# Patient Record
Sex: Male | Born: 1945 | Race: White | Hispanic: No | Marital: Married | Smoking: Former smoker
Health system: Southern US, Community
[De-identification: ages and names within clinical notes are randomized; demographics above are authoritative.]

## PROBLEM LIST (undated history)

## (undated) DIAGNOSIS — E119 Type 2 diabetes mellitus without complications: Secondary | ICD-10-CM

## (undated) DIAGNOSIS — G629 Polyneuropathy, unspecified: Secondary | ICD-10-CM

## (undated) DIAGNOSIS — T753XXA Motion sickness, initial encounter: Secondary | ICD-10-CM

## (undated) DIAGNOSIS — R339 Retention of urine, unspecified: Secondary | ICD-10-CM

## (undated) DIAGNOSIS — N529 Male erectile dysfunction, unspecified: Secondary | ICD-10-CM

## (undated) DIAGNOSIS — N400 Enlarged prostate without lower urinary tract symptoms: Secondary | ICD-10-CM

## (undated) DIAGNOSIS — I10 Essential (primary) hypertension: Secondary | ICD-10-CM

## (undated) HISTORY — DX: Male erectile dysfunction, unspecified: N52.9

## (undated) HISTORY — PX: CATARACT EXTRACTION: SUR2

## (undated) HISTORY — DX: Essential (primary) hypertension: I10

## (undated) HISTORY — PX: FRACTURE SURGERY: SHX138

## (undated) HISTORY — PX: BASAL CELL CARCINOMA EXCISION: SHX1214

## (undated) HISTORY — DX: Type 2 diabetes mellitus without complications: E11.9

## (undated) HISTORY — PX: EYE SURGERY: SHX253

---
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
## 2011-03-14 LAB — HM COLONOSCOPY

## 2011-03-16 ENCOUNTER — Ambulatory Visit: Payer: Self-pay | Admitting: Family Medicine

## 2011-09-27 ENCOUNTER — Ambulatory Visit: Payer: Self-pay | Admitting: Ophthalmology

## 2011-10-02 ENCOUNTER — Ambulatory Visit: Payer: Self-pay | Admitting: Ophthalmology

## 2013-02-20 HISTORY — PX: RETINAL DETACHMENT SURGERY: SHX105

## 2013-03-10 ENCOUNTER — Ambulatory Visit: Payer: Self-pay | Admitting: Ophthalmology

## 2013-03-11 ENCOUNTER — Ambulatory Visit: Payer: Self-pay | Admitting: Ophthalmology

## 2013-12-24 ENCOUNTER — Encounter: Payer: Self-pay | Admitting: General Surgery

## 2013-12-26 ENCOUNTER — Encounter: Payer: Self-pay | Admitting: *Deleted

## 2013-12-31 ENCOUNTER — Ambulatory Visit (INDEPENDENT_AMBULATORY_CARE_PROVIDER_SITE_OTHER): Payer: Medicare Other | Admitting: General Surgery

## 2013-12-31 ENCOUNTER — Encounter: Payer: Self-pay | Admitting: General Surgery

## 2013-12-31 VITALS — BP 134/70 | Ht 73.0 in | Wt 211.0 lb

## 2013-12-31 DIAGNOSIS — K4090 Unilateral inguinal hernia, without obstruction or gangrene, not specified as recurrent: Secondary | ICD-10-CM

## 2013-12-31 NOTE — Patient Instructions (Addendum)

## 2013-12-31 NOTE — Progress Notes (Signed)
Patient ID: Tyler Morgan, male   DOB: Mar 02, 1945, 68 y.o.   MRN: [id number]  Chief Complaint  Patient presents with  . Hernia    right inguinal    HPI Tyler Morgan is a 68 y.o. male.  Here for evaluation of right inguinal hernia. States he fell between floor joist about 3-4 months ago. He states that is when he noticed the bulge in the groin area. He also has some swelling in his right leg.  HPI  History reviewed. No pertinent past medical history.  Past Surgical History  Procedure Laterality Date  . Eye surgery Bilateral [id number]  . Tonsillectomy  1995  . Cataract extraction Bilateral 1986, 2014  . Retinal detachment surgery  Jan 2015  . Fracture surgery Right     as a child/ right arm    Family History  Problem Relation Age of Onset  . Cancer Father     colon    Social History History  Substance Use Topics  . Smoking status: Former Smoker -- 1.00 packs/day for 13 years    Types: Cigarettes    Quit date: 02/21/1984  . Smokeless tobacco: Never Used  . Alcohol Use: 0.0 oz/week    0 Not specified per week    Allergies  Allergen Reactions  . Acetaminophen Swelling    Swelling of liver  swelling     Current Outpatient Prescriptions  Medication Sig Dispense Refill  . amLODipine (NORVASC) 5 MG tablet Take 5 mg by mouth daily.     Marland Kitchen atenolol (TENORMIN) 100 MG tablet Take 100 mg by mouth daily.     . benazepril (LOTENSIN) 40 MG tablet Take 40 mg by mouth daily.     . calcium carbonate (OS-CAL) 600 MG TABS tablet Take 600 mg by mouth 2 (two) times daily with a meal.    . Cholecalciferol (VITAMIN D-3) 5000 UNITS TABS Take by mouth daily.     Marland Kitchen ibuprofen (ADVIL,MOTRIN) 100 MG tablet Take 100 mg by mouth every 6 (six) hours as needed for fever.    . Multiple Vitamins-Minerals (MULTIVITAMIN WITH MINERALS) tablet Take 1 tablet by mouth daily.     No current facility-administered medications for this visit.    Review of Systems Review of Systems   Constitutional: Negative.   Respiratory: Negative.   Cardiovascular: Negative.     Blood pressure 134/70, height 6\' 1"  (1.854 m), weight 211 lb (95.709 kg).  Physical Exam Physical Exam  Constitutional: He is oriented to person, place, and time. He appears well-developed and well-nourished.  Eyes: Conjunctivae are normal. No scleral icterus.  Neck: Neck supple.  Cardiovascular: Normal rate, regular rhythm and normal heart sounds.   Pulmonary/Chest: Effort normal and breath sounds normal.  Abdominal: Soft. Normal appearance and bowel sounds are normal. There is no hepatomegaly. There is no tenderness. A hernia (small to medium sized, reducible right inguinal hernia) is present. Hernia confirmed positive in the right inguinal area.  Neurological: He is alert and oriented to person, place, and time.  Skin: Skin is warm and dry.    Data Reviewed Notes reviewed  Assessment    Right inguinal hernia      Plan    Discussed right inguinal hernia repair. Procedure, open vs laparoscopy approaches, risks and benefits explained. Patient agrees. He would prefer open repair with MAC  Patient's surgery has been scheduled for 01-13-14 at [facility].  PCP:  Lattie Haw 12/31/2013, 12:57 PM

## 2014-01-05 ENCOUNTER — Ambulatory Visit: Payer: Self-pay | Admitting: General Surgery

## 2014-01-05 ENCOUNTER — Encounter: Payer: Self-pay | Admitting: General Surgery

## 2014-01-05 LAB — BASIC METABOLIC PANEL
Anion Gap: 5 — ABNORMAL LOW (ref 7–16)
BUN: 18 mg/dL (ref 7–18)
CALCIUM: 8.1 mg/dL — AB (ref 8.5–10.1)
CREATININE: 0.75 mg/dL (ref 0.60–1.30)
Chloride: 107 mmol/L (ref 98–107)
Co2: 28 mmol/L (ref 21–32)
EGFR (African American): >60
EGFR (Non-African Amer.): >60
Glucose: 173 mg/dL — ABNORMAL HIGH (ref 65–99)
Osmolality: 285 (ref 275–301)
Potassium: 4 mmol/L (ref 3.5–5.1)
Sodium: 140 mmol/L (ref 136–145)

## 2014-01-05 LAB — CBC WITH DIFFERENTIAL/PLATELET
Basophil #: 0 10*3/uL (ref 0.0–0.1)
Basophil %: 0.7 %
EOS ABS: 0.5 10*3/uL (ref 0.0–0.7)
EOS PCT: 8.8 %
HCT: 43.6 % (ref 40.0–52.0)
HGB: 14.8 g/dL (ref 13.0–18.0)
LYMPHS ABS: 1.4 10*3/uL (ref 1.0–3.6)
Lymphocyte %: 25.3 %
MCH: 29.1 pg (ref 26.0–34.0)
MCHC: 33.8 g/dL (ref 32.0–36.0)
MCV: 86 fL (ref 80–100)
Monocyte #: 0.4 x10 3/mm (ref 0.2–1.0)
Monocyte %: 7.6 %
NEUTROS ABS: 3.3 10*3/uL (ref 1.4–6.5)
Neutrophil %: 57.6 %
PLATELETS: 207 10*3/uL (ref 150–440)
RBC: 5.07 10*6/uL (ref 4.40–5.90)
RDW: 13.4 % (ref 11.5–14.5)
WBC: 5.7 10*3/uL (ref 3.8–10.6)

## 2014-01-13 ENCOUNTER — Ambulatory Visit: Payer: Self-pay | Admitting: General Surgery

## 2014-01-13 DIAGNOSIS — K4090 Unilateral inguinal hernia, without obstruction or gangrene, not specified as recurrent: Secondary | ICD-10-CM

## 2014-01-13 HISTORY — PX: HERNIA REPAIR: SHX51

## 2014-01-14 ENCOUNTER — Encounter: Payer: Self-pay | Admitting: General Surgery

## 2014-01-27 ENCOUNTER — Ambulatory Visit: Payer: Medicare Other | Admitting: General Surgery

## 2014-01-27 ENCOUNTER — Ambulatory Visit (INDEPENDENT_AMBULATORY_CARE_PROVIDER_SITE_OTHER): Payer: Self-pay | Admitting: General Surgery

## 2014-01-27 ENCOUNTER — Encounter: Payer: Self-pay | Admitting: General Surgery

## 2014-01-27 VITALS — BP 132/82 | HR 70 | Resp 16 | Ht 73.0 in | Wt 211.0 lb

## 2014-01-27 DIAGNOSIS — K4090 Unilateral inguinal hernia, without obstruction or gangrene, not specified as recurrent: Secondary | ICD-10-CM

## 2014-01-27 NOTE — Patient Instructions (Addendum)
The patient is aware to call back for any questions or concerns. Gradually increase activity. Follow up in one month.

## 2014-01-27 NOTE — Progress Notes (Signed)
Here today for postoperative visit, right inguinal hernia 01-13-14. States he is doing well. Bowels area regular. Using ibuprofen for pain.  Incision clean and intact. The repair is intact, abdomen is soft.   Gradually increase activity. Follow up in one month.

## 2014-01-29 ENCOUNTER — Encounter: Payer: Self-pay | Admitting: General Surgery

## 2014-03-02 ENCOUNTER — Ambulatory Visit (INDEPENDENT_AMBULATORY_CARE_PROVIDER_SITE_OTHER): Payer: Self-pay | Admitting: General Surgery

## 2014-03-02 ENCOUNTER — Encounter: Payer: Self-pay | Admitting: General Surgery

## 2014-03-02 VITALS — BP 126/74 | HR 74 | Resp 12 | Ht 74.0 in | Wt 207.0 lb

## 2014-03-02 DIAGNOSIS — K4090 Unilateral inguinal hernia, without obstruction or gangrene, not specified as recurrent: Secondary | ICD-10-CM

## 2014-03-02 NOTE — Progress Notes (Signed)
Here today for postoperative visit, right inguinal hernia 01-13-14. States he is doing well. Bowels area regular.  Incision clean and intact. The repair is intact, abdomen is soft.  Patient to return as needed. No restrictions.

## 2014-03-02 NOTE — Patient Instructions (Signed)
Patient to return as needed. Proper lifting techniques reviewed. 

## 2014-05-13 DIAGNOSIS — I10 Essential (primary) hypertension: Secondary | ICD-10-CM | POA: Diagnosis not present

## 2014-05-13 DIAGNOSIS — Z0000 Encounter for general adult medical examination without abnormal findings: Secondary | ICD-10-CM | POA: Diagnosis not present

## 2014-05-13 DIAGNOSIS — Z23 Encounter for immunization: Secondary | ICD-10-CM | POA: Diagnosis not present

## 2014-05-13 DIAGNOSIS — G64 Other disorders of peripheral nervous system: Secondary | ICD-10-CM | POA: Diagnosis not present

## 2014-05-13 DIAGNOSIS — Z125 Encounter for screening for malignant neoplasm of prostate: Secondary | ICD-10-CM | POA: Diagnosis not present

## 2014-05-13 DIAGNOSIS — E119 Type 2 diabetes mellitus without complications: Secondary | ICD-10-CM | POA: Diagnosis not present

## 2014-06-09 NOTE — Op Note (Signed)
PATIENT NAME:  Tyler Morgan, Tyler Morgan MR#:  [id number] DATE OF BIRTH:  02-Sep-1945  DATE OF PROCEDURE:  10/02/2011  PREOPERATIVE DIAGNOSIS:  Cataract, right eye.   POSTOPERATIVE DIAGNOSIS:  Cataract, right eye.  PROCEDURE PERFORMED:  Extracapsular cataract extraction using phacoemulsification with placement of an Alcon SN6CWS, 10.5-diopter posterior chamber lens, serial # [id number].  SURGEON:  Loura Back. Demauri Advincula, MD  ASSISTANT:  None.  ANESTHESIA:  4% lidocaine and 0.75% Marcaine in a 50/50 mixture with 10 units/mL of Hylenex added, given as a peribulbar.  ANESTHESIOLOGIST:  Dr. Kayleen Memos.   COMPLICATIONS:  None.  ESTIMATED BLOOD LOSS:  Less than 1 mL.  DESCRIPTION OF PROCEDURE:  The patient was brought to the operating room and given a peribulbar block.  The patient was then prepped and draped in the usual fashion.  The vertical rectus muscles were imbricated using 5-0 silk sutures.  These sutures were then clamped to the sterile drapes as bridle sutures.  A limbal peritomy was performed extending two clock hours and hemostasis was obtained with cautery.  A partial thickness scleral groove was made at the surgical limbus and dissected anteriorly in a lamellar dissection using an Alcon crescent knife.  The anterior chamber was entered superonasally with a Superblade and through the lamellar dissection with a 2.6 mm keratome.  DisCoVisc was used to replace the aqueous and a continuous tear capsulorrhexis was carried out.  Hydrodissection and hydrodelineation were carried out with balanced salt and a 27 gauge canula.  The nucleus was rotated to confirm the effectiveness of the hydrodissection.  Phacoemulsification was carried out using a divide-and-conquer technique.  Total ultrasound time was 4 minutes with an average power of 26.4 percent. CDE 95.33.  Irrigation/aspiration was used to remove the residual cortex.  DisCoVisc was used to inflate the capsule and the internal incision was enlarged  to 3 mm with the crescent knife.  The intraocular lens was folded and inserted into the capsular bag using the AcrySert delivery system.  Irrigation/aspiration was used to remove the residual DisCoVisc.  Miostat was injected into the anterior chamber through the paracentesis track to inflate the anterior chamber and induce miosis.  The wound was checked for leaks and none were found. The conjunctiva was closed with cautery and the bridle sutures were removed.  Two drops of 0.3% Vigamox were placed on the eye.   An eye shield was placed on the eye.  The patient was discharged to the recovery room in good condition.  ____________________________ Loura Back Ishitha Roper, MD sad:cms D: 10/02/2011 12:33:46 ET T: 10/02/2011 12:52:17 ET JOB#: [id number]  cc: Remo Lipps A. Emalina Dubreuil, MD, <Dictator> Martie Lee MD ELECTRONICALLY SIGNED 10/09/2011 13:51

## 2014-06-13 NOTE — Op Note (Signed)
PATIENT NAME:  Tyler Morgan, Tyler Morgan MR#:  [id number] DATE OF BIRTH:  [birth year]  DATE OF PROCEDURE:  01/13/2014  PREOPERATIVE DIAGNOSIS: Right inguinal hernia.   POSTOPERATIVE DIAGNOSIS: Right inguinal hernia, indirect with lipoma of cord.   OPERATION: Repair right inguinal hernia with mesh.   ANESTHESIA: Local with sedation 30 mL of 0.5% Marcaine mixed with 1% Xylocaine used.   COMPLICATIONS: None.   ESTIMATED BLOOD LOSS: Less than 20 mL.   DRAINS: None.   DESCRIPTION OF PROCEDURE: The patient was placed in the supine position on the operating table. With adequate sedation and monitoring, the right inguinal area was prepped and draped out as a sterile field. Timeout was performed. A local anesthetic was instilled along the inguinal canal, and incision made along the medial two-thirds. This was then dissected down to the external oblique and bleeding controlled with cautery and ligatures of 3-0 Vicryl. The inguinal canal was instilled local anesthetic and allowed to spread evenly. The external oblique was opened along the line of its fibers through the external ring area. The ilioinguinal nerve was identified, freed, and preserved. A thick cord was identified, which was then dissected free from the posterior wall and lifted out and controlled with a Penrose drain. The fascia covering the cord was then opened longitudinally and there were 2 findings, one was an indirect sac and the other lipomatous mass. The lipoma was freed all the way down to the internal ring area where it was clamped, amputated, and ligated with 2-0 Vicryl. The sac was dissected free all the way down to the internal ring and beyond to allow for high ligation. It was opened, no contents were noted. Under direct vision, a suture ligation at a high level of the sac was performed and the sac amputated. The ligating suture was used to transfix this to the undersurface of the arching fibers of the internal oblique. Following this, the  fascia covering the cord structures was reapproximated with interrupted 2-0 Vicryl stitches. The posterior wall was satisfactorily delineated. The inguinal nerve was brought up along with the cord and a Parietex ProGrip mesh was then placed around the cord and laid down to cover the posterior wall and to reinforce the repair. The medial end was tacked to the pubic tubercle with a 2-0 PDS stitch. The area was irrigated and the lateral end of the mesh was tucked under the external oblique. The external oblique was closed with running 2-0 PDS. The subcutaneous tissue was closed with 3-0 Vicryl, and the skin closed with subcuticular 4-0 Vicryl covered with LiquiBand. Patient subsequently was returned to the recovery room in stable condition    ____________________________ S.Robinette Haines, MD sgs:bm D: 01/13/2014 17:52:52 ET T: 01/13/2014 23:26:05 ET JOB#: [id number]  cc: S.G. Jamal Collin, MD, <Dictator> [facility] Robinette Haines MD ELECTRONICALLY SIGNED 01/14/2014 19:28

## 2014-06-13 NOTE — Op Note (Signed)
PATIENT NAME:  Tyler Morgan, Tyler Morgan MR#:  [id number] DATE OF BIRTH:  1946-01-16  DATE OF PROCEDURE:  03/11/2013  PROCEDURE PERFORMED: 1.  Pars plana vitrectomy of the right eye.  2.  Gas exchange of the right eye.  3.  Endolaser of the right eye.   PREOPERATIVE DIAGNOSES: Rhegmatogenous retinal detachment of the right eye.   POSTOPERATIVE DIAGNOSIS: Rhegmatogenous retinal detachment of the right eye.  ESTIMATED BLOOD LOSS: Less than 1 mL.  PRIMARY SURGEON: Garlan Fair, M.D.   ANESTHESIA: Retrobulbar block of the right eye with monitored anesthesia care.   COMPLICATIONS: None.   INDICATIONS FOR PROCEDURE: The patient was in my office with a curtain in his right eye. Examination revealed a rhegmatogenous retinal detachment of the right eye. The risks, benefits, and alternatives of the above procedure were discussed and the patient wished to proceed.   DETAILS OF PROCEDURE: After informed consent was obtained, the patient was brought to the operative suite at [facility]. The patient was placed in supine position, was given a small dose of Alfenta and a retrobulbar block was performed on the right eye by the primary surgeon without any complications. The right eye was prepped and draped in sterile manner. After lid speculum was inserted, a 25-gauge trocar was placed inferotemporally through displaced conjunctiva in an oblique fashion 3 mm beyond the limbus. The infusion cannula was turned on and inserted through the trocar and secured in position with Steri-Strips. Two more trocars were placed in a similar fashion superotemporally and superonasally. The vitreous cutter and light pipe were introduced into the eye and a core vitrectomy was performed. Peripheral vitreous was trimmed for 360 degrees with care taken over the area of retinal detachment. A single retinal tear associated with a retinal detachment was identified at approximately 1:00. The flap of this tear was  amputated and peripheral vitreous was trimmed. Another tear was identified without subretinal fluid at approximately 4:30. This was marked using Endo cautery. The original retinal tear was marked using Endo cautery. A posterior draining retinotomy was created at approximately 1:30. The endolaser was introduced and a ring of 3 rows of the laser was placed just posterior to the ora serrata for the area of A patch retina. An air-fluid exchange was performed through the draining retinotomy and the retina completely flattened. Remnant fluid was removed from the posterior. Four rows of laser were placed around the original retinal tear and laser was carried for 3 rows posterior to the ora serrata for the rest of the area of retinal detachment. Any remnant collected fluid was removed from the posterior draining retinotomy, and 3 to 4 rows of laser was placed around that. 24% SF6 was used as an air gas exchange and the trocars were removed. The wounds were noted to be airtight. Pressure in the eye was confirmed to be approximately 15 mmHg. Dexamethasone 5 mg was given into the inferior fornix. The lid speculum was removed and the eye was cleaned. TobraDex was placed in the eye and a patch and shield was placed over the eye. The patient was taken to postanesthesia care with instructions to remain right side down.    ____________________________ Teresa Pelton. Starling Manns, MD mfa:aw D: 03/11/2013 08:03:02 ET T: 03/11/2013 11:14:23 ET JOB#: [id number]  cc: Teresa Pelton. Starling Manns, MD, <Dictator> Coralee Rud MD ELECTRONICALLY SIGNED 03/12/2013 7:11

## 2014-10-01 DIAGNOSIS — L821 Other seborrheic keratosis: Secondary | ICD-10-CM | POA: Diagnosis not present

## 2014-10-01 DIAGNOSIS — D485 Neoplasm of uncertain behavior of skin: Secondary | ICD-10-CM | POA: Diagnosis not present

## 2014-10-01 DIAGNOSIS — L918 Other hypertrophic disorders of the skin: Secondary | ICD-10-CM | POA: Diagnosis not present

## 2014-10-01 DIAGNOSIS — B078 Other viral warts: Secondary | ICD-10-CM | POA: Diagnosis not present

## 2014-10-01 DIAGNOSIS — D2372 Other benign neoplasm of skin of left lower limb, including hip: Secondary | ICD-10-CM | POA: Diagnosis not present

## 2014-11-03 ENCOUNTER — Other Ambulatory Visit: Payer: Self-pay | Admitting: Family Medicine

## 2014-11-03 NOTE — Telephone Encounter (Signed)
Needs an appointment. Will get him enough medicine to make it to appointment when it's booked.   

## 2014-11-12 DIAGNOSIS — B078 Other viral warts: Secondary | ICD-10-CM | POA: Diagnosis not present

## 2014-11-12 DIAGNOSIS — Z789 Other specified health status: Secondary | ICD-10-CM | POA: Diagnosis not present

## 2014-11-12 DIAGNOSIS — D485 Neoplasm of uncertain behavior of skin: Secondary | ICD-10-CM | POA: Diagnosis not present

## 2014-12-11 DIAGNOSIS — I152 Hypertension secondary to endocrine disorders: Secondary | ICD-10-CM | POA: Insufficient documentation

## 2014-12-11 DIAGNOSIS — I10 Essential (primary) hypertension: Secondary | ICD-10-CM | POA: Insufficient documentation

## 2014-12-15 ENCOUNTER — Ambulatory Visit (INDEPENDENT_AMBULATORY_CARE_PROVIDER_SITE_OTHER): Payer: Medicare Other | Admitting: Family Medicine

## 2014-12-15 ENCOUNTER — Encounter: Payer: Self-pay | Admitting: Family Medicine

## 2014-12-15 ENCOUNTER — Telehealth: Payer: Self-pay | Admitting: Family Medicine

## 2014-12-15 VITALS — BP 114/74 | HR 64 | Temp 97.7°F | Ht 71.6 in | Wt 198.0 lb

## 2014-12-15 DIAGNOSIS — I10 Essential (primary) hypertension: Secondary | ICD-10-CM | POA: Diagnosis not present

## 2014-12-15 DIAGNOSIS — E119 Type 2 diabetes mellitus without complications: Secondary | ICD-10-CM

## 2014-12-15 DIAGNOSIS — E1140 Type 2 diabetes mellitus with diabetic neuropathy, unspecified: Secondary | ICD-10-CM | POA: Insufficient documentation

## 2014-12-15 DIAGNOSIS — Z23 Encounter for immunization: Secondary | ICD-10-CM | POA: Diagnosis not present

## 2014-12-15 DIAGNOSIS — M6281 Muscle weakness (generalized): Secondary | ICD-10-CM

## 2014-12-15 DIAGNOSIS — E1349 Other specified diabetes mellitus with other diabetic neurological complication: Secondary | ICD-10-CM | POA: Insufficient documentation

## 2014-12-15 DIAGNOSIS — E1365 Other specified diabetes mellitus with hyperglycemia: Secondary | ICD-10-CM

## 2014-12-15 LAB — BAYER DCA HB A1C WAIVED: HB A1C: 6.4 % (ref <7.0)

## 2014-12-15 MED ORDER — BENAZEPRIL HCL 40 MG PO TABS: 40.0000 mg | ORAL_TABLET | Freq: Every day | ORAL | Status: DC

## 2014-12-15 MED ORDER — ATENOLOL 100 MG PO TABS: 100.0000 mg | ORAL_TABLET | Freq: Every day | ORAL | Status: DC

## 2014-12-15 MED ORDER — HYDROCHLOROTHIAZIDE 25 MG PO TABS: 25.0000 mg | ORAL_TABLET | Freq: Every day | ORAL | Status: DC

## 2014-12-15 MED ORDER — METFORMIN HCL 500 MG PO TABS: 500.0000 mg | ORAL_TABLET | Freq: Every day | ORAL | Status: DC

## 2014-12-15 NOTE — Assessment & Plan Note (Addendum)
Discuss with chart review vitamin levels from this spring and blood work was negative including STS Will get lumbar x-ray if normal or if indicated MRI of lumbar spine If no evidence of spinal stenosis will refer to neurology, or neurosurgery if spinal stenosis Concerned about lumbar fracture with history of fall. Check BMP Cautioned patient about falling and coordination

## 2014-12-15 NOTE — Assessment & Plan Note (Signed)
The current medical regimen is effective;  continue present plan and medications.  

## 2014-12-15 NOTE — Progress Notes (Signed)
BP 114/74 mmHg  Pulse 64  Temp(Src) 97.7 F (36.5 C)  Ht 5' 11.6" (1.819 m)  Wt 198 lb (89.812 kg)  BMI 27.14 kg/m2  SpO2 97%   Subjective:    Patient ID: Tyler Morgan, male    DOB: 06-Sep-1945, 69 y.o.   MRN: [id number]  HPI: DENO SIDA is a 69 y.o. male  Chief Complaint  Patient presents with  . Diabetes   Patient doing fine from diabetes point of view with no complaints noted low blood sugar spells and doing well. Blood pressure no complaints doing well.  On chart review and further discussion with patient bilateral calf weakness which was brought up that last visit has not changed. Patient still with bilateral calf weakness came raise up on his toes. Is recommended the patient did not go to physical therapy there's been no improvement or worsening symptoms. There is no numbness or swelling complaints. There is no complaints of back pain, or radicular pain. Of significance on review of our chart RPR, X32, folic acid and other labs were negative for causes of neuropathy. Patient relates symptoms may have started around the time of this hernia surgery. Reviewed surgical notes from November 2015 with standard inguinal hernia repairsignificant associated. A significant note though is 3 months prior to surgery patient fell through the rafters which subsequent caused his hernia. There is no noted complaints of back pain.  Relevant past medical, surgical, family and social history reviewed and updated as indicated. Interim medical history since our last visit reviewed. Allergies and medications reviewed and updated.  Review of Systems  Constitutional: Negative.   HENT: Negative.   Eyes: Negative.   Respiratory: Negative.   Cardiovascular: Negative.   Gastrointestinal: Negative.   Endocrine: Negative.   Genitourinary: Negative for dysuria, frequency, hematuria, flank pain, scrotal swelling, enuresis, difficulty urinating and testicular pain.  Musculoskeletal:  Positive for gait problem. Negative for myalgias, back pain, joint swelling and arthralgias.  Skin: Negative.   Neurological: Negative for dizziness, tremors, seizures, syncope, facial asymmetry, light-headedness, numbness and headaches.  Psychiatric/Behavioral: Negative.     Per HPI unless specifically indicated above     Objective:    BP 114/74 mmHg  Pulse 64  Temp(Src) 97.7 F (36.5 C)  Ht 5' 11.6" (1.819 m)  Wt 198 lb (89.812 kg)  BMI 27.14 kg/m2  SpO2 97%  Wt Readings from Last 3 Encounters:  12/15/14 198 lb (89.812 kg)  05/13/14 202 lb (91.627 kg)  03/02/14 207 lb (93.895 kg)    Physical Exam  Constitutional: He is oriented to person, place, and time. He appears well-developed and well-nourished. No distress.  HENT:  Head: Normocephalic and atraumatic.  Right Ear: Hearing normal.  Left Ear: Hearing normal.  Nose: Nose normal.  Mouth/Throat: Oropharynx is clear and moist.  Eyes: Conjunctivae and lids are normal. Right eye exhibits no discharge. Left eye exhibits no discharge. No scleral icterus.  Neck: Normal range of motion. Neck supple. No thyromegaly present.  Cardiovascular: Normal rate, regular rhythm and normal heart sounds.   Pulmonary/Chest: Effort normal. No respiratory distress. He has no rales.  Abdominal: Soft. Bowel sounds are normal. He exhibits no distension. There is no tenderness.  Musculoskeletal: Normal range of motion.  Straight leg raising normal with no symptoms Normal quadriceps strength Markedly deficient calf muscle unable to stand on tiptoes either with both feet are 1 foot With Babinski patient fall slightly to the right  Lymphadenopathy:    He has no cervical adenopathy.  Neurological: He is alert and oriented to person, place, and time. No cranial nerve deficit.  Cranial nerves intact Patellar reflexes normal   Skin: Skin is intact. No rash noted.  Psychiatric: He has a normal mood and affect. His speech is normal and behavior is  normal. Judgment and thought content normal. Cognition and memory are normal.        Assessment & Plan:   Problem List Items Addressed This Visit      Cardiovascular and Mediastinum   Hypertension    The current medical regimen is effective;  continue present plan and medications.       Relevant Medications   atenolol (TENORMIN) 100 MG tablet   benazepril (LOTENSIN) 40 MG tablet   hydrochlorothiazide (HYDRODIURIL) 25 MG tablet   Other Relevant Orders   Basic metabolic panel     Endocrine   DM type 2 (diabetes mellitus, type 2) ([place])    The current medical regimen is effective;  continue present plan and medications.       Relevant Medications   benazepril (LOTENSIN) 40 MG tablet   metFORMIN (GLUCOPHAGE) 500 MG tablet   Other Relevant Orders   Bayer DCA Hb A1c Waived     Other   Calf muscle weakness    Discuss with chart review vitamin levels from this spring and blood work was negative including STS Will get lumbar x-ray if normal or if indicated MRI of lumbar spine If no evidence of spinal stenosis will refer to neurology, or neurosurgery if spinal stenosis Concerned about lumbar fracture with history of fall. Check BMP Cautioned patient about falling and coordination      Relevant Orders   DG Lumbar Spine Complete    Other Visit Diagnoses    Immunization due    -  Primary    Relevant Orders    Flu Vaccine QUAD 36+ mos PF IM (Fluarix & Fluzone Quad PF) (Completed)        Follow up plan: Return in about 6 months (around 06/15/2015) for Physical Exam and a1c.

## 2014-12-16 ENCOUNTER — Encounter: Payer: Self-pay | Admitting: Family Medicine

## 2014-12-16 LAB — BASIC METABOLIC PANEL
BUN / CREAT RATIO: 17 (ref 10–22)
BUN: 16 mg/dL (ref 8–27)
CHLORIDE: 100 mmol/L (ref 97–106)
CO2: 27 mmol/L (ref 18–29)
CREATININE: 0.92 mg/dL (ref 0.76–1.27)
Calcium: 9.2 mg/dL (ref 8.6–10.2)
GFR calc Af Amer: 98 mL/min/{1.73_m2} (ref >59)
GFR calc non Af Amer: 85 mL/min/{1.73_m2} (ref >59)
GLUCOSE: 138 mg/dL — AB (ref 65–99)
POTASSIUM: 4 mmol/L (ref 3.5–5.2)
SODIUM: 141 mmol/L (ref 136–144)

## 2015-04-22 ENCOUNTER — Telehealth: Payer: Self-pay

## 2015-04-22 DIAGNOSIS — E119 Type 2 diabetes mellitus without complications: Secondary | ICD-10-CM

## 2015-04-22 MED ORDER — METFORMIN HCL 500 MG PO TABS: 500.0000 mg | ORAL_TABLET | Freq: Two times a day (BID) | ORAL | Status: DC

## 2015-04-22 NOTE — Telephone Encounter (Signed)
[facility] requesting 90 day RX for  Metformin HCL 500MG  TAb BID  To help with patient compliance with meds

## 2015-05-14 ENCOUNTER — Telehealth: Payer: Self-pay

## 2015-05-14 DIAGNOSIS — I10 Essential (primary) hypertension: Secondary | ICD-10-CM

## 2015-05-14 NOTE — Telephone Encounter (Signed)
Pharmacy sent a fax asking if they could get a 90 day prescription for patient's benazepril 40 mg tablet to help with compliance. Pharmacy is [facility].

## 2015-05-17 MED ORDER — BENAZEPRIL HCL 40 MG PO TABS: 40.0000 mg | ORAL_TABLET | Freq: Every day | ORAL | Status: DC

## 2015-05-19 ENCOUNTER — Encounter: Payer: Medicare Other | Admitting: Family Medicine

## 2015-07-21 ENCOUNTER — Ambulatory Visit (INDEPENDENT_AMBULATORY_CARE_PROVIDER_SITE_OTHER): Payer: Medicare Other | Admitting: Family Medicine

## 2015-07-21 ENCOUNTER — Encounter: Payer: Self-pay | Admitting: Family Medicine

## 2015-07-21 VITALS — BP 133/83 | HR 64 | Temp 98.0°F | Ht 72.0 in | Wt 203.0 lb

## 2015-07-21 DIAGNOSIS — M6281 Muscle weakness (generalized): Secondary | ICD-10-CM

## 2015-07-21 DIAGNOSIS — Z0000 Encounter for general adult medical examination without abnormal findings: Secondary | ICD-10-CM

## 2015-07-21 DIAGNOSIS — E119 Type 2 diabetes mellitus without complications: Secondary | ICD-10-CM | POA: Diagnosis not present

## 2015-07-21 DIAGNOSIS — Z23 Encounter for immunization: Secondary | ICD-10-CM | POA: Diagnosis not present

## 2015-07-21 DIAGNOSIS — I10 Essential (primary) hypertension: Secondary | ICD-10-CM | POA: Diagnosis not present

## 2015-07-21 DIAGNOSIS — Z13818 Encounter for screening for other digestive system disorders: Secondary | ICD-10-CM | POA: Diagnosis not present

## 2015-07-21 LAB — URINALYSIS, ROUTINE W REFLEX MICROSCOPIC
BILIRUBIN UA: NEGATIVE
KETONES UA: NEGATIVE
Leukocytes, UA: NEGATIVE
Nitrite, UA: NEGATIVE
Protein, UA: NEGATIVE
RBC UA: NEGATIVE
SPEC GRAV UA: 1.02 (ref 1.005–1.030)
UUROB: 0.2 mg/dL (ref 0.2–1.0)
pH, UA: 5.5 (ref 5.0–7.5)

## 2015-07-21 LAB — HEMOGLOBIN A1C: HEMOGLOBIN A1C: 8.2

## 2015-07-21 LAB — BAYER DCA HB A1C WAIVED: HB A1C: 8.2 % — AB (ref <7.0)

## 2015-07-21 MED ORDER — ATENOLOL 100 MG PO TABS
100.0000 mg | ORAL_TABLET | Freq: Every day | ORAL | Status: DC
Start: 2015-07-21 — End: 2016-07-18

## 2015-07-21 MED ORDER — HYDROCHLOROTHIAZIDE 25 MG PO TABS: 25.0000 mg | ORAL_TABLET | Freq: Every day | ORAL | Status: DC

## 2015-07-21 MED ORDER — METFORMIN HCL 500 MG PO TABS: 1000.0000 mg | ORAL_TABLET | Freq: Two times a day (BID) | ORAL | Status: DC

## 2015-07-21 MED ORDER — BENAZEPRIL HCL 40 MG PO TABS: 40.0000 mg | ORAL_TABLET | Freq: Every day | ORAL | Status: DC

## 2015-07-21 NOTE — Patient Instructions (Signed)
Pneumococcal Polysaccharide Vaccine: What You Need to Know  1. Why get vaccinated?  Vaccination can protect older adults (and some children and younger adults) from pneumococcal disease.  Pneumococcal disease is caused by bacteria that can spread from person to person through close contact. It can cause ear infections, and it can also lead to more serious infections of the:   · Lungs (pneumonia),  · Blood (bacteremia), and  · Covering of the brain and spinal cord (meningitis). Meningitis can cause deafness and brain damage, and it can be fatal.  Anyone can get pneumococcal disease, but children under 2 years of age, people with certain medical conditions, adults over 65 years of age, and cigarette smokers are at the highest risk.  About 18,000 older adults die each year from pneumococcal disease in the [place].  Treatment of pneumococcal infections with penicillin and other drugs used to be more effective. But some strains of the disease have become resistant to these drugs. This makes prevention of the disease, through vaccination, even more important.  2. Pneumococcal polysaccharide vaccine (PPSV23)  Pneumococcal polysaccharide vaccine (PPSV23) protects against 23 types of pneumococcal bacteria. It will not prevent all pneumococcal disease.  PPSV23 is recommended for:  · All adults 65 years of age and older,  · Anyone 2 through 70 years of age with certain long-term health problems,  · Anyone 2 through 70 years of age with a weakened immune system,  · Adults 19 through 70 years of age who smoke cigarettes or have asthma.  Most people need only one dose of PPSV. A second dose is recommended for certain high-risk groups. People 65 and older should get a dose even if they have gotten one or more doses of the vaccine before they turned 65.  Your healthcare provider can give you more information about these recommendations.  Most healthy adults develop protection within 2 to 3 weeks of getting the shot.  3. Some  people should not get this vaccine  · Anyone who has had a life-threatening allergic reaction to PPSV should not get another dose.  · Anyone who has a severe allergy to any component of PPSV should not receive it. Tell your provider if you have any severe allergies.  · Anyone who is moderately or severely ill when the shot is scheduled may be asked to wait until they recover before getting the vaccine. Someone with a mild illness can usually be vaccinated.  · Children less than 2 years of age should not receive this vaccine.  · There is no evidence that PPSV is harmful to either a pregnant woman or to her fetus. However, as a precaution, women who need the vaccine should be vaccinated before becoming pregnant, if possible.  4. Risks of a vaccine reaction  With any medicine, including vaccines, there is a chance of side effects. These are usually mild and go away on their own, but serious reactions are also possible.  About half of people who get PPSV have mild side effects, such as redness or pain where the shot is given, which go away within about two days.  Less than 1 out of 100 people develop a fever, muscle aches, or more severe local reactions.  Problems that could happen after any vaccine:  · People sometimes faint after a medical procedure, including vaccination. Sitting or lying down for about 15 minutes can help prevent fainting, and injuries caused by a fall. Tell your doctor if you feel dizzy, or have vision changes or   ringing in the ears.  · Some people get severe pain in the shoulder and have difficulty moving the arm where a shot was given. This happens very rarely.  · Any medication can cause a severe allergic reaction. Such reactions from a vaccine are very rare, estimated at about 1 in a million doses, and would happen within a few minutes to a few hours after the vaccination.  As with any medicine, there is a very remote chance of a vaccine causing a serious injury or death.  The safety of  vaccines is always being monitored. For more information, visit: www.cdc.gov/vaccinesafety/  5. What if there is a serious reaction?  What should I look for?  Look for anything that concerns you, such as signs of a severe allergic reaction, very high fever, or unusual behavior.   Signs of a severe allergic reaction can include hives, swelling of the face and throat, difficulty breathing, a fast heartbeat, dizziness, and weakness. These would usually start a few minutes to a few hours after the vaccination.  What should I do?  If you think it is a severe allergic reaction or other emergency that can't wait, call 9-1-1 or get to the nearest hospital. Otherwise, call your doctor.  Afterward, the reaction should be reported to the Vaccine Adverse Event Reporting System (VAERS). Your doctor might file this report, or you can do it yourself through the VAERS web site at www.vaers.hhs.gov, or by calling 1-800-822-7967.   VAERS does not give medical advice.  6. How can I learn more?  · Ask your doctor. He or she can give you the vaccine package insert or suggest other sources of information.  · Call your local or state health department.  · Contact the Centers for Disease Control and Prevention (CDC):    Call 1-800-232-4636 (1-800-CDC-INFO) or    Visit CDC's website at www.cdc.gov/vaccines  CDC Pneumococcal Polysaccharide Vaccine VIS (06/13/13)     This information is not intended to replace advice given to you by your health care provider. Make sure you discuss any questions you have with your health care provider.     Document Released: 12/04/2005 Document Revised: 02/27/2014 Document Reviewed: 06/16/2013  Elsevier Interactive Patient Education ©2016 [employer]

## 2015-07-21 NOTE — Assessment & Plan Note (Signed)
Poor control of diabetes discussed diet exercise nutrition Will increase metformin to up to a thousand milligrams twice a day

## 2015-07-21 NOTE — Assessment & Plan Note (Signed)
The current medical regimen is effective;  continue present plan and medications.  

## 2015-07-21 NOTE — Assessment & Plan Note (Signed)
No further weakness or progression

## 2015-07-21 NOTE — Progress Notes (Signed)
BP 133/83 mmHg  Pulse 64  Temp(Src) 98 F (36.7 C)  Ht 6' (1.829 m)  Wt 203 lb (92.08 kg)  BMI 27.53 kg/m2  SpO2 97%   Subjective:    Patient ID: Tyler Morgan, male    DOB: 03/18/1945, 70 y.o.   MRN: [id number]  HPI: FREDERIK HARTSOCK is a 70 y.o. male  Chief Complaint  Patient presents with  . Annual Exam   Calf muscle weakness is stable may be better not because calf muscles are gotten stronger but other accommodation patient's balance issues are better. Diabetes no changes no issues no concerns noted low blood sugar spells no problems with medications. Blood pressure doing well no complaints from medication takes faithfully  Relevant past medical, surgical, family and social history reviewed and updated as indicated. Interim medical history since our last visit reviewed. Allergies and medications reviewed and updated.  Review of Systems  Constitutional: Negative.   HENT: Negative.   Eyes: Negative.   Respiratory: Negative.   Cardiovascular: Negative.   Gastrointestinal: Negative.   Endocrine: Negative.   Genitourinary: Negative.   Musculoskeletal: Negative.   Skin: Negative.   Allergic/Immunologic: Negative.   Neurological: Negative.   Hematological: Negative.   Psychiatric/Behavioral: Negative.     Per HPI unless specifically indicated above     Objective:    BP 133/83 mmHg  Pulse 64  Temp(Src) 98 F (36.7 C)  Ht 6' (1.829 m)  Wt 203 lb (92.08 kg)  BMI 27.53 kg/m2  SpO2 97%  Wt Readings from Last 3 Encounters:  07/21/15 203 lb (92.08 kg)  12/15/14 198 lb (89.812 kg)  05/13/14 202 lb (91.627 kg)    Physical Exam  Constitutional: He is oriented to person, place, and time. He appears well-developed and well-nourished.  HENT:  Head: Normocephalic and atraumatic.  Right Ear: External ear normal.  Left Ear: External ear normal.  Eyes: Conjunctivae and EOM are normal. Pupils are equal, round, and reactive to light.  Neck: Normal range of  motion. Neck supple.  Cardiovascular: Normal rate, regular rhythm, normal heart sounds and intact distal pulses.   Pulmonary/Chest: Effort normal and breath sounds normal.  Abdominal: Soft. Bowel sounds are normal. There is no splenomegaly or hepatomegaly.  Genitourinary: Rectum normal, prostate normal and penis normal.  Musculoskeletal: Normal range of motion.  Neurological: He is alert and oriented to person, place, and time. He has normal reflexes.  Skin: No rash noted. No erythema.  Psychiatric: He has a normal mood and affect. His behavior is normal. Judgment and thought content normal.    Results for orders placed or performed in visit on 12/15/14  Bayer DCA Hb A1c Waived  Result Value Ref Range   Bayer DCA Hb A1c Waived 6.4 Q000111Q %  Basic metabolic panel  Result Value Ref Range   Glucose 138 (H) 65 - 99 mg/dL   BUN 16 8 - 27 mg/dL   Creatinine, Ser 0.92 0.76 - 1.27 mg/dL   GFR calc non Af Amer 85 >59 mL/min/1.73   GFR calc Af Amer 98 >59 mL/min/1.73   BUN/Creatinine Ratio 17 10 - 22   Sodium 141 136 - 144 mmol/L   Potassium 4.0 3.5 - 5.2 mmol/L   Chloride 100 97 - 106 mmol/L   CO2 27 18 - 29 mmol/L   Calcium 9.2 8.6 - 10.2 mg/dL      Assessment & Plan:   Problem List Items Addressed This Visit      Cardiovascular and Mediastinum  Hypertension    The current medical regimen is effective;  continue present plan and medications.       Relevant Medications   atenolol (TENORMIN) 100 MG tablet   benazepril (LOTENSIN) 40 MG tablet   hydrochlorothiazide (HYDRODIURIL) 25 MG tablet     Endocrine   DM type 2 (diabetes mellitus, type 2) (H[facility])    Poor control of diabetes discussed diet exercise nutrition Will increase metformin to up to a thousand milligrams twice a day      Relevant Medications   benazepril (LOTENSIN) 40 MG tablet   metFORMIN (GLUCOPHAGE) 500 MG tablet     Other   Calf muscle weakness    No further weakness or progression       Other Visit  Diagnoses    Routine general medical examination at a health care facility    -  Primary    Relevant Orders    CBC with Differential/Platelet    Comprehensive metabolic panel    Lipid Panel w/o Chol/HDL Ratio    PSA    TSH    Urinalysis, Routine w reflex microscopic (not at [facility])    Diabetes mellitus without complication ([place])        Relevant Medications    benazepril (LOTENSIN) 40 MG tablet    metFORMIN (GLUCOPHAGE) 500 MG tablet    Other Relevant Orders    Bayer DCA Hb A1c Waived    Healthcare maintenance        Relevant Orders    Hepatitis C Antibody    Immunization due        Relevant Orders    Pneumococcal polysaccharide vaccine 23-valent greater than or equal to 2yo subcutaneous/IM (Completed)        Follow up plan: Return in about 3 months (around 10/21/2015) for a1c.

## 2015-07-22 ENCOUNTER — Telehealth: Payer: Self-pay | Admitting: Family Medicine

## 2015-07-22 DIAGNOSIS — R972 Elevated prostate specific antigen [PSA]: Secondary | ICD-10-CM

## 2015-07-22 LAB — CBC WITH DIFFERENTIAL/PLATELET
Basophils Absolute: 0 10*3/uL (ref 0.0–0.2)
Basos: 0 %
EOS (ABSOLUTE): 0.4 10*3/uL (ref 0.0–0.4)
EOS: 7 %
HEMATOCRIT: 44.4 % (ref 37.5–51.0)
HEMOGLOBIN: 14.6 g/dL (ref 12.6–17.7)
Immature Grans (Abs): 0 10*3/uL (ref 0.0–0.1)
Immature Granulocytes: 0 %
LYMPHS ABS: 1.3 10*3/uL (ref 0.7–3.1)
Lymphs: 24 %
MCH: 28.7 pg (ref 26.6–33.0)
MCHC: 32.9 g/dL (ref 31.5–35.7)
MCV: 87 fL (ref 79–97)
MONOCYTES: 8 %
Monocytes Absolute: 0.4 10*3/uL (ref 0.1–0.9)
NEUTROS ABS: 3.2 10*3/uL (ref 1.4–7.0)
Neutrophils: 61 %
Platelets: 188 10*3/uL (ref 150–379)
RBC: 5.08 x10E6/uL (ref 4.14–5.80)
RDW: 14.8 % (ref 12.3–15.4)
WBC: 5.3 10*3/uL (ref 3.4–10.8)

## 2015-07-22 LAB — COMPREHENSIVE METABOLIC PANEL
ALBUMIN: 4.4 g/dL (ref 3.6–4.8)
ALK PHOS: 51 IU/L (ref 39–117)
ALT: 24 IU/L (ref 0–44)
AST: 22 IU/L (ref 0–40)
Albumin/Globulin Ratio: 1.9 (ref 1.2–2.2)
BUN / CREAT RATIO: 18 (ref 10–24)
BUN: 17 mg/dL (ref 8–27)
Bilirubin Total: 0.5 mg/dL (ref 0.0–1.2)
CO2: 24 mmol/L (ref 18–29)
CREATININE: 0.94 mg/dL (ref 0.76–1.27)
Calcium: 9 mg/dL (ref 8.6–10.2)
Chloride: 99 mmol/L (ref 96–106)
GFR, EST AFRICAN AMERICAN: 95 mL/min/{1.73_m2} (ref >59)
GFR, EST NON AFRICAN AMERICAN: 82 mL/min/{1.73_m2} (ref >59)
GLOBULIN, TOTAL: 2.3 g/dL (ref 1.5–4.5)
Glucose: 180 mg/dL — ABNORMAL HIGH (ref 65–99)
Potassium: 4 mmol/L (ref 3.5–5.2)
SODIUM: 139 mmol/L (ref 134–144)
TOTAL PROTEIN: 6.7 g/dL (ref 6.0–8.5)

## 2015-07-22 LAB — LIPID PANEL W/O CHOL/HDL RATIO
CHOLESTEROL TOTAL: 186 mg/dL (ref 100–199)
HDL: 35 mg/dL — ABNORMAL LOW (ref >39)
LDL CALC: 113 mg/dL — AB (ref 0–99)
Triglycerides: 190 mg/dL — ABNORMAL HIGH (ref 0–149)
VLDL Cholesterol Cal: 38 mg/dL (ref 5–40)

## 2015-07-22 LAB — TSH: TSH: 1.5 u[IU]/mL (ref 0.450–4.500)

## 2015-07-22 LAB — HEPATITIS C ANTIBODY

## 2015-07-22 LAB — PSA: PROSTATE SPECIFIC AG, SERUM: 12.3 ng/mL — AB (ref 0.0–4.0)

## 2015-07-22 NOTE — Telephone Encounter (Signed)
Phone call Discussed with patient elevated PSA reviewed urology notes from Dr. Bernardo Heater 2014 recommending follow-up for PSA elevations and consider biopsy again

## 2015-08-27 DIAGNOSIS — E119 Type 2 diabetes mellitus without complications: Secondary | ICD-10-CM | POA: Diagnosis not present

## 2015-08-27 LAB — HM DIABETES EYE EXAM

## 2015-10-28 ENCOUNTER — Ambulatory Visit: Payer: Medicare Other | Admitting: Family Medicine

## 2015-11-09 ENCOUNTER — Ambulatory Visit (INDEPENDENT_AMBULATORY_CARE_PROVIDER_SITE_OTHER): Payer: Medicare Other | Admitting: Family Medicine

## 2015-11-09 ENCOUNTER — Encounter: Payer: Self-pay | Admitting: Family Medicine

## 2015-11-09 VITALS — BP 127/79 | HR 68 | Temp 97.5°F | Ht 73.0 in | Wt 202.7 lb

## 2015-11-09 DIAGNOSIS — Z23 Encounter for immunization: Secondary | ICD-10-CM

## 2015-11-09 DIAGNOSIS — E119 Type 2 diabetes mellitus without complications: Secondary | ICD-10-CM

## 2015-11-09 DIAGNOSIS — I10 Essential (primary) hypertension: Secondary | ICD-10-CM | POA: Diagnosis not present

## 2015-11-09 LAB — MICROALBUMIN, URINE WAIVED
Creatinine, Urine Waived: 200 mg/dL (ref 10–300)
Microalb, Ur Waived: 30 mg/L — ABNORMAL HIGH (ref 0–19)

## 2015-11-09 LAB — BAYER DCA HB A1C WAIVED: HB A1C: 7.4 % — AB (ref <7.0)

## 2015-11-09 MED ORDER — VARDENAFIL HCL 20 MG PO TABS: 20.0000 mg | ORAL_TABLET | Freq: Every day | ORAL | 12 refills | Status: DC | PRN

## 2015-11-09 NOTE — Assessment & Plan Note (Signed)
The current medical regimen is effective;  continue present plan and medications.  

## 2015-11-09 NOTE — Progress Notes (Signed)
BP 127/79 (BP Location: Left Arm, Patient Position: Sitting, Cuff Size: Normal)   Pulse 68   Temp 97.5 F (36.4 C)   Ht 6\' 1"  (1.854 m)   Wt 202 lb 11.2 oz (91.9 kg)   SpO2 95%   BMI 26.74 kg/m    Subjective:    Patient ID: Tyler Morgan, male    DOB: Jun 20, 1945, 70 y.o.   MRN: [id number]  HPI: Tyler Morgan is a 70 y.o. male  Chief Complaint  Patient presents with  . Follow-up    Relevant past medical, surgical, family and social history reviewed and updated as indicated. Interim medical history since our last visit reviewed. Allergies and medications reviewed and updated.  Review of Systems  Constitutional: Negative.   Respiratory: Negative.   Cardiovascular: Negative.     Per HPI unless specifically indicated above     Objective:    BP 127/79 (BP Location: Left Arm, Patient Position: Sitting, Cuff Size: Normal)   Pulse 68   Temp 97.5 F (36.4 C)   Ht 6\' 1"  (1.854 m)   Wt 202 lb 11.2 oz (91.9 kg)   SpO2 95%   BMI 26.74 kg/m   Wt Readings from Last 3 Encounters:  11/09/15 202 lb 11.2 oz (91.9 kg)  07/21/15 203 lb (92.1 kg)  12/15/14 198 lb (89.8 kg)    Physical Exam  Constitutional: He is oriented to person, place, and time. He appears well-developed and well-nourished. No distress.  HENT:  Head: Normocephalic and atraumatic.  Right Ear: Hearing normal.  Left Ear: Hearing normal.  Nose: Nose normal.  Eyes: Conjunctivae and lids are normal. Right eye exhibits no discharge. Left eye exhibits no discharge. No scleral icterus.  Cardiovascular: Normal rate, regular rhythm and normal heart sounds.   Pulmonary/Chest: Effort normal and breath sounds normal. No respiratory distress.  Musculoskeletal: Normal range of motion.  Neurological: He is alert and oriented to person, place, and time.  Skin: Skin is intact. No rash noted.  Psychiatric: He has a normal mood and affect. His speech is normal and behavior is normal. Judgment and thought content  normal. Cognition and memory are normal.    Results for orders placed or performed in visit on 11/09/15  Bayer DCA Hb A1c Waived  Result Value Ref Range   Bayer DCA Hb A1c Waived 7.4 (H) <7.0 %  Microalbumin, Urine Waived  Result Value Ref Range   Microalb, Ur Waived 30 (H) 0 - 19 mg/L   Creatinine, Urine Waived 200 10 - 300 mg/dL   Microalb/Creat Ratio <30 <30 mg/g  HM COLONOSCOPY  Result Value Ref Range   HM Colonoscopy Patient Reported See Report (in chart), Patient Reported      Assessment & Plan:   Problem List Items Addressed This Visit      Cardiovascular and Mediastinum   Hypertension    The current medical regimen is effective;  continue present plan and medications.       Relevant Medications   vardenafil (LEVITRA) 20 MG tablet     Endocrine   DM type 2 (diabetes mellitus, type 2) ([place])    Discussed working on diet exercise nutrition Will not increase medications at this point patient's had dramatic improvement in blood sugar will continue good work and at weight loss.        Other Visit Diagnoses    Diabetes mellitus without complication ([place])    -  Primary   Relevant Orders   Bayer DCA Hb A1c  Waived (Completed)   Microalbumin, Urine Waived       Follow up plan: Return in about 3 months (around 02/08/2016) for Hemoglobin A1c, BMP.

## 2015-11-09 NOTE — Addendum Note (Signed)
Addended by: Wynn Maudlin on: 11/09/2015 04:51 PM   Modules accepted: Orders

## 2015-11-09 NOTE — Assessment & Plan Note (Signed)
Discussed working on diet exercise nutrition Will not increase medications at this point patient's had dramatic improvement in blood sugar will continue good work and at weight loss.

## 2015-11-17 ENCOUNTER — Other Ambulatory Visit: Payer: Self-pay | Admitting: Family Medicine

## 2015-11-17 DIAGNOSIS — I10 Essential (primary) hypertension: Secondary | ICD-10-CM

## 2015-12-18 ENCOUNTER — Other Ambulatory Visit: Payer: Self-pay | Admitting: Family Medicine

## 2015-12-18 DIAGNOSIS — I10 Essential (primary) hypertension: Secondary | ICD-10-CM

## 2016-01-19 ENCOUNTER — Other Ambulatory Visit: Payer: Self-pay | Admitting: Family Medicine

## 2016-01-19 DIAGNOSIS — I10 Essential (primary) hypertension: Secondary | ICD-10-CM

## 2016-01-19 NOTE — Telephone Encounter (Signed)
Routing to provider  

## 2016-02-07 ENCOUNTER — Telehealth: Payer: Self-pay | Admitting: Family Medicine

## 2016-02-07 DIAGNOSIS — I10 Essential (primary) hypertension: Secondary | ICD-10-CM

## 2016-02-07 MED ORDER — BENAZEPRIL HCL 40 MG PO TABS: 40.0000 mg | ORAL_TABLET | Freq: Every day | ORAL | 1 refills | Status: DC

## 2016-02-07 NOTE — Telephone Encounter (Signed)
refill 

## 2016-02-08 ENCOUNTER — Ambulatory Visit: Payer: Medicare Other | Admitting: Family Medicine

## 2016-03-01 ENCOUNTER — Ambulatory Visit: Payer: Medicare Other | Admitting: Family Medicine

## 2016-03-20 ENCOUNTER — Other Ambulatory Visit: Payer: Self-pay | Admitting: Family Medicine

## 2016-03-20 DIAGNOSIS — I10 Essential (primary) hypertension: Secondary | ICD-10-CM

## 2016-03-20 NOTE — Telephone Encounter (Signed)
Routing to provider. Appt 04/12/16.

## 2016-04-12 ENCOUNTER — Ambulatory Visit (INDEPENDENT_AMBULATORY_CARE_PROVIDER_SITE_OTHER): Payer: Medicare Other | Admitting: Family Medicine

## 2016-04-12 ENCOUNTER — Encounter: Payer: Self-pay | Admitting: Family Medicine

## 2016-04-12 VITALS — BP 137/84 | HR 68 | Ht 73.0 in | Wt 203.6 lb

## 2016-04-12 DIAGNOSIS — E119 Type 2 diabetes mellitus without complications: Secondary | ICD-10-CM | POA: Diagnosis not present

## 2016-04-12 DIAGNOSIS — I10 Essential (primary) hypertension: Secondary | ICD-10-CM | POA: Diagnosis not present

## 2016-04-12 LAB — BAYER DCA HB A1C WAIVED: HB A1C: 9 % — AB (ref <7.0)

## 2016-04-12 MED ORDER — EMPAGLIFLOZIN 25 MG PO TABS: 25.0000 mg | ORAL_TABLET | Freq: Every day | ORAL | 4 refills | Status: DC

## 2016-04-12 NOTE — Assessment & Plan Note (Signed)
The current medical regimen is effective;  continue present plan and medications.  

## 2016-04-12 NOTE — Progress Notes (Signed)
BP 137/84   Pulse 68   Ht 6\' 1"  (1.854 m)   Wt 203 lb 9.6 oz (92.4 kg)   SpO2 97%   BMI 26.86 kg/m    Subjective:    Patient ID: Tyler Morgan, male    DOB: 06/06/1945, 71 y.o.   MRN: [id number]  HPI: Tyler Morgan is a 71 y.o. male  Chief Complaint  Patient presents with  . Diabetes  . Follow-up  Patient follow-up doing well no complaints blood pressure good control no issues with medications. Takes medications faithfully. Diabetes doing well noted low blood sugar spells takes metformin without any problems. Patient did fall but was on ice in the dark. Patient was hurt for about a week but otherwise has done fine.  Relevant past medical, surgical, family and social history reviewed and updated as indicated. Interim medical history since our last visit reviewed. Allergies and medications reviewed and updated.  Review of Systems  Constitutional: Negative.   Respiratory: Negative.   Cardiovascular: Negative.     Per HPI unless specifically indicated above     Objective:    BP 137/84   Pulse 68   Ht 6\' 1"  (1.854 m)   Wt 203 lb 9.6 oz (92.4 kg)   SpO2 97%   BMI 26.86 kg/m   Wt Readings from Last 3 Encounters:  04/12/16 203 lb 9.6 oz (92.4 kg)  11/09/15 202 lb 11.2 oz (91.9 kg)  07/21/15 203 lb (92.1 kg)    Physical Exam  Constitutional: He is oriented to person, place, and time. He appears well-developed and well-nourished. No distress.  HENT:  Head: Normocephalic and atraumatic.  Right Ear: Hearing normal.  Left Ear: Hearing normal.  Nose: Nose normal.  Eyes: Conjunctivae and lids are normal. Right eye exhibits no discharge. Left eye exhibits no discharge. No scleral icterus.  Cardiovascular: Normal rate, regular rhythm and normal heart sounds.   Pulmonary/Chest: Effort normal and breath sounds normal. No respiratory distress.  Musculoskeletal: Normal range of motion.  Neurological: He is alert and oriented to person, place, and time.  Skin: Skin  is intact. No rash noted.  Psychiatric: He has a normal mood and affect. His speech is normal and behavior is normal. Judgment and thought content normal. Cognition and memory are normal.    Results for orders placed or performed in visit on 11/09/15  Bayer DCA Hb A1c Waived  Result Value Ref Range   Bayer DCA Hb A1c Waived 7.4 (H) <7.0 %  Microalbumin, Urine Waived  Result Value Ref Range   Microalb, Ur Waived 30 (H) 0 - 19 mg/L   Creatinine, Urine Waived 200 10 - 300 mg/dL   Microalb/Creat Ratio <30 <30 mg/g  HM COLONOSCOPY  Result Value Ref Range   HM Colonoscopy Patient Reported See Report (in chart), Patient Reported      Assessment & Plan:   Problem List Items Addressed This Visit      Cardiovascular and Mediastinum   Hypertension - Primary    The current medical regimen is effective;  continue present plan and medications.       Relevant Orders   Bayer DCA Hb A1c Waived   Basic metabolic panel     Endocrine   DM type 2 (diabetes mellitus, type 2) ([place])    Discussed diabetes poor control with elevated blood sugars discuss lifestyle improvement exercise nutrition. We will also start Jardiance one a day. Patient education given on alternatives      Relevant Medications  empagliflozin (JARDIANCE) 25 MG TABS tablet   Other Relevant Orders   Bayer DCA Hb A1c Waived   Basic metabolic panel       Follow up plan: Return in about 3 months (around 07/10/2016) for Hemoglobin A1c, Physical Exam.

## 2016-04-12 NOTE — Assessment & Plan Note (Signed)
Discussed diabetes poor control with elevated blood sugars discuss lifestyle improvement exercise nutrition. We will also start Jardiance one a day. Patient education given on alternatives

## 2016-04-13 ENCOUNTER — Encounter: Payer: Self-pay | Admitting: Family Medicine

## 2016-04-13 LAB — BASIC METABOLIC PANEL
BUN / CREAT RATIO: 21 (ref 10–24)
BUN: 16 mg/dL (ref 8–27)
CO2: 24 mmol/L (ref 18–29)
CREATININE: 0.76 mg/dL (ref 0.76–1.27)
Calcium: 9.5 mg/dL (ref 8.6–10.2)
Chloride: 100 mmol/L (ref 96–106)
GFR, EST AFRICAN AMERICAN: 107 (ref >59)
GFR, EST NON AFRICAN AMERICAN: 92 (ref >59)
Glucose: 181 mg/dL — ABNORMAL HIGH (ref 65–99)
POTASSIUM: 4.1 mmol/L (ref 3.5–5.2)
SODIUM: 141 mmol/L (ref 134–144)

## 2016-07-17 ENCOUNTER — Other Ambulatory Visit: Payer: Self-pay | Admitting: Family Medicine

## 2016-07-17 DIAGNOSIS — E119 Type 2 diabetes mellitus without complications: Secondary | ICD-10-CM

## 2016-07-17 DIAGNOSIS — I10 Essential (primary) hypertension: Secondary | ICD-10-CM

## 2016-07-18 MED ORDER — HYDROCHLOROTHIAZIDE 25 MG PO TABS: 25.0000 mg | ORAL_TABLET | Freq: Every day | ORAL | 1 refills | Status: DC

## 2016-07-18 MED ORDER — ATENOLOL 100 MG PO TABS: 100.0000 mg | ORAL_TABLET | Freq: Every day | ORAL | 1 refills | Status: DC

## 2016-07-18 MED ORDER — METFORMIN HCL 500 MG PO TABS: 1000.0000 mg | ORAL_TABLET | Freq: Two times a day (BID) | ORAL | 1 refills | Status: DC

## 2016-07-18 NOTE — Telephone Encounter (Signed)
Last OV: 04/12/16 Next OV: 09/14/16  BMP Latest Ref Rng & Units 04/12/2016 07/21/2015 12/15/2014  Glucose 65 - 99 mg/dL 181(H) 180(H) 138(H)  BUN 8 - 27 mg/dL 16 17 16   Creatinine 0.76 - 1.27 mg/dL 0.76 0.94 0.92  BUN/Creat Ratio 10 - 24 21 18 17   Sodium 134 - 144 mmol/L 141 139 141  Potassium 3.5 - 5.2 mmol/L 4.1 4.0 4.0  Chloride 96 - 106 mmol/L 100 99 100  CO2 18 - 29 mmol/L 24 24 27   Calcium 8.6 - 10.2 mg/dL 9.5 9.0 9.2

## 2016-08-21 ENCOUNTER — Other Ambulatory Visit: Payer: Self-pay | Admitting: Family Medicine

## 2016-08-21 NOTE — Telephone Encounter (Signed)
  Last routine OV: 04/12/16 Next OV: 09/14/16

## 2016-09-14 ENCOUNTER — Ambulatory Visit (INDEPENDENT_AMBULATORY_CARE_PROVIDER_SITE_OTHER): Payer: Medicare Other

## 2016-09-14 VITALS — BP 112/68 | HR 67 | Temp 97.7°F | Resp 16 | Ht 72.0 in | Wt 190.6 lb

## 2016-09-14 DIAGNOSIS — Z0000 Encounter for general adult medical examination without abnormal findings: Secondary | ICD-10-CM

## 2016-09-14 NOTE — Patient Instructions (Signed)
[name] , Thank you for taking time to come for your Medicare Wellness Visit. I appreciate your ongoing commitment to your health goals. Please review the following plan we discussed and let me know if I can assist you in the future.   Screening recommendations/referrals: Colonoscopy: Completed 03/14/2011 Recommended yearly ophthalmology/optometry visit for glaucoma screening and checkup Recommended yearly dental visit for hygiene and checkup  Vaccinations: Influenza vaccine: up to date, due 10/2016 Pneumococcal vaccine: up to date Tdap vaccine: up to date Shingles vaccine: up to date  Advanced directives: Advance directive discussed with you today. I have provided a copy for you to complete at home and have notarized. Once this is complete please bring a copy in to our office so we can scan it into your chart.  Conditions/risks identified: Recommend drinking at least 5-6 glasses of water a day   Next appointment: Follow up on 10/11/2016 at 4:00pm with Dr.Crissman. Follow up in one year for your annual wellness exam.   Preventive Care 65 Years and Older, Male Preventive care refers to lifestyle choices and visits with your health care provider that can promote health and wellness. What does preventive care include?  A yearly physical exam. This is also called an annual well check.  Dental exams once or twice a year.  Routine eye exams. Ask your health care provider how often you should have your eyes checked.  Personal lifestyle choices, including:  Daily care of your teeth and gums.  Regular physical activity.  Eating a healthy diet.  Avoiding tobacco and drug use.  Limiting alcohol use.  Practicing safe sex.  Taking low doses of aspirin every day.  Taking vitamin and mineral supplements as recommended by your health care provider. What happens during an annual well check? The services and screenings done by your health care provider during your annual well check  will depend on your age, overall health, lifestyle risk factors, and family history of disease. Counseling  Your health care provider may ask you questions about your:  Alcohol use.  Tobacco use.  Drug use.  Emotional well-being.  Home and relationship well-being.  Sexual activity.  Eating habits.  History of falls.  Memory and ability to understand (cognition).  Work and work Statistician. Screening  You may have the following tests or measurements:  Height, weight, and BMI.  Blood pressure.  Lipid and cholesterol levels. These may be checked every 5 years, or more frequently if you are over 16 years old.  Skin check.  Lung cancer screening. You may have this screening every year starting at age 54 if you have a 30-pack-year history of smoking and currently smoke or have quit within the past 15 years.  Fecal occult blood test (FOBT) of the stool. You may have this test every year starting at age 2.  Flexible sigmoidoscopy or colonoscopy. You may have a sigmoidoscopy every 5 years or a colonoscopy every 10 years starting at age 56.  Prostate cancer screening. Recommendations will vary depending on your family history and other risks.  Hepatitis C blood test.  Hepatitis B blood test.  Sexually transmitted disease (STD) testing.  Diabetes screening. This is done by checking your blood sugar (glucose) after you have not eaten for a while (fasting). You may have this done every 1-3 years.  Abdominal aortic aneurysm (AAA) screening. You may need this if you are a current or former smoker.  Osteoporosis. You may be screened starting at age 83 if you are at high risk.  Talk with your health care provider about your test results, treatment options, and if necessary, the need for more tests. Vaccines  Your health care provider may recommend certain vaccines, such as:  Influenza vaccine. This is recommended every year.  Tetanus, diphtheria, and acellular pertussis  (Tdap, Td) vaccine. You may need a Td booster every 10 years.  Zoster vaccine. You may need this after age 21.  Pneumococcal 13-valent conjugate (PCV13) vaccine. One dose is recommended after age 67.  Pneumococcal polysaccharide (PPSV23) vaccine. One dose is recommended after age 2. Talk to your health care provider about which screenings and vaccines you need and how often you need them. This information is not intended to replace advice given to you by your health care provider. Make sure you discuss any questions you have with your health care provider. Document Released: 03/05/2015 Document Revised: 10/27/2015 Document Reviewed: 12/08/2014 Elsevier Interactive Patient Education  2017 [place] Prevention in the Home Falls can cause injuries. They can happen to people of all ages. There are many things you can do to make your home safe and to help prevent falls. What can I do on the outside of my home?  Regularly fix the edges of walkways and driveways and fix any cracks.  Remove anything that might make you trip as you walk through a door, such as a raised step or threshold.  Trim any bushes or trees on the path to your home.  Use bright outdoor lighting.  Clear any walking paths of anything that might make someone trip, such as rocks or tools.  Regularly check to see if handrails are loose or broken. Make sure that both sides of any steps have handrails.  Any raised decks and porches should have guardrails on the edges.  Have any leaves, snow, or ice cleared regularly.  Use sand or salt on walking paths during winter.  Clean up any spills in your garage right away. This includes oil or grease spills. What can I do in the bathroom?  Use night lights.  Install grab bars by the toilet and in the tub and shower. Do not use towel bars as grab bars.  Use non-skid mats or decals in the tub or shower.  If you need to sit down in the shower, use a plastic, non-slip  stool.  Keep the floor dry. Clean up any water that spills on the floor as soon as it happens.  Remove soap buildup in the tub or shower regularly.  Attach bath mats securely with double-sided non-slip rug tape.  Do not have throw rugs and other things on the floor that can make you trip. What can I do in the bedroom?  Use night lights.  Make sure that you have a light by your bed that is easy to reach.  Do not use any sheets or blankets that are too big for your bed. They should not hang down onto the floor.  Have a firm chair that has side arms. You can use this for support while you get dressed.  Do not have throw rugs and other things on the floor that can make you trip. What can I do in the kitchen?  Clean up any spills right away.  Avoid walking on wet floors.  Keep items that you use a lot in easy-to-reach places.  If you need to reach something above you, use a strong step stool that has a grab bar.  Keep electrical cords out of the way.  Do not use floor polish or wax that makes floors slippery. If you must use wax, use non-skid floor wax.  Do not have throw rugs and other things on the floor that can make you trip. What can I do with my stairs?  Do not leave any items on the stairs.  Make sure that there are handrails on both sides of the stairs and use them. Fix handrails that are broken or loose. Make sure that handrails are as long as the stairways.  Check any carpeting to make sure that it is firmly attached to the stairs. Fix any carpet that is loose or worn.  Avoid having throw rugs at the top or bottom of the stairs. If you do have throw rugs, attach them to the floor with carpet tape.  Make sure that you have a light switch at the top of the stairs and the bottom of the stairs. If you do not have them, ask someone to add them for you. What else can I do to help prevent falls?  Wear shoes that:  Do not have high heels.  Have rubber bottoms.  Are  comfortable and fit you well.  Are closed at the toe. Do not wear sandals.  If you use a stepladder:  Make sure that it is fully opened. Do not climb a closed stepladder.  Make sure that both sides of the stepladder are locked into place.  Ask someone to hold it for you, if possible.  Clearly mark and make sure that you can see:  Any grab bars or handrails.  First and last steps.  Where the edge of each step is.  Use tools that help you move around (mobility aids) if they are needed. These include:  Canes.  Walkers.  Scooters.  Crutches.  Turn on the lights when you go into a dark area. Replace any light bulbs as soon as they burn out.  Set up your furniture so you have a clear path. Avoid moving your furniture around.  If any of your floors are uneven, fix them.  If there are any pets around you, be aware of where they are.  Review your medicines with your doctor. Some medicines can make you feel dizzy. This can increase your chance of falling. Ask your doctor what other things that you can do to help prevent falls. This information is not intended to replace advice given to you by your health care provider. Make sure you discuss any questions you have with your health care provider. Document Released: 12/03/2008 Document Revised: 07/15/2015 Document Reviewed: 03/13/2014 Elsevier Interactive Patient Education  2017 [employer].

## 2016-09-14 NOTE — Progress Notes (Signed)
Subjective:   Tyler Morgan is a 71 y.o. male who presents for Medicare Annual/Subsequent preventive examination.  Review of Systems:  Cardiac Risk Factors include: male gender;advanced age (>44men, >72 women);diabetes mellitus;hypertension     Objective:    Vitals: BP 112/68 (BP Location: Left Arm, Patient Position: Sitting)   Pulse 67   Temp 97.7 F (36.5 C)   Resp 16   Ht 6' (1.829 m)   Wt 190 lb 9.6 oz (86.5 kg)   BMI 25.85 kg/m   Body mass index is 25.85 kg/m.  Tobacco History  Smoking Status  . Former Smoker  . Packs/day: 1.00  . Years: 13.00  . Types: Cigarettes  . Quit date: 02/21/1984  Smokeless Tobacco  . Never Used     Counseling given: Not Answered   Past Medical History:  Diagnosis Date  . Diabetes mellitus without complication ([place])   . ED (erectile dysfunction)   . Hypertension    Past Surgical History:  Procedure Laterality Date  . CATARACT EXTRACTION Bilateral 1986, 2014  . EYE SURGERY Bilateral [id number]  . FRACTURE SURGERY Right    as a child/ right arm  . HERNIA REPAIR Right 01-13-14   inguinal hernia  . RETINAL DETACHMENT SURGERY  Jan 2015  . TONSILLECTOMY  1995   Family History  Problem Relation Age of Onset  . Colon cancer Father   . Angina Father   . Macular degeneration Father   . Alzheimer's disease Mother    History  Sexual Activity  . Sexual activity: Not on file    Outpatient Encounter Prescriptions as of 09/14/2016  Medication Sig  . atenolol (TENORMIN) 100 MG tablet Take 1 tablet (100 mg total) by mouth daily.  . benazepril (LOTENSIN) 40 MG tablet Take 1 tablet (40 mg total) by mouth daily.  . calcium carbonate (OS-CAL) 600 MG TABS tablet Take 600 mg by mouth 2 (two) times daily with a meal.  . Cholecalciferol (VITAMIN D-3) 5000 UNITS TABS Take by mouth daily.   . hydrochlorothiazide (HYDRODIURIL) 25 MG tablet Take 1 tablet (25 mg total) by mouth daily.  Marland Kitchen JARDIANCE 25 MG TABS tablet TAKE ONE TABLET EVERY DAY    . metFORMIN (GLUCOPHAGE) 500 MG tablet Take 2 tablets (1,000 mg total) by mouth 2 (two) times daily with a meal.  . Multiple Vitamins-Minerals (MULTIVITAMIN WITH MINERALS) tablet Take 1 tablet by mouth daily.  . vardenafil (LEVITRA) 20 MG tablet Take 1 tablet (20 mg total) by mouth daily as needed for erectile dysfunction. (Patient not taking: Reported on 09/14/2016)  . [DISCONTINUED] benazepril (LOTENSIN) 40 MG tablet TAKE ONE TABLET EVERY DAY   No facility-administered encounter medications on file as of 09/14/2016.     Activities of Daily Living In your present state of health, do you have any difficulty performing the following activities: 09/14/2016  Hearing? N  Vision? N  Difficulty concentrating or making decisions? N  Walking or climbing stairs? N  Dressing or bathing? N  Doing errands, shopping? N  Preparing Food and eating ? N  Using the Toilet? N  In the past six months, have you accidently leaked urine? N  Do you have problems with loss of bowel control? N  Managing your Medications? N  Managing your Finances? N  Housekeeping or managing your Housekeeping? N  Some recent data might be hidden    Patient Care Team: Guadalupe Maple, MD as PCP - General (Family Medicine) Guadalupe Maple, MD (Family Medicine) Christene Lye,  MD (General Surgery) Schnier, Dolores Lory, MD (Vascular Surgery)   Assessment:     Exercise Activities and Dietary recommendations Current Exercise Habits: The patient does not participate in regular exercise at present, Intensity: Mild, Exercise limited by: None identified  Goals    . Increase water intake          Recommend drinking at least 5-6 glasses of water a day       Fall Risk Fall Risk  09/14/2016 04/12/2016 07/21/2015 12/15/2014  Falls in the past year? Yes Yes Yes No  Number falls in past yr: 2 or more 1 1 -  Injury with Fall? Yes No No -  Follow up Falls prevention discussed - - -   Depression Screen PHQ 2/9 Scores  09/14/2016 07/21/2015 12/15/2014  PHQ - 2 Score 0 0 0    Cognitive Function     6CIT Screen 09/14/2016  What Year? 0 points  What month? 0 points  What time? 0 points  Count back from 20 0 points  Months in reverse 0 points  Repeat phrase 2 points  Total Score 2    Immunization History  Administered Date(s) Administered  . Influenza, High Dose Seasonal PF 11/09/2015  . Influenza,inj,Quad PF,36+ Mos 12/15/2014  . Pneumococcal Conjugate-13 05/13/2014  . Pneumococcal Polysaccharide-23 07/21/2015  . Pneumococcal-Unspecified 01/09/2008  . Tdap 09/02/2009  . Zoster 01/09/2008   Screening Tests Health Maintenance  Topic Date Due  . OPHTHALMOLOGY EXAM  08/26/2016  . INFLUENZA VACCINE  09/20/2016  . HEMOGLOBIN A1C  10/10/2016  . FOOT EXAM  11/08/2016  . TETANUS/TDAP  09/03/2019  . COLONOSCOPY  03/13/2021  . Hepatitis C Screening  Completed  . PNA vac Low Risk Adult  Completed      Plan:    I have personally reviewed and addressed the Medicare Annual Wellness questionnaire and have noted the following in the patient's chart:  A. Medical and social history B. Use of alcohol, tobacco or illicit drugs  C. Current medications and supplements D. Functional ability and status E.  Nutritional status F.  Physical activity G. Advance directives H. List of other physicians I.  Hospitalizations, surgeries, and ER visits in previous 12 months J.  [place] such as hearing and vision if needed, cognitive and depression L. Referrals and appointments  In addition, I have reviewed and discussed with patient certain preventive protocols, quality metrics, and best practice recommendations. A written personalized care plan for preventive services as well as general preventive health recommendations were provided to patient.   Signed,  Tyler Aas, LPN Nurse Health Advisor   MD Recommendations: Discussed with Dr.Crissman redness and edema in patients right lower calf.  Started a few days ago with no noted cause. Some swelling noted in left leg as well. Patient denies any pain in calf area. Dr.Crissman observed area, pt educated to elevate legs when sitting or lying down. Patient informed to call if it worsens before next visit on 10/11/2016

## 2016-10-11 ENCOUNTER — Ambulatory Visit (INDEPENDENT_AMBULATORY_CARE_PROVIDER_SITE_OTHER): Payer: Medicare Other | Admitting: Family Medicine

## 2016-10-11 ENCOUNTER — Encounter: Payer: Self-pay | Admitting: Family Medicine

## 2016-10-11 VITALS — BP 114/70 | HR 66 | Wt 188.0 lb

## 2016-10-11 DIAGNOSIS — Z1329 Encounter for screening for other suspected endocrine disorder: Secondary | ICD-10-CM | POA: Diagnosis not present

## 2016-10-11 DIAGNOSIS — M6281 Muscle weakness (generalized): Secondary | ICD-10-CM

## 2016-10-11 DIAGNOSIS — Z7189 Other specified counseling: Secondary | ICD-10-CM | POA: Diagnosis not present

## 2016-10-11 DIAGNOSIS — I10 Essential (primary) hypertension: Secondary | ICD-10-CM | POA: Diagnosis not present

## 2016-10-11 DIAGNOSIS — Z13220 Encounter for screening for lipoid disorders: Secondary | ICD-10-CM

## 2016-10-11 DIAGNOSIS — E119 Type 2 diabetes mellitus without complications: Secondary | ICD-10-CM

## 2016-10-11 DIAGNOSIS — Z125 Encounter for screening for malignant neoplasm of prostate: Secondary | ICD-10-CM

## 2016-10-11 DIAGNOSIS — N481 Balanitis: Secondary | ICD-10-CM | POA: Insufficient documentation

## 2016-10-11 MED ORDER — EMPAGLIFLOZIN 25 MG PO TABS: 25.0000 mg | ORAL_TABLET | Freq: Every day | ORAL | 4 refills | Status: DC

## 2016-10-11 MED ORDER — BENAZEPRIL HCL 40 MG PO TABS: 40.0000 mg | ORAL_TABLET | Freq: Every day | ORAL | 4 refills | Status: DC

## 2016-10-11 MED ORDER — ATENOLOL 100 MG PO TABS: 100.0000 mg | ORAL_TABLET | Freq: Every day | ORAL | 4 refills | Status: DC

## 2016-10-11 MED ORDER — HYDROCHLOROTHIAZIDE 25 MG PO TABS: 25.0000 mg | ORAL_TABLET | Freq: Every day | ORAL | 4 refills | Status: DC

## 2016-10-11 MED ORDER — METFORMIN HCL 500 MG PO TABS: 1000.0000 mg | ORAL_TABLET | Freq: Two times a day (BID) | ORAL | 4 refills | Status: DC

## 2016-10-11 NOTE — Progress Notes (Signed)
BP 114/70   Pulse 66   Wt 188 lb (85.3 kg)   SpO2 99%   BMI 25.50 kg/m    Subjective:    Patient ID: Tyler Morgan, male    DOB: 09-25-1945, 71 y.o.   MRN: [id number]  HPI: Tyler Morgan is a 71 y.o. male  Chief Complaint  Patient presents with  . Annual Exam  . Redness    around ankles.    Patient all in all doing really well been trying to as lost over 14 pounds doing better with diet nutrition exercise taking medications faithfully without low blood sugar spells. At first with adding Jardiance was thinking low blood sugar but turned out to be some dehydration issues has increase fluids and no further dizzy episodes.  Patient also with history of balanitis now has marked scarring and unable to retract foreskin.  Patient also with continued calf weakness and developing some atrophy hasn't been able to stand on his tiptoes for some time and this weakness continues. Also has some redness on right lower calf area also on left. Relevant past medical, surgical, family and social history reviewed and updated as indicated. Interim medical history since our last visit reviewed. Allergies and medications reviewed and updated.  Review of Systems  Constitutional: Negative.   HENT: Negative.   Eyes: Negative.   Respiratory: Negative.   Cardiovascular: Negative.   Gastrointestinal: Negative.   Endocrine: Negative.   Genitourinary: Negative.   Musculoskeletal: Negative.   Skin: Negative.   Allergic/Immunologic: Negative.   Neurological: Negative.   Hematological: Negative.   Psychiatric/Behavioral: Negative.     Per HPI unless specifically indicated above     Objective:    BP 114/70   Pulse 66   Wt 188 lb (85.3 kg)   SpO2 99%   BMI 25.50 kg/m   Wt Readings from Last 3 Encounters:  10/11/16 188 lb (85.3 kg)  09/14/16 190 lb 9.6 oz (86.5 kg)  04/12/16 203 lb 9.6 oz (92.4 kg)    Physical Exam  Constitutional: He is oriented to person, place, and time. He  appears well-developed and well-nourished.  HENT:  Head: Normocephalic and atraumatic.  Right Ear: External ear normal.  Left Ear: External ear normal.  Eyes: Pupils are equal, round, and reactive to light. Conjunctivae and EOM are normal.  Neck: Normal range of motion. Neck supple.  Cardiovascular: Normal rate, regular rhythm, normal heart sounds and intact distal pulses.   Pulmonary/Chest: Effort normal and breath sounds normal.  Abdominal: Soft. Bowel sounds are normal. There is no splenomegaly or hepatomegaly.  Genitourinary: Rectum normal.  Genitourinary Comments: Changes of balanitis Prostate enlarged  Musculoskeletal: Normal range of motion.  Abnormal gait and calf muscle weakness unable to stand on tiptoes  Neurological: He is alert and oriented to person, place, and time. He has normal reflexes.  Skin: No rash noted. No erythema.  Psychiatric: He has a normal mood and affect. His behavior is normal. Judgment and thought content normal.    Results for orders placed or performed in visit on 04/12/16  Bayer DCA Hb A1c Waived  Result Value Ref Range   Bayer DCA Hb A1c Waived 9.0 (H) <8.4 %  Basic metabolic panel  Result Value Ref Range   Glucose 181 (H) 65 - 99 mg/dL   BUN 16 8 - 27 mg/dL   Creatinine, Ser 0.76 0.76 - 1.27 mg/dL   GFR calc non Af Amer 92 >59   GFR calc Af Amer 107 >59  BUN/Creatinine Ratio 21 10 - 24   Sodium 141 134 - 144 mmol/L   Potassium 4.1 3.5 - 5.2 mmol/L   Chloride 100 96 - 106 mmol/L   CO2 24 18 - 29 mmol/L   Calcium 9.5 8.6 - 10.2 mg/dL      Assessment & Plan:   Problem List Items Addressed This Visit      Cardiovascular and Mediastinum   Hypertension - Primary    The current medical regimen is effective;  continue present plan and medications.       Relevant Medications   atenolol (TENORMIN) 100 MG tablet   benazepril (LOTENSIN) 40 MG tablet   hydrochlorothiazide (HYDRODIURIL) 25 MG tablet   Other Relevant Orders   CBC with  Differential/Platelet     Endocrine   DM type 2 (diabetes mellitus, type 2) ([place])    The current medical regimen is effective;  continue present plan and medications.       Relevant Medications   benazepril (LOTENSIN) 40 MG tablet   metFORMIN (GLUCOPHAGE) 500 MG tablet   empagliflozin (JARDIANCE) 25 MG TABS tablet   Other Relevant Orders   Comprehensive metabolic panel   Urinalysis, Routine w reflex microscopic   Bayer DCA Hb A1c Waived     Genitourinary   Balanitis    Discussed care and treatment of balanitis will use over-the-counter antifungal cream also referral to urology for evaluation for circumcision      Relevant Orders   Ambulatory referral to Urology     Other   Calf muscle weakness    Reviewed calf muscle weakness patient's had similar symptoms for several years. Really hasn't gotten worse over the last several years but has noticed some difficulty walking when first starts getting up from a chair. Will refer to neurology to further evaluate.      Relevant Orders   Ambulatory referral to Neurology   Advanced care planning/counseling discussion    A voluntary discussion about advance care planning including the explanation and discussion of advance directives was extensively discussed  with the patient.  Explanation about the health care proxy and Living will was reviewed and packet with forms with explanation of how to fill them out was given.         Other Visit Diagnoses    Screening cholesterol level       Relevant Orders   Lipid panel   Prostate cancer screening       Relevant Orders   PSA   Thyroid disorder screen       Relevant Orders   TSH       Follow up plan: Return for Hemoglobin A1c, BMP,  Lipids, ALT, AST.

## 2016-10-11 NOTE — Assessment & Plan Note (Signed)
Discussed care and treatment of balanitis will use over-the-counter antifungal cream also referral to urology for evaluation for circumcision

## 2016-10-11 NOTE — Assessment & Plan Note (Signed)
A voluntary discussion about advance care planning including the explanation and discussion of advance directives was extensively discussed  with the patient.  Explanation about the health care proxy and Living will was reviewed and packet with forms with explanation of how to fill them out was given.    

## 2016-10-11 NOTE — Assessment & Plan Note (Signed)
The current medical regimen is effective;  continue present plan and medications.  

## 2016-10-11 NOTE — Assessment & Plan Note (Signed)
Reviewed calf muscle weakness patient's had similar symptoms for several years. Really hasn't gotten worse over the last several years but has noticed some difficulty walking when first starts getting up from a chair. Will refer to neurology to further evaluate.

## 2016-10-12 ENCOUNTER — Encounter: Payer: Self-pay | Admitting: Family Medicine

## 2016-10-12 LAB — COMPREHENSIVE METABOLIC PANEL
ALBUMIN: 4.5 g/dL (ref 3.5–4.8)
ALT: 25 IU/L (ref 0–44)
AST: 33 IU/L (ref 0–40)
Albumin/Globulin Ratio: 2 (ref 1.2–2.2)
Alkaline Phosphatase: 49 IU/L (ref 39–117)
BILIRUBIN TOTAL: 0.5 mg/dL (ref 0.0–1.2)
BUN / CREAT RATIO: 16 (ref 10–24)
BUN: 14 mg/dL (ref 8–27)
CHLORIDE: 100 mmol/L (ref 96–106)
CO2: 26 mmol/L (ref 20–29)
Calcium: 9.5 mg/dL (ref 8.6–10.2)
Creatinine, Ser: 0.87 mg/dL (ref 0.76–1.27)
GFR calc non Af Amer: 87 mL/min/{1.73_m2} (ref >59)
GFR, EST AFRICAN AMERICAN: 101 mL/min/{1.73_m2} (ref >59)
Globulin, Total: 2.2 g/dL (ref 1.5–4.5)
Glucose: 105 mg/dL — ABNORMAL HIGH (ref 65–99)
POTASSIUM: 3.9 mmol/L (ref 3.5–5.2)
SODIUM: 141 mmol/L (ref 134–144)
TOTAL PROTEIN: 6.7 g/dL (ref 6.0–8.5)

## 2016-10-12 LAB — URINALYSIS, ROUTINE W REFLEX MICROSCOPIC
BILIRUBIN UA: NEGATIVE
Leukocytes, UA: NEGATIVE
Nitrite, UA: NEGATIVE
PROTEIN UA: NEGATIVE
Specific Gravity, UA: 1.02 (ref 1.005–1.030)
UUROB: 0.2 mg/dL (ref 0.2–1.0)
pH, UA: 5.5 (ref 5.0–7.5)

## 2016-10-12 LAB — MICROSCOPIC EXAMINATION: Bacteria, UA: NONE SEEN

## 2016-10-12 LAB — CBC WITH DIFFERENTIAL/PLATELET
BASOS ABS: 0 10*3/uL (ref 0.0–0.2)
Basos: 1 %
EOS (ABSOLUTE): 0.4 10*3/uL (ref 0.0–0.4)
Eos: 7 %
HEMOGLOBIN: 14.4 g/dL (ref 13.0–17.7)
Hematocrit: 41.5 % (ref 37.5–51.0)
IMMATURE GRANS (ABS): 0 10*3/uL (ref 0.0–0.1)
IMMATURE GRANULOCYTES: 0 %
LYMPHS: 27 %
Lymphocytes Absolute: 1.7 10*3/uL (ref 0.7–3.1)
MCH: 29.1 pg (ref 26.6–33.0)
MCHC: 34.7 g/dL (ref 31.5–35.7)
MCV: 84 fL (ref 79–97)
MONOCYTES: 6 %
Monocytes Absolute: 0.4 10*3/uL (ref 0.1–0.9)
NEUTROS PCT: 59 %
Neutrophils Absolute: 3.7 10*3/uL (ref 1.4–7.0)
PLATELETS: 235 10*3/uL (ref 150–379)
RBC: 4.95 x10E6/uL (ref 4.14–5.80)
RDW: 14.8 % (ref 12.3–15.4)
WBC: 6.1 10*3/uL (ref 3.4–10.8)

## 2016-10-12 LAB — LIPID PANEL
Chol/HDL Ratio: 4.8 ratio (ref 0.0–5.0)
Cholesterol, Total: 203 mg/dL — ABNORMAL HIGH (ref 100–199)
HDL: 42 mg/dL (ref >39)
LDL Calculated: 113 mg/dL — ABNORMAL HIGH (ref 0–99)
Triglycerides: 238 mg/dL — ABNORMAL HIGH (ref 0–149)
VLDL Cholesterol Cal: 48 mg/dL — ABNORMAL HIGH (ref 5–40)

## 2016-10-12 LAB — PSA: PROSTATE SPECIFIC AG, SERUM: 8.5 ng/mL — AB (ref 0.0–4.0)

## 2016-10-12 LAB — TSH: TSH: 0.601 u[IU]/mL (ref 0.450–4.500)

## 2016-10-12 LAB — BAYER DCA HB A1C WAIVED: HB A1C: 6.2 % (ref <7.0)

## 2016-11-08 ENCOUNTER — Ambulatory Visit: Payer: Medicare Other | Admitting: Urology

## 2016-11-08 ENCOUNTER — Encounter: Payer: Self-pay | Admitting: Urology

## 2016-11-08 VITALS — BP 106/59 | HR 67 | Ht 72.0 in | Wt 188.4 lb

## 2016-11-08 DIAGNOSIS — N471 Phimosis: Secondary | ICD-10-CM | POA: Diagnosis not present

## 2016-11-08 NOTE — Progress Notes (Signed)
11/08/2016 4:14 PM   Tyler Morgan [birth year] [id number]  Referring provider: Guadalupe Maple, MD [address] [place], [place] [address]  Chief Complaint  Patient presents with  . New Patient (Initial Visit)    HPI: The patient is a 71 year old with uncircumcised male with past medical history of diabetes who presents today for evaluation of phimosis.  Patient notes worsening of phimosis particularly over the last few months. He is unable to retract his foreskin. He does trapped urine under his foreskin. Currently there is no burning or aching. He has been taking antifungal cream and placing this area which is helped with any irritation. He currently is not terribly bothered by his phimosis outside of the urine trapping under his foreskin.   PMH: Past Medical History:  Diagnosis Date  . Diabetes mellitus without complication ([place])   . ED (erectile dysfunction)   . Hypertension     Surgical History: Past Surgical History:  Procedure Laterality Date  . CATARACT EXTRACTION Bilateral 1986, 2014  . EYE SURGERY Bilateral [id number]  . FRACTURE SURGERY Right    as a child/ right arm  . HERNIA REPAIR Right 01-13-14   inguinal hernia  . RETINAL DETACHMENT SURGERY  Jan 2015  . TONSILLECTOMY  1995    Home Medications:  Allergies as of 11/08/2016      Reactions   Acetaminophen Swelling   Swelling of liver  swelling   Amlodipine Swelling      Medication List       Accurate as of 11/08/16  4:14 PM. Always use your most recent med list.          Alpha-Lipoic Acid 600 MG Caps Take by mouth.   atenolol 100 MG tablet Commonly known as:  TENORMIN Take 1 tablet (100 mg total) by mouth daily.   benazepril 40 MG tablet Commonly known as:  LOTENSIN Take 1 tablet (40 mg total) by mouth daily.   calcium carbonate 600 MG Tabs tablet Commonly known as:  OS-CAL Take 600 mg by mouth 2 (two) times daily with a meal.   empagliflozin 25 MG Tabs tablet Commonly known as:   JARDIANCE Take 25 mg by mouth daily.   hydrochlorothiazide 25 MG tablet Commonly known as:  HYDRODIURIL Take 1 tablet (25 mg total) by mouth daily.   metFORMIN 500 MG tablet Commonly known as:  GLUCOPHAGE Take 2 tablets (1,000 mg total) by mouth 2 (two) times daily with a meal.   multivitamin with minerals tablet Take 1 tablet by mouth daily.   VITAMIN B COMPLEX PO Take 50 mg by mouth.   Vitamin D-3 5000 units Tabs Take by mouth daily.       Allergies:  Allergies  Allergen Reactions  . Acetaminophen Swelling    Swelling of liver  swelling   . Amlodipine Swelling    Family History: Family History  Problem Relation Age of Onset  . Colon cancer Father   . Angina Father   . Macular degeneration Father   . Alzheimer's disease Mother   . Prostate cancer Neg Hx   . Bladder Cancer Neg Hx   . Kidney cancer Neg Hx     Social History:  reports that he quit smoking about 32 years ago. His smoking use included Cigarettes. He has a 13.00 pack-year smoking history. He has never used smokeless tobacco. He reports that he drinks about 1.2 oz of alcohol per week . He reports that he does not use drugs.  ROS: UROLOGY Frequent  Urination?: Yes Hard to postpone urination?: Yes Burning/pain with urination?: No Get up at night to urinate?: Yes Leakage of urine?: Yes Urine stream starts and stops?: Yes Trouble starting stream?: Yes Do you have to strain to urinate?: No Blood in urine?: No Urinary tract infection?: No Sexually transmitted disease?: No Injury to kidneys or bladder?: No Painful intercourse?: No Weak stream?: No Erection problems?: Yes Penile pain?: No  Gastrointestinal Nausea?: No Vomiting?: No Indigestion/heartburn?: No Diarrhea?: No Constipation?: No  Constitutional Fever: No Night sweats?: No Weight loss?: No Fatigue?: No  Skin Skin rash/lesions?: No Itching?: No  Eyes Blurred vision?: No Double vision?: No  Ears/Nose/Throat Sore  throat?: No Sinus problems?: No  Hematologic/Lymphatic Swollen glands?: No Easy bruising?: No  Cardiovascular Leg swelling?: No Chest pain?: No  Respiratory Cough?: No Shortness of breath?: No  Endocrine Excessive thirst?: No  Musculoskeletal Back pain?: No Joint pain?: No  Neurological Headaches?: No Dizziness?: No  Psychologic Depression?: No Anxiety?: No  Physical Exam: BP (!) 106/59 (BP Location: Left Arm, Patient Position: Sitting, Cuff Size: Normal)   Pulse 67   Ht 6' (1.829 m)   Wt 188 lb 6.4 oz (85.5 kg)   BMI 25.55 kg/m   Constitutional:  Alert and oriented, No acute distress. HEENT: [place] AT, moist mucus membranes.  Trachea midline, no masses. Cardiovascular: No clubbing, cyanosis, or edema. Respiratory: Normal respiratory effort, no increased work of breathing. GI: Abdomen is soft, nontender, nondistended, no abdominal masses GU: No CVA tenderness. Testicles are clear bilaterally. Patient phimosis. Unable to retract foreskin. Skin: No rashes, bruises or suspicious lesions. Lymph: No cervical or inguinal adenopathy. Neurologic: Grossly intact, no focal deficits, moving all 4 extremities. Psychiatric: Normal mood and affect.  Laboratory Data: Lab Results  Component Value Date   WBC 6.1 10/11/2016   HGB 14.4 10/11/2016   HCT 41.5 10/11/2016   MCV 84 10/11/2016   PLT 235 10/11/2016    Lab Results  Component Value Date   CREATININE 0.87 10/11/2016    No results found for: PSA  No results found for: TESTOSTERONE  Lab Results  Component Value Date   HGBA1C 8.2 07/21/2015    Urinalysis    Component Value Date/Time   APPEARANCEUR Cloudy (A) 10/11/2016 1555   GLUCOSEU 2+ (A) 10/11/2016 1555   BILIRUBINUR Negative 10/11/2016 1555   PROTEINUR Negative 10/11/2016 1555   NITRITE Negative 10/11/2016 1555   LEUKOCYTESUR Negative 10/11/2016 1555    Assessment & Plan:    1. Phimosis I discussed with the patient that the only cure for his  phimosis at this point would be to undergo a circumcision. We discussed the risks, benefits, and indications of this. 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 He does understand that this process will likely progress particularly with the irritation from the urine trapping under his foreskin. Since he is not currently bothered by this, he has elected to return in 6 months when his schedule is not as busy to further discuss a circumcision. If he changes his mind sooner, he'll contact the office.  Return in about 6 months (around 05/08/2017).  [name], MD  [facility] [address] [place], [place] [address] [phone]

## 2016-11-25 ENCOUNTER — Emergency Department
Admission: EM | Admit: 2016-11-25 | Discharge: 2016-11-26 | Disposition: A | Discharge status: Home / Self Care | Payer: Medicare Other | Attending: Emergency Medicine | Admitting: Emergency Medicine

## 2016-11-25 DIAGNOSIS — R102 Pelvic and perineal pain: Secondary | ICD-10-CM | POA: Diagnosis present

## 2016-11-25 DIAGNOSIS — Z7984 Long term (current) use of oral hypoglycemic drugs: Secondary | ICD-10-CM | POA: Insufficient documentation

## 2016-11-25 DIAGNOSIS — I10 Essential (primary) hypertension: Secondary | ICD-10-CM | POA: Diagnosis not present

## 2016-11-25 DIAGNOSIS — E119 Type 2 diabetes mellitus without complications: Secondary | ICD-10-CM | POA: Insufficient documentation

## 2016-11-25 DIAGNOSIS — N401 Enlarged prostate with lower urinary tract symptoms: Secondary | ICD-10-CM | POA: Diagnosis not present

## 2016-11-25 DIAGNOSIS — R338 Other retention of urine: Secondary | ICD-10-CM | POA: Diagnosis not present

## 2016-11-25 DIAGNOSIS — R339 Retention of urine, unspecified: Secondary | ICD-10-CM | POA: Insufficient documentation

## 2016-11-25 DIAGNOSIS — Z79899 Other long term (current) drug therapy: Secondary | ICD-10-CM | POA: Diagnosis not present

## 2016-11-25 DIAGNOSIS — Z87891 Personal history of nicotine dependence: Secondary | ICD-10-CM | POA: Diagnosis not present

## 2016-11-25 LAB — COMPREHENSIVE METABOLIC PANEL
ALT: 24 U/L (ref 17–63)
AST: 29 U/L (ref 15–41)
Albumin: 4.4 g/dL (ref 3.5–5.0)
Alkaline Phosphatase: 43 U/L (ref 38–126)
Anion gap: 13 (ref 5–15)
BILIRUBIN TOTAL: 0.7 mg/dL (ref 0.3–1.2)
BUN: 20 mg/dL (ref 6–20)
CO2: 21 mmol/L — ABNORMAL LOW (ref 22–32)
CREATININE: 1.26 mg/dL — AB (ref 0.61–1.24)
Calcium: 9.5 mg/dL (ref 8.9–10.3)
Chloride: 104 mmol/L (ref 101–111)
GFR calc Af Amer: >60 mL/min (ref >60)
GFR, EST NON AFRICAN AMERICAN: 56 mL/min — AB (ref >60)
GLUCOSE: 197 mg/dL — AB (ref 65–99)
Potassium: 3.5 mmol/L (ref 3.5–5.1)
Sodium: 138 mmol/L (ref 135–145)
TOTAL PROTEIN: 7.6 g/dL (ref 6.5–8.1)

## 2016-11-25 LAB — CBC WITH DIFFERENTIAL/PLATELET
Basophils Absolute: 0.1 10*3/uL (ref 0–0.1)
Basophils Relative: 0 %
EOS ABS: 0 10*3/uL (ref 0–0.7)
EOS PCT: 0 %
HCT: 45.1 % (ref 40.0–52.0)
Hemoglobin: 15.3 g/dL (ref 13.0–18.0)
LYMPHS ABS: 0.8 10*3/uL — AB (ref 1.0–3.6)
Lymphocytes Relative: 4 %
MCH: 29.6 pg (ref 26.0–34.0)
MCHC: 34 g/dL (ref 32.0–36.0)
MCV: 87.1 fL (ref 80.0–100.0)
MONO ABS: 1 10*3/uL (ref 0.2–1.0)
MONOS PCT: 5 %
Neutro Abs: 17.2 10*3/uL — ABNORMAL HIGH (ref 1.4–6.5)
Neutrophils Relative %: 91 %
PLATELETS: 247 10*3/uL (ref 150–440)
RBC: 5.18 MIL/uL (ref 4.40–5.90)
RDW: 14.6 % — AB (ref 11.5–14.5)
WBC: 19.1 10*3/uL — AB (ref 3.8–10.6)

## 2016-11-25 MED ORDER — LIDOCAINE HCL 2 % EX GEL
1.0000 "application " | Freq: Once | CUTANEOUS | Status: AC
  Administered 2016-11-25: 1 via URETHRAL

## 2016-11-25 MED ORDER — MORPHINE SULFATE (PF) 4 MG/ML IV SOLN
4.0000 mg | Freq: Once | INTRAVENOUS | Status: AC
  Administered 2016-11-25: 4 mg via INTRAVENOUS

## 2016-11-25 MED ORDER — TAMSULOSIN HCL 0.4 MG PO CAPS: 0.4000 mg | ORAL_CAPSULE | Freq: Every day | ORAL | 0 refills | Status: DC

## 2016-11-25 MED ORDER — ONDANSETRON HCL 4 MG/2ML IJ SOLN
INTRAMUSCULAR | Status: AC
  Administered 2016-11-25: 4 mg via INTRAVENOUS
  Filled 2016-11-25: qty 2

## 2016-11-25 MED ORDER — MORPHINE SULFATE (PF) 4 MG/ML IV SOLN
INTRAVENOUS | Status: AC
  Administered 2016-11-25: 4 mg via INTRAVENOUS
  Filled 2016-11-25: qty 1

## 2016-11-25 MED ORDER — TAMSULOSIN HCL 0.4 MG PO CAPS
0.4000 mg | ORAL_CAPSULE | Freq: Once | ORAL | Status: AC
  Administered 2016-11-25: 0.4 mg via ORAL
  Filled 2016-11-25: qty 1

## 2016-11-25 MED ORDER — LIDOCAINE HCL 2 % EX GEL
CUTANEOUS | Status: AC
Start: 2016-11-25 — End: 2016-11-25
  Administered 2016-11-25: 1 via URETHRAL
  Filled 2016-11-25: qty 10

## 2016-11-25 MED ORDER — ONDANSETRON HCL 4 MG/2ML IJ SOLN
4.0000 mg | Freq: Once | INTRAMUSCULAR | Status: AC
  Administered 2016-11-25: 4 mg via INTRAVENOUS

## 2016-11-25 NOTE — ED Notes (Signed)
Bladder scan showed greater than 850 ml in bladder.

## 2016-11-25 NOTE — ED Triage Notes (Signed)
Pt states penis, pelvic and low back pain. Pt states last urination at 1900. Pt states "it feels like I can't get it out". Pt is unsteady on feet. Pt denies fever, nausea, vomiting. Pt appears uncomfortable.

## 2016-11-25 NOTE — ED Notes (Signed)
Three attempts inserting a foley catherter have been unsuccessful, using 16 fr. 16 coude and 18 coude.

## 2016-11-25 NOTE — ED Provider Notes (Signed)
[facility] Emergency Department Provider Note  ____________________________________________   First MD Initiated Contact with Patient 11/25/16 2212     (approximate)  I have reviewed the triage vital signs and the nursing notes.   HISTORY  Chief Complaint Pelvic Pain    HPI Tyler Morgan is a 71 y.o. male history of present see emergency department with inability to urinate for the past 3 hours. He attempted to urinate at 7 PM but was unable to do so. He's had moderate to severe aching constant nonradiating lower pelvic pain. He does have a history of phimosis as well as 9 prostatic hypertrophy although is not taking any medications. He denies fevers or chills. Nothing makes his pain better or worse.   Past Medical History:  Diagnosis Date  . Diabetes mellitus without complication ([place])   . ED (erectile dysfunction)   . Hypertension     Patient Active Problem List   Diagnosis Date Noted  . Balanitis 10/11/2016  . Advanced care planning/counseling discussion 10/11/2016  . DM type 2 (diabetes mellitus, type 2) ([place]) 12/15/2014  . Calf muscle weakness 12/15/2014  . Hypertension     Past Surgical History:  Procedure Laterality Date  . CATARACT EXTRACTION Bilateral 1986, 2014  . EYE SURGERY Bilateral [id number]  . FRACTURE SURGERY Right    as a child/ right arm  . HERNIA REPAIR Right 01-13-14   inguinal hernia  . RETINAL DETACHMENT SURGERY  Jan 2015  . TONSILLECTOMY  1995    Prior to Admission medications   Medication Sig Start Date End Date Taking? Authorizing Provider  Alpha-Lipoic Acid 600 MG CAPS Take by mouth.   Yes [provider]  atenolol (TENORMIN) 100 MG tablet Take 1 tablet (100 mg total) by mouth daily. 10/11/16  Yes Crissman, Jeannette How, MD  B Complex Vitamins (VITAMIN B COMPLEX PO) Take 50 mg by mouth.   Yes [provider]  benazepril (LOTENSIN) 40 MG tablet Take 1 tablet (40 mg total) by mouth daily. 10/11/16   Yes Crissman, Jeannette How, MD  calcium carbonate (OS-CAL) 600 MG TABS tablet Take 600 mg by mouth 2 (two) times daily with a meal.   Yes [provider]  Cholecalciferol (VITAMIN D-3) 5000 UNITS TABS Take by mouth daily.    Yes [provider]  empagliflozin (JARDIANCE) 25 MG TABS tablet Take 25 mg by mouth daily. 10/11/16  Yes Crissman, Jeannette How, MD  hydrochlorothiazide (HYDRODIURIL) 25 MG tablet Take 1 tablet (25 mg total) by mouth daily. 10/11/16  Yes Guadalupe Maple, MD  metFORMIN (GLUCOPHAGE) 500 MG tablet Take 2 tablets (1,000 mg total) by mouth 2 (two) times daily with a meal. 10/11/16  Yes Crissman, Jeannette How, MD  Multiple Vitamins-Minerals (MULTIVITAMIN WITH MINERALS) tablet Take 1 tablet by mouth daily.   Yes [provider]  tamsulosin (FLOMAX) 0.4 MG CAPS capsule Take 1 capsule (0.4 mg total) by mouth daily. 11/25/16   Darel Hong, MD    Allergies Acetaminophen and Amlodipine  Family History  Problem Relation Age of Onset  . Colon cancer Father   . Angina Father   . Macular degeneration Father   . Alzheimer's disease Mother   . Prostate cancer Neg Hx   . Bladder Cancer Neg Hx   . Kidney cancer Neg Hx     Social History Social History  Substance Use Topics  . Smoking status: Former Smoker    Packs/day: 1.00    Years: 13.00    Types:  Cigarettes    Quit date: 02/21/1984  . Smokeless tobacco: Never Used  . Alcohol use 1.2 oz/week    2 Cans of beer per week    Review of Systems Constitutional: No fever/chills ENT: No sore throat. Cardiovascular: Denies chest pain. Respiratory: Denies shortness of breath. Gastrointestinal: positive for abdominal pain.  positive for nausea, no vomiting.  No diarrhea.  No constipation. Musculoskeletal: Negative for back pain. Neurological: Negative for headaches   ____________________________________________   PHYSICAL EXAM:  VITAL SIGNS: ED Triage Vitals  Enc Vitals Group     BP 11/25/16 2206 (!) 170/95      Pulse Rate 11/25/16 2206 66     Resp 11/25/16 2206 16     Temp 11/25/16 2206 98.2 F (36.8 C)     Temp Source 11/25/16 2206 Oral     SpO2 11/25/16 2206 96 %     Weight 11/25/16 2207 185 lb (83.9 kg)     Height 11/25/16 2207 6' (1.829 m)     Head Circumference --      Peak Flow --      Pain Score 11/25/16 2206 8     Pain Loc --      Pain Edu? --      Excl. in [place]? --     Constitutional: alert and oriented 4 appears quite uncomfortable Head: Atraumatic. Nose: No congestion/rhinnorhea. Mouth/Throat: No trismus Neck: No stridor.   Cardiovascular: regular rate and rhythm Respiratory: Normal respiratory effort.  No retractions. Gastrointestinal: suprapubic fullness and quite tender uncircumcised phallus with phimosis Neurologic:  Normal speech and language. No gross focal neurologic deficits are appreciated.  Skin:  Skin is warm, dry and intact. No rash noted.    ____________________________________________  LABS (all labs ordered are listed, but only abnormal results are displayed)  Labs Reviewed  CBC WITH DIFFERENTIAL/PLATELET - Abnormal; Notable for the following:       Result Value   WBC 19.1 (*)    RDW 14.6 (*)    Neutro Abs 17.2 (*)    Lymphs Abs 0.8 (*)    All other components within normal limits  URINALYSIS, COMPLETE (UACMP) WITH MICROSCOPIC  COMPREHENSIVE METABOLIC PANEL    blood work reviewed and interpreted by me shows elevated white count which is nonspecific and could be secondary to pain __________________________________________  EKG   ____________________________________________  RADIOLOGY   ____________________________________________   DIFFERENTIAL includes but not limited to     PROCEDURES  Procedure(s) performed: no  Procedures  Critical Care performed: no  Observation: no ____________________________________________   INITIAL IMPRESSION / ASSESSMENT AND PLAN / ED COURSE  Pertinent labs & imaging results that were  available during my care of the patient were reviewed by me and considered in my medical decision making (see chart for details).       ----------------------------------------- 10:55 PM on 11/25/2016 -----------------------------------------  Nursing attempted to place a 16 French Foley catheter without success, they then attempted to place a 20 [place] Foley catheter without success, and then attempted to place an 59 [place] Coude catheter without success.. I then attempted to place the 18 [place] Coude catheter without success.  I have a call out to urology now. ____________________________________________   ----------------------------------------- 11:45 PM on 11/25/2016 -----------------------------------------  Dr. Pilar Jarvis will kindly come evaluate the patient now.  FINAL CLINICAL IMPRESSION(S) / ED DIAGNOSES  Final diagnoses:  Acute urinary retention      NEW MEDICATIONS STARTED DURING THIS VISIT:  New Prescriptions   TAMSULOSIN (FLOMAX) 0.4 MG CAPS  CAPSULE    Take 1 capsule (0.4 mg total) by mouth daily.     Note:  This document was prepared using Dragon voice recognition software and may include unintentional dictation errors.      Darel Hong, MD 11/25/16 [phone]

## 2016-11-25 NOTE — ED Provider Notes (Signed)
No results found.   Labs Reviewed  COMPREHENSIVE METABOLIC PANEL - Abnormal; Notable for the following:       Result Value   CO2 21 (*)    Glucose, Bld 197 (*)    Creatinine, Ser 1.26 (*)    GFR calc non Af Amer 56 (*)    All other components within normal limits  CBC WITH DIFFERENTIAL/PLATELET - Abnormal; Notable for the following:    WBC 19.1 (*)    RDW 14.6 (*)    Neutro Abs 17.2 (*)    Lymphs Abs 0.8 (*)    All other components within normal limits  URINE CULTURE  URINALYSIS, COMPLETE (UACMP) WITH MICROSCOPIC     Clinical Course as of Nov 27 143  Sat Nov 25, 2016  2347 Assuming care from Dr. Mable Paris.  In short, Tyler Morgan is a 71 y.o. male with a chief complaint of urinary retention.  Refer to the original H&P for additional details.  The current plan of care is to follow up on UA after Dr. Pilar Jarvis places a Foley catheter.  Anticipate discharge with leg bag, +/- abx as indicated.   [CF]  Sun Nov 26, 2016  0113 Spoke in person with Dr. Pilar Jarvis who successfully placed a Foley.  He recommends Flomax and Keflex and the patient will go home with a leg bag.    [CF]  0144 Urinalysis and urine culture pending.  Regardless of the results, Dr. Pilar Jarvis wants me to send the patient home with Keflex, so I will not hold up the discharge for the results of the urinalysis.  I gave my usual and customary return precautions.     [CF]    Clinical Course User Index [CF] Hinda Kehr, MD     Final diagnoses:  Acute urinary retention      Hinda Kehr, MD 11/26/16 [id number]

## 2016-11-25 NOTE — Discharge Instructions (Addendum)
You were seen in the Emergency Department (ED) for urinary retention.  We placed a Foley catheter to help your bladder drain.  Please read through the included information and follow up with your doctor as recommended in these papers; your doctor will see you in clinic and help you determine when it is time to have the catheter removed.  If you stop producing urine in the bag or if you develop other symptoms that concern you, such as fever, chills, persistent vomiting, or severe abdominal pain, please return immediately to the Emergency Department.  ° °

## 2016-11-26 ENCOUNTER — Emergency Department
Admission: EM | Admit: 2016-11-26 | Discharge: 2016-11-26 | Disposition: A | Discharge status: Home / Self Care | Payer: Medicare Other | Source: Home / Self Care | Attending: Emergency Medicine | Admitting: Emergency Medicine

## 2016-11-26 DIAGNOSIS — R338 Other retention of urine: Secondary | ICD-10-CM

## 2016-11-26 DIAGNOSIS — Z7984 Long term (current) use of oral hypoglycemic drugs: Secondary | ICD-10-CM

## 2016-11-26 DIAGNOSIS — Z79899 Other long term (current) drug therapy: Secondary | ICD-10-CM

## 2016-11-26 DIAGNOSIS — I10 Essential (primary) hypertension: Secondary | ICD-10-CM | POA: Insufficient documentation

## 2016-11-26 DIAGNOSIS — E119 Type 2 diabetes mellitus without complications: Secondary | ICD-10-CM | POA: Insufficient documentation

## 2016-11-26 DIAGNOSIS — Z87891 Personal history of nicotine dependence: Secondary | ICD-10-CM

## 2016-11-26 DIAGNOSIS — Z960 Presence of urogenital implants: Secondary | ICD-10-CM

## 2016-11-26 DIAGNOSIS — R339 Retention of urine, unspecified: Secondary | ICD-10-CM | POA: Diagnosis not present

## 2016-11-26 DIAGNOSIS — N401 Enlarged prostate with lower urinary tract symptoms: Secondary | ICD-10-CM | POA: Diagnosis not present

## 2016-11-26 LAB — URINALYSIS, COMPLETE (UACMP) WITH MICROSCOPIC
BACTERIA UA: NONE SEEN
SQUAMOUS EPITHELIAL / LPF: NONE SEEN
Specific Gravity, Urine: 1.015 (ref 1.005–1.030)

## 2016-11-26 MED ORDER — CEPHALEXIN 500 MG PO CAPS
500.0000 mg | ORAL_CAPSULE | Freq: Once | ORAL | Status: AC
  Administered 2016-11-26: 500 mg via ORAL
  Filled 2016-11-26: qty 1

## 2016-11-26 MED ORDER — CEPHALEXIN 500 MG PO CAPS: 500.0000 mg | ORAL_CAPSULE | Freq: Two times a day (BID) | ORAL | 0 refills | Status: DC

## 2016-11-26 NOTE — Consult Note (Signed)
11/26/2016 1:16 AM   Tyler Morgan 1945-04-09 [id number]  Referring provider: Dr. Beckie Busing  CC: Urinary retention  HPI: The patient is a 70 year old gentleman with a history of phimosis with a pending circumcision in the future presented to the emergency department with the inability urinate. Attempts were made to place a Foley catheter by the emergency room staff which was unsuccessful. Urology is consulted for placement of Foley catheter and a patient with acute urinary retention who is urgently uncomfortable. The patient does have a history of elevated PSA with negative biopsy. His most recent PSA was 8.5. This is down from 12.5 the year prior. He was told he has a grapefruit-sized prostate. Prior to this he had urinary symptoms include nocturia 4, hesitancy, urgency, intermittency. He has never had an issue with urinary retention before.   PMH: Past Medical History:  Diagnosis Date  . Diabetes mellitus without complication ([place])   . ED (erectile dysfunction)   . Hypertension     Surgical History: Past Surgical History:  Procedure Laterality Date  . CATARACT EXTRACTION Bilateral 1986, 2014  . EYE SURGERY Bilateral [id number]  . FRACTURE SURGERY Right    as a child/ right arm  . HERNIA REPAIR Right 01-13-14   inguinal hernia  . RETINAL DETACHMENT SURGERY  Jan 2015  . TONSILLECTOMY  1995     Allergies:  Allergies  Allergen Reactions  . Acetaminophen Swelling    Swelling of liver  swelling   . Amlodipine Swelling    Family History: Family History  Problem Relation Age of Onset  . Colon cancer Father   . Angina Father   . Macular degeneration Father   . Alzheimer's disease Mother   . Prostate cancer Neg Hx   . Bladder Cancer Neg Hx   . Kidney cancer Neg Hx     Social History:  reports that he quit smoking about 32 years ago. His smoking use included Cigarettes. He has a 13.00 pack-year smoking history. He has never used smokeless tobacco. He  reports that he drinks about 1.2 oz of alcohol per week . He reports that he does not use drugs.  ROS: 12 point ROS negative except for above  Physical Exam: BP (!) 164/99   Pulse 79   Temp 98.2 F (36.8 C) (Oral)   Resp 16   Ht 6' (1.829 m)   Wt 185 lb (83.9 kg)   SpO2 93%   BMI 25.09 kg/m   Constitutional:  Alert and oriented, No acute distress. HEENT: [place] AT, moist mucus membranes.  Trachea midline, no masses. Cardiovascular: No clubbing, cyanosis, or edema. Respiratory: Normal respiratory effort, no increased work of breathing. GI: Abdomen is soft, nontender, nondistended, no abdominal masses GU: No CVA tenderness. Uncircumcised phallus. Phimosis. Foreskin unreducible. Normal testicles. Benign. Skin: No rashes, bruises or suspicious lesions. Lymph: No cervical or inguinal adenopathy. Neurologic: Grossly intact, no focal deficits, moving all 4 extremities. Psychiatric: Normal mood and affect.  Laboratory Data: Lab Results  Component Value Date   WBC 19.1 (H) 11/25/2016   HGB 15.3 11/25/2016   HCT 45.1 11/25/2016   MCV 87.1 11/25/2016   PLT 247 11/25/2016    Lab Results  Component Value Date   CREATININE 1.26 (H) 11/25/2016    No results found for: PSA  No results found for: TESTOSTERONE  Lab Results  Component Value Date   HGBA1C 8.2 07/21/2015    Urinalysis    Component Value Date/Time   APPEARANCEUR Cloudy (A) 10/11/2016 1555  GLUCOSEU 2+ (A) 10/11/2016 1555   BILIRUBINUR Negative 10/11/2016 1555   PROTEINUR Negative 10/11/2016 1555   NITRITE Negative 10/11/2016 1555   LEUKOCYTESUR Negative 10/11/2016 1555   Procedure: The patient was prepped and draped in usual sterile fashion. Attempt was made to place a 16 French silicone catheter which was unsuccessful. A flexible cystoscopy was then used to navigate into the bladder passed a false passage posteriorly. A sensor was then placed. Over this a 37 French [place] catheter was placed and the sensor wire  was removed. There was return of clear yellow urine. Patient had immediate relief of his pain.  Assessment & Plan:    1. BPH with urinary retention Foley catheter was placed with the aid of a flexible cystoscope without difficulty. He had immediate return of a large amount of urine. The patient will be started on Flomax. We'll also treat him for a possible urinary tract infection given his elevated white count. I will arrange for him to follow-up in my office in approximately one week for a trial of void. If this is successful, I will have him follow back up with me in 1 month for a postvoid residual.  [name], [place] [address] [place], [place] [address] [phone]

## 2016-11-26 NOTE — ED Triage Notes (Signed)
Pt seen last night for urinary retention, had catheter put in, reports now not draining and having extreme pain.

## 2016-11-26 NOTE — Discharge Instructions (Signed)
You were evaluated after inability to urinate, and then were able to urinate here in the ED through your Foley catheter. As we discussed, I suspect a blood clot was occluding and then cleared away.  Return to the emergency room immediately for any worsening abdominal pain, any fevers, decreased or no urine drainage, or any other symptoms concerning to you.

## 2016-11-26 NOTE — ED Notes (Signed)
Patient given extra leg bag, discussed return precautions.  Catheter is flowing well at this time.

## 2016-11-26 NOTE — ED Notes (Signed)
Pt. And pt. Wife given instructions on foley and foley leg bag.

## 2016-11-26 NOTE — ED Provider Notes (Signed)
[facility] Emergency Department Provider Note ____________________________________________   I have reviewed the triage vital signs and the triage nursing note.  HISTORY  Chief Complaint Urinary Retention   Historian Patient And wife  HPI Tyler Morgan is a 71 y.o. male with a history of acute urinary retention treated last night by urology placement of a Foley catheter around 1 AM. Patient was sent home on Flomax with a leg bag and on Keflex for possible urinary tract infection. There is plenty of blood at that point in time. He is not on blood thinners.  This morning the patient had coffee as well as his diuretic, and realized his leg bag was not draining and he started to develop recurrence of suprapubic pain and spasms.  During the patient's emergency department check-in he started to notice a small amount of drainage and over the course of the ED visit, the leg bag did drain and the patient was no longer having pain.    Past Medical History:  Diagnosis Date  . Diabetes mellitus without complication ([place])   . ED (erectile dysfunction)   . Hypertension     Patient Active Problem List   Diagnosis Date Noted  . Balanitis 10/11/2016  . Advanced care planning/counseling discussion 10/11/2016  . DM type 2 (diabetes mellitus, type 2) ([place]) 12/15/2014  . Calf muscle weakness 12/15/2014  . Hypertension     Past Surgical History:  Procedure Laterality Date  . CATARACT EXTRACTION Bilateral 1986, 2014  . EYE SURGERY Bilateral [id number]  . FRACTURE SURGERY Right    as a child/ right arm  . HERNIA REPAIR Right 01-13-14   inguinal hernia  . RETINAL DETACHMENT SURGERY  Jan 2015  . TONSILLECTOMY  1995    Prior to Admission medications   Medication Sig Start Date End Date Taking? Authorizing Provider  Alpha-Lipoic Acid 600 MG CAPS Take by mouth.    [provider]  atenolol (TENORMIN) 100 MG tablet Take 1 tablet (100 mg total) by mouth  daily. 10/11/16   Guadalupe Maple, MD  B Complex Vitamins (VITAMIN B COMPLEX PO) Take 50 mg by mouth.    [provider]  benazepril (LOTENSIN) 40 MG tablet Take 1 tablet (40 mg total) by mouth daily. 10/11/16   Guadalupe Maple, MD  calcium carbonate (OS-CAL) 600 MG TABS tablet Take 600 mg by mouth 2 (two) times daily with a meal.    [provider]  cephALEXin (KEFLEX) 500 MG capsule Take 1 capsule (500 mg total) by mouth 2 (two) times daily. 11/26/16   Hinda Kehr, MD  Cholecalciferol (VITAMIN D-3) 5000 UNITS TABS Take by mouth daily.     [provider]  empagliflozin (JARDIANCE) 25 MG TABS tablet Take 25 mg by mouth daily. 10/11/16   Guadalupe Maple, MD  hydrochlorothiazide (HYDRODIURIL) 25 MG tablet Take 1 tablet (25 mg total) by mouth daily. 10/11/16   Guadalupe Maple, MD  metFORMIN (GLUCOPHAGE) 500 MG tablet Take 2 tablets (1,000 mg total) by mouth 2 (two) times daily with a meal. 10/11/16   Crissman, Jeannette How, MD  Multiple Vitamins-Minerals (MULTIVITAMIN WITH MINERALS) tablet Take 1 tablet by mouth daily.    [provider]  tamsulosin (FLOMAX) 0.4 MG CAPS capsule Take 1 capsule (0.4 mg total) by mouth daily. 11/25/16   Darel Hong, MD    Allergies  Allergen Reactions  . Acetaminophen Swelling    Swelling of liver  swelling   . Amlodipine Swelling  Family History  Problem Relation Age of Onset  . Colon cancer Father   . Angina Father   . Macular degeneration Father   . Alzheimer's disease Mother   . Prostate cancer Neg Hx   . Bladder Cancer Neg Hx   . Kidney cancer Neg Hx     Social History Social History  Substance Use Topics  . Smoking status: Former Smoker    Packs/day: 1.00    Years: 13.00    Types: Cigarettes    Quit date: 02/21/1984  . Smokeless tobacco: Never Used  . Alcohol use 1.2 oz/week    2 Cans of beer per week    Review of Systems  Constitutional: Negative for fever. Eyes: Negative for visual changes. ENT:  Negative for sore throat. Cardiovascular: Negative for chest pain. Respiratory: Negative for shortness of breath. Gastrointestinal: Negative for abdominal pain, vomiting and diarrhea. Genitourinary: obstructive urinary symptoms as per history of present illness Musculoskeletal: Negative for back pain. Skin: Negative for rash. Neurological: Negative for headache.  ____________________________________________   PHYSICAL EXAM:  VITAL SIGNS: ED Triage Vitals  Enc Vitals Group     BP 11/26/16 1309 131/79     Pulse Rate 11/26/16 1309 85     Resp 11/26/16 1309 16     Temp 11/26/16 1309 98.4 F (36.9 C)     Temp Source 11/26/16 1309 Oral     SpO2 11/26/16 1309 100 %     Weight 11/26/16 1309 185 lb (83.9 kg)     Height 11/26/16 1309 6' (1.829 m)     Head Circumference --      Peak Flow --      Pain Score 11/26/16 1315 2     Pain Loc --      Pain Edu? --      Excl. in [place]? --      Constitutional: Alert and oriented. Well appearing and in no distress. HEENT   Head: Normocephalic and atraumatic.      Eyes: Conjunctivae are normal. Pupils equal and round.       Ears:         Nose: No congestion/rhinnorhea.   Mouth/Throat: Mucous membranes are moist.   Neck: No stridor. Cardiovascular/Chest: Normal rate, regular rhythm.  No murmurs, rubs, or gallops. Respiratory: Normal respiratory effort without tachypnea nor retractions. Breath sounds are clear and equal bilaterally. No wheezes/rales/rhonchi. Gastrointestinal: Soft. No distention, no guarding, no rebound. Nontender.  Genitourinary/rectal: Foley catheter in place. Musculoskeletal: Nontender with normal range of motion in all extremities. No joint effusions.  No lower extremity tenderness.  No edema. Neurologic:  Normal speech and language. No gross or focal neurologic deficits are appreciated. Skin:  Skin is warm, dry and intact. No rash noted. Psychiatric: Mood and affect are normal. Speech and behavior are normal.  Patient exhibits appropriate insight and judgment.   ____________________________________________  LABS (pertinent positives/negatives) I, Lisa Roca, MD the attending physician have reviewed the labs noted below.  Labs Reviewed - No data to display  ____________________________________________    EKG I, Lisa Roca, MD, the attending physician have personally viewed and interpreted all ECGs.  none ____________________________________________  RADIOLOGY All Xrays were viewed by me.  Imaging interpreted by Radiologist, and I, Lisa Roca, MD the attending physician have reviewed the radiologist interpretation noted below.  none __________________________________________  PROCEDURES  Procedure(s) performed: None  Critical Care performed: None  ____________________________________________  No current facility-administered medications on file prior to encounter.    Current Outpatient Prescriptions on File  Prior to Encounter  Medication Sig Dispense Refill  . Alpha-Lipoic Acid 600 MG CAPS Take by mouth.    Marland Kitchen atenolol (TENORMIN) 100 MG tablet Take 1 tablet (100 mg total) by mouth daily. 90 tablet 4  . B Complex Vitamins (VITAMIN B COMPLEX PO) Take 50 mg by mouth.    . benazepril (LOTENSIN) 40 MG tablet Take 1 tablet (40 mg total) by mouth daily. 90 tablet 4  . calcium carbonate (OS-CAL) 600 MG TABS tablet Take 600 mg by mouth 2 (two) times daily with a meal.    . cephALEXin (KEFLEX) 500 MG capsule Take 1 capsule (500 mg total) by mouth 2 (two) times daily. 14 capsule 0  . Cholecalciferol (VITAMIN D-3) 5000 UNITS TABS Take by mouth daily.     . empagliflozin (JARDIANCE) 25 MG TABS tablet Take 25 mg by mouth daily. 90 tablet 4  . hydrochlorothiazide (HYDRODIURIL) 25 MG tablet Take 1 tablet (25 mg total) by mouth daily. 90 tablet 4  . metFORMIN (GLUCOPHAGE) 500 MG tablet Take 2 tablets (1,000 mg total) by mouth 2 (two) times daily with a meal. 360 tablet 4  . Multiple  Vitamins-Minerals (MULTIVITAMIN WITH MINERALS) tablet Take 1 tablet by mouth daily.    . tamsulosin (FLOMAX) 0.4 MG CAPS capsule Take 1 capsule (0.4 mg total) by mouth daily. 30 capsule 0    ____________________________________________  ED COURSE / ASSESSMENT AND PLAN  Pertinent labs & imaging results that were available during my care of the patient were reviewed by me and considered in my medical decision making (see chart for details).    patient presented with symptoms of acute urinary retention despite having a Foley catheter in place since this morning overnight around 1 AM. However before I saw him, the urinary obstruction was relieved and patient was having urine flow through his catheter. A few blood clots had finished training. I suspect he had blood clots that were obstructing a urinary catheter, which are now relieved.  I discussed with patient and family that don't really expect this to occur given that he is not on ongoing blood thinners, and they urine has really cleared up in terms of the blood from the trauma of yesterday.  He is not having additional symptoms of sepsis or worsening systemic issues. I reviewed his urinalysis which was nonspecific, but patient is on Keflex. I reviewed the urine culture which has not resulted from this morning at 1 AM yet.  DIFFERENTIAL DIAGNOSIS: including but not limited to mechanical failure of the Foley catheter, catheter displacement, blood clot, acute urinary retention, urinary tract infection, other sources of abdominal pain.  CONSULTATIONS:   none   Patient / Family / Caregiver informed of clinical course, medical decision-making process, and agree with plan.   I discussed return precautions, follow-up instructions, and discharge instructions with patient and/or family.  Discharge Instructions :  You were evaluated after inability to urinate, and then were able to urinate here in the ED through your Foley catheter. As we discussed,  I suspect a blood clot was occluding and then cleared away.  Return to the emergency room immediately for any worsening abdominal pain, any fevers, decreased or no urine drainage, or any other symptoms concerning to you.  ___________________________________________   FINAL CLINICAL IMPRESSION(S) / ED DIAGNOSES   Final diagnoses:  Acute urinary retention              Note: This dictation was prepared with Dragon dictation. Any transcriptional errors that result from  this process are unintentional    Lisa Roca, MD 11/26/16 1439

## 2016-11-26 NOTE — ED Notes (Signed)
Urologist in room with patient.

## 2016-11-27 ENCOUNTER — Telehealth: Payer: Self-pay

## 2016-11-27 LAB — URINE CULTURE
CULTURE: NO GROWTH
SPECIAL REQUESTS: NORMAL

## 2016-11-27 NOTE — Telephone Encounter (Signed)
-----   Message from [name], MD sent at 11/26/2016  1:14 AM EDT ----- Patient needs AM trial of void on December 04, 2016. Needs to see me one month later if he passes. Thanks.

## 2016-11-28 ENCOUNTER — Telehealth: Payer: Self-pay | Admitting: Urology

## 2016-11-28 NOTE — Telephone Encounter (Signed)
done

## 2016-11-28 NOTE — Telephone Encounter (Signed)
-----   Message from [name], MD sent at 11/26/2016  1:14 AM EDT ----- Patient needs AM trial of void on December 04, 2016. Needs to see me one month later if he passes. Thanks.

## 2016-12-04 ENCOUNTER — Ambulatory Visit (INDEPENDENT_AMBULATORY_CARE_PROVIDER_SITE_OTHER): Payer: Medicare Other

## 2016-12-04 VITALS — BP 115/74 | HR 69 | Ht 72.0 in | Wt 185.2 lb

## 2016-12-04 DIAGNOSIS — N471 Phimosis: Secondary | ICD-10-CM

## 2016-12-04 NOTE — Progress Notes (Signed)
Fill and Pull Catheter Removal  Patient is present today for a catheter removal.  Patient was cleaned and prepped in a sterile fashion 285ml of sterile water/ saline was instilled into the bladder when the patient felt the urge to urinate. 65ml of water was then drained from the balloon.  A 16FR foley cath was removed from the bladder no complications were noted .  Patient was then given some time to void on their own.  Patient can void  191ml on their own after some time.  Patient tolerated well.  Preformed by: C. Corinna Capra, CMA  Follow up/ Additional notes: Patient was concern when he urinated that it took a long time and his stream was a lot of "dribbles". Instructed pt to go home drink plenty of fluids and if any further concerns, return to office at 2 p.m for bladder scan, otherwise contact office this afternoon to schedule 1 month f/u. Patient and spouse verbalized understanding.

## 2016-12-05 ENCOUNTER — Ambulatory Visit (INDEPENDENT_AMBULATORY_CARE_PROVIDER_SITE_OTHER): Payer: Medicare Other

## 2016-12-05 VITALS — BP 155/81 | HR 79 | Ht 73.0 in | Wt 185.8 lb

## 2016-12-05 DIAGNOSIS — N471 Phimosis: Secondary | ICD-10-CM

## 2016-12-05 NOTE — Progress Notes (Signed)
Pt presented today with c/o having trouble emptying bladder that started over night. Pt stated that yesterday after voiding trial things were going great until about 11:30pm then he was only able to urinate drops. Pt PVR was >388 at nurse visit. Pt has severe phimosis.Per Dr. Junious Silk CIC could be attempted and if failed replace foley. Pt attempted to perform CIC with a flex pro and compact male and was unsuccessful. Pt developed a lot of thick bright red blood when attempting CIC. Therefore 16 fr coude foley was replaced without difficulty. Clear yellow urine and no blood was noted with foley placement. Pt tolerated all procedures well. At check out pt made a f/u appt with Dr. Pilar Jarvis to discuss what to do next in reference to urinary retention and phimosis.  Blood pressure (!) 155/81, pulse 79, height 6\' 1"  (1.854 m), weight 185 lb 12.8 oz (84.3 kg).

## 2016-12-14 DIAGNOSIS — E559 Vitamin D deficiency, unspecified: Secondary | ICD-10-CM | POA: Diagnosis not present

## 2016-12-14 DIAGNOSIS — M6281 Muscle weakness (generalized): Secondary | ICD-10-CM | POA: Diagnosis not present

## 2016-12-14 DIAGNOSIS — E538 Deficiency of other specified B group vitamins: Secondary | ICD-10-CM | POA: Diagnosis not present

## 2016-12-15 ENCOUNTER — Encounter: Payer: Self-pay | Admitting: Urology

## 2016-12-15 ENCOUNTER — Ambulatory Visit (INDEPENDENT_AMBULATORY_CARE_PROVIDER_SITE_OTHER): Payer: Medicare Other | Admitting: Urology

## 2016-12-15 ENCOUNTER — Other Ambulatory Visit: Payer: Self-pay | Admitting: Neurology

## 2016-12-15 VITALS — BP 139/80 | HR 67 | Ht 73.0 in | Wt 187.0 lb

## 2016-12-15 DIAGNOSIS — N471 Phimosis: Secondary | ICD-10-CM | POA: Diagnosis not present

## 2016-12-15 DIAGNOSIS — R338 Other retention of urine: Secondary | ICD-10-CM | POA: Diagnosis not present

## 2016-12-15 DIAGNOSIS — N401 Enlarged prostate with lower urinary tract symptoms: Secondary | ICD-10-CM | POA: Diagnosis not present

## 2016-12-15 DIAGNOSIS — M6281 Muscle weakness (generalized): Secondary | ICD-10-CM

## 2016-12-15 MED ORDER — TAMSULOSIN HCL 0.4 MG PO CAPS: 0.8000 mg | ORAL_CAPSULE | Freq: Every day | ORAL | 11 refills | Status: DC

## 2016-12-15 MED ORDER — FINASTERIDE 5 MG PO TABS: 5.0000 mg | ORAL_TABLET | Freq: Every day | ORAL | 11 refills | Status: DC

## 2016-12-15 NOTE — Progress Notes (Signed)
12/15/2016 1:38 PM   Tyler Morgan [birth year] [id number]  Referring provider: Guadalupe Maple, MD [address] [place], [place] [address]  Chief Complaint  Patient presents with  . Follow-up    Phimosis    HPI: The patient is a 71 year old gentleman with past medical history significant for diabetes presents today for urinary retention.  1.  Urinary retention Patient was found to be in urinary retention on November 26, 2016.  He required cystoscopic placement of Foley catheter. He was started on flomax at that time. He underwent a trial of void December 05, 2016 which was unsuccessful.  Foley catheter was replaced.  The patient does have a history of elevated PSA with negative biopsy. His most recent PSA was 8.5. This is down from 12.5 the year prior. He was told he has a grapefruit-sized prostate.   Prior to this he had urinary symptoms include nocturia 4, hesitancy, urgency, intermittency.  2.  Phimosis The patient has phimosis.  He is planning to undergo circumcision when it is convenient for his schedule sometime in the spring 2019.   PMH: Past Medical History:  Diagnosis Date  . Diabetes mellitus without complication ([place])   . ED (erectile dysfunction)   . Hypertension     Surgical History: Past Surgical History:  Procedure Laterality Date  . CATARACT EXTRACTION Bilateral 1986, 2014  . EYE SURGERY Bilateral [id number]  . FRACTURE SURGERY Right    as a child/ right arm  . HERNIA REPAIR Right 01-13-14   inguinal hernia  . RETINAL DETACHMENT SURGERY  Jan 2015  . TONSILLECTOMY  1995    Home Medications:  Allergies as of 12/15/2016      Reactions   Acetaminophen Swelling   Swelling of liver  swelling   Amlodipine Swelling      Medication List       Accurate as of 12/15/16  1:38 PM. Always use your most recent med list.          Alpha-Lipoic Acid 600 MG Caps Take by mouth.   atenolol 100 MG tablet Commonly known as:  TENORMIN Take 1 tablet  (100 mg total) by mouth daily.   benazepril 40 MG tablet Commonly known as:  LOTENSIN Take 1 tablet (40 mg total) by mouth daily.   calcium carbonate 600 MG Tabs tablet Commonly known as:  OS-CAL Take 600 mg by mouth 2 (two) times daily with a meal.   empagliflozin 25 MG Tabs tablet Commonly known as:  JARDIANCE Take 25 mg by mouth daily.   finasteride 5 MG tablet Commonly known as:  PROSCAR Take 1 tablet (5 mg total) by mouth daily.   hydrochlorothiazide 25 MG tablet Commonly known as:  HYDRODIURIL Take 1 tablet (25 mg total) by mouth daily.   metFORMIN 500 MG tablet Commonly known as:  GLUCOPHAGE Take 2 tablets (1,000 mg total) by mouth 2 (two) times daily with a meal.   multivitamin with minerals tablet Take 1 tablet by mouth daily.   tamsulosin 0.4 MG Caps capsule Commonly known as:  FLOMAX Take 1 capsule (0.4 mg total) by mouth daily.   tamsulosin 0.4 MG Caps capsule Commonly known as:  FLOMAX Take 2 capsules (0.8 mg total) by mouth daily.   VITAMIN B COMPLEX PO Take 50 mg by mouth.   Vitamin D-3 5000 units Tabs Take by mouth daily.       Allergies:  Allergies  Allergen Reactions  . Acetaminophen Swelling    Swelling of liver  swelling   . Amlodipine Swelling    Family History: Family History  Problem Relation Age of Onset  . Colon cancer Father   . Angina Father   . Macular degeneration Father   . Alzheimer's disease Mother   . Prostate cancer Neg Hx   . Bladder Cancer Neg Hx   . Kidney cancer Neg Hx     Social History:  reports that he quit smoking about 32 years ago. His smoking use included Cigarettes. He has a 13.00 pack-year smoking history. He has never used smokeless tobacco. He reports that he drinks about 1.2 oz of alcohol per week . He reports that he does not use drugs.  ROS: UROLOGY Frequent Urination?: No Hard to postpone urination?: No Burning/pain with urination?: No Get up at night to urinate?: No Leakage of urine?:  No Urine stream starts and stops?: No Trouble starting stream?: No Do you have to strain to urinate?: No Blood in urine?: Yes Urinary tract infection?: No Sexually transmitted disease?: No Injury to kidneys or bladder?: No Painful intercourse?: No Weak stream?: No Erection problems?: No Penile pain?: Yes  Gastrointestinal Nausea?: No Vomiting?: No Indigestion/heartburn?: No Diarrhea?: No Constipation?: No  Constitutional Fever: No Night sweats?: No Weight loss?: No Fatigue?: No  Skin Skin rash/lesions?: No Itching?: No  Eyes Blurred vision?: No Double vision?: No  Ears/Nose/Throat Sore throat?: No Sinus problems?: No  Hematologic/Lymphatic Swollen glands?: No Easy bruising?: No  Cardiovascular Leg swelling?: No Chest pain?: No  Respiratory Cough?: No Shortness of breath?: No  Endocrine Excessive thirst?: No  Musculoskeletal Back pain?: No Joint pain?: No  Neurological Headaches?: No Dizziness?: No  Psychologic Depression?: No Anxiety?: No  Physical Exam: BP 139/80 (BP Location: Right Arm, Patient Position: Sitting, Cuff Size: Normal)   Pulse 67   Ht 6\' 1"  (1.854 m)   Wt 187 lb (84.8 kg)   BMI 24.67 kg/m   Constitutional:  Alert and oriented, No acute distress. HEENT: [place] AT, moist mucus membranes.  Trachea midline, no masses. Cardiovascular: No clubbing, cyanosis, or edema. Respiratory: Normal respiratory effort, no increased work of breathing. GI: Abdomen is soft, nontender, nondistended, no abdominal masses GU: No CVA tenderness.  Skin: No rashes, bruises or suspicious lesions. Lymph: No cervical or inguinal adenopathy. Neurologic: Grossly intact, no focal deficits, moving all 4 extremities. Psychiatric: Normal mood and affect.  Laboratory Data: Lab Results  Component Value Date   WBC 19.1 (H) 11/25/2016   HGB 15.3 11/25/2016   HCT 45.1 11/25/2016   MCV 87.1 11/25/2016   PLT 247 11/25/2016    Lab Results  Component Value  Date   CREATININE 1.26 (H) 11/25/2016    No results found for: PSA  No results found for: TESTOSTERONE  Lab Results  Component Value Date   HGBA1C 8.2 07/21/2015    Urinalysis    Component Value Date/Time   COLORURINE RED (A) 11/26/2016 0111   APPEARANCEUR CLOUDY (A) 11/26/2016 0111   APPEARANCEUR Cloudy (A) 10/11/2016 1555   LABSPEC 1.015 11/26/2016 0111   PHURINE  11/26/2016 0111    TEST NOT REPORTED DUE TO COLOR INTERFERENCE OF URINE PIGMENT   GLUCOSEU (A) 11/26/2016 0111    TEST NOT REPORTED DUE TO COLOR INTERFERENCE OF URINE PIGMENT   HGBUR (A) 11/26/2016 0111    TEST NOT REPORTED DUE TO COLOR INTERFERENCE OF URINE PIGMENT   BILIRUBINUR (A) 11/26/2016 0111    TEST NOT REPORTED DUE TO COLOR INTERFERENCE OF URINE PIGMENT   BILIRUBINUR Negative 10/11/2016 1555  KETONESUR (A) 11/26/2016 0111    TEST NOT REPORTED DUE TO COLOR INTERFERENCE OF URINE PIGMENT   PROTEINUR (A) 11/26/2016 0111    TEST NOT REPORTED DUE TO COLOR INTERFERENCE OF URINE PIGMENT   NITRITE (A) 11/26/2016 0111    TEST NOT REPORTED DUE TO COLOR INTERFERENCE OF URINE PIGMENT   LEUKOCYTESUR (A) 11/26/2016 0111    TEST NOT REPORTED DUE TO COLOR INTERFERENCE OF URINE PIGMENT   LEUKOCYTESUR Negative 10/11/2016 1555    Assessment & Plan:    1. BPH with urinary retention -increase flomax to 0.8 mg daily, add finasteride -will plan for AM TOV early next week with the nurses -if this is unsuccessful, he will need to follow up with me for cystoscopy  2. Phimosis -plan for circumcision once above is addressed and convenient for patient   [name], [facility] [address], [place],  [address] [phone]

## 2016-12-18 ENCOUNTER — Ambulatory Visit: Payer: Medicare Other

## 2016-12-18 VITALS — BP 136/77 | HR 60 | Ht 73.0 in | Wt 183.5 lb

## 2016-12-18 DIAGNOSIS — R339 Retention of urine, unspecified: Secondary | ICD-10-CM

## 2016-12-18 LAB — BLADDER SCAN AMB NON-IMAGING: Scan Result: 200

## 2016-12-18 NOTE — Progress Notes (Unsigned)
Fill and Pull Catheter Removal  Patient is present today for a catheter removal.  Patient was cleaned and prepped in a sterile fashion 265ml of sterile water/ saline was instilled into the bladder when the patient felt the urge to urinate. 28ml of water was then drained from the balloon.  A 16FR foley cath was removed from the bladder no complications were noted .  Patient as then given some time to void on their own.  Patient can void  258ml on their own after some time.  Patient tolerated well. Patient concerned about having urinary retention later. Advised pt to return for bladder around 3pm today. Patient agreed and verbalized understanding.   Preformed by: C. Corinna Capra, CMA  Follow up/ Additional notes: ***

## 2016-12-20 ENCOUNTER — Ambulatory Visit: Payer: Medicare Other

## 2016-12-21 ENCOUNTER — Encounter: Payer: Self-pay | Admitting: Emergency Medicine

## 2016-12-21 ENCOUNTER — Emergency Department
Admission: EM | Admit: 2016-12-21 | Discharge: 2016-12-22 | Disposition: A | Discharge status: Home / Self Care | Payer: Medicare Other | Attending: Emergency Medicine | Admitting: Emergency Medicine

## 2016-12-21 DIAGNOSIS — R339 Retention of urine, unspecified: Secondary | ICD-10-CM | POA: Diagnosis present

## 2016-12-21 DIAGNOSIS — Z79899 Other long term (current) drug therapy: Secondary | ICD-10-CM | POA: Insufficient documentation

## 2016-12-21 DIAGNOSIS — R300 Dysuria: Secondary | ICD-10-CM | POA: Diagnosis not present

## 2016-12-21 DIAGNOSIS — Z87891 Personal history of nicotine dependence: Secondary | ICD-10-CM | POA: Diagnosis not present

## 2016-12-21 DIAGNOSIS — Z7984 Long term (current) use of oral hypoglycemic drugs: Secondary | ICD-10-CM | POA: Diagnosis not present

## 2016-12-21 DIAGNOSIS — I10 Essential (primary) hypertension: Secondary | ICD-10-CM | POA: Diagnosis not present

## 2016-12-21 DIAGNOSIS — E119 Type 2 diabetes mellitus without complications: Secondary | ICD-10-CM | POA: Diagnosis not present

## 2016-12-21 LAB — URINALYSIS, COMPLETE (UACMP) WITH MICROSCOPIC
BACTERIA UA: NONE SEEN
BILIRUBIN URINE: NEGATIVE
Glucose, UA: >=500 mg/dL — AB
Ketones, ur: 20 mg/dL — AB
Leukocytes, UA: NEGATIVE
NITRITE: NEGATIVE
PH: 5 (ref 5.0–8.0)
Protein, ur: NEGATIVE mg/dL
SPECIFIC GRAVITY, URINE: 1.038 — AB (ref 1.005–1.030)

## 2016-12-21 MED ORDER — PHENAZOPYRIDINE HCL 200 MG PO TABS: 200.0000 mg | ORAL_TABLET | Freq: Three times a day (TID) | ORAL | 0 refills | Status: DC | PRN

## 2016-12-21 MED ORDER — CEPHALEXIN 500 MG PO CAPS
500.0000 mg | ORAL_CAPSULE | Freq: Once | ORAL | Status: AC
  Administered 2016-12-22: 500 mg via ORAL
  Filled 2016-12-21: qty 1

## 2016-12-21 MED ORDER — CEPHALEXIN 500 MG PO CAPS: 500.0000 mg | ORAL_CAPSULE | Freq: Four times a day (QID) | ORAL | 0 refills | Status: AC

## 2016-12-21 MED ORDER — PHENAZOPYRIDINE HCL 200 MG PO TABS
200.0000 mg | ORAL_TABLET | Freq: Once | ORAL | Status: AC
  Administered 2016-12-22: 200 mg via ORAL
  Filled 2016-12-21: qty 1

## 2016-12-21 NOTE — ED Provider Notes (Signed)
[facility] Emergency Department Provider Note  ____________________________________________   First MD Initiated Contact with Patient 12/21/16 2324     (approximate)  I have reviewed the triage vital signs and the nursing notes.   HISTORY  Chief Complaint Urinary Retention    HPI Tyler Morgan is a 71 y.o. male who self presents to the emergency department with roughly 24 hours of progressive dysuria.  He has a past medical history of BPH and has had acute urinary retention twice over the past several weeks each time requiring a cystoscopy to pass a Foley catheter secondary to multiple falls tracts.  He had a temperature of 99.7 today along with some malaise although no flank pain.  Today he called his urologist office and the on-call nurse advised him to come to the emergency department to "be seen by a urologist within 4 hours".  The patient's symptoms are currently mild to moderate.  They were insidious in onset and slowly progressive.  They seem to be worsened by urinating and improved by not urinating.  Past Medical History:  Diagnosis Date  . Diabetes mellitus without complication ([place])   . ED (erectile dysfunction)   . Hypertension     Patient Active Problem List   Diagnosis Date Noted  . Balanitis 10/11/2016  . Advanced care planning/counseling discussion 10/11/2016  . DM type 2 (diabetes mellitus, type 2) ([place]) 12/15/2014  . Calf muscle weakness 12/15/2014  . Hypertension     Past Surgical History:  Procedure Laterality Date  . CATARACT EXTRACTION Bilateral 1986, 2014  . EYE SURGERY Bilateral [id number]  . FRACTURE SURGERY Right    as a child/ right arm  . HERNIA REPAIR Right 01-13-14   inguinal hernia  . RETINAL DETACHMENT SURGERY  Jan 2015  . TONSILLECTOMY  1995    Prior to Admission medications   Medication Sig Start Date End Date Taking? Authorizing Provider  Alpha-Lipoic Acid 600 MG CAPS Take by mouth.    [provider]  atenolol (TENORMIN) 100 MG tablet Take 1 tablet (100 mg total) by mouth daily. 10/11/16   Guadalupe Maple, MD  B Complex Vitamins (VITAMIN B COMPLEX PO) Take 50 mg by mouth.    [provider]  benazepril (LOTENSIN) 40 MG tablet Take 1 tablet (40 mg total) by mouth daily. 10/11/16   Guadalupe Maple, MD  calcium carbonate (OS-CAL) 600 MG TABS tablet Take 600 mg by mouth 2 (two) times daily with a meal.    [provider]  cephALEXin (KEFLEX) 500 MG capsule Take 1 capsule (500 mg total) by mouth 4 (four) times daily. 12/21/16 12/28/16  Darel Hong, MD  Cholecalciferol (VITAMIN D-3) 5000 UNITS TABS Take by mouth daily.     [provider]  empagliflozin (JARDIANCE) 25 MG TABS tablet Take 25 mg by mouth daily. 10/11/16   Guadalupe Maple, MD  finasteride (PROSCAR) 5 MG tablet Take 1 tablet (5 mg total) by mouth daily. 12/15/16   [name], MD  hydrochlorothiazide (HYDRODIURIL) 25 MG tablet Take 1 tablet (25 mg total) by mouth daily. 10/11/16   Guadalupe Maple, MD  metFORMIN (GLUCOPHAGE) 500 MG tablet Take 2 tablets (1,000 mg total) by mouth 2 (two) times daily with a meal. 10/11/16   Crissman, Jeannette How, MD  Multiple Vitamins-Minerals (MULTIVITAMIN WITH MINERALS) tablet Take 1 tablet by mouth daily.    [provider]  phenazopyridine (PYRIDIUM) 200 MG tablet Take 1 tablet (200 mg total) by  mouth 3 (three) times daily as needed for pain. 12/21/16 12/21/17  Darel Hong, MD  tamsulosin (FLOMAX) 0.4 MG CAPS capsule Take 1 capsule (0.4 mg total) by mouth daily. 11/25/16   Darel Hong, MD  tamsulosin (FLOMAX) 0.4 MG CAPS capsule Take 2 capsules (0.8 mg total) by mouth daily. 12/15/16   [name], MD    Allergies Acetaminophen and Amlodipine  Family History  Problem Relation Age of Onset  . Colon cancer Father   . Angina Father   . Macular degeneration Father   . Alzheimer's disease Mother   . Prostate cancer Neg Hx   .  Bladder Cancer Neg Hx   . Kidney cancer Neg Hx     Social History Social History  Substance Use Topics  . Smoking status: Former Smoker    Packs/day: 1.00    Years: 13.00    Types: Cigarettes    Quit date: 02/21/1984  . Smokeless tobacco: Never Used  . Alcohol use 0.0 oz/week     Comment: occ    Review of Systems Constitutional: No fever/chills Eyes: No visual changes. ENT: No sore throat. Cardiovascular: Denies chest pain. Respiratory: Denies shortness of breath. Gastrointestinal: No abdominal pain.  No nausea, no vomiting.  No diarrhea.  No constipation. Genitourinary: Positive for dysuria. Musculoskeletal: Negative for back pain. Skin: Negative for rash. Neurological: Negative for headaches, focal weakness or numbness.   ____________________________________________   PHYSICAL EXAM:  VITAL SIGNS: ED Triage Vitals  Enc Vitals Group     BP 12/21/16 2110 113/79     Pulse Rate 12/21/16 2110 (!) 105     Resp 12/21/16 2110 18     Temp 12/21/16 2110 98.6 F (37 C)     Temp Source 12/21/16 2110 Oral     SpO2 12/21/16 2110 96 %     Weight 12/21/16 2113 183 lb (83 kg)     Height 12/21/16 2113 6\' 1"  (1.854 m)     Head Circumference --      Peak Flow --      Pain Score 12/21/16 2111 2     Pain Loc --      Pain Edu? --      Excl. in [place]? --     Constitutional: Alert and oriented x4 well-appearing nontoxic no diaphoresis speaks in full clear sentences Eyes: PERRL EOMI. Head: Atraumatic. Nose: No congestion/rhinnorhea. Mouth/Throat: No trismus Neck: No stridor.   Cardiovascular: Normal rate, regular rhythm. Grossly normal heart sounds.  Good peripheral circulation. Respiratory: Normal respiratory effort.  No retractions. Lungs CTAB and moving good air Gastrointestinal: Soft nontender no rebound or guarding or peritonitis no costovertebral tenderness Musculoskeletal: No lower extremity edema   Neurologic:  Normal speech and language. No gross focal neurologic deficits  are appreciated. Skin:  Skin is warm, dry and intact. No rash noted. Psychiatric: Mood and affect are normal. Speech and behavior are normal.    ____________________________________________   DIFFERENTIAL includes but not limited to  Urinary retention, urinary tract infection, pyelonephritis ____________________________________________   LABS (all labs ordered are listed, but only abnormal results are displayed)  Labs Reviewed  URINALYSIS, COMPLETE (UACMP) WITH MICROSCOPIC - Abnormal; Notable for the following:       Result Value   Color, Urine YELLOW (*)    APPearance CLEAR (*)    Specific Gravity, Urine 1.038 (*)    Glucose, UA >=500 (*)    Hgb urine dipstick SMALL (*)    Ketones, ur 20 (*)    Squamous  Epithelial / LPF 0-5 (*)    All other components within normal limits  URINE CULTURE    Urinalysis reviewed by me is equivocal for infection it does show whites but no nitrates. __________________________________________  EKG   ____________________________________________  RADIOLOGY   ____________________________________________   PROCEDURES  Procedure(s) performed: no  Procedures  Critical Care performed: no  Observation: no ____________________________________________   INITIAL IMPRESSION / ASSESSMENT AND PLAN / ED COURSE  Pertinent labs & imaging results that were available during my care of the patient were reviewed by me and considered in my medical decision making (see chart for details).  The patient arrives very well-appearing with a benign abdominal exam.  He is able to urinate although he has a postvoid residual by ultrasound of roughly 170 cc.  His urinalysis has some white cells although no bacteria and no nitrites.  He does not require a Foley catheter at this point, however as he was sent by his urologist office will reach out to on-call urology now to discuss.  I anticipate discharge home with clinic follow-up tomorrow.     Time  spent----------------------------------------- 11:52 PM on 12/21/2016 -----------------------------------------  I discussed the case with on-call urologist Dr. Louis Meckel who agrees that as the patient is not acutely obstructed he does not require inpatient admission nor another Foley catheter.  The patient's urinalysis is not convincing for infection, however he did have a temperature of 99.7 degrees today along with dysuria and frequency and as he had a recent Foley catheter he is a set up for an infection.  Culture has been sent but he recommends 1 week of antibiotics which I think is reasonable.  He has no previous positive cultures.  Keflex 4 times a day for 1 week and he understands to call the office tomorrow for recheck. ____________________________________________   FINAL CLINICAL IMPRESSION(S) / ED DIAGNOSES  Final diagnoses:  Dysuria      NEW MEDICATIONS STARTED DURING THIS VISIT:  Discharge Medication List as of 12/21/2016 11:59 PM    START taking these medications   Details  cephALEXin (KEFLEX) 500 MG capsule Take 1 capsule (500 mg total) by mouth 4 (four) times daily., Starting Thu 12/21/2016, Until Thu 12/28/2016, Print    phenazopyridine (PYRIDIUM) 200 MG tablet Take 1 tablet (200 mg total) by mouth 3 (three) times daily as needed for pain., Starting Thu 12/21/2016, Until Fri 12/21/2017, Print         Note:  This document was prepared using Dragon voice recognition software and may include unintentional dictation errors.     Darel Hong, MD 12/22/16 [phone]

## 2016-12-21 NOTE — ED Triage Notes (Signed)
Bladder scan 173 ml

## 2016-12-21 NOTE — ED Notes (Signed)
ED Provider at bedside. 

## 2016-12-21 NOTE — Discharge Instructions (Signed)
Please take all of your antibiotics as prescribed and call your urology office tomorrow for a recheck.  Return to the emergency department sooner for any new or worsening symptoms such as fevers, chills, if you are unable to urinate, or for any other issues whatsoever.  It was a pleasure to take care of you today, and thank you for coming to our emergency department.  If you have any questions or concerns before leaving please ask the nurse to grab me and I'm more than happy to go through your aftercare instructions again.  If you were prescribed any opioid pain medication today such as Norco, Vicodin, Percocet, morphine, hydrocodone, or oxycodone please make sure you do not drive when you are taking this medication as it can alter your ability to drive safely.  If you have any concerns once you are home that you are not improving or are in fact getting worse before you can make it to your follow-up appointment, please do not hesitate to call 911 and come back for further evaluation.  Darel Hong, MD  Results for orders placed or performed during the hospital encounter of 12/21/16  Urinalysis, Complete w Microscopic  Result Value Ref Range   Color, Urine YELLOW (A) YELLOW   APPearance CLEAR (A) CLEAR   Specific Gravity, Urine 1.038 (H) 1.005 - 1.030   pH 5.0 5.0 - 8.0   Glucose, UA >=500 (A) NEGATIVE mg/dL   Hgb urine dipstick SMALL (A) NEGATIVE   Bilirubin Urine NEGATIVE NEGATIVE   Ketones, ur 20 (A) NEGATIVE mg/dL   Protein, ur NEGATIVE NEGATIVE mg/dL   Nitrite NEGATIVE NEGATIVE   Leukocytes, UA NEGATIVE NEGATIVE   RBC / HPF 0-5 0 - 5 RBC/hpf   WBC, UA 6-30 0 - 5 WBC/hpf   Bacteria, UA NONE SEEN NONE SEEN   Squamous Epithelial / LPF 0-5 (A) NONE SEEN

## 2016-12-21 NOTE — ED Triage Notes (Signed)
Patient states that he has increased urinary frequency with small amounts that started this afternoon. Patient states twice in the past 3 weeks for urinary retention. Patient states that last time he was seen here for urinary retention a urologist had to place the foley catheter with a scope.

## 2016-12-23 ENCOUNTER — Emergency Department
Admission: EM | Admit: 2016-12-23 | Discharge: 2016-12-23 | Disposition: A | Discharge status: Home / Self Care | Payer: Medicare Other | Attending: Student in an Organized Health Care Education/Training Program | Admitting: Student in an Organized Health Care Education/Training Program

## 2016-12-23 ENCOUNTER — Encounter: Payer: Self-pay | Admitting: Emergency Medicine

## 2016-12-23 DIAGNOSIS — R339 Retention of urine, unspecified: Secondary | ICD-10-CM

## 2016-12-23 DIAGNOSIS — Z87891 Personal history of nicotine dependence: Secondary | ICD-10-CM | POA: Insufficient documentation

## 2016-12-23 DIAGNOSIS — I10 Essential (primary) hypertension: Secondary | ICD-10-CM | POA: Diagnosis not present

## 2016-12-23 DIAGNOSIS — E119 Type 2 diabetes mellitus without complications: Secondary | ICD-10-CM | POA: Diagnosis not present

## 2016-12-23 DIAGNOSIS — Z79899 Other long term (current) drug therapy: Secondary | ICD-10-CM | POA: Diagnosis not present

## 2016-12-23 LAB — URINALYSIS, COMPLETE (UACMP) WITH MICROSCOPIC
BILIRUBIN URINE: NEGATIVE
Bacteria, UA: NONE SEEN
KETONES UR: 20 mg/dL — AB
LEUKOCYTES UA: NEGATIVE
NITRITE: POSITIVE — AB
PH: 5 (ref 5.0–8.0)
Protein, ur: NEGATIVE mg/dL
SPECIFIC GRAVITY, URINE: 1.036 — AB (ref 1.005–1.030)
SQUAMOUS EPITHELIAL / LPF: NONE SEEN

## 2016-12-23 LAB — URINE CULTURE: CULTURE: NO GROWTH

## 2016-12-23 MED ORDER — LIDOCAINE HCL 2 % EX GEL: 1.0000 "application " | Freq: Once | CUTANEOUS | Status: DC

## 2016-12-23 MED ORDER — LIDOCAINE HCL 2 % EX GEL
CUTANEOUS | Status: DC
Start: 2016-12-23 — End: 2016-12-23
  Filled 2016-12-23: qty 20

## 2016-12-23 NOTE — ED Triage Notes (Signed)
Pt was seen for urinary retention on Thursday night. Before discharge on Thursday otw as able to void and was sent home on antibiotics. Pt was unable to fully empty bladder yesterday and has increased discomfort.

## 2016-12-23 NOTE — ED Notes (Signed)
Bladder scan at 928ml

## 2016-12-23 NOTE — ED Provider Notes (Signed)
[facility] Emergency Department Provider Note    First MD Initiated Contact with Patient 12/23/16 0740     (approximate)  I have reviewed the triage vital signs and the nursing notes.   HISTORY  Chief Complaint Urinary Retention  33  HPI Tyler Morgan is a 71 y.o. male presents with chief complaint of urinary retention it is acute with moderate to severe suprapubic pain and pressure.  States he is not been able to empty his bladder for the past 12-16 hours.  Patient with recent ER visits for similar symptoms requiring initially a cystoscopic Foley catheter placement due to phimosis as well as a false passage posteriorly.  Foley catheter was removed last week and she passed a voiding trial but over the past 2-3 days has had worsening urinary hesitancy and retention becoming acutely severe to the point where he had to come to the ER this morning.  Past Medical History:  Diagnosis Date  . Diabetes mellitus without complication ([place])   . ED (erectile dysfunction)   . Hypertension    Family History  Problem Relation Age of Onset  . Colon cancer Father   . Angina Father   . Macular degeneration Father   . Alzheimer's disease Mother   . Prostate cancer Neg Hx   . Bladder Cancer Neg Hx   . Kidney cancer Neg Hx    Past Surgical History:  Procedure Laterality Date  . CATARACT EXTRACTION Bilateral 1986, 2014  . EYE SURGERY Bilateral [id number]  . FRACTURE SURGERY Right    as a child/ right arm  . HERNIA REPAIR Right 01-13-14   inguinal hernia  . RETINAL DETACHMENT SURGERY  Jan 2015  . TONSILLECTOMY  1995   Patient Active Problem List   Diagnosis Date Noted  . Balanitis 10/11/2016  . Advanced care planning/counseling discussion 10/11/2016  . DM type 2 (diabetes mellitus, type 2) ([place]) 12/15/2014  . Calf muscle weakness 12/15/2014  . Hypertension       Prior to Admission medications   Medication Sig Start Date End Date Taking? Authorizing  Provider  Alpha-Lipoic Acid 600 MG CAPS Take by mouth.   Yes [provider]  atenolol (TENORMIN) 100 MG tablet Take 1 tablet (100 mg total) by mouth daily. 10/11/16  Yes Crissman, Jeannette How, MD  B Complex Vitamins (VITAMIN B COMPLEX PO) Take 50 mg by mouth.   Yes [provider]  benazepril (LOTENSIN) 40 MG tablet Take 1 tablet (40 mg total) by mouth daily. 10/11/16  Yes Crissman, Jeannette How, MD  calcium carbonate (OS-CAL) 600 MG TABS tablet Take 600 mg by mouth 2 (two) times daily with a meal.   Yes [provider]  cephALEXin (KEFLEX) 500 MG capsule Take 1 capsule (500 mg total) by mouth 4 (four) times daily. 12/21/16 12/28/16 Yes Darel Hong, MD  Cholecalciferol (VITAMIN D-3) 5000 UNITS TABS Take by mouth daily.    Yes [provider]  empagliflozin (JARDIANCE) 25 MG TABS tablet Take 25 mg by mouth daily. 10/11/16  Yes Crissman, Jeannette How, MD  finasteride (PROSCAR) 5 MG tablet Take 1 tablet (5 mg total) by mouth daily. 12/15/16  Yes [name], MD  hydrochlorothiazide (HYDRODIURIL) 25 MG tablet Take 1 tablet (25 mg total) by mouth daily. 10/11/16  Yes Guadalupe Maple, MD  metFORMIN (GLUCOPHAGE) 500 MG tablet Take 2 tablets (1,000 mg total) by mouth 2 (two) times daily with a meal. 10/11/16  Yes Crissman, Jeannette How, MD  Multiple  Vitamins-Minerals (MULTIVITAMIN WITH MINERALS) tablet Take 1 tablet by mouth daily.   Yes [provider]  phenazopyridine (PYRIDIUM) 200 MG tablet Take 1 tablet (200 mg total) by mouth 3 (three) times daily as needed for pain. 12/21/16 12/21/17 Yes Darel Hong, MD  tamsulosin (FLOMAX) 0.4 MG CAPS capsule Take 2 capsules (0.8 mg total) by mouth daily. 12/15/16  Yes [name], MD  tamsulosin (FLOMAX) 0.4 MG CAPS capsule Take 1 capsule (0.4 mg total) by mouth daily. Patient not taking: Reported on 12/23/2016 11/25/16   Darel Hong, MD    Allergies Acetaminophen and Amlodipine    Social History Social History    Substance Use Topics  . Smoking status: Former Smoker    Packs/day: 1.00    Years: 13.00    Types: Cigarettes    Quit date: 02/21/1984  . Smokeless tobacco: Never Used  . Alcohol use 0.0 oz/week     Comment: occ    Review of Systems Patient denies headaches, rhinorrhea, blurry vision, numbness, shortness of breath, chest pain, edema, cough, abdominal pain, nausea, vomiting, diarrhea, dysuria, fevers, rashes or hallucinations unless otherwise stated above in HPI. ____________________________________________   PHYSICAL EXAM:  VITAL SIGNS: Vitals:   12/23/16 0632  BP: (!) 171/83  Pulse: 95  Resp: 19  Temp: 97.6 F (36.4 C)  SpO2: 97%    Constitutional: Alert and oriented. Well appearing and in no acute distress. Eyes: Conjunctivae are normal.  Head: Atraumatic. Nose: No congestion/rhinnorhea. Mouth/Throat: Mucous membranes are moist.   Neck: No stridor. Painless ROM.  Cardiovascular: Normal rate, regular rhythm. Grossly normal heart sounds.  Good peripheral circulation. Respiratory: Normal respiratory effort.  No retractions. Lungs CTAB. Gastrointestinal: Soft and nontender. No distention. No abdominal bruits. No CVA tenderness. Genitourinary: There is phimosis with some purulent appearing discharge.  Suprapubic tenderness to palpation.  No testicular swelling or masses. Musculoskeletal: No lower extremity tenderness nor edema.  No joint effusions. Neurologic:  Normal speech and language. No gross focal neurologic deficits are appreciated. No facial droop Skin:  Skin is warm, dry and intact. No rash noted. Psychiatric: Mood and affect are normal. Speech and behavior are normal.  ____________________________________________   LABS (all labs ordered are listed, but only abnormal results are displayed)  No results found for this or any previous visit (from the past 24  hour(s)). ____________________________________________ ____________________________________________   PROCEDURES  Procedure(s) performed:  Procedures  Successful placement of urethral catheter using sterile precautions.  33 French coud catheter was placed with successful decompression of the bladder.   Critical Care performed: no ____________________________________________   INITIAL IMPRESSION / ASSESSMENT AND PLAN / ED COURSE  Pertinent labs & imaging results that were available during my care of the patient were reviewed by me and considered in my medical decision making (see chart for details).  DDX: urinary retention, uti, phimosis,   Tyler Morgan is a 71 y.o. who presents to the ED with acute urinary retention most likely secondary to prostatic enlargement.  Patient is AFVSS in ED. Exam as above. Given current presentation have considered the above differential.  Catheter was placed as described above with successful decompression of the bladder.  Urine is nitrite positive but the patient is currently on Pyridium.  He is being treated for urinary tract infection with Keflex.  Does not appear to be acutely infected.  At this point I do believe the patient is stable and appropriate for follow-up with urology.      ____________________________________________   FINAL CLINICAL IMPRESSION(S) /  ED DIAGNOSES  Final diagnoses:  Urinary retention      NEW MEDICATIONS STARTED DURING THIS VISIT:  New Prescriptions   No medications on file     Note:  This document was prepared using Dragon voice recognition software and may include unintentional dictation errors.    Merlyn Lot, MD 12/23/16 [phone]

## 2016-12-23 NOTE — ED Notes (Signed)
Pt reports last void at approximately at midnight. Pt reports voiding only a small amount.

## 2016-12-23 NOTE — ED Notes (Signed)
Leg bag applied to foley. Pt verbalized understanding and returned demonstration of how to use.

## 2016-12-25 ENCOUNTER — Ambulatory Visit: Payer: Medicare Other

## 2016-12-25 DIAGNOSIS — R339 Retention of urine, unspecified: Secondary | ICD-10-CM

## 2016-12-25 NOTE — Progress Notes (Signed)
Pt presented today with several questions in reference to foley being placed over the weekend at the ER. All questions were answered. Pt then stated that he is ready to have surgery. Pt requested to see Dr. Erlene Quan. An appt was made with Dr. Erlene Quan for pt to discuss surgery options.

## 2016-12-26 ENCOUNTER — Ambulatory Visit
Admission: RE | Admit: 2016-12-26 | Discharge: 2016-12-26 | Disposition: A | Discharge status: Home / Self Care | Payer: Medicare Other | Source: Ambulatory Visit | Attending: Neurology | Admitting: Neurology

## 2016-12-26 DIAGNOSIS — M6281 Muscle weakness (generalized): Secondary | ICD-10-CM | POA: Insufficient documentation

## 2016-12-26 DIAGNOSIS — G609 Hereditary and idiopathic neuropathy, unspecified: Secondary | ICD-10-CM | POA: Insufficient documentation

## 2016-12-26 DIAGNOSIS — M5126 Other intervertebral disc displacement, lumbar region: Secondary | ICD-10-CM | POA: Insufficient documentation

## 2016-12-26 DIAGNOSIS — M48061 Spinal stenosis, lumbar region without neurogenic claudication: Secondary | ICD-10-CM | POA: Diagnosis not present

## 2016-12-26 DIAGNOSIS — M5137 Other intervertebral disc degeneration, lumbosacral region: Secondary | ICD-10-CM | POA: Diagnosis not present

## 2017-01-02 ENCOUNTER — Ambulatory Visit: Payer: Medicare Other | Admitting: Urology

## 2017-01-02 ENCOUNTER — Encounter: Payer: Self-pay | Admitting: Urology

## 2017-01-02 VITALS — BP 117/65 | HR 78 | Ht 73.0 in | Wt 180.0 lb

## 2017-01-02 DIAGNOSIS — N471 Phimosis: Secondary | ICD-10-CM | POA: Diagnosis not present

## 2017-01-02 DIAGNOSIS — R338 Other retention of urine: Secondary | ICD-10-CM

## 2017-01-02 DIAGNOSIS — N401 Enlarged prostate with lower urinary tract symptoms: Secondary | ICD-10-CM | POA: Diagnosis not present

## 2017-01-02 MED ORDER — CIPROFLOXACIN HCL 500 MG PO TABS: 500.0000 mg | ORAL_TABLET | Freq: Once | ORAL | Status: DC

## 2017-01-02 MED ORDER — LIDOCAINE HCL 2 % EX GEL: 1.0000 "application " | Freq: Once | CUTANEOUS | Status: DC

## 2017-01-02 NOTE — Progress Notes (Signed)
01/02/2017 1:50 PM   Tyler Morgan 1946-01-24 [id number]  Referring provider: Guadalupe Maple, MD [address] [place], [place] [address]  Chief Complaint  Patient presents with  . Benign Prostatic Hypertrophy    TRUS &Cysto    HPI: 71 year old male with urinary retention presumably secondary to BPH who presents today for further evaluation.  He has failed multiple previous voiding trials.  He has been seen and evaluated previously by Dr. Pilar Jarvis.  He is currently on Flomax 0.8 mg as well as recently added finasteride to his regimen.  He has a history of difficult Foley catheter placement requiring cystoscopic placement in the ER.  This was secondary to a false pass.  He currently has an indwelling Foley catheter.  He does report that prior to going into retention, he noted a weaker urinary stream but otherwise had no significant issues urinating.  His wife thinks that perhaps his obstructive symptoms have been going on for longer than he appreciates.  He does have a personal history of elevated PSA.  He is undergone biopsy in the past which was negative (~10 years ago with Dr. Bernardo Heater, records not available).  Most recent PSA 8.5 on 10/11/16 down from 12.3 one year ago.    He has been told in the past that his prostate was markedly enlarged.  He has no previous cross-sectional imaging for review.  He also has a history of significant phimosis.  He is used creams with some success but is unable to completely retract his foreskin.  He presents today to discuss his treatment options.     PMH: Past Medical History:  Diagnosis Date  . Diabetes mellitus without complication ([place])   . ED (erectile dysfunction)   . Hypertension     Surgical History: Past Surgical History:  Procedure Laterality Date  . CATARACT EXTRACTION Bilateral 1986, 2014  . EYE SURGERY Bilateral [id number]  . FRACTURE SURGERY Right    as a child/ right arm  . HERNIA REPAIR Right 01-13-14   inguinal  hernia  . RETINAL DETACHMENT SURGERY  Jan 2015  . TONSILLECTOMY  1995    Home Medications:  Allergies as of 01/02/2017      Reactions   Acetaminophen Swelling   Swelling of liver  swelling   Amlodipine Swelling      Medication List        Accurate as of 01/02/17 11:59 PM. Always use your most recent med list.          Alpha-Lipoic Acid 600 MG Caps Take by mouth.   atenolol 100 MG tablet Commonly known as:  TENORMIN Take 1 tablet (100 mg total) by mouth daily.   benazepril 40 MG tablet Commonly known as:  LOTENSIN Take 1 tablet (40 mg total) by mouth daily.   calcium carbonate 600 MG Tabs tablet Commonly known as:  OS-CAL Take 600 mg by mouth 2 (two) times daily with a meal.   empagliflozin 25 MG Tabs tablet Commonly known as:  JARDIANCE Take 25 mg by mouth daily.   finasteride 5 MG tablet Commonly known as:  PROSCAR Take 1 tablet (5 mg total) by mouth daily.   hydrochlorothiazide 25 MG tablet Commonly known as:  HYDRODIURIL Take 1 tablet (25 mg total) by mouth daily.   metFORMIN 500 MG tablet Commonly known as:  GLUCOPHAGE Take 2 tablets (1,000 mg total) by mouth 2 (two) times daily with a meal.   multivitamin with minerals tablet Take 1 tablet by mouth daily.  phenazopyridine 200 MG tablet Commonly known as:  PYRIDIUM Take 1 tablet (200 mg total) by mouth 3 (three) times daily as needed for pain.   tamsulosin 0.4 MG Caps capsule Commonly known as:  FLOMAX Take 2 capsules (0.8 mg total) by mouth daily.   VITAMIN B COMPLEX PO Take 50 mg by mouth.   Vitamin D-3 5000 units Tabs Take by mouth daily.       Allergies:  Allergies  Allergen Reactions  . Acetaminophen Swelling    Swelling of liver  swelling   . Amlodipine Swelling    Family History: Family History  Problem Relation Age of Onset  . Colon cancer Father   . Angina Father   . Macular degeneration Father   . Alzheimer's disease Mother   . Prostate cancer Neg Hx   . Bladder  Cancer Neg Hx   . Kidney cancer Neg Hx     Social History:  reports that he quit smoking about 32 years ago. His smoking use included cigarettes. He has a 13.00 pack-year smoking history. he has never used smokeless tobacco. He reports that he drinks alcohol. He reports that he does not use drugs.  ROS: UROLOGY Frequent Urination?: No Hard to postpone urination?: No Burning/pain with urination?: No Get up at night to urinate?: No Leakage of urine?: No Urine stream starts and stops?: No Trouble starting stream?: No Do you have to strain to urinate?: No Blood in urine?: No Urinary tract infection?: No Sexually transmitted disease?: No Injury to kidneys or bladder?: No Painful intercourse?: No Weak stream?: No Erection problems?: No Penile pain?: No  Gastrointestinal Nausea?: No Vomiting?: No Indigestion/heartburn?: No Diarrhea?: No Constipation?: No  Constitutional Fever: No Night sweats?: No Weight loss?: No Fatigue?: No  Skin Skin rash/lesions?: No Itching?: No  Eyes Blurred vision?: No Double vision?: No  Ears/Nose/Throat Sore throat?: No Sinus problems?: No  Hematologic/Lymphatic Swollen glands?: No Easy bruising?: No  Cardiovascular Leg swelling?: No Chest pain?: No  Respiratory Cough?: No Shortness of breath?: No  Endocrine Excessive thirst?: No  Musculoskeletal Back pain?: No Joint pain?: No  Neurological Headaches?: No Dizziness?: No  Psychologic Depression?: No Anxiety?: No  Physical Exam: BP 117/65   Pulse 78   Ht 6\' 1"  (1.854 m)   Wt 180 lb (81.6 kg)   BMI 23.75 kg/m   Constitutional:  Alert and oriented, No acute distress.  Accompanied today by his wife. HEENT: [place] AT, moist mucus membranes.  Trachea midline, no masses. Cardiovascular: No clubbing, cyanosis, or edema. Respiratory: Normal respiratory effort, no increased work of breathing. GI: Abdomen is soft, nontender, nondistended, no abdominal masses GU: No CVA  tenderness.  Uncircumcised phallus with partially retractable foreskin.  Bilateral descended testicles, unremarkable.  No masses. Neurologic: Grossly intact, no focal deficits, moving all 4 extremities. Psychiatric: Normal mood and affect.  Laboratory Data: Lab Results  Component Value Date   WBC 19.1 (H) 11/25/2016   HGB 15.3 11/25/2016   HCT 45.1 11/25/2016   MCV 87.1 11/25/2016   PLT 247 11/25/2016    Lab Results  Component Value Date   CREATININE 1.26 (H) 11/25/2016    Lab Results  Component Value Date   PSA1 8.5 (H) 10/11/2016   PSA1 12.3 (H) 07/21/2015    Lab Results  Component Value Date   HGBA1C 8.2 07/21/2015    Urinalysis Lab Results  Component Value Date   SPECGRAV 1.020 10/11/2016   PHUR 5.5 10/11/2016   COLORU Yellow 10/11/2016   APPEARANCEUR CLEAR (  A) 12/23/2016   LEUKOCYTESUR NEGATIVE 12/23/2016   PROTEINUR NEGATIVE 12/23/2016   GLUCOSEU >=500 (A) 12/23/2016   KETONESU Trace (A) 10/11/2016   RBCU 0-5 12/23/2016   BILIRUBINUR NEGATIVE 12/23/2016   UUROB 0.2 10/11/2016   NITRITE POSITIVE (A) 12/23/2016    Lab Results  Component Value Date   LABMICR See below: 10/11/2016   WBCUA 0-5 10/11/2016   RBCUA 0-2 10/11/2016   LABEPIT 0-10 10/11/2016   BACTERIA NONE SEEN 12/23/2016    Pertinent Imaging:  Transected ultrasound was performed today.  The patient was positioned in the lateral decubitus position with the right side.  Knees replaced the chest.  At this point in time, lidocaine jelly was applied per rectum.  Digital rectal exam was unremarkable other than for massive gland, only able to palpate the apex of the prostate due to size.  At this point in time, the endorectal probe was placed.  At this point time, markedly enlarged prostate was appreciated.  There is intravesical protrusion without a discrete median lobe.  There were multiple diffuse calcifications but no hypoechoic lesions.  TRUS volume today 182 cc.  The probe was removed and  the procedure was well-tolerated.   Cystoscopy Procedure Note  Patient identification was confirmed, informed consent was obtained, and patient was prepped using Betadine solution.  Lidocaine jelly was administered per urethral meatus.    Preoperative abx where received prior to procedure.     Pre-Procedure: - Inspection reveals a normal caliber ureteral meatus.  Procedure: The flexible cystoscope was introduced without difficulty - No urethral strictures/lesions are present. - Enlarged prostate with significant trilobar coaptation, irregular prostatic fossa, 6 cm prostatic length - Elevated bladder neck - Bilateral ureteral orifices identified - Bladder mucosa  reveals no ulcers, tumors, or lesions - No bladder stones - Moderate trabeculation  Retroflexion shows intravesical protrusion of the median lobe component of the prostate  Post-Procedure: - Patient tolerated the procedure well  Assessment & Plan:    1. Benign prostatic hyperplasia with urinary retention Discussed options including surgical management versus urodynamics Given that he is failed multiple voiding trials, urodynamics may be slightly less beneficial if he is unable to provide a pressure flow study to assess for obstruction At this point in time, he would be interested in surgical intervention for his ongoing retention For the purposes of surgical planning, cystoscopy and transrectal ultrasound prostate sizing was performed today He will return at a later date to review his options which will include open simple prostatectomy, robotic simple prostatectomy, and holmium laser enucleation of the prostate given his gland size Foley catheter was replaced today following cystoscopy - ciprofloxacin (CIPRO) tablet 500 mg - lidocaine (XYLOCAINE) 2 % jelly 1 application  2. Phimosis May consider circumcision at the time of surgery, will discuss further next visit  3. Elevated PSA PSA somewhat elevated, however,  appropriate for size of gland Downward trend is reassuring No indication for biopsy at this time, will continue to follow  Return to care next available appointment to discuss surgical intervention  Hollice Espy, MD  [facility] [address] [place], [place] [address] [phone]

## 2017-01-04 ENCOUNTER — Ambulatory Visit: Payer: Medicare Other

## 2017-01-16 ENCOUNTER — Ambulatory Visit: Payer: Medicare Other | Admitting: Urology

## 2017-01-16 ENCOUNTER — Encounter: Payer: Self-pay | Admitting: Urology

## 2017-01-16 VITALS — BP 103/57 | HR 73 | Ht 73.0 in | Wt 184.0 lb

## 2017-01-16 DIAGNOSIS — N401 Enlarged prostate with lower urinary tract symptoms: Secondary | ICD-10-CM

## 2017-01-16 DIAGNOSIS — R338 Other retention of urine: Secondary | ICD-10-CM | POA: Diagnosis not present

## 2017-01-16 DIAGNOSIS — R339 Retention of urine, unspecified: Secondary | ICD-10-CM | POA: Diagnosis not present

## 2017-01-16 DIAGNOSIS — N471 Phimosis: Secondary | ICD-10-CM | POA: Diagnosis not present

## 2017-01-17 ENCOUNTER — Other Ambulatory Visit: Payer: Self-pay | Admitting: Radiology

## 2017-01-17 DIAGNOSIS — N401 Enlarged prostate with lower urinary tract symptoms: Secondary | ICD-10-CM

## 2017-01-17 DIAGNOSIS — R338 Other retention of urine: Principal | ICD-10-CM

## 2017-01-18 ENCOUNTER — Ambulatory Visit: Payer: Self-pay | Admitting: *Deleted

## 2017-01-18 ENCOUNTER — Other Ambulatory Visit: Payer: Self-pay | Admitting: Radiology

## 2017-01-18 DIAGNOSIS — R338 Other retention of urine: Principal | ICD-10-CM

## 2017-01-18 DIAGNOSIS — N401 Enlarged prostate with lower urinary tract symptoms: Secondary | ICD-10-CM

## 2017-01-18 NOTE — Telephone Encounter (Signed)
Pt  Developed    Oct  5    Was  catherized    At that  Time  And  Has    A  Foley  At  This  Time   Has  Had  Fluctuations    Has  Surgery Scheduled  -  Prostate  Surgery    Dec  19    .  Pt  Reports  Weight  Loss   Since  Put  On   jardiance   15  Pounds  Since  Pt  Is  Concerned  About     Blood pressure  Which 2  Days  Ago was 110/56  Pt  Reports   Reports  At  [place]  When he  Stands  Up

## 2017-01-18 NOTE — Telephone Encounter (Signed)
  Reason for Disposition . Brief (now gone) dizziness or lightheadedness after standing up or eating  Answer Assessment - Initial Assessment Questions 1. SYMPTOMS: "Do you have any symptoms?"      Pt  Gets lightheaded  At  Times    Especially   When    He   Stands   Up  Quickly   2. SEVERITY: If symptoms are present, ask "Are they mild, moderate or severe?"     Moderate    At  Times     Sitting     Down  Helps  Answer Assessment - Initial Assessment Questions 1. BLOOD PRESSURE: "What is the blood pressure?" "Did you take at least two measurements 5 minutes apart?"     No 2. ONSET: "When did you take your blood pressure?"     2 days  ago 3. HOW: "How did you obtain the blood pressure?" (e.g., visiting nurse, automatic home BP monitor)       Doctors  Office  4. HISTORY: "Do you have a history of low blood pressure?" "What is your blood pressure normally?"     Normally high  But  Has  Been  Lower  recently 5. MEDICATIONS: "Are you taking any medications for blood pressure?" If yes: "Have they been changed recently?"      New  Medications added  flomax    6. PULSE RATE: "Do you know what your pulse rate is?"        Low  70   7. OTHER SYMPTOMS: "Have you been sick recently?" "Have you had a recent injury?"      no 8. PREGNANCY: "Is there any chance you are pregnant?" "When was your last menstrual period?"     no  Protocols used: LOW BLOOD PRESSURE-A-AH, MEDICATION QUESTION CALL-A-AH

## 2017-01-19 ENCOUNTER — Ambulatory Visit (INDEPENDENT_AMBULATORY_CARE_PROVIDER_SITE_OTHER): Payer: Medicare Other | Admitting: Family Medicine

## 2017-01-19 ENCOUNTER — Encounter: Payer: Self-pay | Admitting: Family Medicine

## 2017-01-19 VITALS — BP 144/87 | HR 74 | Temp 97.6°F | Wt 187.2 lb

## 2017-01-19 DIAGNOSIS — E119 Type 2 diabetes mellitus without complications: Secondary | ICD-10-CM | POA: Diagnosis not present

## 2017-01-19 DIAGNOSIS — R339 Retention of urine, unspecified: Secondary | ICD-10-CM | POA: Diagnosis not present

## 2017-01-19 DIAGNOSIS — I10 Essential (primary) hypertension: Secondary | ICD-10-CM | POA: Diagnosis not present

## 2017-01-19 NOTE — Assessment & Plan Note (Signed)
Cut atenolol in half to 50 mg daily and monitor home BPs and HR carefully. Will recheck in 1 week for tolerance and symptom improvement.

## 2017-01-19 NOTE — Assessment & Plan Note (Signed)
Pt concerned about continuing his jardiance as the package insert states not to start the medication before his prostate procedure. Discussed with patient that he's fine to stay on, his renal function is tolerating it well and he's been stable on it with excellent A1C control since March.

## 2017-01-19 NOTE — Progress Notes (Signed)
BP (!) 144/87 (BP Location: Left Arm, Patient Position: Sitting, Cuff Size: Normal)   Pulse 74   Temp 97.6 F (36.4 C)   Wt 187 lb 4 oz (84.9 kg)   SpO2 96%   BMI 24.70 kg/m    Subjective:    Patient ID: Tyler Morgan, male    DOB: 1945/04/22, 71 y.o.   MRN: [id number]  HPI: Tyler Morgan is a 71 y.o. male  Chief Complaint  Patient presents with  . Hypotension    Patient states that his BP has been running low lately 100/50's earlier this week   . Procedure    Patient is scheduled to have surgery on 02/07/17 for an extremely enlarged prostate, patient would like to make sure it is ok to go ahead with surgery while taking Jardiance   Started having urinary retention in October, had to have multiple scopes and catheterizations since. Has been working with Urology since - has procedure scheduled next month for enlarged prostate which they think is causing most of the issues. States this is when his BP started changing from baseline. Before all of this, BPs were staying low normal (110s/70s). Has had several dizzy spells lately and concerned about hypotension (BPs have been measured in the 110/50 range at times). 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  Past Medical History:  Diagnosis Date  . Diabetes mellitus without complication ([place])   . ED (erectile dysfunction)   . Hypertension    Social History   Socioeconomic History  . Marital status: Married    Spouse name: Not on file  . Number of children: Not on file  . Years of education: Not on file  . Highest education level: Not on file  Social Needs  . Financial resource strain: Not on file  . Food insecurity - worry: Not on file  . Food insecurity - inability: Not on file  . Transportation needs - medical: Not on file  . Transportation needs - non-medical: Not on file  Occupational History  . Not on file  Tobacco Use  . Smoking status: Former Smoker   Packs/day: 1.00    Years: 13.00    Pack years: 13.00    Types: Cigarettes    Last attempt to quit: 02/21/1984    Years since quitting: 32.9  . Smokeless tobacco: Never Used  Substance and Sexual Activity  . Alcohol use: Yes    Alcohol/week: 0.0 oz    Comment: occ  . Drug use: No  . Sexual activity: Not on file  Other Topics Concern  . Not on file  Social History Narrative  . Not on file    Relevant past medical, surgical, family and social history reviewed and updated as indicated. Interim medical history since our last visit reviewed. Allergies and medications reviewed and updated.  Review of Systems  Constitutional: Negative.   HENT: Negative.   Eyes: Negative.   Respiratory: Negative.   Cardiovascular: Negative.   Gastrointestinal: Negative.   Genitourinary:       Urinary retention   Musculoskeletal: Negative.   Neurological: Positive for dizziness and light-headedness.  Psychiatric/Behavioral: Negative.    Per HPI unless specifically indicated above     Objective:    BP (!) 144/87 (BP Location: Left Arm, Patient Position: Sitting, Cuff Size: Normal)   Pulse 74   Temp 97.6 F (36.4 C)   Wt 187 lb 4 oz (84.9 kg)   SpO2  96%   BMI 24.70 kg/m   Wt Readings from Last 3 Encounters:  01/19/17 187 lb 4 oz (84.9 kg)  01/16/17 184 lb (83.5 kg)  01/02/17 180 lb (81.6 kg)    Physical Exam  Constitutional: He is oriented to person, place, and time. He appears well-developed and well-nourished. No distress.  HENT:  Head: Atraumatic.  Eyes: Conjunctivae are normal. Pupils are equal, round, and reactive to light.  Neck: Normal range of motion. Neck supple.  Cardiovascular: Normal rate and normal heart sounds.  Pulmonary/Chest: Effort normal and breath sounds normal. No respiratory distress.  Musculoskeletal: Normal range of motion.  Neurological: He is alert and oriented to person, place, and time. No cranial nerve deficit.  Skin: Skin is warm and dry.  Psychiatric:  He has a normal mood and affect. His behavior is normal.  Nursing note and vitals reviewed.     Assessment & Plan:   Problem List Items Addressed This Visit      Cardiovascular and Mediastinum   Hypertension - Primary    Cut atenolol in half to 50 mg daily and monitor home BPs and HR carefully. Will recheck in 1 week for tolerance and symptom improvement.         Endocrine   DM type 2 (diabetes mellitus, type 2) ([place])    Pt concerned about continuing his jardiance as the package insert states not to start the medication before his prostate procedure. Discussed with patient that he's fine to stay on, his renal function is tolerating it well and he's been stable on it with excellent A1C control since March.        Other Visit Diagnoses    Urinary retention       Followed by Urology for this, has upcoming procedure in about 2 weeks. Currently requiring catheterization       Follow up plan: Return in about 1 week (around 01/26/2017) for BP check.

## 2017-01-19 NOTE — Patient Instructions (Signed)
Follow up in 1 week

## 2017-01-24 ENCOUNTER — Other Ambulatory Visit: Payer: Self-pay | Admitting: Radiology

## 2017-01-24 ENCOUNTER — Ambulatory Visit (INDEPENDENT_AMBULATORY_CARE_PROVIDER_SITE_OTHER): Payer: Medicare Other

## 2017-01-24 VITALS — BP 120/80 | HR 74 | Ht 73.0 in | Wt 187.0 lb

## 2017-01-24 DIAGNOSIS — R339 Retention of urine, unspecified: Secondary | ICD-10-CM

## 2017-01-24 NOTE — Addendum Note (Signed)
Addended by: Toniann Fail C on: 01/24/2017 04:06 PM   Modules accepted: Orders

## 2017-01-24 NOTE — Progress Notes (Signed)
Cath Change/ Replacement  Patient is present today for a catheter change due to urinary retention.  86ml of water was removed from the balloon, a 16FR coude foley cath was removed with out difficulty.  Patient was cleaned and prepped in a sterile fashion with betadine and 2% lidocaine jelly was instilled into the urethra. A 16 FR coude foley cath was replaced into the bladder no complications were noted Urine return was noted 65ml and urine was yellow in color. The balloon was filled with 63ml of sterile water. A leg bag was attached for drainage.  A night bag was also given to the patient and patient was given instruction on how to change from one bag to another. Patient was given proper instruction on catheter care.    Preformed by: Toniann Fail, LPN   Follow up: A ucx was ordered for pt upcoming surgery.  Blood pressure 120/80, pulse 74, height 6\' 1"  (1.854 m), weight 187 lb (84.8 kg).

## 2017-01-25 NOTE — Progress Notes (Signed)
01/16/2017 2:17 PM   Tyler Morgan 1945-07-07 [id number]  Referring provider: Guadalupe Maple, MD [address] [place], [place] [address]  Chief Complaint  Patient presents with  . Benign Prostatic Hypertrophy    discuss surgery    HPI: 71 year old male with urinary retention presumably secondary to BPH status post multiple failed voiding trials.  He returns today to discuss possible outlet procedure.  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  Additionally, he underwent prostate sizing with a transitional ultrasound of 182 cc.  He is currently on Flomax 0.8 mg as well as recently added finasteride to his regimen.  He does report that prior to going into retention, he noted a weaker urinary stream but otherwise had no significant issues urinating.  His wife thinks that perhaps his obstructive symptoms have been going on for longer than he appreciates.  He does have a personal history of elevated PSA.  He is undergone biopsy in the past which was negative (~10 years ago with Dr. Bernardo Heater, records not available).  Most recent PSA 8.5 on 10/11/16 down from 12.3 one year ago.     He also has a history of significant phimosis.  He is used creams with some success but is unable to completely retract his foreskin.  He may be interested in circumcision at the time of outlet procedure.   PMH: Past Medical History:  Diagnosis Date  . Diabetes mellitus without complication ([place])   . ED (erectile dysfunction)   . Hypertension     Surgical History: Past Surgical History:  Procedure Laterality Date  . CATARACT EXTRACTION Bilateral 1986, 2014  . EYE SURGERY Bilateral [id number]  . FRACTURE SURGERY Right    as a child/ right arm  . HERNIA REPAIR Right 01-13-14   inguinal hernia  . RETINAL DETACHMENT SURGERY  Jan 2015  . TONSILLECTOMY  1995    Home Medications:  Allergies as of  01/16/2017      Reactions   Acetaminophen Swelling   Swelling of liver  swelling   Amlodipine Swelling      Medication List        Accurate as of 01/16/17 11:59 PM. Always use your most recent med list.          Alpha-Lipoic Acid 600 MG Caps Take by mouth.   atenolol 100 MG tablet Commonly known as:  TENORMIN Take 1 tablet (100 mg total) by mouth daily.   benazepril 40 MG tablet Commonly known as:  LOTENSIN Take 1 tablet (40 mg total) by mouth daily.   calcium carbonate 600 MG Tabs tablet Commonly known as:  OS-CAL Take 600 mg by mouth 2 (two) times daily with a meal.   empagliflozin 25 MG Tabs tablet Commonly known as:  JARDIANCE Take 25 mg by mouth daily.   finasteride 5 MG tablet Commonly known as:  PROSCAR Take 1 tablet (5 mg total) by mouth daily.   hydrochlorothiazide 25 MG tablet Commonly known as:  HYDRODIURIL Take 1 tablet (25 mg total) by mouth daily.   metFORMIN 500 MG tablet Commonly known as:  GLUCOPHAGE Take 2 tablets (1,000 mg total) by mouth 2 (two) times daily with a meal.   multivitamin with minerals tablet Take 1 tablet by mouth daily.   phenazopyridine 200 MG tablet Commonly known as:  PYRIDIUM Take 1 tablet (200 mg total) by mouth 3 (three) times daily as needed for pain.   tamsulosin 0.4  MG Caps capsule Commonly known as:  FLOMAX Take 2 capsules (0.8 mg total) by mouth daily.   VITAMIN B COMPLEX PO Take 50 mg by mouth.   Vitamin D-3 1000 units Caps Take 2,000 Units by mouth daily.       Allergies:  Allergies  Allergen Reactions  . Acetaminophen Swelling and Other (See Comments)    Swelling of liver   . Amlodipine Swelling    Family History: Family History  Problem Relation Age of Onset  . Colon cancer Father   . Angina Father   . Macular degeneration Father   . Alzheimer's disease Mother   . Prostate cancer Neg Hx   . Bladder Cancer Neg Hx   . Kidney cancer Neg Hx     Social History:  reports that he quit  smoking about 32 years ago. His smoking use included cigarettes. He has a 13.00 pack-year smoking history. he has never used smokeless tobacco. He reports that he drinks alcohol. He reports that he does not use drugs.  ROS: 12 point review of systems is negative other than as per HPI.  Physical Exam: BP (!) 103/57   Pulse 73   Ht 6\' 1"  (1.854 m)   Wt 184 lb (83.5 kg)   BMI 24.28 kg/m   Constitutional:  Alert and oriented, No acute distress.  Accompanied today by his wife. HEENT: [place] AT, moist mucus membranes.  Trachea midline, no masses. Cardiovascular: No clubbing, cyanosis, or edema. Respiratory: Normal respiratory effort, no increased work of breathing. GI: Abdomen is soft, nontender, nondistended, no abdominal masses GU: No CVA tenderness.  Foley catheter in place draining clear yellow urine. Neurologic: Grossly intact, no focal deficits, moving all 4 extremities. Psychiatric: Normal mood and affect.  Laboratory Data: Lab Results  Component Value Date   WBC 19.1 (H) 11/25/2016   HGB 15.3 11/25/2016   HCT 45.1 11/25/2016   MCV 87.1 11/25/2016   PLT 247 11/25/2016    Lab Results  Component Value Date   CREATININE 1.26 (H) 11/25/2016    Lab Results  Component Value Date   PSA1 8.5 (H) 10/11/2016   PSA1 12.3 (H) 07/21/2015    Lab Results  Component Value Date   HGBA1C 8.2 07/21/2015    Urinalysis N/a  Pertinent Imaging: N/a  Assessment & Plan:    1. Benign prostatic hyperplasia with urinary retention He has declined urodynamics Given the size of his prostate, would recommend either simple open prostatectomy, robotic simple prostatectomy, or holmium laser enucleation of the prostate.  Risk and benefits of each were discussed along with the intraoperative and postoperative recovery used for each.  We reviewed the data comparing each of the 3 techniques in detail.  After lengthy discussion, he would like to pursue holmium laser enucleation of the prostate.     We discussed the risk of the procedure including risk of bleeding, infection, damage to surrounding structures, need for postoperative Foley catheter, damage to the bladder, bladder neck, ureters, stress incontinence, ejaculatory dysfunction including retrograde ejaculation, failure to improve retention, increased irritative voiding symptoms were all discussed in detail.  He understands all these risks and is willing to proceed as planned.   2. Phimosis We discussed circumcision today.  He has a history of recurrent phimosis and is used creams with variable success in the past.  Since he will be under anesthesia, he is requesting circumcision.  He understands the risks including bleeding, infection, damage to surrounding structures, change or dissatisfaction with cosmesis.  3.  Elevated PSA PSA somewhat elevated, however, appropriate for size of gland Downward trend is reassuring No indication for biopsy at this time, will continue to follow  Schedule holep, circumcision  Hollice Espy, MD  [facility] [address], [place], [place] [address] [phone]  I spent 25 min with this patient of which greater than 50% was spent in counseling and coordination of care with the patient.

## 2017-01-25 NOTE — H&P (View-Only) (Signed)
01/16/2017 2:17 PM   Tyler Morgan October 22, 1945 [id number]  Referring provider: Guadalupe Maple, MD [address] [place], [place] [address]  Chief Complaint  Patient presents with  . Benign Prostatic Hypertrophy    discuss surgery    HPI: 71 year old male with urinary retention presumably secondary to BPH status post multiple failed voiding trials.  He returns today to discuss possible outlet procedure.  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  Additionally, he underwent prostate sizing with a transitional ultrasound of 182 cc.  He is currently on Flomax 0.8 mg as well as recently added finasteride to his regimen.  He does report that prior to going into retention, he noted a weaker urinary stream but otherwise had no significant issues urinating.  His wife thinks that perhaps his obstructive symptoms have been going on for longer than he appreciates.  He does have a personal history of elevated PSA.  He is undergone biopsy in the past which was negative (~10 years ago with Dr. Bernardo Heater, records not available).  Most recent PSA 8.5 on 10/11/16 down from 12.3 one year ago.     He also has a history of significant phimosis.  He is used creams with some success but is unable to completely retract his foreskin.  He may be interested in circumcision at the time of outlet procedure.   PMH: Past Medical History:  Diagnosis Date  . Diabetes mellitus without complication ([facility])   . ED (erectile dysfunction)   . Hypertension     Surgical History: Past Surgical History:  Procedure Laterality Date  . CATARACT EXTRACTION Bilateral 1986, 2014  . EYE SURGERY Bilateral [id number]  . FRACTURE SURGERY Right    as a child/ right arm  . HERNIA REPAIR Right 01-13-14   inguinal hernia  . RETINAL DETACHMENT SURGERY  Jan 2015  . TONSILLECTOMY  1995    Home Medications:  Allergies as of  01/16/2017      Reactions   Acetaminophen Swelling   Swelling of liver  swelling   Amlodipine Swelling      Medication List        Accurate as of 01/16/17 11:59 PM. Always use your most recent med list.          Alpha-Lipoic Acid 600 MG Caps Take by mouth.   atenolol 100 MG tablet Commonly known as:  TENORMIN Take 1 tablet (100 mg total) by mouth daily.   benazepril 40 MG tablet Commonly known as:  LOTENSIN Take 1 tablet (40 mg total) by mouth daily.   calcium carbonate 600 MG Tabs tablet Commonly known as:  OS-CAL Take 600 mg by mouth 2 (two) times daily with a meal.   empagliflozin 25 MG Tabs tablet Commonly known as:  JARDIANCE Take 25 mg by mouth daily.   finasteride 5 MG tablet Commonly known as:  PROSCAR Take 1 tablet (5 mg total) by mouth daily.   hydrochlorothiazide 25 MG tablet Commonly known as:  HYDRODIURIL Take 1 tablet (25 mg total) by mouth daily.   metFORMIN 500 MG tablet Commonly known as:  GLUCOPHAGE Take 2 tablets (1,000 mg total) by mouth 2 (two) times daily with a meal.   multivitamin with minerals tablet Take 1 tablet by mouth daily.   phenazopyridine 200 MG tablet Commonly known as:  PYRIDIUM Take 1 tablet (200 mg total) by mouth 3 (three) times daily as needed for pain.   tamsulosin 0.4  MG Caps capsule Commonly known as:  FLOMAX Take 2 capsules (0.8 mg total) by mouth daily.   VITAMIN B COMPLEX PO Take 50 mg by mouth.   Vitamin D-3 1000 units Caps Take 2,000 Units by mouth daily.       Allergies:  Allergies  Allergen Reactions  . Acetaminophen Swelling and Other (See Comments)    Swelling of liver   . Amlodipine Swelling    Family History: Family History  Problem Relation Age of Onset  . Colon cancer Father   . Angina Father   . Macular degeneration Father   . Alzheimer's disease Mother   . Prostate cancer Neg Hx   . Bladder Cancer Neg Hx   . Kidney cancer Neg Hx     Social History:  reports that he quit  smoking about 32 years ago. His smoking use included cigarettes. He has a 13.00 pack-year smoking history. he has never used smokeless tobacco. He reports that he drinks alcohol. He reports that he does not use drugs.  ROS: 12 point review of systems is negative other than as per HPI.  Physical Exam: BP (!) 103/57   Pulse 73   Ht 6\' 1"  (1.854 m)   Wt 184 lb (83.5 kg)   BMI 24.28 kg/m   Constitutional:  Alert and oriented, No acute distress.  Accompanied today by his wife. HEENT: [place] AT, moist mucus membranes.  Trachea midline, no masses. Cardiovascular: No clubbing, cyanosis, or edema. Respiratory: Normal respiratory effort, no increased work of breathing. GI: Abdomen is soft, nontender, nondistended, no abdominal masses GU: No CVA tenderness.  Foley catheter in place draining clear yellow urine. Neurologic: Grossly intact, no focal deficits, moving all 4 extremities. Psychiatric: Normal mood and affect.  Laboratory Data: Lab Results  Component Value Date   WBC 19.1 (H) 11/25/2016   HGB 15.3 11/25/2016   HCT 45.1 11/25/2016   MCV 87.1 11/25/2016   PLT 247 11/25/2016    Lab Results  Component Value Date   CREATININE 1.26 (H) 11/25/2016    Lab Results  Component Value Date   PSA1 8.5 (H) 10/11/2016   PSA1 12.3 (H) 07/21/2015    Lab Results  Component Value Date   HGBA1C 8.2 07/21/2015    Urinalysis N/a  Pertinent Imaging: N/a  Assessment & Plan:    1. Benign prostatic hyperplasia with urinary retention He has declined urodynamics Given the size of his prostate, would recommend either simple open prostatectomy, robotic simple prostatectomy, or holmium laser enucleation of the prostate.  Risk and benefits of each were discussed along with the intraoperative and postoperative recovery used for each.  We reviewed the data comparing each of the 3 techniques in detail.  After lengthy discussion, he would like to pursue holmium laser enucleation of the prostate.     We discussed the risk of the procedure including risk of bleeding, infection, damage to surrounding structures, need for postoperative Foley catheter, damage to the bladder, bladder neck, ureters, stress incontinence, ejaculatory dysfunction including retrograde ejaculation, failure to improve retention, increased irritative voiding symptoms were all discussed in detail.  He understands all these risks and is willing to proceed as planned.   2. Phimosis We discussed circumcision today.  He has a history of recurrent phimosis and is used creams with variable success in the past.  Since he will be under anesthesia, he is requesting circumcision.  He understands the risks including bleeding, infection, damage to surrounding structures, change or dissatisfaction with cosmesis.  3.  Elevated PSA PSA somewhat elevated, however, appropriate for size of gland Downward trend is reassuring No indication for biopsy at this time, will continue to follow  Schedule holep, circumcision  Hollice Espy, MD  [facility] [address] [place], [place] [address] [phone]  I spent 25 min with this patient of which greater than 50% was spent in counseling and coordination of care with the patient.

## 2017-01-26 ENCOUNTER — Other Ambulatory Visit: Payer: Self-pay | Admitting: Radiology

## 2017-01-26 ENCOUNTER — Ambulatory Visit (INDEPENDENT_AMBULATORY_CARE_PROVIDER_SITE_OTHER): Payer: Medicare Other | Admitting: Family Medicine

## 2017-01-26 ENCOUNTER — Encounter: Payer: Self-pay | Admitting: Family Medicine

## 2017-01-26 VITALS — BP 139/74 | HR 64 | Wt 187.0 lb

## 2017-01-26 DIAGNOSIS — I10 Essential (primary) hypertension: Secondary | ICD-10-CM | POA: Diagnosis not present

## 2017-01-26 MED ORDER — ATENOLOL 50 MG PO TABS: 50.0000 mg | ORAL_TABLET | Freq: Every day | ORAL | 1 refills | Status: DC

## 2017-01-26 NOTE — Assessment & Plan Note (Signed)
BP stable on 50 mg, and pt reporting symptomatic improvement. Continue this regimen and home monitoring, call with persistent abnormal readings. F/u in 2 months for regular 6 month check

## 2017-01-26 NOTE — Progress Notes (Signed)
BP 139/74   Pulse 64   Wt 187 lb (84.8 kg)   SpO2 97%   BMI 24.67 kg/m    Subjective:    Patient ID: Tyler Morgan, male    DOB: [birth year], 71 y.o.   MRN: [id number]  HPI: Tyler Morgan is a 71 y.o. male  Chief Complaint  Patient presents with  . Hypertension   Patient here for 1 week f/u BP/HR after reducing atenolol from 100 to 50 mg daily. Pt was having frequent dizzy spells and hypotension - doing much better on reduced dose, no dizzy spells or fatigue the past week. Has not been checking home readings as he's had a very busy week.   Relevant past medical, surgical, family and social history reviewed and updated as indicated. Interim medical history since our last visit reviewed. Allergies and medications reviewed and updated.  Review of Systems  Constitutional: Negative.   HENT: Negative.   Respiratory: Negative.   Cardiovascular: Negative.   Musculoskeletal: Negative.   Neurological: Negative.   Psychiatric/Behavioral: Negative.    Per HPI unless specifically indicated above     Objective:    BP 139/74   Pulse 64   Wt 187 lb (84.8 kg)   SpO2 97%   BMI 24.67 kg/m   Wt Readings from Last 3 Encounters:  01/26/17 187 lb (84.8 kg)  01/24/17 187 lb (84.8 kg)  01/19/17 187 lb 4 oz (84.9 kg)    Physical Exam  Constitutional: He is oriented to person, place, and time. He appears well-developed and well-nourished.  HENT:  Head: Atraumatic.  Eyes: Conjunctivae are normal. Pupils are equal, round, and reactive to light.  Neck: Normal range of motion. Neck supple.  Cardiovascular: Normal rate, regular rhythm and normal heart sounds.  Pulmonary/Chest: Effort normal and breath sounds normal. No respiratory distress.  Musculoskeletal: Normal range of motion.  Neurological: He is alert and oriented to person, place, and time.  Skin: Skin is warm and dry.  Psychiatric: He has a normal mood and affect. His behavior is normal.  Nursing note and vitals  reviewed.  Results for orders placed or performed during the hospital encounter of 12/23/16  Urinalysis, Complete w Microscopic  Result Value Ref Range   Color, Urine AMBER (A) YELLOW   APPearance CLEAR (A) CLEAR   Specific Gravity, Urine 1.036 (H) 1.005 - 1.030   pH 5.0 5.0 - 8.0   Glucose, UA >=500 (A) NEGATIVE mg/dL   Hgb urine dipstick SMALL (A) NEGATIVE   Bilirubin Urine NEGATIVE NEGATIVE   Ketones, ur 20 (A) NEGATIVE mg/dL   Protein, ur NEGATIVE NEGATIVE mg/dL   Nitrite POSITIVE (A) NEGATIVE   Leukocytes, UA NEGATIVE NEGATIVE   RBC / HPF 0-5 0 - 5 RBC/hpf   WBC, UA 6-30 0 - 5 WBC/hpf   Bacteria, UA NONE SEEN NONE SEEN   Squamous Epithelial / LPF NONE SEEN NONE SEEN      Assessment & Plan:   Problem List Items Addressed This Visit      Cardiovascular and Mediastinum   Hypertension - Primary    BP stable on 50 mg, and pt reporting symptomatic improvement. Continue this regimen and home monitoring, call with persistent abnormal readings. F/u in 2 months for regular 6 month check      Relevant Medications   atenolol (TENORMIN) 50 MG tablet       Follow up plan: Return in about 2 months (around 03/29/2017) for 6 month f/u BP, A1C.

## 2017-01-26 NOTE — Patient Instructions (Signed)
Follow up in 2 months

## 2017-01-27 LAB — CULTURE, URINE COMPREHENSIVE

## 2017-01-30 ENCOUNTER — Encounter
Admission: RE | Admit: 2017-01-30 | Discharge: 2017-01-30 | Disposition: A | Discharge status: Home / Self Care | Payer: Medicare Other | Source: Ambulatory Visit | Attending: Urology | Admitting: Urology

## 2017-01-30 ENCOUNTER — Other Ambulatory Visit: Payer: Self-pay

## 2017-01-30 ENCOUNTER — Other Ambulatory Visit: Payer: Self-pay | Admitting: Radiology

## 2017-01-30 ENCOUNTER — Telehealth: Payer: Self-pay | Admitting: Radiology

## 2017-01-30 DIAGNOSIS — Z01810 Encounter for preprocedural cardiovascular examination: Secondary | ICD-10-CM | POA: Insufficient documentation

## 2017-01-30 DIAGNOSIS — I10 Essential (primary) hypertension: Secondary | ICD-10-CM | POA: Insufficient documentation

## 2017-01-30 DIAGNOSIS — Z01812 Encounter for preprocedural laboratory examination: Secondary | ICD-10-CM | POA: Insufficient documentation

## 2017-01-30 HISTORY — DX: Polyneuropathy, unspecified: G62.9

## 2017-01-30 HISTORY — DX: Benign prostatic hyperplasia without lower urinary tract symptoms: N40.0

## 2017-01-30 LAB — CBC
HCT: 44.3 % (ref 40.0–52.0)
HEMOGLOBIN: 14.9 g/dL (ref 13.0–18.0)
MCH: 29 pg (ref 26.0–34.0)
MCHC: 33.7 g/dL (ref 32.0–36.0)
MCV: 85.8 fL (ref 80.0–100.0)
Platelets: 208 10*3/uL (ref 150–440)
RBC: 5.16 MIL/uL (ref 4.40–5.90)
RDW: 14.4 % (ref 11.5–14.5)
WBC: 5.3 10*3/uL (ref 3.8–10.6)

## 2017-01-30 LAB — BASIC METABOLIC PANEL
Anion gap: 11 (ref 5–15)
BUN: 20 mg/dL (ref 6–20)
CHLORIDE: 102 mmol/L (ref 101–111)
CO2: 24 mmol/L (ref 22–32)
Calcium: 9.1 mg/dL (ref 8.9–10.3)
Creatinine, Ser: 0.68 mg/dL (ref 0.61–1.24)
GFR calc Af Amer: >60 mL/min (ref >60)
GFR calc non Af Amer: >60 mL/min (ref >60)
GLUCOSE: 106 mg/dL — AB (ref 65–99)
POTASSIUM: 3.5 mmol/L (ref 3.5–5.1)
Sodium: 137 mmol/L (ref 135–145)

## 2017-01-30 MED ORDER — SULFAMETHOXAZOLE-TRIMETHOPRIM 800-160 MG PO TABS: 1.0000 | ORAL_TABLET | Freq: Two times a day (BID) | ORAL | 0 refills | Status: DC

## 2017-01-30 NOTE — Patient Instructions (Signed)
Your procedure is scheduled on: 02/07/17 Wed Report to Same Day Surgery 2nd floor medical mall [facility] Entrance-take elevator on left to 2nd floor.  Check in with surgery information desk.) To find out your arrival time please call [phone] between 1PM - 3PM on 02/06/17 Tues  Remember: Instructions that are not followed completely may result in serious medical risk, up to and including death, or upon the discretion of your surgeon and anesthesiologist your surgery may need to be rescheduled.    _x___ 1. Do not eat food after midnight the night before your procedure. You may drink clear liquids up to 2 hours before you are scheduled to arrive at the hospital for your procedure.  Do not drink clear liquids within 2 hours of your scheduled arrival to the hospital.  Clear liquids include  --Water or Apple juice without pulp  --Clear carbohydrate beverage such as ClearFast or Gatorade  --Black Coffee or Clear Tea (No milk, no creamers, do not add anything to                  the coffee or Tea Type 1 and type 2 diabetics should only drink water.  No gum chewing or hard candies.     __x__ 2. No Alcohol for 24 hours before or after surgery.   __x__3. No Smoking for 24 prior to surgery.   ____  4. Bring all medications with you on the day of surgery if instructed.    __x__ 5. Notify your doctor if there is any change in your medical condition     (cold, fever, infections).     Do not wear jewelry, make-up, hairpins, clips or nail polish.  Do not wear lotions, powders, or perfumes. You may wear deodorant.  Do not shave 48 hours prior to surgery. Men may shave face and neck.  Do not bring valuables to the hospital.    [facility] is not responsible for any belongings or valuables.               Contacts, dentures or bridgework may not be worn into surgery.  Leave your suitcase in the car. After surgery it may be brought to your room.  For patients admitted to the hospital,  discharge time is determined by your                       treatment team.   Patients discharged the day of surgery will not be allowed to drive home.  You will need someone to drive you home and stay with you the night of your procedure.    Please read over the following fact sheets that you were given:   [facility] Preparing for Surgery and or MRSA Information   _x___ Take anti-hypertensive listed below, cardiac, seizure, asthma,     anti-reflux and psychiatric medicines. These include:  1. atenolol (TENORMIN) 50 MG tablet  2.  3.  4.  5.  6.  ____Fleets enema or Magnesium Citrate as directed.   _x___ Use CHG Soap or sage wipes as directed on instruction sheet   ____ Use inhalers on the day of surgery and bring to hospital day of surgery  _x___ Stop Metformin and Janumet 2 days prior to surgery.    ____ Take 1/2 of usual insulin dose the night before surgery and none on the morning     surgery.   _x___ Follow recommendations from Cardiologist, Pulmonologist or PCP regarding  stopping Aspirin, Coumadin, Plavix ,Eliquis, Effient, or Pradaxa, and Pletal.  X____Stop Anti-inflammatories such as Advil, Aleve, Ibuprofen, Motrin, Naproxen, Naprosyn, Goodies powders or aspirin products. OK to take Tylenol and   Celebrex.     Stop Tumeric until after surgery.  _x___ Stop supplements until after surgery.  But may continue Vitamin D, Vitamin B,       and multivitamin.   ____ Bring C-Pap to the hospital.

## 2017-01-30 NOTE — Telephone Encounter (Signed)
Notified pt of +ucx & script sent to pharmacy. Dosing instructions given. Pt voices understanding.  Pt states after second thought he would like to postpone having the circumcision done at this time. Will notify Dr Erlene Quan of this.

## 2017-01-30 NOTE — Telephone Encounter (Signed)
-----   Message from Hollice Espy, MD sent at 01/30/2017  7:27 AM EST ----- Lets start Bactrim ds bid x 7 days prior.  Hollice Espy, MD

## 2017-01-31 ENCOUNTER — Other Ambulatory Visit: Payer: Self-pay

## 2017-02-06 MED ORDER — CEFAZOLIN SODIUM-DEXTROSE 1-4 GM/50ML-% IV SOLN
1.0000 g | INTRAVENOUS | Status: AC
  Administered 2017-02-07: 1 g via INTRAVENOUS

## 2017-02-07 ENCOUNTER — Other Ambulatory Visit: Payer: Self-pay

## 2017-02-07 ENCOUNTER — Encounter
Admission: RE | Disposition: A | Discharge status: Home / Self Care | Payer: Self-pay | Source: Ambulatory Visit | Attending: Urology

## 2017-02-07 ENCOUNTER — Ambulatory Visit: Payer: Medicare Other | Admitting: Certified Registered Nurse Anesthetist

## 2017-02-07 ENCOUNTER — Encounter: Payer: Self-pay | Admitting: *Deleted

## 2017-02-07 ENCOUNTER — Ambulatory Visit
Admission: RE | Admit: 2017-02-07 | Discharge: 2017-02-07 | Disposition: A | Discharge status: Home / Self Care | Payer: Medicare Other | Source: Ambulatory Visit | Attending: Urology | Admitting: Urology

## 2017-02-07 DIAGNOSIS — N401 Enlarged prostate with lower urinary tract symptoms: Secondary | ICD-10-CM | POA: Insufficient documentation

## 2017-02-07 DIAGNOSIS — E1140 Type 2 diabetes mellitus with diabetic neuropathy, unspecified: Secondary | ICD-10-CM | POA: Insufficient documentation

## 2017-02-07 DIAGNOSIS — N471 Phimosis: Secondary | ICD-10-CM | POA: Diagnosis not present

## 2017-02-07 DIAGNOSIS — I10 Essential (primary) hypertension: Secondary | ICD-10-CM | POA: Diagnosis not present

## 2017-02-07 DIAGNOSIS — Z79899 Other long term (current) drug therapy: Secondary | ICD-10-CM | POA: Insufficient documentation

## 2017-02-07 DIAGNOSIS — N138 Other obstructive and reflux uropathy: Secondary | ICD-10-CM | POA: Insufficient documentation

## 2017-02-07 DIAGNOSIS — C61 Malignant neoplasm of prostate: Secondary | ICD-10-CM | POA: Insufficient documentation

## 2017-02-07 DIAGNOSIS — R338 Other retention of urine: Secondary | ICD-10-CM | POA: Insufficient documentation

## 2017-02-07 DIAGNOSIS — Z7984 Long term (current) use of oral hypoglycemic drugs: Secondary | ICD-10-CM | POA: Insufficient documentation

## 2017-02-07 DIAGNOSIS — Z87891 Personal history of nicotine dependence: Secondary | ICD-10-CM | POA: Diagnosis not present

## 2017-02-07 HISTORY — PX: 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: SHX6641

## 2017-02-07 HISTORY — DX: Retention of urine, unspecified: R33.9

## 2017-02-07 LAB — GLUCOSE, CAPILLARY
GLUCOSE-CAPILLARY: 118 mg/dL — AB (ref 65–99)
GLUCOSE-CAPILLARY: 154 mg/dL — AB (ref 65–99)

## 2017-02-07 SURGERY — ENUCLEATION, PROSTATE, USING LASER, WITH MORCELLATION
Anesthesia: General | Wound class: Clean Contaminated

## 2017-02-07 MED ORDER — LIDOCAINE HCL (CARDIAC) 20 MG/ML IV SOLN
INTRAVENOUS | Status: DC | PRN
  Administered 2017-02-07: 100 mg via INTRAVENOUS

## 2017-02-07 MED ORDER — FAMOTIDINE 20 MG PO TABS
20.0000 mg | ORAL_TABLET | Freq: Once | ORAL | Status: AC
  Administered 2017-02-07: 20 mg via ORAL

## 2017-02-07 MED ORDER — FUROSEMIDE 10 MG/ML IJ SOLN
INTRAMUSCULAR | Status: AC
  Filled 2017-02-07: qty 4

## 2017-02-07 MED ORDER — DEXAMETHASONE SODIUM PHOSPHATE 10 MG/ML IJ SOLN
INTRAMUSCULAR | Status: AC
  Filled 2017-02-07: qty 1

## 2017-02-07 MED ORDER — FENTANYL CITRATE (PF) 100 MCG/2ML IJ SOLN
INTRAMUSCULAR | Status: AC
  Filled 2017-02-07: qty 2

## 2017-02-07 MED ORDER — ONDANSETRON HCL 4 MG/2ML IJ SOLN: 4.0000 mg | Freq: Once | INTRAMUSCULAR | Status: DC | PRN

## 2017-02-07 MED ORDER — SUCCINYLCHOLINE CHLORIDE 20 MG/ML IJ SOLN
INTRAMUSCULAR | Status: DC | PRN
  Administered 2017-02-07: 100 mg via INTRAVENOUS

## 2017-02-07 MED ORDER — PROPOFOL 10 MG/ML IV BOLUS
INTRAVENOUS | Status: DC | PRN
  Administered 2017-02-07: 150 mg via INTRAVENOUS

## 2017-02-07 MED ORDER — MIDAZOLAM HCL 2 MG/2ML IJ SOLN
INTRAMUSCULAR | Status: DC | PRN
  Administered 2017-02-07: 1 mg via INTRAVENOUS

## 2017-02-07 MED ORDER — FENTANYL CITRATE (PF) 100 MCG/2ML IJ SOLN
INTRAMUSCULAR | Status: AC
  Administered 2017-02-07: 25 ug via INTRAVENOUS
  Filled 2017-02-07: qty 2

## 2017-02-07 MED ORDER — ROCURONIUM BROMIDE 50 MG/5ML IV SOLN
INTRAVENOUS | Status: AC
  Filled 2017-02-07: qty 1

## 2017-02-07 MED ORDER — LIDOCAINE HCL (PF) 2 % IJ SOLN
INTRAMUSCULAR | Status: AC
  Filled 2017-02-07: qty 10

## 2017-02-07 MED ORDER — CEFAZOLIN SODIUM-DEXTROSE 1-4 GM/50ML-% IV SOLN
INTRAVENOUS | Status: AC
  Filled 2017-02-07: qty 50

## 2017-02-07 MED ORDER — SODIUM CHLORIDE 0.9 % IV SOLN
INTRAVENOUS | Status: DC
  Administered 2017-02-07: 10:00:00 via INTRAVENOUS

## 2017-02-07 MED ORDER — FENTANYL CITRATE (PF) 100 MCG/2ML IJ SOLN
25.0000 ug | INTRAMUSCULAR | Status: DC | PRN
  Administered 2017-02-07 (×3): 25 ug via INTRAVENOUS

## 2017-02-07 MED ORDER — FENTANYL CITRATE (PF) 100 MCG/2ML IJ SOLN
INTRAMUSCULAR | Status: DC | PRN
  Administered 2017-02-07: 50 ug via INTRAVENOUS
  Administered 2017-02-07: 25 ug via INTRAVENOUS
  Administered 2017-02-07 (×2): 50 ug via INTRAVENOUS
  Administered 2017-02-07: 25 ug via INTRAVENOUS

## 2017-02-07 MED ORDER — FUROSEMIDE 10 MG/ML IJ SOLN
INTRAMUSCULAR | Status: DC | PRN
  Administered 2017-02-07: 10 mg via INTRAMUSCULAR

## 2017-02-07 MED ORDER — DOCUSATE SODIUM 100 MG PO CAPS: 100.0000 mg | ORAL_CAPSULE | Freq: Two times a day (BID) | ORAL | 0 refills | Status: DC

## 2017-02-07 MED ORDER — MIDAZOLAM HCL 2 MG/2ML IJ SOLN
INTRAMUSCULAR | Status: AC
  Filled 2017-02-07: qty 2

## 2017-02-07 MED ORDER — EPHEDRINE SULFATE 50 MG/ML IJ SOLN
INTRAMUSCULAR | Status: DC | PRN
  Administered 2017-02-07 (×4): 5 mg via INTRAVENOUS

## 2017-02-07 MED ORDER — FAMOTIDINE 20 MG PO TABS
ORAL_TABLET | ORAL | Status: AC
  Filled 2017-02-07: qty 1

## 2017-02-07 MED ORDER — ONDANSETRON HCL 4 MG/2ML IJ SOLN
INTRAMUSCULAR | Status: AC
  Filled 2017-02-07: qty 2

## 2017-02-07 MED ORDER — PROPOFOL 10 MG/ML IV BOLUS
INTRAVENOUS | Status: AC
  Filled 2017-02-07: qty 20

## 2017-02-07 MED ORDER — OXYCODONE HCL 5 MG PO CAPS: 5.0000 mg | ORAL_CAPSULE | ORAL | 0 refills | Status: DC | PRN

## 2017-02-07 MED ORDER — ROCURONIUM BROMIDE 100 MG/10ML IV SOLN
INTRAVENOUS | Status: DC | PRN
  Administered 2017-02-07: 30 mg via INTRAVENOUS
  Administered 2017-02-07: 20 mg via INTRAVENOUS
  Administered 2017-02-07 (×2): 10 mg via INTRAVENOUS

## 2017-02-07 MED ORDER — ONDANSETRON HCL 4 MG/2ML IJ SOLN
INTRAMUSCULAR | Status: DC | PRN
  Administered 2017-02-07: 4 mg via INTRAVENOUS

## 2017-02-07 MED ORDER — DEXAMETHASONE SODIUM PHOSPHATE 10 MG/ML IJ SOLN
INTRAMUSCULAR | Status: DC | PRN
  Administered 2017-02-07: 5 mg via INTRAVENOUS

## 2017-02-07 MED ORDER — OXYBUTYNIN CHLORIDE 5 MG PO TABS: 5.0000 mg | ORAL_TABLET | Freq: Three times a day (TID) | ORAL | 0 refills | Status: DC | PRN

## 2017-02-07 MED ORDER — SUGAMMADEX SODIUM 200 MG/2ML IV SOLN
INTRAVENOUS | Status: DC | PRN
Start: 2017-02-07 — End: 2017-02-07
  Administered 2017-02-07: 200 mg via INTRAVENOUS

## 2017-02-07 SURGICAL SUPPLY — 33 items
ADAPTER IRRIG TUBE 2 SPIKE SOL (ADAPTER) ×6 IMPLANT
BAG URINE DRAINAGE (UROLOGICAL SUPPLIES) ×3 IMPLANT
BAG URO DRAIN 4000ML (MISCELLANEOUS) IMPLANT
CATH FOL 2WAY LX 20X30 (CATHETERS) ×3 IMPLANT
CATH FOL 2WAY LX 22X30 (CATHETERS) IMPLANT
CATH FOLEY 3WAY 30CC 22FR (CATHETERS) IMPLANT
CATH URETL 5X70 OPEN END (CATHETERS) IMPLANT
CONTAINER COLLECT MORCELLATR (MISCELLANEOUS) ×1 IMPLANT
DRAPE INCISE IOBAN 66X45 STRL (DRAPES) ×3 IMPLANT
DRAPE SHEET LG 3/4 BI-LAMINATE (DRAPES) ×3 IMPLANT
DRAPE UTILITY 15X26 TOWEL STRL (DRAPES) IMPLANT
FILTER OVERFLOW MORCELLATOR (FILTER) ×1 IMPLANT
GLOVE BIO SURGEON STRL SZ 6.5 (GLOVE) ×4 IMPLANT
GLOVE BIO SURGEONS STRL SZ 6.5 (GLOVE) ×2
GOWN STRL REUS W/ TWL LRG LVL3 (GOWN DISPOSABLE) ×2 IMPLANT
GOWN STRL REUS W/TWL LRG LVL3 (GOWN DISPOSABLE) ×4
HOLDER FOLEY CATH W/STRAP (MISCELLANEOUS) IMPLANT
KIT RM TURNOVER CYSTO AR (KITS) ×3 IMPLANT
LASER FIBER 550M SMARTSCOPE (Laser) ×3 IMPLANT
MORCELLATOR COLLECT CONTAINER (MISCELLANEOUS) ×3
MORCELLATOR OVERFLOW FILTER (FILTER) ×3
MORCELLATOR ROTATION 4.75 335 (MISCELLANEOUS) ×3 IMPLANT
PACK CYSTO AR (MISCELLANEOUS) ×3 IMPLANT
SENSORWIRE 0.038 NOT ANGLED (WIRE)
SET CYSTO W/LG BORE CLAMP LF (SET/KITS/TRAYS/PACK) IMPLANT
SET IRRIG Y TYPE TUR BLADDER L (SET/KITS/TRAYS/PACK) ×3 IMPLANT
SLEEVE PROTECTION STRL DISP (MISCELLANEOUS) ×6 IMPLANT
SOL .9 NS 3000ML IRR  AL (IV SOLUTION) ×44
SOL .9 NS 3000ML IRR UROMATIC (IV SOLUTION) ×22 IMPLANT
SYRINGE IRR TOOMEY STRL 70CC (SYRINGE) ×3 IMPLANT
TUBE PUMP MORCELLATOR PIRANHA (TUBING) ×3 IMPLANT
WATER STERILE IRR 1000ML POUR (IV SOLUTION) ×3 IMPLANT
WIRE SENSOR 0.038 NOT ANGLED (WIRE) IMPLANT

## 2017-02-07 NOTE — Anesthesia Post-op Follow-up Note (Signed)
Anesthesia QCDR form completed.        

## 2017-02-07 NOTE — OR Nursing (Signed)
Bladder irrigation teaching performed with pt/spouse; advise they understand technique, supplies given for same at home.   Leg bag applied prior to d/c & leg bag given for home use, has used both bags in the past and feels comfortable changing them

## 2017-02-07 NOTE — Anesthesia Postprocedure Evaluation (Signed)
Anesthesia Post Note  Patient: Tyler Morgan  Procedure(s) Performed: 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 (N/A )  Patient location during evaluation: PACU Anesthesia Type: General Level of consciousness: awake and alert and oriented Pain management: pain level controlled Vital Signs Assessment: post-procedure vital signs reviewed and stable Respiratory status: spontaneous breathing Cardiovascular status: blood pressure returned to baseline Anesthetic complications: no     Last Vitals:  Vitals:   02/07/17 0958 02/07/17 1513  BP: 134/83 133/84  Pulse: (!) 57 66  Resp: 14 14  Temp: 36.4 C (!) 36.3 C  SpO2: 99% 100%    Last Pain:  Vitals:   02/07/17 1513  TempSrc: Tympanic  PainSc: Asleep                 Jodell Weitman

## 2017-02-07 NOTE — Anesthesia Preprocedure Evaluation (Signed)
Anesthesia Evaluation  Patient identified by MRN, date of birth, ID band Patient awake    Reviewed: Allergy & Precautions, NPO status , Patient's Chart, lab work & pertinent test results, reviewed documented beta blocker date and time   Airway Mallampati: III  TM Distance: >3 FB     Dental  (+) Caps, Teeth Intact   Pulmonary former smoker,    Pulmonary exam normal        Cardiovascular hypertension, Pt. on medications and Pt. on home beta blockers Normal cardiovascular exam     Neuro/Psych  Neuromuscular disease negative psych ROS   GI/Hepatic negative GI ROS, Neg liver ROS,   Endo/Other  diabetes, Well Controlled, Type 2, Oral Hypoglycemic Agents  Renal/GU negative Renal ROS     Musculoskeletal Calf muscle weakness   Abdominal Normal abdominal exam  (+)   Peds negative pediatric ROS (+)  Hematology negative hematology ROS (+)   Anesthesia Other Findings Past Medical History: No date: Diabetes mellitus without complication (HCC) No date: ED (erectile dysfunction) No date: Hypertension No date: Neuropathy No date: Prostate enlargement No date: Urinary retention  Reproductive/Obstetrics                             Anesthesia Physical Anesthesia Plan  ASA: II  Anesthesia Plan: General   Post-op Pain Management:    Induction: Intravenous  PONV Risk Score and Plan:   Airway Management Planned: Oral ETT  Additional Equipment:   Intra-op Plan:   Post-operative Plan: Extubation in OR  Informed Consent: I have reviewed the patients History and Physical, chart, labs and discussed the procedure including the risks, benefits and alternatives for the proposed anesthesia with the patient or authorized representative who has indicated his/her understanding and acceptance.   Dental advisory given  Plan Discussed with: CRNA and Surgeon  Anesthesia Plan Comments:          Anesthesia Quick Evaluation

## 2017-02-07 NOTE — Interval H&P Note (Signed)
History and Physical Interval Note:  02/07/2017 10:39 AM  Tyler Morgan  has presented today for surgery, with the diagnosis of Benign Prostatic Hypertrophy with urinary retention,PHIMOSIS  The various methods of treatment have been discussed with the patient and family. After consideration of risks, benefits and other options for treatment, the patient has consented to  Procedure(s): [place] WITH MORCELLATION (N/A) as a surgical intervention .  The patient's history has been reviewed, patient examined, no change in status, stable for surgery.  I have reviewed the patient's chart and labs.  Questions were answered to the patient's satisfaction.    RRR CTAB  Hollice Espy

## 2017-02-07 NOTE — Anesthesia Procedure Notes (Signed)
Procedure Name: Intubation Date/Time: 02/07/2017 11:19 AM Performed by: Johnna Acosta, CRNA Pre-anesthesia Checklist: Patient identified, Emergency Drugs available, Suction available, Patient being monitored and Timeout performed Patient Re-evaluated:Patient Re-evaluated prior to induction Oxygen Delivery Method: Circle system utilized Preoxygenation: Pre-oxygenation with 100% oxygen Induction Type: IV induction Ventilation: Mask ventilation with difficulty and Oral airway inserted - appropriate to patient size Laryngoscope Size: Sabra Heck, 2, McGraph and 4 Grade View: Grade II Tube size: 7.5 mm Number of attempts: 2 Airway Equipment and Method: Stylet,  Oral airway and Video-laryngoscopy Placement Confirmation: ETT inserted through vocal cords under direct vision,  positive ETCO2 and breath sounds checked- equal and bilateral Secured at: 23 cm Tube secured with: Tape Dental Injury: Teeth and Oropharynx as per pre-operative assessment  Difficulty Due To: Difficulty was unanticipated, Difficult Airway- due to reduced neck mobility, Difficult Airway- due to limited oral opening and Difficult Airway- due to anterior larynx

## 2017-02-07 NOTE — Transfer of Care (Signed)
Immediate Anesthesia Transfer of Care Note  Patient: Tyler Morgan  Procedure(s) Performed: 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 (N/A )  Patient Location: PACU  Anesthesia Type:General  Level of Consciousness: sedated  Airway & Oxygen Therapy: Patient Spontanous Breathing and Patient connected to face mask oxygen  Post-op Assessment: Report given to RN and Post -op Vital signs reviewed and stable  Post vital signs: Reviewed and stable  Last Vitals:  Vitals:   02/07/17 0958 02/07/17 1513  BP: 134/83 133/84  Pulse: (!) 57 66  Resp: 14 14  Temp: 36.4 C (!) 36.3 C  SpO2: 99% 100%    Last Pain:  Vitals:   02/07/17 1513  TempSrc: Tympanic         Complications: No apparent anesthesia complications

## 2017-02-07 NOTE — Discharge Instructions (Addendum)
Indwelling Urinary Catheter Care, Adult Take good care of your catheter to keep it working and to prevent problems. How to wear your catheter Attach your catheter to your leg with tape (adhesive tape) or a leg strap. Make sure it is not too tight. If you use tape, remove any bits of tape that are already on the catheter. How to wear a drainage bag You should have:  A large overnight bag.  A small leg bag.  Overnight Bag You may wear the overnight bag at any time. Always keep the bag below the level of your bladder but off the floor. When you sleep, put a clean plastic bag in a wastebasket. Then hang the bag inside the wastebasket. Leg Bag Never wear the leg bag at night. Always wear the leg bag below your knee. Keep the leg bag secure with a leg strap or tape. How to care for your skin  Clean the skin around the catheter at least once every day.  Shower every day. Do not take baths.  Put creams, lotions, or ointments on your genital area only as told by your doctor.  Do not use powders, sprays, or lotions on your genital area. How to clean your catheter and your skin 1. Wash your hands with soap and water. 2. Wet a washcloth in warm water and gentle (mild) soap. 3. Use the washcloth to clean the skin where the catheter enters your body. Clean downward and wipe away from the catheter in small circles. Do not wipe toward the catheter. 4. Pat the area dry with a clean towel. Make sure to clean off all soap. How to care for your drainage bags Empty your drainage bag when it is ?- full or at least 2-3 times a day. Replace your drainage bag once a month or sooner if it starts to smell bad or look dirty. Do not clean your drainage bag unless told by your doctor. Emptying a drainage bag  Supplies Needed  Rubbing alcohol.  Gauze pad or cotton ball.  Tape or a leg strap.  Steps 1. Wash your hands with soap and water. 2. Separate (detach) the bag from your leg. 3. Hold the bag over  the toilet or a clean container. Keep the bag below your hips and bladder. This stops pee (urine) from going back into the tube. 4. Open the pour spout at the bottom of the bag. 5. Empty the pee into the toilet or container. Do not let the pour spout touch any surface. 6. Put rubbing alcohol on a gauze pad or cotton ball. 7. Use the gauze pad or cotton ball to clean the pour spout. 8. Close the pour spout. 9. Attach the bag to your leg with tape or a leg strap. 10. Wash your hands.  Changing a drainage bag Supplies Needed  Alcohol wipes.  A clean drainage bag.  Adhesive tape or a leg strap.  Steps 1. Wash your hands with soap and water. 2. Separate the dirty bag from your leg. 3. Pinch the rubber catheter with your fingers so that pee does not spill out. 4. Separate the catheter tube from the drainage tube where these tubes connect (at the connection valve). Do not let the tubes touch any surface. 5. Clean the end of the catheter tube with an alcohol wipe. Use a different alcohol wipe to clean the end of the drainage tube. 6. Connect the catheter tube to the drainage tube of the clean bag. 7. Attach the new bag to  the leg with adhesive tape or a leg strap. 8. Wash your hands.  How to prevent infection and other problems  Never pull on your catheter or try to remove it. Pulling can damage tissue in your body.  Always wash your hands before and after touching your catheter.  If a leg strap gets wet, replace it with a dry one.  Drink enough fluids to keep your pee clear or pale yellow, or as told by your doctor.  Do not let the drainage bag or tubing touch the floor.  Wear cotton underwear.  If you are male, wipe from front to back after you poop (have a bowel movement).  Check on the catheter often to make sure it works and the tubing is not twisted. Get help if:  Your pee is cloudy.  Your pee smells unusually bad.  Your pee is not draining into the bag.  Your  tube gets clogged.  Your catheter starts to leak.  Your bladder feels full. Get help right away if:  You have redness, swelling, or pain where the catheter enters your body.  You have fluid, pus, or a bad smell coming from the area where the catheter enters your body.  The area where the catheter enters your body feels warm.  You have a fever.  You have pain in your: ? Stomach (abdomen). ? Legs. ? Lower back. ? Bladder.  You see blood fill the catheter.  Your pee is pink or red.  You feel sick to your stomach (nauseous).  You throw up (vomit).  You have chills.  Your catheter gets pulled out. This information is not intended to replace advice given to you by your health care provider. Make sure you discuss any questions you have with your health care provider. Document Released: 06/03/2012 Document Revised: 01/05/2016 Document Reviewed: 07/22/2013 Elsevier Interactive Patient Education  2018 [place]   1) The drugs that you were given will stay in your system until tomorrow so for the next 24 hours you should not:  A) Drive an automobile B) Make any legal decisions C) Drink any alcoholic beverage   2) You may resume regular meals tomorrow.  Today it is better to start with liquids and gradually work up to solid foods.  You may eat anything you prefer, but it is better to start with liquids, then soup and crackers, and gradually work up to solid foods.   3) Please notify your doctor immediately if you have any unusual bleeding, trouble breathing, redness and pain at the surgery site, drainage, fever, or pain not relieved by medication. 4)   5) Your post-operative visit with Dr.                                     is: Date:                        Time:    Please call to schedule your post-operative visit.  6) Additional Instructions:

## 2017-02-07 NOTE — Op Note (Signed)
Date of procedure: 02/07/17  Preoperative diagnosis:  1. BPH with bladder outlet obstruction 2. Urinary retention  Postoperative diagnosis:  1. Same as above  Procedure: 1. Holmium laser enucleation of the prostate with morcellation  Surgeon: Hollice Espy, MD  Anesthesia: General  Complications: None  Intraoperative findings: Large greater than 6 cm prostate with trilobar coaptation.  Resection close to but not involving the left ureteral orifice.  Clear reflux of urine seen from both UOs at the end of the procedure.  Hemostasis adequate.  EBL: 200 cc  Specimens: Prostate chips  Drains: 20 French 2-way Foley catheter with 30 cc balloon  Indication: Tyler Morgan is a 71 y.o. patient with massive BPH with bladder outlet obstruction/urinary retention status post multiple failed voiding trials.  After reviewing the management options for treatment, he elected to proceed with the above surgical procedure(s). We have discussed the potential benefits and risks of the procedure, side effects of the proposed treatment, the likelihood of the patient achieving the goals of the procedure, and any potential problems that might occur during the procedure or recuperation. Informed consent has been obtained.  Description of procedure:  The patient was taken to the operating room and general anesthesia was induced.  The patient was placed in the dorsal lithotomy position, prepped and draped in the usual sterile fashion, and preoperative antibiotics were administered. A preoperative time-out was performed.   11 French resectoscope was advanced per urethra using a blunt angled operator which advanced easily.  At this point time, bleeding was noted from the friable prostatic mucosa.  The bladder was inspected and noted to be trabeculated without stones, lesions, or ulcerations.  The trigone was noted to be separate from the median lobe but in close proximity.  At this point time, 550 m laser  fiber was then brought in and using the settings of 1.9 J and 53 Hz, 2 incisions were created at the bladder neck, one at the 7:00 and one at the 5:00 positions which are carried down to meet in the midline just proximal to the verumontanum.  The median lobe adenoma was then enucleated in a caudal to cranial fashion rolling it towards the bladder neck and ultimately cleaving it off of the bladder neck.  This resection, the mucosa just adjacent to the left ureteral orifice was incised but the UO itself appeared to be intact.  There is clear reflux of urine from it.  There was some mild undermining of the left bladder neck.  The right bladder neck was intact.  Next, attention was turned to the enucleation of the left lateral lobe.  A curvilinear incision was created at the prostatic apex near the verumontanum again, with care taken to avoid resection beyond the verumontanum.  The incision was then carried out laterally and around encircling the left lateral lobe.  The anterior commissure mucosa was incised.  Again, the adenoma was separated from the underlying capsule in a caudal to cranial fashion rolling the adenoma towards the bladder neck and ultimately cleaving this.  A few small BPH nodules were enucleated pea-sized fashion.  Finally, the same exact procedure was performed on the right side.  On the side, the resection ended up being more in a pea-sized fashion enucleating large BPH nodules rather than the adenoma and block.  Ultimately, the prostatic fossa was noted to be widely patent with very little residual prostate tissue.  Hemostasis was adequate.  The ureters were reinspected and noted to have clear reflux of urine.  The resectoscope was then exchanged for the nephroscope and the Piranha morcellator was brought in.  The bladder was distended.  The prostate fragments were then morcellated and suctioned into the canister.  Once there were no obvious residual pieces in the bladder, the bladder was irrigated  several times without difficulty.  At this point time, the scope was removed.  A 20 French 2-way Foley catheter was placed.  30 cc of water was instilled into the balloon.  The catheter was placed on slight tension.  10 mg of IV Lasix was given to help with postoperative diuresis.  The patient was then clean and dry, repositioned in supine position, reversed from anesthesia, taken to PACU in stable condition.  There were no complications in this case.  Plan: Patient will will keep his catheter for 1 week.  He will voiding trial just after Christmas.  He will return to my clinic in 6 weeks to reassess his urinary symptoms.  He will continue Flomax and finasteride for the time being.  Hollice Espy, M.D.

## 2017-02-08 ENCOUNTER — Encounter: Payer: Self-pay | Admitting: Urology

## 2017-02-08 ENCOUNTER — Telehealth: Payer: Self-pay | Admitting: Urology

## 2017-02-08 NOTE — Telephone Encounter (Signed)
Pt had some questions about things that happened last night.  He had surgery at 64 yesterday.  Please give him a call back.  [phone]

## 2017-02-09 NOTE — Telephone Encounter (Signed)
LMOM

## 2017-02-14 ENCOUNTER — Ambulatory Visit (INDEPENDENT_AMBULATORY_CARE_PROVIDER_SITE_OTHER): Payer: Medicare Other

## 2017-02-14 VITALS — BP 119/75 | HR 77 | Ht 73.0 in | Wt 180.0 lb

## 2017-02-14 DIAGNOSIS — R339 Retention of urine, unspecified: Secondary | ICD-10-CM

## 2017-02-14 LAB — SURGICAL PATHOLOGY

## 2017-02-14 NOTE — Progress Notes (Signed)
Pt presents today for voiding trial. Pt did not want bladder to be filled and they foley removed. Therefore, pt foley was removed and pt was instructed to RTC today by 3pm for bladder scan. Pt voiced understanding.   Blood pressure 119/75, pulse 77, height 6\' 1"  (1.854 m), weight 180 lb (81.6 kg).

## 2017-02-15 NOTE — Telephone Encounter (Signed)
Pt was seen yesterday in clinic.

## 2017-02-21 DIAGNOSIS — M6281 Muscle weakness (generalized): Secondary | ICD-10-CM | POA: Diagnosis not present

## 2017-02-21 LAB — HM DIABETES FOOT EXAM: HM DIABETIC FOOT EXAM: POSITIVE

## 2017-03-27 ENCOUNTER — Ambulatory Visit (INDEPENDENT_AMBULATORY_CARE_PROVIDER_SITE_OTHER): Payer: Medicare Other | Admitting: Urology

## 2017-03-27 ENCOUNTER — Encounter: Payer: Self-pay | Admitting: Urology

## 2017-03-27 VITALS — BP 129/61 | HR 73 | Ht 73.0 in | Wt 176.0 lb

## 2017-03-27 DIAGNOSIS — R350 Frequency of micturition: Secondary | ICD-10-CM

## 2017-03-27 DIAGNOSIS — C61 Malignant neoplasm of prostate: Secondary | ICD-10-CM

## 2017-03-27 DIAGNOSIS — N400 Enlarged prostate without lower urinary tract symptoms: Secondary | ICD-10-CM | POA: Diagnosis not present

## 2017-03-27 DIAGNOSIS — N401 Enlarged prostate with lower urinary tract symptoms: Secondary | ICD-10-CM

## 2017-03-27 LAB — BLADDER SCAN AMB NON-IMAGING

## 2017-03-27 NOTE — Patient Instructions (Signed)
Kegel Exercises Kegel exercises help strengthen the muscles that support the rectum, vagina, small intestine, bladder, and uterus. Doing Kegel exercises can help:  Improve bladder and bowel control.  Improve sexual response.  Reduce problems and discomfort during pregnancy.  Kegel exercises involve squeezing your pelvic floor muscles, which are the same muscles you squeeze when you try to stop the flow of urine. The exercises can be done while sitting, standing, or lying down, but it is best to vary your position. Phase 1 exercises 1. Squeeze your pelvic floor muscles tight. You should feel a tight lift in your rectal area. If you are a male, you should also feel a tightness in your vaginal area. Keep your stomach, buttocks, and legs relaxed. 2. Hold the muscles tight for up to 10 seconds. 3. Relax your muscles. Repeat this exercise 50 times a day or as many times as told by your health care provider. Continue to do this exercise for at least 4-6 weeks or for as long as told by your health care provider. This information is not intended to replace advice given to you by your health care provider. Make sure you discuss any questions you have with your health care provider. Document Released: 01/24/2012 Document Revised: 10/02/2015 Document Reviewed: 12/27/2014 Elsevier Interactive Patient Education  2018 [employer]  

## 2017-03-27 NOTE — Progress Notes (Signed)
03/27/2017 5:33 PM   Tyler Morgan Mar 04, 1945 [id number]  Referring provider: Guadalupe Maple, MD [address] [place], [place] [address]  Chief Complaint  Patient presents with  . 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  He returns today for routine follow-up.  A total of 114 g were resected.  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  Today, he is very pleased with how he has been voiding.  After his catheter is removed, he has been voiding spontaneously without difficulty.  His stream is excellent.  He initially had urgency frequency which is improving particularly over the past week.  He does have a small amount of leakage which is unrelated to laughing, sneezing, and coughing which are relatively small in volume.  He is wearing a panty liner for safety.  He does have a personal history of elevated PSA.  He is undergone biopsy in the past which was negative (~10 years ago with Dr. Bernardo Heater, records not available).  Most recent PSA 8.5 on 10/11/16 down from 12.3 one year ago.    He also has a history of significant phimosis.  He is used creams with some success but is unable to completely retract his foreskin.  He may be interested in circumcision at the time of outlet procedure.  PVR today minimal.    Stopped taking Flomax 0.4 mg since surgery.    IPSS    Row Name 03/27/17 1500         International Prostate Symptom Score   How often have you had the sensation of not emptying your bladder?  Not at All     How often have you had to urinate less than every two hours?  About half the time     How often have you found you stopped and started again several times when you urinated?  Not at All     How often have you found it difficult to postpone  urination?  Less than 1 in 5 times     How often have you had a weak urinary stream?  Not at All     How often have you had to strain to start urination?  Not at All     How many times did you typically get up at night to urinate?  3 Times     Total IPSS Score  7       Quality of Life due to urinary symptoms   If you were to spend the rest of your life with your urinary condition just the way it is now how would you feel about that?  Pleased        Score:  1-7 Mild 8-19 Moderate 20-35 Severe    PMH: Past Medical History:  Diagnosis Date  . Diabetes mellitus without complication ([place])   . ED (erectile dysfunction)   . Hypertension   . Neuropathy   . Prostate enlargement   . Urinary retention     Surgical History: Past Surgical History:  Procedure Laterality Date  . CATARACT EXTRACTION Bilateral 1986, 2014  . EYE SURGERY Bilateral [id number]  . FRACTURE SURGERY Right    as a child/ right arm  . HERNIA REPAIR Right 01-13-14   inguinal hernia  . 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 N/A 02/07/2017  Procedure: 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;  Surgeon: Hollice Espy, MD;  Location: [facility] ORS;  Service: Urology;  Laterality: N/A;  . RETINAL DETACHMENT SURGERY  Jan 2015  . TONSILLECTOMY  1955    Home Medications:  Allergies as of 03/27/2017      Reactions   Acetaminophen Swelling, Other (See Comments)   Swelling of liver    Amlodipine Swelling      Medication List        Accurate as of 03/27/17 11:59 PM. Always use your most recent med list.          atenolol 50 MG tablet Commonly known as:  TENORMIN Take 1 tablet (50 mg total) by mouth daily.   benazepril 40 MG tablet Commonly known as:  LOTENSIN Take 1 tablet (40 mg total) by mouth daily.   docusate sodium 100 MG capsule Commonly known as:  COLACE Take 1 capsule (100 mg total) by mouth 2 (two) times daily.   empagliflozin 25 MG Tabs tablet Commonly known  as:  JARDIANCE Take 25 mg by mouth daily.   finasteride 5 MG tablet Commonly known as:  PROSCAR Take 1 tablet (5 mg total) by mouth daily.   hydrochlorothiazide 25 MG tablet Commonly known as:  HYDRODIURIL Take 1 tablet (25 mg total) by mouth daily.   ibuprofen 200 MG tablet Commonly known as:  ADVIL,MOTRIN Take 200 mg by mouth every 6 (six) hours as needed for headache or moderate pain.   metFORMIN 500 MG tablet Commonly known as:  GLUCOPHAGE Take 500 mg by mouth 2 (two) times daily with a meal.   miconazole 2 % cream Commonly known as:  MICOTIN Apply 1 application topically daily.   oxybutynin 5 MG tablet Commonly known as:  DITROPAN Take 1 tablet (5 mg total) by mouth every 8 (eight) hours as needed for bladder spasms.   sulfamethoxazole-trimethoprim 800-160 MG tablet Commonly known as:  BACTRIM DS,SEPTRA DS Take 1 tablet by mouth every 12 (twelve) hours.   TURMERIC PO Take 1 capsule by mouth daily.   Vitamin D-3 1000 units Caps Take 2,000 Units by mouth daily.       Allergies:  Allergies  Allergen Reactions  . Acetaminophen Swelling and Other (See Comments)    Swelling of liver   . Amlodipine Swelling    Family History: Family History  Problem Relation Age of Onset  . Colon cancer Father   . Angina Father   . Macular degeneration Father   . Alzheimer's disease Mother   . Prostate cancer Neg Hx   . Bladder Cancer Neg Hx   . Kidney cancer Neg Hx     Social History:  reports that he quit smoking about 33 years ago. His smoking use included cigarettes. He has a 13.00 pack-year smoking history. he has never used smokeless tobacco. He reports that he drinks about 1.8 oz of alcohol per week. He reports that he does not use drugs.  ROS: UROLOGY Frequent Urination?: No Hard to postpone urination?: No Burning/pain with urination?: No Get up at night to urinate?: Yes Leakage of urine?: Yes Urine stream starts and stops?: No Trouble starting stream?:  No Do you have to strain to urinate?: No Blood in urine?: No Urinary tract infection?: No Sexually transmitted disease?: No Injury to kidneys or bladder?: No Painful intercourse?: No Weak stream?: No Erection problems?: No Penile pain?: No  Gastrointestinal Nausea?: No Vomiting?: No Indigestion/heartburn?: No Diarrhea?: No Constipation?: No  Constitutional Fever: No Night sweats?: No  Weight loss?: No Fatigue?: No  Skin Skin rash/lesions?: No Itching?: No  Eyes Blurred vision?: No Double vision?: No  Ears/Nose/Throat Sore throat?: No Sinus problems?: No  Hematologic/Lymphatic Swollen glands?: No Easy bruising?: No  Cardiovascular Leg swelling?: No Chest pain?: No  Respiratory Cough?: No Shortness of breath?: No  Endocrine Excessive thirst?: No  Musculoskeletal Back pain?: No Joint pain?: No  Neurological Headaches?: No Dizziness?: No  Psychologic Depression?: No Anxiety?: No  Physical Exam: BP 129/61   Pulse 73   Ht 6\' 1"  (1.854 m)   Wt 176 lb (79.8 kg)   BMI 23.22 kg/m   Constitutional:  Alert and oriented, No acute distress. HEENT: [place] AT, moist mucus membranes.  Trachea midline, no masses. Cardiovascular: No clubbing, cyanosis, or edema. Respiratory: Normal respiratory effort, no increased work of breathing. Skin: No rashes, bruises or suspicious lesions. Neurologic: Grossly intact, no focal deficits, moving all 4 extremities. Psychiatric: Normal mood and affect.  Laboratory Data: Lab Results  Component Value Date   WBC 5.3 01/30/2017   HGB 14.9 01/30/2017   HCT 44.3 01/30/2017   MCV 85.8 01/30/2017   PLT 208 01/30/2017    Lab Results  Component Value Date   CREATININE 0.68 01/30/2017    Lab Results  Component Value Date   PSA1 8.5 (H) 10/11/2016   PSA1 12.3 (H) 07/21/2015    Lab Results  Component Value Date   HGBA1C 8.2 07/21/2015    Urinalysis N/a  Pertinent Imaging: Results for orders placed or performed  in visit on 03/27/17  BLADDER SCAN AMB NON-IMAGING  Result Value Ref Range   Scan Result 63ml     Assessment & Plan:    1. Benign prostatic hyperplasia with urinary frequency Overall, doing extremely well No longer in urine pleased with symptoms Small amount of incontinence which is improving, unclear from history whether this is stress or urge Encouraged Kegels Anticipate resolution with time - BLADDER SCAN AMB NON-IMAGING  2. Prostate cancer [facility]) Incidental Gleason 3+3 prostate cancer in surgical specimen, very low volume  The patient was counseled about the natural history of prostate cancer and the standard treatment options that are available for prostate cancer. It was explained to him how his age and life expectancy, clinical stage, Gleason score, and PSA affect his prognosis, the decision to proceed with additional staging studies, as well as how that information influences recommended treatment strategies. We discussed the roles for active surveillance, radiation therapy, surgical therapy, androgen deprivation, as well as ablative therapy options for the treatment of prostate cancer as appropriate to his individual cancer situation. We discussed the risks and benefits of these options with regard to their impact on cancer control and also in terms of potential adverse events, complications, and impact on quality of life particularly related to urinary, bowel, and sexual function. The patient was encouraged to ask questions throughout the discussion today and all questions were answered to his stated satisfaction.  Given his incidental low volume, very low risk disease, I would recommend surveillance at this time.  Plan for PSA in 6 months.  He is agreeable with this plan.   Return in about 6 months (around 09/24/2017) for PSA/ IPSS/ PVR.  Hollice Espy, MD  [facility] [address] [place], [place] [address] [phone]  I spent 25 min  with this patient of which greater than 50% was spent in counseling and coordination of care with the patient.

## 2017-04-02 ENCOUNTER — Encounter: Payer: Self-pay | Admitting: Family Medicine

## 2017-04-02 ENCOUNTER — Ambulatory Visit (INDEPENDENT_AMBULATORY_CARE_PROVIDER_SITE_OTHER): Payer: Medicare Other | Admitting: Family Medicine

## 2017-04-02 VITALS — BP 145/78 | HR 63 | Wt 185.0 lb

## 2017-04-02 DIAGNOSIS — I10 Essential (primary) hypertension: Secondary | ICD-10-CM | POA: Diagnosis not present

## 2017-04-02 DIAGNOSIS — E7800 Pure hypercholesterolemia, unspecified: Secondary | ICD-10-CM | POA: Diagnosis not present

## 2017-04-02 DIAGNOSIS — E119 Type 2 diabetes mellitus without complications: Secondary | ICD-10-CM | POA: Diagnosis not present

## 2017-04-02 DIAGNOSIS — E1169 Type 2 diabetes mellitus with other specified complication: Secondary | ICD-10-CM | POA: Insufficient documentation

## 2017-04-02 NOTE — Assessment & Plan Note (Signed)
Good control will hold Jardiance and observe blood sugars

## 2017-04-02 NOTE — Assessment & Plan Note (Signed)
Discussed hypercholesterol will hold on medication for now patient still close to surgery. Will do better with diet still elevated will need to start medications.

## 2017-04-02 NOTE — Progress Notes (Signed)
   BP (!) 145/78   Pulse 63   Wt 185 lb (83.9 kg)   SpO2 98%   BMI 24.41 kg/m    Subjective:    Patient ID: Tyler Morgan, male    DOB: 12/14/1945, 72 y.o.   MRN: [id number]  HPI: Tyler Morgan is a 72 y.o. male  Patient follow-up extensive surgery for BPH resulting diagnosis of prostate cancer is going to be observed. Patient is doing well with good control of diabetes.  Not taking any cholesterol medication blood pressure on home monitoring also doing very well. Patient's noted when taking hydrochlorothiazide has some urinary incontinence so has not been taking that.  Reviewed will observe blood pressure if needs to take medication because of blood pressure elevation will start with half a tablet. Relevant past medical, surgical, family and social history reviewed and updated as indicated. Interim medical history since our last visit reviewed. Allergies and medications reviewed and updated.  Review of Systems  Constitutional: Negative.   Respiratory: Negative.   Cardiovascular: Negative.     Per HPI unless specifically indicated above     Objective:    BP (!) 145/78   Pulse 63   Wt 185 lb (83.9 kg)   SpO2 98%   BMI 24.41 kg/m   Wt Readings from Last 3 Encounters:  04/02/17 185 lb (83.9 kg)  03/27/17 176 lb (79.8 kg)  02/14/17 180 lb (81.6 kg)    Physical Exam  Constitutional: He is oriented to person, place, and time. He appears well-developed and well-nourished.  HENT:  Head: Normocephalic and atraumatic.  Eyes: Conjunctivae and EOM are normal.  Neck: Normal range of motion.  Cardiovascular: Normal rate, regular rhythm and normal heart sounds.  Pulmonary/Chest: Effort normal and breath sounds normal.  Musculoskeletal: Normal range of motion.  Neurological: He is alert and oriented to person, place, and time.  Skin: No erythema.  Psychiatric: He has a normal mood and affect. His behavior is normal. Judgment and thought content normal.    Results for  orders placed or performed in visit on 03/27/17  BLADDER SCAN AMB NON-IMAGING  Result Value Ref Range   Scan Result 43ml       Assessment & Plan:   Problem List Items Addressed This Visit      Cardiovascular and Mediastinum   Hypertension - Primary    Stable for now will observe may need hydrochlorothiazide will start at 12.5 mg      Relevant Orders   Basic metabolic panel   Bayer DCA Hb A1c Waived   LP+ALT+AST Piccolo, [place]     Endocrine   DM type 2 (diabetes mellitus, type 2) ([place])    Good control will hold Jardiance and observe blood sugars      Relevant Orders   Basic metabolic panel   Bayer DCA Hb A1c Waived   LP+ALT+AST Piccolo, Waived     Other   Hypercholesterolemia    Discussed hypercholesterol will hold on medication for now patient still close to surgery. Will do better with diet still elevated will need to start medications.          Follow up plan: Return in about 3 months (around 06/30/2017) for Hemoglobin A1c, BMP,  Lipids, ALT, AST.

## 2017-04-02 NOTE — Assessment & Plan Note (Signed)
Stable for now will observe may need hydrochlorothiazide will start at 12.5 mg

## 2017-04-03 ENCOUNTER — Encounter: Payer: Self-pay | Admitting: Family Medicine

## 2017-04-03 LAB — LP+ALT+AST PICCOLO, WAIVED
ALT (SGPT) PICCOLO, WAIVED: 30 U/L (ref 10–47)
AST (SGOT) Piccolo, Waived: 20 U/L (ref 11–38)
CHOL/HDL RATIO PICCOLO,WAIVE: 4.1 mg/dL
Cholesterol Piccolo, Waived: 227 mg/dL — ABNORMAL HIGH (ref <200)
HDL Chol Piccolo, Waived: 55 mg/dL — ABNORMAL LOW (ref >59)
LDL Chol Calc Piccolo Waived: 131 mg/dL — ABNORMAL HIGH (ref <100)
TRIGLYCERIDES PICCOLO,WAIVED: 206 mg/dL — AB (ref <150)
VLDL CHOL CALC PICCOLO,WAIVE: 41 mg/dL — AB (ref <30)

## 2017-04-03 LAB — BASIC METABOLIC PANEL
BUN/Creatinine Ratio: 30 — ABNORMAL HIGH (ref 10–24)
BUN: 20 mg/dL (ref 8–27)
CO2: 23 mmol/L (ref 20–29)
Calcium: 9.1 mg/dL (ref 8.6–10.2)
Chloride: 108 mmol/L — ABNORMAL HIGH (ref 96–106)
Creatinine, Ser: 0.67 mg/dL — ABNORMAL LOW (ref 0.76–1.27)
GFR, EST AFRICAN AMERICAN: 112 mL/min/{1.73_m2} (ref >59)
GFR, EST NON AFRICAN AMERICAN: 97 mL/min/{1.73_m2} (ref >59)
Glucose: 120 mg/dL — ABNORMAL HIGH (ref 65–99)
POTASSIUM: 4.1 mmol/L (ref 3.5–5.2)
SODIUM: 145 mmol/L — AB (ref 134–144)

## 2017-04-03 LAB — BAYER DCA HB A1C WAIVED: HB A1C (BAYER DCA - WAIVED): 5.7 % (ref <7.0)

## 2017-05-01 ENCOUNTER — Other Ambulatory Visit: Payer: Self-pay | Admitting: Family Medicine

## 2017-05-10 ENCOUNTER — Ambulatory Visit: Payer: Medicare Other

## 2017-06-28 ENCOUNTER — Telehealth: Payer: Self-pay | Admitting: Family Medicine

## 2017-06-28 NOTE — Telephone Encounter (Signed)
Routing to provider. Please advise.

## 2017-06-28 NOTE — Telephone Encounter (Signed)
Called [facility] back and let Arbie Cookey know what Dr. Jeananne Rama said.

## 2017-06-28 NOTE — Telephone Encounter (Signed)
Copied from [place] [id number]. Topic: General - Other >> Jun 28, 2017  3:05 PM Margot Ables wrote: Reason for CRM: Arbie Cookey is asking if pt should still be taking Jardiance. Pt has not had it filled since 02/2017. She states patient used to be on a statin drug and is asking if he needs to be on one or if it has been discontinued. Please call to advise update to medications.

## 2017-06-28 NOTE — Telephone Encounter (Signed)
Call pt Was held at last appointment. Will review at next appointment coming up later this month.

## 2017-07-11 DIAGNOSIS — D225 Melanocytic nevi of trunk: Secondary | ICD-10-CM | POA: Diagnosis not present

## 2017-07-11 DIAGNOSIS — C44311 Basal cell carcinoma of skin of nose: Secondary | ICD-10-CM | POA: Diagnosis not present

## 2017-07-11 DIAGNOSIS — D1800 Hemangioma unspecified site: Secondary | ICD-10-CM | POA: Diagnosis not present

## 2017-07-11 DIAGNOSIS — Z1283 Encounter for screening for malignant neoplasm of skin: Secondary | ICD-10-CM | POA: Diagnosis not present

## 2017-07-11 DIAGNOSIS — B360 Pityriasis versicolor: Secondary | ICD-10-CM | POA: Diagnosis not present

## 2017-07-17 ENCOUNTER — Ambulatory Visit: Payer: Medicare Other | Admitting: Family Medicine

## 2017-07-30 ENCOUNTER — Encounter: Payer: Self-pay | Admitting: Family Medicine

## 2017-07-30 ENCOUNTER — Ambulatory Visit: Payer: Medicare Other | Admitting: Family Medicine

## 2017-07-30 VITALS — BP 118/69 | HR 71 | Ht 73.0 in | Wt 196.0 lb

## 2017-07-30 DIAGNOSIS — E7800 Pure hypercholesterolemia, unspecified: Secondary | ICD-10-CM

## 2017-07-30 DIAGNOSIS — E1100 Type 2 diabetes mellitus with hyperosmolarity without nonketotic hyperglycemic-hyperosmolar coma (NKHHC): Secondary | ICD-10-CM

## 2017-07-30 DIAGNOSIS — I10 Essential (primary) hypertension: Secondary | ICD-10-CM

## 2017-07-30 DIAGNOSIS — IMO0002 Reserved for concepts with insufficient information to code with codable children: Secondary | ICD-10-CM

## 2017-07-30 DIAGNOSIS — E1349 Other specified diabetes mellitus with other diabetic neurological complication: Secondary | ICD-10-CM

## 2017-07-30 DIAGNOSIS — E1365 Other specified diabetes mellitus with hyperglycemia: Secondary | ICD-10-CM | POA: Diagnosis not present

## 2017-07-30 LAB — LP+ALT+AST PICCOLO, WAIVED
ALT (SGPT) Piccolo, Waived: 35 U/L (ref 10–47)
AST (SGOT) PICCOLO, WAIVED: 29 U/L (ref 11–38)
CHOL/HDL RATIO PICCOLO,WAIVE: 4.5 mg/dL
Cholesterol Piccolo, Waived: 193 mg/dL (ref <200)
HDL Chol Piccolo, Waived: 43 mg/dL — ABNORMAL LOW (ref >59)
LDL CHOL CALC PICCOLO WAIVED: 101 mg/dL — AB (ref <100)
Triglycerides Piccolo,Waived: 245 mg/dL — ABNORMAL HIGH (ref <150)
VLDL Chol Calc Piccolo,Waive: 49 mg/dL — ABNORMAL HIGH (ref <30)

## 2017-07-30 LAB — BAYER DCA HB A1C WAIVED: HB A1C (BAYER DCA - WAIVED): 7.2 % — ABNORMAL HIGH (ref <7.0)

## 2017-07-30 MED ORDER — ATORVASTATIN CALCIUM 20 MG PO TABS: 20.0000 mg | ORAL_TABLET | Freq: Every day | ORAL | 3 refills | Status: DC

## 2017-07-30 NOTE — Assessment & Plan Note (Signed)
Discussed hypercholesterol and starting medications.  Will start atorvastatin lower dose 20 mg.

## 2017-07-30 NOTE — Progress Notes (Signed)
BP 118/69   Pulse 71   Ht 6\' 1"  (1.854 m)   Wt 196 lb (88.9 kg)   SpO2 97%   BMI 25.86 kg/m    Subjective:    Patient ID: Tyler Morgan, male    DOB: March 24, 1945, 72 y.o.   MRN: [id number]  HPI: ANDRIK SANDT is a 72 y.o. male  Chief Complaint  Patient presents with  . Follow-up  . Hyperlipidemia  . Hypertension  . Diabetes  Patient follow-up no complaints from medications taking metformin 500 twice a day no low blood sugar spells or issues. Is complaining about weakness of legs seem to be getting a little worse not doing leg strengthening exercises. Blood pressure cholesterol no complaints from medications and good control.  Relevant past medical, surgical, family and social history reviewed and updated as indicated. Interim medical history since our last visit reviewed. Allergies and medications reviewed and updated.  Review of Systems  Constitutional: Negative.   Respiratory: Negative.   Cardiovascular: Negative.     Per HPI unless specifically indicated above     Objective:    BP 118/69   Pulse 71   Ht 6\' 1"  (1.854 m)   Wt 196 lb (88.9 kg)   SpO2 97%   BMI 25.86 kg/m   Wt Readings from Last 3 Encounters:  07/30/17 196 lb (88.9 kg)  04/02/17 185 lb (83.9 kg)  03/27/17 176 lb (79.8 kg)    Physical Exam  Constitutional: He is oriented to person, place, and time. He appears well-developed and well-nourished.  HENT:  Head: Normocephalic and atraumatic.  Eyes: Conjunctivae and EOM are normal.  Neck: Normal range of motion.  Cardiovascular: Normal rate, regular rhythm and normal heart sounds.  Pulmonary/Chest: Effort normal and breath sounds normal.  Musculoskeletal: Normal range of motion.  Neurological: He is alert and oriented to person, place, and time.  Skin: No erythema.  Psychiatric: He has a normal mood and affect. His behavior is normal. Judgment and thought content normal.    Results for orders placed or performed in visit on  16/07/37  Basic metabolic panel  Result Value Ref Range   Glucose 120 (H) 65 - 99 mg/dL   BUN 20 8 - 27 mg/dL   Creatinine, Ser 0.67 (L) 0.76 - 1.27 mg/dL   GFR calc non Af Amer 97 >59 mL/min/1.73   GFR calc Af Amer 112 >59 mL/min/1.73   BUN/Creatinine Ratio 30 (H) 10 - 24   Sodium 145 (H) 134 - 144 mmol/L   Potassium 4.1 3.5 - 5.2 mmol/L   Chloride 108 (H) 96 - 106 mmol/L   CO2 23 20 - 29 mmol/L   Calcium 9.1 8.6 - 10.2 mg/dL  Bayer DCA Hb A1c Waived  Result Value Ref Range   HB A1C (BAYER DCA - WAIVED) 5.7 <7.0 %  LP+ALT+AST Piccolo, Waived  Result Value Ref Range   ALT (SGPT) Piccolo, Waived 30 10 - 47 U/L   AST (SGOT) Piccolo, Waived 20 11 - 38 U/L   Cholesterol Piccolo, Waived 227 (H) <200 mg/dL   HDL Chol Piccolo, Waived 55 (L) >59 mg/dL   Triglycerides Piccolo,Waived 206 (H) <150 mg/dL   Chol/HDL Ratio Piccolo,Waive 4.1 mg/dL   LDL Chol Calc Piccolo Waived 131 (H) <100 mg/dL   VLDL Chol Calc Piccolo,Waive 41 (H) <30 mg/dL      Assessment & Plan:   Problem List Items Addressed This Visit      Cardiovascular and Mediastinum   Hypertension -  Primary   Relevant Medications   atorvastatin (LIPITOR) 20 MG tablet   Other Relevant Orders   Bayer DCA Hb A1c Waived   LP+ALT+AST Piccolo, Waived   Basic metabolic panel     Endocrine   DM (diabetes mellitus), secondary, uncontrolled, w/neurologic complic [facility])    Reviewed neurology notes and diabetic peripheral neuropathy. Discussed leg strengthening and exercises. Discussed diabetes poor control hemoglobin A1c of 7.2 today. Discussed above 7 worsening neuropathy. Discuss as blood sugars are higher in the morning taking 2 metformin at night and 1 in the morning.      Relevant Medications   atorvastatin (LIPITOR) 20 MG tablet     Other   Hypercholesteremia    Discussed hypercholesterol and starting medications.  Will start atorvastatin lower dose 20 mg.      Relevant Medications   atorvastatin (LIPITOR) 20 MG  tablet       Follow up plan: Return in about 3 months (around 10/30/2017) for Physical Exam, Hemoglobin A1c.

## 2017-07-30 NOTE — Assessment & Plan Note (Signed)
Reviewed neurology notes and diabetic peripheral neuropathy. Discussed leg strengthening and exercises. Discussed diabetes poor control hemoglobin A1c of 7.2 today. Discussed above 7 worsening neuropathy. Discuss as blood sugars are higher in the morning taking 2 metformin at night and 1 in the morning.

## 2017-07-31 ENCOUNTER — Encounter: Payer: Self-pay | Admitting: Family Medicine

## 2017-07-31 LAB — BASIC METABOLIC PANEL
BUN / CREAT RATIO: 23 (ref 10–24)
BUN: 19 mg/dL (ref 8–27)
CHLORIDE: 102 mmol/L (ref 96–106)
CO2: 21 mmol/L (ref 20–29)
Calcium: 9.5 mg/dL (ref 8.6–10.2)
Creatinine, Ser: 0.81 mg/dL (ref 0.76–1.27)
GFR calc Af Amer: 103 mL/min/{1.73_m2} (ref >59)
GFR calc non Af Amer: 89 mL/min/{1.73_m2} (ref >59)
GLUCOSE: 174 mg/dL — AB (ref 65–99)
Potassium: 3.8 mmol/L (ref 3.5–5.2)
SODIUM: 141 mmol/L (ref 134–144)

## 2017-08-22 ENCOUNTER — Encounter: Payer: Self-pay | Admitting: Family Medicine

## 2017-09-19 ENCOUNTER — Ambulatory Visit (INDEPENDENT_AMBULATORY_CARE_PROVIDER_SITE_OTHER): Payer: Medicare Other

## 2017-09-19 ENCOUNTER — Other Ambulatory Visit: Payer: Medicare Other

## 2017-09-19 VITALS — BP 128/74 | HR 60 | Temp 97.8°F | Resp 16 | Ht 72.0 in | Wt 195.8 lb

## 2017-09-19 DIAGNOSIS — Z0000 Encounter for general adult medical examination without abnormal findings: Secondary | ICD-10-CM | POA: Diagnosis not present

## 2017-09-19 DIAGNOSIS — C61 Malignant neoplasm of prostate: Secondary | ICD-10-CM

## 2017-09-19 NOTE — Progress Notes (Signed)
Subjective:   Tyler Morgan is a 72 y.o. male who presents for Medicare Annual/Subsequent preventive examination.  Review of Systems:   Cardiac Risk Factors include: diabetes mellitus;advanced age (>15men, >75 women);hypertension;dyslipidemia;male gender     Objective:    Vitals: BP 128/74 (BP Location: Left Arm, Patient Position: Sitting)   Pulse 60   Temp 97.8 F (36.6 C) (Temporal)   Resp 16   Ht 6' (1.829 m)   Wt 195 lb 12.8 oz (88.8 kg)   BMI 26.56 kg/m   Body mass index is 26.56 kg/m.  Advanced Directives 09/19/2017 02/07/2017 01/30/2017 12/23/2016 12/21/2016 11/26/2016 11/25/2016  Does Patient Have a Medical Advance Directive? No No No No No No No  Would patient like information on creating a medical advance directive? Yes (MAU/Ambulatory/Procedural Areas - Information given) No - Patient declined - - - - -    Tobacco Social History   Tobacco Use  Smoking Status Former Smoker  . Packs/day: 1.00  . Years: 13.00  . Pack years: 13.00  . Types: Cigarettes  . Last attempt to quit: 02/21/1984  . Years since quitting: 33.6  Smokeless Tobacco Never Used     Counseling given: Not Answered   Clinical Intake:  Pre-visit preparation completed: Yes  Pain : No/denies pain Pain Score: 0-No pain     Nutritional Status: BMI 25 -29 Overweight Nutritional Risks: None Diabetes: Yes CBG done?: No Did pt. bring in CBG monitor from home?: No  How often do you need to have someone help you when you read instructions, pamphlets, or other written materials from your doctor or pharmacy?: 1 - Never What is the last grade level you completed in school?: bachelors   Interpreter Needed?: No  Information entered by :: Etty Isaac,LPN   Past Medical History:  Diagnosis Date  . Diabetes mellitus without complication ([place])   . ED (erectile dysfunction)   . Hypertension   . Neuropathy   . Prostate enlargement   . Urinary retention    Past Surgical History:  Procedure  Laterality Date  . BASAL CELL CARCINOMA EXCISION     nose   . CATARACT EXTRACTION Bilateral 1986, 2014  . EYE SURGERY Bilateral [id number]  . FRACTURE SURGERY Right    as a child/ right arm  . HERNIA REPAIR Right 01-13-14   inguinal hernia  . 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 RETINAL DETACHMENT SURGERY  Jan 2015  . TONSILLECTOMY  1955   Family History  Problem Relation Age of Onset  . Colon cancer Father   . Angina Father   . Macular degeneration Father   . Alzheimer's disease Mother   . Prostate cancer Neg Hx   . Bladder Cancer Neg Hx   . Kidney cancer Neg Hx    Social History   Socioeconomic History  . Marital status: Married    Spouse name: Not on file  . Number of children: Not on file  . Years of education: Not on file  . Highest education level: Bachelor's degree (e.g., BA, AB, BS)  Occupational History  . Not on file  Social Needs  . Financial resource strain: Not hard at all  . Food insecurity:    Worry: Never true    Inability: Never true  . Transportation needs:    Medical: No  Non-medical: No  Tobacco Use  . Smoking status: Former Smoker    Packs/day: 1.00    Years: 13.00    Pack years: 13.00    Types: Cigarettes    Last attempt to quit: 02/21/1984    Years since quitting: 33.6  . Smokeless tobacco: Never Used  Substance and Sexual Activity  . Alcohol use: Yes    Alcohol/week: 1.2 oz    Types: 2 Cans of beer per week    Comment: wine occasionally   . Drug use: No  . Sexual activity: Not on file  Lifestyle  . Physical activity:    Days per week: 0 days    Minutes per session: 0 min  . Stress: Not at all  Relationships  . Social connections:    Talks on phone: More than three times a week    Gets together: More than three times a week     Attends religious service: Never    Active member of club or organization: Yes    Attends meetings of clubs or organizations: More than 4 times per year    Relationship status: Married  Other Topics Concern  . Not on file  Social History Narrative   Working fulltime     Outpatient Encounter Medications as of 09/19/2017  Medication Sig  . atenolol (TENORMIN) 50 MG tablet TAKE ONE TABLET EVERY DAY  . atorvastatin (LIPITOR) 20 MG tablet Take 1 tablet (20 mg total) by mouth daily.  . benazepril (LOTENSIN) 40 MG tablet Take 1 tablet (40 mg total) by mouth daily.  . Cholecalciferol (VITAMIN D-3) 1000 units CAPS Take 2,000 Units by mouth daily.   . finasteride (PROSCAR) 5 MG tablet Take 1 tablet (5 mg total) by mouth daily.  . hydrochlorothiazide (HYDRODIURIL) 25 MG tablet Take 1 tablet (25 mg total) by mouth daily.  . metFORMIN (GLUCOPHAGE) 500 MG tablet Take 500 mg by mouth 2 (two) times daily with a meal.  . TURMERIC PO Take 1 capsule by mouth daily.   Marland Kitchen ibuprofen (ADVIL,MOTRIN) 200 MG tablet Take 200 mg by mouth every 6 (six) hours as needed for headache or moderate pain.  . miconazole (MICOTIN) 2 % cream Apply 1 application topically daily.   No facility-administered encounter medications on file as of 09/19/2017.     Activities of Daily Living In your present state of health, do you have any difficulty performing the following activities: 09/19/2017 01/30/2017  Hearing? N N  Vision? N N  Difficulty concentrating or making decisions? N N  Walking or climbing stairs? Y Y  Comment neuropathy  -  Dressing or bathing? N N  Doing errands, shopping? N N  Preparing Food and eating ? N -  Using the Toilet? N -  In the past six months, have you accidently leaked urine? Y -  Comment wears depends for protection  -  Do you have problems with loss of bowel control? N -  Managing your Medications? N -  Managing your Finances? N -  Housekeeping or managing your Housekeeping? N -  Some recent  data might be hidden    Patient Care Team: Guadalupe Maple, MD as PCP - General (Family Medicine) Guadalupe Maple, MD (Family Medicine) Christene Lye, MD (General Surgery) Schnier, Dolores Lory, MD (Vascular Surgery)   Assessment:   This is a routine wellness examination for Tyler Morgan.  Exercise Activities and Dietary recommendations Current Exercise Habits: The patient does not participate in regular exercise at present, Exercise limited by:  None identified  Goals    . DIET - INCREASE WATER INTAKE     Recommend drinking at least 6-8 glasses of water a day        Fall Risk Fall Risk  09/19/2017 07/30/2017 07/30/2017 01/26/2017 09/14/2016  Falls in the past year? Yes No No No Yes  Number falls in past yr: 1 - - - 2 or more  Injury with Fall? No - - - Yes  Follow up - - - - Falls prevention discussed   Is the patient's home free of loose throw rugs in walkways, pet beds, electrical cords, etc?   yes      Grab bars in the bathroom? yes      Handrails on the stairs?   yes       Adequate lighting?   yes  Timed Get Up and Go Performed: Completed in 8 seconds with no use of assistive devices, steady gait. No intervention needed at this time.   Depression Screen PHQ 2/9 Scores 09/19/2017 07/30/2017 01/26/2017 09/14/2016  PHQ - 2 Score 0 0 0 0    Cognitive Function     6CIT Screen 09/19/2017 09/14/2016  What Year? 0 points 0 points  What month? 0 points 0 points  What time? 0 points 0 points  Count back from 20 0 points 0 points  Months in reverse 0 points 0 points  Repeat phrase 0 points 2 points  Total Score 0 2    Immunization History  Administered Date(s) Administered  . Influenza, High Dose Seasonal PF 11/09/2015, 12/15/2016  . Influenza,inj,Quad PF,6+ Mos 12/15/2014  . Influenza-Unspecified 12/19/2016  . Pneumococcal Conjugate-13 05/13/2014  . Pneumococcal Polysaccharide-23 07/21/2015  . Pneumococcal-Unspecified 01/09/2008  . Tdap 09/02/2009  . Zoster 01/09/2008      Qualifies for Shingles Vaccine? Yes, discussed shingrix vaccine   Screening Tests Health Maintenance  Topic Date Due  . OPHTHALMOLOGY EXAM  08/26/2016  . INFLUENZA VACCINE  09/20/2017  . HEMOGLOBIN A1C  01/29/2018  . FOOT EXAM  02/21/2018  . TETANUS/TDAP  09/03/2019  . COLONOSCOPY  03/13/2021  . Hepatitis C Screening  Completed  . PNA vac Low Risk Adult  Completed   Cancer Screenings: Lung: Low Dose CT Chest recommended if Age 43-80 years, 30 pack-year currently smoking OR have quit w/in 15years. Patient does not qualify. Colorectal: completed 03/14/2011  Additional Screenings:  Hepatitis C Screening: completed 07/21/2015      Plan:    I have personally reviewed and addressed the Medicare Annual Wellness questionnaire and have noted the following in the patient's chart:  A. Medical and social history B. Use of alcohol, tobacco or illicit drugs  C. Current medications and supplements D. Functional ability and status E.  Nutritional status F.  Physical activity G. Advance directives H. List of other physicians I.  Hospitalizations, surgeries, and ER visits in previous 12 months J.  [place] such as hearing and vision if needed, cognitive and depression L. Referrals and appointments   In addition, I have reviewed and discussed with patient certain preventive protocols, quality metrics, and best practice recommendations. A written personalized care plan for preventive services as well as general preventive health recommendations were provided to patient.   Signed,  Tyler Aas, LPN Nurse Health Advisor   Nurse Notes: Discussed diabetic eye exam- goes to [facility], he will call and schedule an appt and have results faxed when completed

## 2017-09-19 NOTE — Patient Instructions (Addendum)
Tyler Morgan , Thank you for taking time to come for yourMedicare Wellness Visit. I appreciate your ongoing commitment to your health goals. Please review the following plan we discussed and let me know if I can assist you in the future.   Screening recommendations/referrals: Colonoscopy: Completed 03/14/2011 Recommended yearly ophthalmology/optometry visit for glaucoma screening and checkup Recommended yearly dental visit for hygiene and checkup  Vaccinations: Influenza vaccine: up to date, due 10/2017 Pneumococcal vaccine: up to date Tdap vaccine: up to date Shingles vaccine: shingrix eligible, check with your insurance company for coverage   Advanced directives: Advance directive discussed with you today. I have provided a copy for you to complete at home and have notarized. Once this is complete please bring a copy in to our office so we can scan it into your chart.  Conditions/risks identified: Recommend drinking at least 6-8 glasses of water a day   Next appointment: Follow up in one year for your annual wellness exam.   Preventive Care 65 Years and Older, Male Preventive care refers to lifestyle choices and visits with your health care provider that can promote health and wellness. What does preventive care include?  A yearly physical exam. This is also called an annual well check.  Dental exams once or twice a year.  Routine eye exams. Ask your health care provider how often you should have your eyes checked.  Personal lifestyle choices, including:  Daily care of your teeth and gums.  Regular physical activity.  Eating a healthy diet.  Avoiding tobacco and drug use.  Limiting alcohol use.  Practicing safe sex.  Taking low doses of aspirin every day.  Taking vitamin and mineral supplements as recommended by your health care provider. What happens during an annual well check? The services and screenings done by your health care provider during your annual  well check will depend on your age, overall health, lifestyle risk factors, and family history of disease. Counseling  Your health care provider may ask you questions about your:  Alcohol use.  Tobacco use.  Drug use.  Emotional well-being.  Home and relationship well-being.  Sexual activity.  Eating habits.  History of falls.  Memory and ability to understand (cognition).  Work and work Statistician. Screening  You may have the following tests or measurements:  Height, weight, and BMI.  Blood pressure.  Lipid and cholesterol levels. These may be checked every 5 years, or more frequently if you are over 89 years old.  Skin check.  Lung cancer screening. You may have this screening every year starting at age 65 if you have a 30-pack-year history of smoking and currently smoke or have quit within the past 15 years.  Fecal occult blood test (FOBT) of the stool. You may have this test every year starting at age 40.  Flexible sigmoidoscopy or colonoscopy. You may have a sigmoidoscopy every 5 years or a colonoscopy every 10 years starting at age 63.  Prostate cancer screening. Recommendations will vary depending on your family history and other risks.  Hepatitis C blood test.  Hepatitis B blood test.  Sexually transmitted disease (STD) testing.  Diabetes screening. This is done by checking your blood sugar (glucose) after you have not eaten for a while (fasting). You may have this done every 1-3 years.  Abdominal aortic aneurysm (AAA) screening. You may need this if you are a current or former smoker.  Osteoporosis. You may be screened starting at age 14 if you are at high risk. Talk with  your health care provider about your test results, treatment options, and if necessary, the need for more tests. Vaccines  Your health care provider may recommend certain vaccines, such as:  Influenza vaccine. This is recommended every year.  Tetanus, diphtheria, and acellular  pertussis (Tdap, Td) vaccine. You may need a Td booster every 10 years.  Zoster vaccine. You may need this after age 54.  Pneumococcal 13-valent conjugate (PCV13) vaccine. One dose is recommended after age 7.  Pneumococcal polysaccharide (PPSV23) vaccine. One dose is recommended after age 34. Talk to your health care provider about which screenings and vaccines you need and how often you need them. This information is not intended to replace advice given to you by your health care provider. Make sure you discuss any questions you have with your health care provider. Document Released: 03/05/2015 Document Revised: 10/27/2015 Document Reviewed: 12/08/2014 Elsevier Interactive Patient Education  2017 [place] Prevention in the Home Falls can cause injuries. They can happen to people of all ages. There are many things you can do to make your home safe and to help prevent falls. What can I do on the outside of my home?  Regularly fix the edges of walkways and driveways and fix any cracks.  Remove anything that might make you trip as you walk through a door, such as a raised step or threshold.  Trim any bushes or trees on the path to your home.  Use bright outdoor lighting.  Clear any walking paths of anything that might make someone trip, such as rocks or tools.  Regularly check to see if handrails are loose or broken. Make sure that both sides of any steps have handrails.  Any raised decks and porches should have guardrails on the edges.  Have any leaves, snow, or ice cleared regularly.  Use sand or salt on walking paths during winter.  Clean up any spills in your garage right away. This includes oil or grease spills. What can I do in the bathroom?  Use night lights.  Install grab bars by the toilet and in the tub and shower. Do not use towel bars as grab bars.  Use non-skid mats or decals in the tub or shower.  If you need to sit down in the shower, use a plastic,  non-slip stool.  Keep the floor dry. Clean up any water that spills on the floor as soon as it happens.  Remove soap buildup in the tub or shower regularly.  Attach bath mats securely with double-sided non-slip rug tape.  Do not have throw rugs and other things on the floor that can make you trip. What can I do in the bedroom?  Use night lights.  Make sure that you have a light by your bed that is easy to reach.  Do not use any sheets or blankets that are too big for your bed. They should not hang down onto the floor.  Have a firm chair that has side arms. You can use this for support while you get dressed.  Do not have throw rugs and other things on the floor that can make you trip. What can I do in the kitchen?  Clean up any spills right away.  Avoid walking on wet floors.  Keep items that you use a lot in easy-to-reach places.  If you need to reach something above you, use a strong step stool that has a grab bar.  Keep electrical cords out of the way.  Do not  use floor polish or wax that makes floors slippery. If you must use wax, use non-skid floor wax.  Do not have throw rugs and other things on the floor that can make you trip. What can I do with my stairs?  Do not leave any items on the stairs.  Make sure that there are handrails on both sides of the stairs and use them. Fix handrails that are broken or loose. Make sure that handrails are as long as the stairways.  Check any carpeting to make sure that it is firmly attached to the stairs. Fix any carpet that is loose or worn.  Avoid having throw rugs at the top or bottom of the stairs. If you do have throw rugs, attach them to the floor with carpet tape.  Make sure that you have a light switch at the top of the stairs and the bottom of the stairs. If you do not have them, ask someone to add them for you. What else can I do to help prevent falls?  Wear shoes that:  Do not have high heels.  Have rubber  bottoms.  Are comfortable and fit you well.  Are closed at the toe. Do not wear sandals.  If you use a stepladder:  Make sure that it is fully opened. Do not climb a closed stepladder.  Make sure that both sides of the stepladder are locked into place.  Ask someone to hold it for you, if possible.  Clearly mark and make sure that you can see:  Any grab bars or handrails.  First and last steps.  Where the edge of each step is.  Use tools that help you move around (mobility aids) if they are needed. These include:  Canes.  Walkers.  Scooters.  Crutches.  Turn on the lights when you go into a dark area. Replace any light bulbs as soon as they burn out.  Set up your furniture so you have a clear path. Avoid moving your furniture around.  If any of your floors are uneven, fix them.  If there are any pets around you, be aware of where they are.  Review your medicines with your doctor. Some medicines can make you feel dizzy. This can increase your chance of falling. Ask your doctor what other things that you can do to help prevent falls. This information is not intended to replace advice given to you by your health care provider. Make sure you discuss any questions you have with your health care provider. Document Released: 12/03/2008 Document Revised: 07/15/2015 Document Reviewed: 03/13/2014 Elsevier Interactive Patient Education  2017 [employer].

## 2017-09-20 LAB — PSA: Prostate Specific Ag, Serum: 0.9 ng/mL (ref 0.0–4.0)

## 2017-09-25 ENCOUNTER — Encounter: Payer: Self-pay | Admitting: Urology

## 2017-09-25 ENCOUNTER — Ambulatory Visit: Payer: Medicare Other | Admitting: Urology

## 2017-09-25 VITALS — BP 148/77 | HR 76 | Ht 73.0 in | Wt 190.0 lb

## 2017-09-25 DIAGNOSIS — R350 Frequency of micturition: Secondary | ICD-10-CM

## 2017-09-25 DIAGNOSIS — N393 Stress incontinence (female) (male): Secondary | ICD-10-CM

## 2017-09-25 DIAGNOSIS — C61 Malignant neoplasm of prostate: Secondary | ICD-10-CM | POA: Diagnosis not present

## 2017-09-25 DIAGNOSIS — N401 Enlarged prostate with lower urinary tract symptoms: Secondary | ICD-10-CM | POA: Diagnosis not present

## 2017-09-25 LAB — BLADDER SCAN AMB NON-IMAGING

## 2017-09-25 NOTE — Progress Notes (Signed)
09/25/2017 9:23 AM   Tyler Morgan [birth year] [id number]  Referring provider: Guadalupe Maple, MD [address] [place], [place] [address]  Chief Complaint  Patient presents with  . 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  A total of 114 g were resected.  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  He does have a personal history of elevated PSA. He is undergone biopsy in the past which was negative (~10 years ago with Dr. Bernardo Heater, records not available). Preop PSA 8.5 on 10/11/16.   Most recent PSA on 09/19/2017 0.9.  Today, is very pleased with the use voiding.  He has an excellent stream is able to empty completely.  He is of no concern for retention.  He is off of his Flomax but continues on finasteride.  He would like to stop this medication.  IPSS as below.  He does have extremely mild persistent stress incontinence.  This is exacerbated by activity and better with rest.  He wears only one safety pad per day which on less active days is almost dry but can be more saturated on physically active days.  This is only minimally bothersome to him.  He also has a history of significant phimosis. He is used creams with some success but is unable to completely retract his foreskin. He previously considered circumcision but ultimately declined.  PVR 8 cc.   IPSS    Row Name 09/25/17 0900         International Prostate Symptom Score   How often have you had the sensation of not emptying your bladder?  Not at All     How often have you had to urinate less than every two hours?  Less than 1 in 5 times     How often have you found you stopped and started again several times when you  urinated?  Not at All     How often have you found it difficult to postpone urination?  Not at All     How often have you had a weak urinary stream?  Not at All     How many times did you typically get up at night to urinate?  1 Time     Total IPSS Score  2       Quality of Life due to urinary symptoms   If you were to spend the rest of your life with your urinary condition just the way it is now how would you feel about that?  Pleased        Score:  1-7 Mild 8-19 Moderate 20-35 Severe    PMH: Past Medical History:  Diagnosis Date  . Diabetes mellitus without complication ([place])   . ED (erectile dysfunction)   . Hypertension   . Neuropathy   . Prostate enlargement   . Urinary retention     Surgical History: Past Surgical History:  Procedure Laterality Date  . BASAL CELL CARCINOMA EXCISION     nose   . CATARACT EXTRACTION Bilateral 1986, 2014  . EYE SURGERY Bilateral [id number]  . FRACTURE SURGERY Right    as a child/ right arm  . HERNIA REPAIR Right 01-13-14   inguinal hernia  . 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  N/A 02/07/2017   Procedure: 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;  Surgeon: Hollice Espy, MD;  Location: [facility] ORS;  Service: Urology;  Laterality: N/A;  . RETINAL DETACHMENT SURGERY  Jan 2015  . TONSILLECTOMY  1955    Home Medications:  Allergies as of 09/25/2017      Reactions   Acetaminophen Swelling, Other (See Comments)   Swelling of liver    Amlodipine Swelling      Medication List        Accurate as of 09/25/17  9:23 AM. Always use your most recent med list.          atenolol 50 MG tablet Commonly known as:  TENORMIN TAKE ONE TABLET EVERY DAY   atorvastatin 20 MG tablet Commonly known as:  LIPITOR Take 1 tablet (20 mg total) by mouth daily.   benazepril 40 MG tablet Commonly known as:  LOTENSIN Take 1 tablet (40 mg total) by mouth daily.   hydrochlorothiazide 25 MG tablet Commonly known  as:  HYDRODIURIL Take 1 tablet (25 mg total) by mouth daily.   ibuprofen 200 MG tablet Commonly known as:  ADVIL,MOTRIN Take 200 mg by mouth every 6 (six) hours as needed for headache or moderate pain.   metFORMIN 500 MG tablet Commonly known as:  GLUCOPHAGE Take 500 mg by mouth 2 (two) times daily with a meal.   miconazole 2 % cream Commonly known as:  MICOTIN Apply 1 application topically daily.   TURMERIC PO Take 1 capsule by mouth daily.   Vitamin D-3 1000 units Caps Take 2,000 Units by mouth daily.       Allergies:  Allergies  Allergen Reactions  . Acetaminophen Swelling and Other (See Comments)    Swelling of liver   . Amlodipine Swelling    Family History: Family History  Problem Relation Age of Onset  . Colon cancer Father   . Angina Father   . Macular degeneration Father   . Alzheimer's disease Mother   . Prostate cancer Neg Hx   . Bladder Cancer Neg Hx   . Kidney cancer Neg Hx     Social History:  reports that he quit smoking about 33 years ago. His smoking use included cigarettes. He has a 13.00 pack-year smoking history. He has never used smokeless tobacco. He reports that he drinks about 1.2 oz of alcohol per week. He reports that he does not use drugs.  ROS: UROLOGY Frequent Urination?: No Hard to postpone urination?: No Burning/pain with urination?: No Get up at night to urinate?: No Leakage of urine?: No Urine stream starts and stops?: No Trouble starting stream?: No Do you have to strain to urinate?: No Blood in urine?: No Urinary tract infection?: No Sexually transmitted disease?: No Injury to kidneys or bladder?: No Painful intercourse?: No Weak stream?: No Erection problems?: No Penile pain?: No  Gastrointestinal Nausea?: No Vomiting?: No Indigestion/heartburn?: No Diarrhea?: No Constipation?: No  Constitutional Fever: No Night sweats?: No Weight loss?: No Fatigue?: No  Skin Skin rash/lesions?: No Itching?:  No  Eyes Blurred vision?: No Double vision?: No  Ears/Nose/Throat Sore throat?: No Sinus problems?: No  Hematologic/Lymphatic Swollen glands?: No Easy bruising?: No  Cardiovascular Leg swelling?: No Chest pain?: No  Respiratory Cough?: No Shortness of breath?: No  Endocrine Excessive thirst?: No  Musculoskeletal Back pain?: No Joint pain?: No  Neurological Headaches?: No Dizziness?: No  Psychologic Depression?: No Anxiety?: No  Physical Exam: BP (!) 148/77   Pulse 76   Ht 6'  1" (1.854 m)   Wt 190 lb (86.2 kg)   BMI 25.07 kg/m   Constitutional:  Alert and oriented, No acute distress. HEENT: [place] AT, moist mucus membranes.  Trachea midline, no masses. Cardiovascular: No clubbing, cyanosis, or edema. Respiratory: Normal respiratory effort, no increased work of breathing. Skin: No rashes, bruises or suspicious lesions. Neurologic: Grossly intact, no focal deficits, moving all 4 extremities. Psychiatric: Normal mood and affect.  Laboratory Data: Lab Results  Component Value Date   WBC 5.3 01/30/2017   HGB 14.9 01/30/2017   HCT 44.3 01/30/2017   MCV 85.8 01/30/2017   PLT 208 01/30/2017    Lab Results  Component Value Date   CREATININE 0.81 07/30/2017    Lab Results  Component Value Date   HGBA1C 8.2 07/21/2015    Urinalysis N/a  Pertinent Imaging: Results for orders placed or performed in visit on 09/25/17  BLADDER SCAN AMB NON-IMAGING  Result Value Ref Range   Scan Result 39ml      Assessment & Plan:    1. Benign prostatic hyperplasia with urinary frequency Doing extremely well from urinary standpoint Pleased with voiding symptoms overall Okay to stop finasteride - BLADDER SCAN AMB NON-IMAGING  2. Prostate cancer [facility]) We will continue to monitor with serial PSA New baseline PSA presently 0.9 but still on finasteride He was stopped finasteride today and anticipated PSA doubling in 6 months We will recheck in 6 months  3. Stress  incontinence of urine Mild stress urinary incontinence exacerbated by activity Given the volume of prostate tissue resected, this is not unanticipated He would likely benefit from pelvic floor therapy and is willing to pursue this - Ambulatory referral to Physical Therapy   Return in about 6 months (around 03/28/2018) for PSA.  Hollice Espy, MD  [facility] [address] [place], [place] [address] [phone]

## 2017-10-30 ENCOUNTER — Other Ambulatory Visit: Payer: Self-pay | Admitting: Family Medicine

## 2017-10-30 DIAGNOSIS — E119 Type 2 diabetes mellitus without complications: Secondary | ICD-10-CM

## 2017-10-30 DIAGNOSIS — I10 Essential (primary) hypertension: Secondary | ICD-10-CM

## 2017-10-30 NOTE — Telephone Encounter (Signed)
Patient called, left VM to return call to the office to discuss the Metformin refill request. It is stated the medication was discontinued on 02/07/17 due to a change in therapy. Is the patient still taking Metformin? It is still listed on his medication profile. Last OV 07/30/17 notes patient taking Metformin 500 mg BID.   Metformin refill Last refill:01/30/17 by historical provider  Benazepril refill Last refill:10/17/16 (expired) Last OV:07/30/17 [facility]:[id number] Pharmacy: [facility] - [place], [place] - [place] [phone] (Phone) [phone] (Fax)

## 2017-11-01 NOTE — Telephone Encounter (Signed)
Patient returned call from the message left on 10/30/17 below, I advised that I routed to the provider on 10/30/17 and it was refilled on 10/31/17 by Dr. Wynetta Emery, he verbalized understanding.

## 2017-11-20 ENCOUNTER — Encounter: Payer: Medicare Other | Admitting: Family Medicine

## 2017-11-29 ENCOUNTER — Other Ambulatory Visit: Payer: Self-pay | Admitting: Family Medicine

## 2017-11-29 DIAGNOSIS — I10 Essential (primary) hypertension: Secondary | ICD-10-CM

## 2017-11-29 NOTE — Telephone Encounter (Signed)
Requested Prescriptions  Pending Prescriptions Disp Refills  . atenolol (TENORMIN) 50 MG tablet [Pharmacy Med Name: ATENOLOL 50 MG TAB] 90 tablet 3    Sig: TAKE ONE TABLET EVERY DAY     Cardiovascular:  Beta Blockers Failed - 11/29/2017 11:42 AM      Failed - Last BP in normal range    BP Readings from Last 1 Encounters:  09/25/17 (!) 148/77         Passed - Last Heart Rate in normal range    Pulse Readings from Last 1 Encounters:  09/25/17 76         Passed - Valid encounter within last 6 months    Recent Outpatient Visits          4 months ago Essential hypertension   [facility] [place], Jeannette How, MD   8 months ago Essential hypertension   [place], Jeannette How, MD   10 months ago Essential hypertension   [place], [place], [place]   10 months ago Essential hypertension   [facility] [place], [place]   1 year ago Essential hypertension   [place], Jeannette How, MD      Future Appointments            In 2 weeks [place], Jeannette How, MD [facility], [place]   In 4 months Hollice Espy, MD [place]   In 9 months  [facility], [place]

## 2017-12-06 DIAGNOSIS — Z85828 Personal history of other malignant neoplasm of skin: Secondary | ICD-10-CM | POA: Diagnosis not present

## 2017-12-06 DIAGNOSIS — L578 Other skin changes due to chronic exposure to nonionizing radiation: Secondary | ICD-10-CM | POA: Diagnosis not present

## 2017-12-06 DIAGNOSIS — B360 Pityriasis versicolor: Secondary | ICD-10-CM | POA: Diagnosis not present

## 2017-12-06 DIAGNOSIS — L400 Psoriasis vulgaris: Secondary | ICD-10-CM | POA: Diagnosis not present

## 2017-12-06 DIAGNOSIS — L820 Inflamed seborrheic keratosis: Secondary | ICD-10-CM | POA: Diagnosis not present

## 2017-12-13 ENCOUNTER — Encounter: Payer: Self-pay | Admitting: Family Medicine

## 2017-12-13 ENCOUNTER — Ambulatory Visit (INDEPENDENT_AMBULATORY_CARE_PROVIDER_SITE_OTHER): Payer: Medicare Other | Admitting: Family Medicine

## 2017-12-13 VITALS — BP 130/77 | HR 63 | Ht 70.83 in | Wt 197.5 lb

## 2017-12-13 DIAGNOSIS — K649 Unspecified hemorrhoids: Secondary | ICD-10-CM

## 2017-12-13 DIAGNOSIS — E1100 Type 2 diabetes mellitus with hyperosmolarity without nonketotic hyperglycemic-hyperosmolar coma (NKHHC): Secondary | ICD-10-CM | POA: Diagnosis not present

## 2017-12-13 DIAGNOSIS — I10 Essential (primary) hypertension: Secondary | ICD-10-CM

## 2017-12-13 DIAGNOSIS — G629 Polyneuropathy, unspecified: Secondary | ICD-10-CM | POA: Insufficient documentation

## 2017-12-13 DIAGNOSIS — Z7189 Other specified counseling: Secondary | ICD-10-CM

## 2017-12-13 DIAGNOSIS — E1365 Other specified diabetes mellitus with hyperglycemia: Secondary | ICD-10-CM

## 2017-12-13 DIAGNOSIS — Z23 Encounter for immunization: Secondary | ICD-10-CM | POA: Diagnosis not present

## 2017-12-13 DIAGNOSIS — Z8546 Personal history of malignant neoplasm of prostate: Secondary | ICD-10-CM | POA: Insufficient documentation

## 2017-12-13 DIAGNOSIS — IMO0002 Reserved for concepts with insufficient information to code with codable children: Secondary | ICD-10-CM

## 2017-12-13 DIAGNOSIS — E7800 Pure hypercholesterolemia, unspecified: Secondary | ICD-10-CM

## 2017-12-13 DIAGNOSIS — E1349 Other specified diabetes mellitus with other diabetic neurological complication: Secondary | ICD-10-CM | POA: Diagnosis not present

## 2017-12-13 DIAGNOSIS — N400 Enlarged prostate without lower urinary tract symptoms: Secondary | ICD-10-CM

## 2017-12-13 MED ORDER — HYDROCORTISONE 2.5 % RE CREA: 1.0000 "application " | TOPICAL_CREAM | Freq: Two times a day (BID) | RECTAL | 0 refills | Status: DC

## 2017-12-13 MED ORDER — ATENOLOL 50 MG PO TABS: 50.0000 mg | ORAL_TABLET | Freq: Every day | ORAL | 4 refills | Status: DC

## 2017-12-13 MED ORDER — ATORVASTATIN CALCIUM 20 MG PO TABS: 20.0000 mg | ORAL_TABLET | Freq: Every day | ORAL | 4 refills | Status: DC

## 2017-12-13 NOTE — Assessment & Plan Note (Signed)
Stable not worse

## 2017-12-13 NOTE — Patient Instructions (Signed)

## 2017-12-13 NOTE — Assessment & Plan Note (Signed)
We will give some Anusol HC cream and patient has not had a colonoscopy for some time will refer for colonoscopy.

## 2017-12-13 NOTE — Assessment & Plan Note (Signed)
The current medical regimen is effective;  continue present plan and medications.  

## 2017-12-13 NOTE — Progress Notes (Signed)
BP 130/77 (BP Location: Left Arm, Patient Position: Sitting, Cuff Size: Normal)   Pulse 63   Ht 5' 10.83" (1.799 m)   Wt 197 lb 8 oz (89.6 kg)   SpO2 98%   BMI 27.68 kg/m    Subjective:    Patient ID: Tyler Morgan, male    DOB: 1945-10-13, 72 y.o.   MRN: [id number]  HPI: HARJIT LEIDER is a 72 y.o. male  Chief Complaint  Patient presents with  . Annual Exam  Patient all in all doing well no low blood sugar spells taking diabetes medicines without problems. Blood pressure also doing well with medications no complaints or concerns. Same with cholesterol medicine doing well. Patient does have some occasional bleeding hemorrhoids which been ongoing for almost a year or so have been intermittent seem to have declined somewhat after stopping finasteride.  BPH symptoms are doing okay. Has not tried any over-the-counter medications.   Relevant past medical, surgical, family and social history reviewed and updated as indicated. Interim medical history since our last visit reviewed. Allergies and medications reviewed and updated.  Review of Systems  Constitutional: Negative.   HENT: Negative.   Eyes: Negative.   Respiratory: Negative.   Cardiovascular: Negative.   Gastrointestinal: Negative.   Endocrine: Negative.   Genitourinary: Negative.   Musculoskeletal: Negative.   Skin: Negative.   Allergic/Immunologic: Negative.   Neurological: Negative.   Hematological: Negative.   Psychiatric/Behavioral: Negative.     Per HPI unless specifically indicated above     Objective:    BP 130/77 (BP Location: Left Arm, Patient Position: Sitting, Cuff Size: Normal)   Pulse 63   Ht 5' 10.83" (1.799 m)   Wt 197 lb 8 oz (89.6 kg)   SpO2 98%   BMI 27.68 kg/m   Wt Readings from Last 3 Encounters:  12/13/17 197 lb 8 oz (89.6 kg)  09/25/17 190 lb (86.2 kg)  09/19/17 195 lb 12.8 oz (88.8 kg)    Physical Exam  Constitutional: He is oriented to person, place, and time. He  appears well-developed and well-nourished.  HENT:  Head: Normocephalic and atraumatic.  Right Ear: External ear normal.  Left Ear: External ear normal.  Eyes: Pupils are equal, round, and reactive to light. Conjunctivae and EOM are normal.  Neck: Normal range of motion. Neck supple.  Cardiovascular: Normal rate, regular rhythm, normal heart sounds and intact distal pulses.  Pulmonary/Chest: Effort normal and breath sounds normal.  Abdominal: Soft. Bowel sounds are normal. There is no splenomegaly or hepatomegaly.  Genitourinary:  Genitourinary Comments: Done at urology  Musculoskeletal: Normal range of motion.  Neurological: He is alert and oriented to person, place, and time. He has normal reflexes.  Skin: No rash noted. No erythema.  Psychiatric: He has a normal mood and affect. His behavior is normal. Judgment and thought content normal.    Results for orders placed or performed in visit on 09/25/17  BLADDER SCAN AMB NON-IMAGING  Result Value Ref Range   Scan Result 53ml       Assessment & Plan:   Problem List Items Addressed This Visit      Cardiovascular and Mediastinum   Hypertension    The current medical regimen is effective;  continue present plan and medications.       Relevant Medications   atorvastatin (LIPITOR) 20 MG tablet   atenolol (TENORMIN) 50 MG tablet   Other Relevant Orders   CBC with Differential/Platelet   Comprehensive metabolic panel   TSH  Urinalysis, Routine w reflex microscopic   Hemorrhoids    We will give some Anusol HC cream and patient has not had a colonoscopy for some time will refer for colonoscopy.      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      Relevant Medications   atorvastatin (LIPITOR) 20 MG tablet   Other Relevant Orders   TSH     Nervous and Auditory   Neuropathy    Stable not worse        Other   Advanced care planning/counseling discussion    A voluntary discussion about advanced care planning including explanation and discussion of advanced directives was extentively discussed with the patient.  Explained about the healthcare proxy and living will was reviewed and packet with forms with expiration of how to fill them out was given.  Time spent: Encounter 16+ min individuals present: Patient      Hypercholesteremia    The current medical regimen is effective;  continue present plan and medications.       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

## 2017-12-13 NOTE — Assessment & Plan Note (Signed)
Not as bad as expected with kitchen out of service this summer with remodeling patients just getting his kitchen done now and will be doing better with diet and nutrition.

## 2017-12-13 NOTE — Assessment & Plan Note (Signed)
Surgery December 2018

## 2017-12-13 NOTE — Assessment & Plan Note (Signed)
A voluntary discussion about advanced care planning including explanation and discussion of advanced directives was extentively discussed with the patient.  Explained about the healthcare proxy and living will was reviewed and packet with forms with expiration of how to fill them out was given.  Time spent: Encounter 16+ min individuals present: Patient 

## 2017-12-14 ENCOUNTER — Encounter: Payer: Self-pay | Admitting: Family Medicine

## 2017-12-14 LAB — CBC WITH DIFFERENTIAL/PLATELET
BASOS ABS: 0 10*3/uL (ref 0.0–0.2)
Basos: 1 %
EOS (ABSOLUTE): 0.3 10*3/uL (ref 0.0–0.4)
Eos: 7 %
Hematocrit: 41.9 % (ref 37.5–51.0)
Hemoglobin: 14.1 g/dL (ref 13.0–17.7)
IMMATURE GRANS (ABS): 0 10*3/uL (ref 0.0–0.1)
IMMATURE GRANULOCYTES: 0 %
Lymphocytes Absolute: 1.4 10*3/uL (ref 0.7–3.1)
Lymphs: 31 %
MCH: 29.1 pg (ref 26.6–33.0)
MCHC: 33.7 g/dL (ref 31.5–35.7)
MCV: 86 fL (ref 79–97)
Monocytes Absolute: 0.3 10*3/uL (ref 0.1–0.9)
Monocytes: 7 %
NEUTROS PCT: 54 %
Neutrophils Absolute: 2.5 10*3/uL (ref 1.4–7.0)
Platelets: 221 10*3/uL (ref 150–450)
RBC: 4.85 x10E6/uL (ref 4.14–5.80)
RDW: 12.7 % (ref 12.3–15.4)
WBC: 4.6 10*3/uL (ref 3.4–10.8)

## 2017-12-14 LAB — URINALYSIS, ROUTINE W REFLEX MICROSCOPIC
Bilirubin, UA: NEGATIVE
KETONES UA: NEGATIVE
LEUKOCYTES UA: NEGATIVE
Nitrite, UA: NEGATIVE
Protein, UA: NEGATIVE
Specific Gravity, UA: 1.02 (ref 1.005–1.030)
Urobilinogen, Ur: 1 mg/dL (ref 0.2–1.0)
pH, UA: 7 (ref 5.0–7.5)

## 2017-12-14 LAB — COMPREHENSIVE METABOLIC PANEL
ALT: 17 IU/L (ref 0–44)
AST: 16 IU/L (ref 0–40)
Albumin/Globulin Ratio: 2.1 (ref 1.2–2.2)
Albumin: 4.4 g/dL (ref 3.5–4.8)
Alkaline Phosphatase: 50 IU/L (ref 39–117)
BILIRUBIN TOTAL: 0.5 mg/dL (ref 0.0–1.2)
BUN/Creatinine Ratio: 20 (ref 10–24)
BUN: 14 mg/dL (ref 8–27)
CALCIUM: 9.4 mg/dL (ref 8.6–10.2)
CHLORIDE: 102 mmol/L (ref 96–106)
CO2: 26 mmol/L (ref 20–29)
Creatinine, Ser: 0.71 mg/dL — ABNORMAL LOW (ref 0.76–1.27)
GFR, EST AFRICAN AMERICAN: 108 mL/min/{1.73_m2} (ref >59)
GFR, EST NON AFRICAN AMERICAN: 94 mL/min/{1.73_m2} (ref >59)
GLUCOSE: 159 mg/dL — AB (ref 65–99)
Globulin, Total: 2.1 g/dL (ref 1.5–4.5)
Potassium: 4 mmol/L (ref 3.5–5.2)
Sodium: 143 mmol/L (ref 134–144)
TOTAL PROTEIN: 6.5 g/dL (ref 6.0–8.5)

## 2017-12-14 LAB — LIPID PANEL W/O CHOL/HDL RATIO
Cholesterol, Total: 147 mg/dL (ref 100–199)
HDL: 39 mg/dL — AB (ref >39)
LDL Calculated: 83 mg/dL (ref 0–99)
TRIGLYCERIDES: 124 mg/dL (ref 0–149)
VLDL CHOLESTEROL CAL: 25 mg/dL (ref 5–40)

## 2017-12-14 LAB — MICROSCOPIC EXAMINATION: WBC, UA: NONE SEEN /hpf (ref 0–5)

## 2017-12-14 LAB — BAYER DCA HB A1C WAIVED: HB A1C (BAYER DCA - WAIVED): 6.9 % (ref <7.0)

## 2017-12-14 LAB — TSH: TSH: 1.13 u[IU]/mL (ref 0.450–4.500)

## 2017-12-19 ENCOUNTER — Encounter: Payer: Self-pay | Admitting: *Deleted

## 2018-02-18 ENCOUNTER — Encounter: Payer: Self-pay | Admitting: Family Medicine

## 2018-03-04 ENCOUNTER — Ambulatory Visit: Payer: Medicare Other | Admitting: Gastroenterology

## 2018-03-04 ENCOUNTER — Encounter: Payer: Self-pay | Admitting: Gastroenterology

## 2018-03-04 ENCOUNTER — Other Ambulatory Visit: Payer: Self-pay

## 2018-03-04 VITALS — BP 136/80 | HR 73 | Resp 17 | Ht 70.83 in | Wt 198.6 lb

## 2018-03-04 DIAGNOSIS — K625 Hemorrhage of anus and rectum: Secondary | ICD-10-CM | POA: Diagnosis not present

## 2018-03-04 NOTE — Progress Notes (Signed)
Cephas Darby, MD [address]  [place]  [place], [place] [address]  Main: [phone]  Fax: [phone]    Gastroenterology Consultation  Referring Provider:     Guadalupe Maple, MD Primary Care Physician:  Guadalupe Maple, MD Primary Gastroenterologist:  Dr. Cephas Darby Reason for Consultation:     Rectal bleeding        HPI:   Tyler Morgan is a 73 y.o. male referred by Dr. Guadalupe Maple, MD  for consultation & management of 1 year history of intermittent episodes of rectal bleeding, painless.  He reports these episodes as bright red blood per rectum, on wiping, drops of blood and sometimes dripping in the toilet bowl with a bowel movement.  He denies constipation or diarrhea.  He denies abdominal pain, nausea or vomiting, change in stool caliber.  He is here to discuss about colonoscopy for further evaluation.  He underwent colonoscopy in 2013 by Dr. Vira Agar, reportedly normal.  Hemoglobin 14.1 in 11/2017.  Denies smoking tobacco.  Drinks alcohol occasionally.  He denies any abdominal surgeries  NSAIDs: None  Antiplts/Anticoagulants/Anti thrombotics: None  GI Procedures: Colonoscopy in 2013, reportedly normal  Past Medical History:  Diagnosis Date  . Diabetes mellitus without complication ([place])   . ED (erectile dysfunction)   . Hypertension   . Neuropathy   . Prostate enlargement   . Urinary retention     Past Surgical History:  Procedure Laterality Date  . BASAL CELL CARCINOMA EXCISION     nose   . CATARACT EXTRACTION Bilateral 1986, 2014  . EYE SURGERY Bilateral [id number]  . FRACTURE SURGERY Right    as a child/ right arm  . HERNIA REPAIR Right 01-13-14   inguinal hernia  . 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 RETINAL DETACHMENT SURGERY   Jan 2015  . TONSILLECTOMY  1955    Current Outpatient Medications:  .  atenolol (TENORMIN) 50 MG tablet, Take 1 tablet (50 mg total) by mouth daily., Disp: 90 tablet, Rfl: 4 .  atorvastatin (LIPITOR) 20 MG tablet, Take 1 tablet (20 mg total) by mouth daily., Disp: 90 tablet, Rfl: 4 .  benazepril (LOTENSIN) 40 MG tablet, TAKE ONE TABLET BY MOUTH EVERY DAY, Disp: 90 tablet, Rfl: 4 .  Cholecalciferol (VITAMIN D-3) 1000 units CAPS, Take 2,000 Units by mouth daily. , Disp: , Rfl:  .  hydrochlorothiazide (HYDRODIURIL) 25 MG tablet, TAKE ONE TABLET BY MOUTH EVERY DAY, Disp: 90 tablet, Rfl: 4 .  ibuprofen (ADVIL,MOTRIN) 200 MG tablet, Take 200 mg by mouth every 6 (six) hours as needed for headache or moderate pain., Disp: , Rfl:  .  metFORMIN (GLUCOPHAGE) 500 MG tablet, TAKE TWO TABLETS BY MOUTH TWICE DAILY WITH A MEAL, Disp: 360 tablet, Rfl: 4 .  miconazole (MICOTIN) 2 % cream, Apply 1 application topically daily., Disp: , Rfl:  .  TURMERIC PO, Take 1 capsule by mouth daily. , Disp: , Rfl:  .  hydrocortisone (ANUSOL-HC) 2.5 % rectal cream, Place 1 application rectally 2 (two) times daily. (Patient not taking: Reported on 03/04/2018), Disp: 30 g, Rfl: 0  Family History  Problem Relation Age of Onset  . Colon cancer Father   . Angina Father   . Macular degeneration Father   . Alzheimer's disease Mother   . Prostate cancer  Neg Hx   . Bladder Cancer Neg Hx   . Kidney cancer Neg Hx      Social History   Tobacco Use  . Smoking status: Former Smoker    Packs/day: 1.00    Years: 13.00    Pack years: 13.00    Types: Cigarettes    Last attempt to quit: 02/21/1984    Years since quitting: 34.0  . Smokeless tobacco: Never Used  Substance Use Topics  . Alcohol use: Yes    Alcohol/week: 2.0 standard drinks    Types: 2 Cans of beer per week    Comment: wine occasionally   . Drug use: No    Allergies as of 03/04/2018 - Review Complete 03/04/2018  Allergen Reaction Noted  . Acetaminophen  Swelling and Other (See Comments) 12/26/2013  . Amlodipine Swelling 12/11/2014    Review of Systems:    All systems reviewed and negative except where noted in HPI.   Physical Exam:  BP 136/80 (BP Location: Left Arm, Patient Position: Sitting, Cuff Size: Large)   Pulse 73   Resp 17   Ht 5' 10.83" (1.799 m)   Wt 198 lb 9.6 oz (90.1 kg)   BMI 27.83 kg/m  No LMP for male patient.  General:   Alert,  Well-developed, well-nourished, pleasant and cooperative in NAD Head:  Normocephalic and atraumatic. Eyes:  Sclera clear, no icterus.   Conjunctiva pink. Ears:  Normal auditory acuity. Nose:  No deformity, discharge, or lesions. Mouth:  No deformity or lesions,oropharynx pink & moist. Neck:  Supple; no masses or thyromegaly. Lungs:  Respirations even and unlabored.  Clear throughout to auscultation.   No wheezes, crackles, or rhonchi. No acute distress. Heart:  Regular rate and rhythm; no murmurs, clicks, rubs, or gallops. Abdomen:  Normal bowel sounds. Soft, non-tender and non-distended without masses, hepatosplenomegaly or hernias noted.  No guarding or rebound tenderness.   Rectal: Not performed Msk:  Symmetrical without gross deformities. Good, equal movement & strength bilaterally. Pulses:  Normal pulses noted. Extremities:  No clubbing or edema.  No cyanosis. Neurologic:  Alert and oriented x3;  grossly normal neurologically. Skin:  Intact without significant lesions or rashes. No jaundice. Psych:  Alert and cooperative. Normal mood and affect.  Imaging Studies: No abdominal imaging  Assessment and Plan:   Tyler Morgan is a 73 y.o. Caucasian male with metabolic syndrome, with chronic intermittent painless bright red blood per rectum.  Rectal bleeding Recommend diagnostic colonoscopy If negative, perform outpatient hemorrhoid ligation  Information provided  I have discussed alternative options, risks & benefits,  which include, but are not limited to, bleeding,  infection, perforation,respiratory complication & drug reaction.  The patient agrees with this plan & written consent will be obtained.     Follow up in 4 weeks after colonoscopy   Cephas Darby, MD

## 2018-03-05 ENCOUNTER — Telehealth: Payer: Self-pay | Admitting: Gastroenterology

## 2018-03-05 NOTE — Telephone Encounter (Signed)
Patient called & l/m on V/M he needed to cancel colonoscopy scheduled with Dr Marius Ditch on 03-20-2018 & r/s.

## 2018-03-05 NOTE — Telephone Encounter (Signed)
LM on VM his procedure is scheduled for 03/20/18.  That date won't work, needs to reschedule for the next week if possible.

## 2018-03-06 NOTE — Telephone Encounter (Signed)
Patient has called again & would like to cancel his colonoscopy from 03-20-2018 & r/s if possible to 03-27-2018

## 2018-03-07 NOTE — Telephone Encounter (Signed)
Patient called & would like to cancel his colonoscopy from 03-20-2018 and r/s to 03-27-2018 if possible.

## 2018-03-12 NOTE — Telephone Encounter (Signed)
Pt's procedure has been rescheduled to 03/27/18. [place] contacted and changed.

## 2018-03-20 SURGERY — COLONOSCOPY WITH PROPOFOL
Anesthesia: General

## 2018-03-21 ENCOUNTER — Other Ambulatory Visit: Payer: Self-pay

## 2018-03-21 ENCOUNTER — Encounter: Payer: Self-pay | Admitting: *Deleted

## 2018-03-26 NOTE — Discharge Instructions (Signed)
General Anesthesia, Adult, Care After  This sheet gives you information about how to care for yourself after your procedure. Your health care provider may also give you more specific instructions. If you have problems or questions, contact your health care provider.  What can I expect after the procedure?  After the procedure, the following side effects are common:  Pain or discomfort at the IV site.  Nausea.  Vomiting.  Sore throat.  Trouble concentrating.  Feeling cold or chills.  Weak or tired.  Sleepiness and fatigue.  Soreness and body aches. These side effects can affect parts of the body that were not involved in surgery.  Follow these instructions at home:    For at least 24 hours after the procedure:  Have a responsible adult stay with you. It is important to have someone help care for you until you are awake and alert.  Rest as needed.  Do not:  Participate in activities in which you could fall or become injured.  Drive.  Use heavy machinery.  Drink alcohol.  Take sleeping pills or medicines that cause drowsiness.  Make important decisions or sign legal documents.  Take care of children on your own.  Eating and drinking  Follow any instructions from your health care provider about eating or drinking restrictions.  When you feel hungry, start by eating small amounts of foods that are soft and easy to digest (bland), such as toast. Gradually return to your regular diet.  Drink enough fluid to keep your urine pale yellow.  If you vomit, rehydrate by drinking water, juice, or clear broth.  General instructions  If you have sleep apnea, surgery and certain medicines can increase your risk for breathing problems. Follow instructions from your health care provider about wearing your sleep device:  Anytime you are sleeping, including during daytime naps.  While taking prescription pain medicines, sleeping medicines, or medicines that make you drowsy.  Return to your normal activities as told by your health care  provider. Ask your health care provider what activities are safe for you.  Take over-the-counter and prescription medicines only as told by your health care provider.  If you smoke, do not smoke without supervision.  Keep all follow-up visits as told by your health care provider. This is important.  Contact a health care provider if:  You have nausea or vomiting that does not get better with medicine.  You cannot eat or drink without vomiting.  You have pain that does not get better with medicine.  You are unable to pass urine.  You develop a skin rash.  You have a fever.  You have redness around your IV site that gets worse.  Get help right away if:  You have difficulty breathing.  You have chest pain.  You have blood in your urine or stool, or you vomit blood.  Summary  After the procedure, it is common to have a sore throat or nausea. It is also common to feel tired.  Have a responsible adult stay with you for the first 24 hours after general anesthesia. It is important to have someone help care for you until you are awake and alert.  When you feel hungry, start by eating small amounts of foods that are soft and easy to digest (bland), such as toast. Gradually return to your regular diet.  Drink enough fluid to keep your urine pale yellow.  Return to your normal activities as told by your health care provider. Ask your health care   provider what activities are safe for you.  This information is not intended to replace advice given to you by your health care provider. Make sure you discuss any questions you have with your health care provider.  Document Released: 05/15/2000 Document Revised: 09/22/2016 Document Reviewed: 09/22/2016  Elsevier Interactive Patient Education  2019 [employer]

## 2018-03-27 ENCOUNTER — Ambulatory Visit
Admission: RE | Admit: 2018-03-27 | Discharge: 2018-03-27 | Disposition: A | Discharge status: Home / Self Care | Payer: Medicare Other | Attending: Gastroenterology | Admitting: Gastroenterology

## 2018-03-27 ENCOUNTER — Encounter
Admission: RE | Disposition: A | Discharge status: Home / Self Care | Payer: Self-pay | Source: Home / Self Care | Attending: Gastroenterology

## 2018-03-27 ENCOUNTER — Ambulatory Visit: Payer: Medicare Other | Admitting: Anesthesiology

## 2018-03-27 ENCOUNTER — Other Ambulatory Visit: Payer: Medicare Other

## 2018-03-27 ENCOUNTER — Other Ambulatory Visit: Payer: Self-pay

## 2018-03-27 DIAGNOSIS — C61 Malignant neoplasm of prostate: Secondary | ICD-10-CM

## 2018-03-27 DIAGNOSIS — Z8249 Family history of ischemic heart disease and other diseases of the circulatory system: Secondary | ICD-10-CM | POA: Diagnosis not present

## 2018-03-27 DIAGNOSIS — Z87891 Personal history of nicotine dependence: Secondary | ICD-10-CM | POA: Insufficient documentation

## 2018-03-27 DIAGNOSIS — K644 Residual hemorrhoidal skin tags: Secondary | ICD-10-CM | POA: Insufficient documentation

## 2018-03-27 DIAGNOSIS — R338 Other retention of urine: Secondary | ICD-10-CM | POA: Insufficient documentation

## 2018-03-27 DIAGNOSIS — Z7984 Long term (current) use of oral hypoglycemic drugs: Secondary | ICD-10-CM | POA: Diagnosis not present

## 2018-03-27 DIAGNOSIS — K625 Hemorrhage of anus and rectum: Secondary | ICD-10-CM | POA: Diagnosis not present

## 2018-03-27 DIAGNOSIS — Z800 Family history of malignant neoplasm of digestive organs: Secondary | ICD-10-CM | POA: Insufficient documentation

## 2018-03-27 DIAGNOSIS — Z85828 Personal history of other malignant neoplasm of skin: Secondary | ICD-10-CM | POA: Insufficient documentation

## 2018-03-27 DIAGNOSIS — Z9842 Cataract extraction status, left eye: Secondary | ICD-10-CM | POA: Insufficient documentation

## 2018-03-27 DIAGNOSIS — K648 Other hemorrhoids: Secondary | ICD-10-CM | POA: Diagnosis not present

## 2018-03-27 DIAGNOSIS — Z888 Allergy status to other drugs, medicaments and biological substances status: Secondary | ICD-10-CM | POA: Diagnosis not present

## 2018-03-27 DIAGNOSIS — Z9841 Cataract extraction status, right eye: Secondary | ICD-10-CM | POA: Insufficient documentation

## 2018-03-27 DIAGNOSIS — E1140 Type 2 diabetes mellitus with diabetic neuropathy, unspecified: Secondary | ICD-10-CM | POA: Insufficient documentation

## 2018-03-27 DIAGNOSIS — N401 Enlarged prostate with lower urinary tract symptoms: Secondary | ICD-10-CM | POA: Diagnosis not present

## 2018-03-27 DIAGNOSIS — D123 Benign neoplasm of transverse colon: Secondary | ICD-10-CM | POA: Diagnosis not present

## 2018-03-27 DIAGNOSIS — Z820 Family history of epilepsy and other diseases of the nervous system: Secondary | ICD-10-CM | POA: Diagnosis not present

## 2018-03-27 DIAGNOSIS — D120 Benign neoplasm of cecum: Secondary | ICD-10-CM | POA: Insufficient documentation

## 2018-03-27 DIAGNOSIS — I10 Essential (primary) hypertension: Secondary | ICD-10-CM | POA: Diagnosis not present

## 2018-03-27 HISTORY — PX: POLYPECTOMY: SHX5525

## 2018-03-27 HISTORY — PX: COLONOSCOPY WITH PROPOFOL: SHX5780

## 2018-03-27 HISTORY — DX: Motion sickness, initial encounter: T75.3XXA

## 2018-03-27 LAB — GLUCOSE, CAPILLARY
GLUCOSE-CAPILLARY: 134 mg/dL — AB (ref 70–99)
Glucose-Capillary: 163 mg/dL — ABNORMAL HIGH (ref 70–99)

## 2018-03-27 SURGERY — COLONOSCOPY WITH PROPOFOL
Anesthesia: General | Site: Rectum

## 2018-03-27 MED ORDER — PROPOFOL 10 MG/ML IV BOLUS
INTRAVENOUS | Status: DC | PRN
  Administered 2018-03-27: 100 mg via INTRAVENOUS
  Administered 2018-03-27: 50 mg via INTRAVENOUS
  Administered 2018-03-27: 20 mg via INTRAVENOUS

## 2018-03-27 MED ORDER — LACTATED RINGERS IV SOLN: INTRAVENOUS | Status: DC

## 2018-03-27 MED ORDER — STERILE WATER FOR IRRIGATION IR SOLN
Status: DC | PRN
  Administered 2018-03-27: 08:00:00

## 2018-03-27 MED ORDER — LIDOCAINE HCL (CARDIAC) PF 100 MG/5ML IV SOSY
PREFILLED_SYRINGE | INTRAVENOUS | Status: DC | PRN
  Administered 2018-03-27: 40 mg via INTRAVENOUS

## 2018-03-27 SURGICAL SUPPLY — 9 items
CANISTER SUCT 1200ML W/VALVE (MISCELLANEOUS) ×3 IMPLANT
FORCEPS BIOP RAD 4 LRG CAP 4 (CUTTING FORCEPS) ×3 IMPLANT
GOWN CVR UNV OPN BCK APRN NK (MISCELLANEOUS) ×2 IMPLANT
GOWN ISOL THUMB LOOP REG UNIV (MISCELLANEOUS) ×4
KIT ENDO PROCEDURE OLY (KITS) ×3 IMPLANT
SNARE COLD EXACTO (MISCELLANEOUS) ×3 IMPLANT
SNARE SHORT THROW 30M LRG OVAL (MISCELLANEOUS) ×3 IMPLANT
TRAP ETRAP POLY (MISCELLANEOUS) ×3 IMPLANT
WATER STERILE IRR 250ML POUR (IV SOLUTION) ×3 IMPLANT

## 2018-03-27 NOTE — Anesthesia Procedure Notes (Signed)
Procedure Name: MAC Date/Time: 03/27/2018 8:24 AM Performed by: Jeannene Patella, CRNA Pre-anesthesia Checklist: Patient identified, Emergency Drugs available, Suction available, Patient being monitored and Timeout performed Patient Re-evaluated:Patient Re-evaluated prior to induction Oxygen Delivery Method: Nasal cannula Preoxygenation: Pre-oxygenation with 100% oxygen

## 2018-03-27 NOTE — H&P (Signed)
Cephas Darby, MD [address]  [place]  [place], [place] [address]  Main: [phone]  Fax: [phone] Pager: [phone]  Primary Care Physician:  Guadalupe Maple, MD Primary Gastroenterologist:  Dr. Cephas Darby  Pre-Procedure History & Physical: HPI:  Tyler Morgan is a 73 y.o. male is here for an colonoscopy.   Past Medical History:  Diagnosis Date  . Diabetes mellitus without complication ([place])   . ED (erectile dysfunction)   . Hypertension   . Motion sickness    boats  . Neuropathy   . Prostate enlargement   . Urinary retention     Past Surgical History:  Procedure Laterality Date  . BASAL CELL CARCINOMA EXCISION     nose   . CATARACT EXTRACTION Bilateral 1986, 2014  . EYE SURGERY Bilateral [id number]  . FRACTURE SURGERY Right    as a child/ right arm  . HERNIA REPAIR Right 01-13-14   inguinal hernia  . 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 RETINAL DETACHMENT SURGERY  Jan 2015  . TONSILLECTOMY  1955    Prior to Admission medications   Medication Sig Start Date End Date Taking? Authorizing Provider  atenolol (TENORMIN) 50 MG tablet Take 1 tablet (50 mg total) by mouth daily. 12/13/17  Yes Crissman, Jeannette How, MD  atorvastatin (LIPITOR) 20 MG tablet Take 1 tablet (20 mg total) by mouth daily. 12/13/17  Yes Guadalupe Maple, MD  benazepril (LOTENSIN) 40 MG tablet TAKE ONE TABLET BY MOUTH EVERY DAY 10/31/17  Yes Johnson, Megan P, DO  Cholecalciferol (VITAMIN D-3) 1000 units CAPS Take 2,000 Units by mouth daily.    Yes [provider]  hydrochlorothiazide (HYDRODIURIL) 25 MG tablet TAKE ONE TABLET BY MOUTH EVERY DAY 11/29/17  Yes Crissman, Jeannette How, MD  metFORMIN (GLUCOPHAGE) 500 MG tablet TAKE TWO TABLETS BY MOUTH TWICE DAILY WITH A MEAL 10/31/17   Yes Johnson, Megan P, DO  TURMERIC PO Take 1 capsule by mouth daily.    Yes [provider]  hydrocortisone (ANUSOL-HC) 2.5 % rectal cream Place 1 application rectally 2 (two) times daily. Patient not taking: Reported on 03/04/2018 12/13/17   Guadalupe Maple, MD  ibuprofen (ADVIL,MOTRIN) 200 MG tablet Take 200 mg by mouth every 6 (six) hours as needed for headache or moderate pain.    [provider]  miconazole (MICOTIN) 2 % cream Apply 1 application topically daily.    [provider]    Allergies as of 03/04/2018 - Review Complete 03/04/2018  Allergen Reaction Noted  . Acetaminophen Swelling and Other (See Comments) 12/26/2013  . Amlodipine Swelling 12/11/2014    Family History  Problem Relation Age of Onset  . Colon cancer Father   . Angina Father   . Macular degeneration Father   . Alzheimer's disease Mother   . Prostate cancer Neg Hx   . Bladder Cancer Neg Hx   . Kidney cancer Neg Hx     Social History   Socioeconomic History  . Marital status: Married    Spouse name: Not on file  . Number of children: Not on file  . Years of education: Not on file  . Highest education level: Bachelor's degree (e.g., BA, AB, BS)  Occupational History  . Not on file  Social Needs  . Financial resource strain: Not hard  at all  . Food insecurity:    Worry: Never true    Inability: Never true  . Transportation needs:    Medical: No    Non-medical: No  Tobacco Use  . Smoking status: Former Smoker    Packs/day: 1.00    Years: 13.00    Pack years: 13.00    Types: Cigarettes    Last attempt to quit: 02/21/1984    Years since quitting: 34.1  . Smokeless tobacco: Never Used  Substance and Sexual Activity  . Alcohol use: Yes    Alcohol/week: 2.0 standard drinks    Types: 2 Cans of beer per week    Comment: wine occasionally   . Drug use: No  . Sexual activity: Not on file  Lifestyle  . Physical activity:    Days per week: 0 days    Minutes per session:  0 min  . Stress: Not at all  Relationships  . Social connections:    Talks on phone: More than three times a week    Gets together: More than three times a week    Attends religious service: Never    Active member of club or organization: Yes    Attends meetings of clubs or organizations: More than 4 times per year    Relationship status: Married  . Intimate partner violence:    Fear of current or ex partner: No    Emotionally abused: No    Physically abused: No    Forced sexual activity: No  Other Topics Concern  . Not on file  Social History Narrative   Working fulltime     Review of Systems: See HPI, otherwise negative ROS  Physical Exam: BP 138/84   Pulse 71   Temp (!) 97.5 F (36.4 C) (Temporal)   Ht 6\' 1"  (1.854 m)   Wt 85.3 kg   SpO2 97%   BMI 24.80 kg/m  General:   Alert,  pleasant and cooperative in NAD Head:  Normocephalic and atraumatic. Neck:  Supple; no masses or thyromegaly. Lungs:  Clear throughout to auscultation.    Heart:  Regular rate and rhythm. Abdomen:  Soft, nontender and nondistended. Normal bowel sounds, without guarding, and without rebound.   Neurologic:  Alert and  oriented x4;  grossly normal neurologically.  Impression/Plan: AYUB KIRSH is here for an colonoscopy to be performed for rectal bleeding  Risks, benefits, limitations, and alternatives regarding  colonoscopy have been reviewed with the patient.  Questions have been answered.  All parties agreeable.   Sherri Sear, MD  03/27/2018, 8:09 AM

## 2018-03-27 NOTE — Op Note (Signed)
[facility] Gastroenterology Patient Name: Tyler Morgan Procedure Date: 03/27/2018 7:49 AM MRN: [id number] Account #: [id number] Date of Birth: Dec 29, 1945 Admit Type: Outpatient Age: 73 Room: [facility] OR ROOM 01 Gender: Male Note Status: Finalized Procedure:            Colonoscopy Indications:          Last colonoscopy: January 2002, Rectal bleeding Providers:            Lin Landsman MD, MD Referring MD:         Guadalupe Maple, MD (Referring MD) Medicines:            Monitored Anesthesia Care Complications:        No immediate complications. Estimated blood loss: None. Procedure:            Pre-Anesthesia Assessment:                       - Prior to the procedure, a History and Physical was                        performed, and patient medications and allergies were                        reviewed. The patient is competent. The risks and                        benefits of the procedure and the sedation options and                        risks were discussed with the patient. All questions                        were answered and informed consent was obtained.                        Patient identification and proposed procedure were                        verified by the physician, the nurse, the                        anesthesiologist, the anesthetist and the technician in                        the pre-procedure area in the procedure room in the                        endoscopy suite. Mental Status Examination: alert and                        oriented. Airway Examination: normal oropharyngeal                        airway and neck mobility. Respiratory Examination:                        clear to auscultation. CV Examination: normal.                        Prophylactic Antibiotics: The patient does not require  prophylactic antibiotics. Prior Anticoagulants: The                        patient has taken no previous anticoagulant or                         antiplatelet agents. ASA Grade Assessment: II - A                        patient with mild systemic disease. After reviewing the                        risks and benefits, the patient was deemed in                        satisfactory condition to undergo the procedure. The                        anesthesia plan was to use monitored anesthesia care                        (MAC). Immediately prior to administration of                        medications, the patient was re-assessed for adequacy                        to receive sedatives. The heart rate, respiratory rate,                        oxygen saturations, blood pressure, adequacy of                        pulmonary ventilation, and response to care were                        monitored throughout the procedure. The physical status                        of the patient was re-assessed after the procedure.                       After obtaining informed consent, the colonoscope was                        passed under direct vision. Throughout the procedure,                        the patient's blood pressure, pulse, and oxygen                        saturations were monitored continuously. The was                        introduced through the anus and advanced to the the                        cecum, identified by appendiceal orifice and ileocecal  valve. The colonoscopy was performed without                        difficulty. The patient tolerated the procedure well.                        The quality of the bowel preparation was evaluated                        using the BBPS [facility] Bowel Preparation Scale) with                        scores of: Right Colon = 3, Transverse Colon = 3 and                        Left Colon = 3 (entire mucosa seen well with no                        residual staining, small fragments of stool or opaque                        liquid). The total BBPS score equals  9. Findings:      A 4 mm polyp was found in the cecum. The polyp was sessile. The polyp       was removed with a cold biopsy forceps. Resection and retrieval were       complete.      Two sessile polyps were found in the transverse colon. The polyps were 5       to 6 mm in size. These polyps were removed with a cold snare. Resection       and retrieval were complete.      Non-bleeding external and internal hemorrhoids were found during       retroflexion. The hemorrhoids were medium-sized.      The exam was otherwise without abnormality. Impression:           - One 4 mm polyp in the cecum, removed with a cold                        biopsy forceps. Resected and retrieved.                       - Two 5 to 6 mm polyps in the transverse colon, removed                        with a cold snare. Resected and retrieved.                       - Non-bleeding external and internal hemorrhoids.                       - The examination was otherwise normal. Recommendation:       - Discharge patient to home (with spouse).                       - Resume previous diet today.                       - Continue present medications.                       -  Await pathology results.                       - Repeat colonoscopy in 3 - 5 years for surveillance                        based on pathology results.                       - Return to my office as previously scheduled for                        hemorrhoid ligation if rectal bleeding is persistent. Procedure Code(s):    --- Professional ---                       [id number], Colonoscopy, flexible; with removal of tumor(s),                        polyp(s), or other lesion(s) by snare technique                       45380, 26, Colonoscopy, flexible; with biopsy, single                        or multiple Diagnosis Code(s):    --- Professional ---                       D12.0, Benign neoplasm of cecum                       D12.3, Benign neoplasm of transverse colon  (hepatic                        flexure or splenic flexure)                       K64.8, Other hemorrhoids                       K62.5, Hemorrhage of anus and rectum CPT copyright 2018 American Medical Association. All rights reserved. The codes documented in this report are preliminary and upon coder review may  be revised to meet current compliance requirements. Dr. Ulyess Mort Lin Landsman MD, MD 03/27/2018 8:42:02 AM This report has been signed electronically. Number of Addenda: 0 Note Initiated On: 03/27/2018 7:49 AM Scope Withdrawal Time: 0 hours 13 minutes 48 seconds  Total Procedure Duration: 0 hours 17 minutes 3 seconds       [facility]

## 2018-03-27 NOTE — Anesthesia Postprocedure Evaluation (Signed)
Anesthesia Post Note  Patient: Tyler Morgan  Procedure(s) Performed: COLONOSCOPY WITH BIOPSIES (N/A Rectum) POLYPECTOMY (N/A Rectum)  Patient location during evaluation: PACU Anesthesia Type: General Level of consciousness: awake and alert Pain management: pain level controlled Vital Signs Assessment: post-procedure vital signs reviewed and stable Respiratory status: spontaneous breathing Cardiovascular status: stable Anesthetic complications: no    Jaci Standard, III,  [place] D

## 2018-03-27 NOTE — Transfer of Care (Signed)
Immediate Anesthesia Transfer of Care Note  Patient: Tyler Morgan  Procedure(s) Performed: COLONOSCOPY WITH BIOPSIES (N/A Rectum) POLYPECTOMY (N/A Rectum)  Patient Location: PACU  Anesthesia Type: General  Level of Consciousness: awake, alert  and patient cooperative  Airway and Oxygen Therapy: Patient Spontanous Breathing and Patient connected to supplemental oxygen  Post-op Assessment: Post-op Vital signs reviewed, Patient's Cardiovascular Status Stable, Respiratory Function Stable, Patent Airway and No signs of Nausea or vomiting  Post-op Vital Signs: Reviewed and stable  Complications: No apparent anesthesia complications

## 2018-03-27 NOTE — Anesthesia Preprocedure Evaluation (Addendum)
Anesthesia Evaluation  Patient identified by MRN, date of birth, ID band Patient awake    Reviewed: Allergy & Precautions, H&P , NPO status , Patient's Chart, lab work & pertinent test results  History of Anesthesia Complications Negative for: history of anesthetic complications  Airway Mallampati: II  TM Distance: >3 FB Neck ROM: full    Dental no notable dental hx.    Pulmonary former smoker,    Pulmonary exam normal breath sounds clear to auscultation       Cardiovascular hypertension, On Medications Normal cardiovascular exam Rhythm:Regular Rate:Normal     Neuro/Psych negative neurological ROS     GI/Hepatic negative GI ROS, Neg liver ROS,   Endo/Other  diabetes, Well Controlled, Type 2  Renal/GU   negative genitourinary   Musculoskeletal   Abdominal   Peds  Hematology negative hematology ROS (+)   Anesthesia Other Findings   Reproductive/Obstetrics negative OB ROS                           Anesthesia Physical Anesthesia Plan  ASA: II  Anesthesia Plan: General   Post-op Pain Management:    Induction:   PONV Risk Score and Plan:   Airway Management Planned:   Additional Equipment:   Intra-op Plan:   Post-operative Plan:   Informed Consent: I have reviewed the patients History and Physical, chart, labs and discussed the procedure including the risks, benefits and alternatives for the proposed anesthesia with the patient or authorized representative who has indicated his/her understanding and acceptance.       Plan Discussed with:   Anesthesia Plan Comments:         Anesthesia Quick Evaluation

## 2018-03-28 ENCOUNTER — Other Ambulatory Visit: Payer: Medicare Other

## 2018-03-28 DIAGNOSIS — C61 Malignant neoplasm of prostate: Secondary | ICD-10-CM

## 2018-03-29 ENCOUNTER — Encounter: Payer: Self-pay | Admitting: Gastroenterology

## 2018-03-29 LAB — PSA: Prostate Specific Ag, Serum: 2 ng/mL (ref 0.0–4.0)

## 2018-03-30 NOTE — Progress Notes (Incomplete)
04/03/2018  3:14 PM   Tyler Morgan Jul 17, 1945 [id number]  Referring provider: Guadalupe Maple, MD [address] [place], [place] [address]  No chief complaint on file.   HPI: Tyler Morgan is a 73 y.o. 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  There was also incidental extremely low volume of prostate adenocarcinoma, Gleason 3+3.  He has a personal history of elevated PSA, and has undergone biopsy in the past (~10 years ago) with Dr. Bernardo Heater, which was negative (records not available).  ***  PSA Trend  07/21/2015, 12.3  10/11/2016,  8.5  09/19/2017,  0.9  03/28/2018,  2.0  PMH: Past Medical History:  Diagnosis Date   Diabetes mellitus without complication [facility])    ED (erectile dysfunction)    Hypertension    Motion sickness    boats   Neuropathy    Prostate enlargement    Urinary retention     Surgical History: Past Surgical History:  Procedure Laterality Date   BASAL CELL CARCINOMA EXCISION     nose    CATARACT EXTRACTION Bilateral 1986, 2014   COLONOSCOPY WITH PROPOFOL N/A 03/27/2018   Procedure: COLONOSCOPY WITH BIOPSIES;  Surgeon: Lin Landsman, MD;  Location: [place];  Service: Endoscopy;  Laterality: N/A;  Diabetic - oral meds   EYE SURGERY Bilateral [phone]   FRACTURE SURGERY Right    as a child/ right arm   HERNIA REPAIR Right 01-13-14   inguinal hernia   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 N/A 02/07/2017   Procedure: 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;  Surgeon: Hollice Espy, MD;  Location: [facility];  Service: Urology;  Laterality: N/A;   POLYPECTOMY N/A 03/27/2018   Procedure: POLYPECTOMY;  Surgeon: Lin Landsman, MD;   Location: [place];  Service: Endoscopy;  Laterality: N/A;   RETINAL DETACHMENT SURGERY  Jan 2015   TONSILLECTOMY  1955    Home Medications:  Allergies as of 04/03/2018      Reactions   Acetaminophen Swelling, Other (See Comments)   Swelling of liver    Amlodipine Swelling      Medication List       Accurate as of March 30, 2018  3:14 PM. Always use your most recent med list.        atenolol 50 MG tablet Commonly known as:  TENORMIN Take 1 tablet (50 mg total) by mouth daily.   atorvastatin 20 MG tablet Commonly known as:  LIPITOR Take 1 tablet (20 mg total) by mouth daily.   benazepril 40 MG tablet Commonly known as:  LOTENSIN TAKE ONE TABLET BY MOUTH EVERY DAY   hydrochlorothiazide 25 MG tablet Commonly known as:  HYDRODIURIL TAKE ONE TABLET BY MOUTH EVERY DAY   hydrocortisone 2.5 % rectal cream Commonly known as:  ANUSOL-HC Place 1 application rectally 2 (two) times daily.   ibuprofen 200 MG tablet Commonly known as:  ADVIL,MOTRIN Take 200 mg by mouth every 6 (six) hours as needed for headache or moderate pain.   metFORMIN 500 MG tablet Commonly known as:  GLUCOPHAGE TAKE TWO TABLETS BY MOUTH TWICE DAILY WITH A MEAL   miconazole 2 % cream Commonly known as:  MICOTIN Apply 1 application topically daily.   TURMERIC PO Take 1 capsule by  mouth daily.   Vitamin D-3 25 MCG (1000 UT) Caps Take 2,000 Units by mouth daily.       Allergies:  Allergies  Allergen Reactions   Acetaminophen Swelling and Other (See Comments)    Swelling of liver    Amlodipine Swelling    Family History: Family History  Problem Relation Age of Onset   Colon cancer Father    Angina Father    Macular degeneration Father    Alzheimer's disease Mother    Prostate cancer Neg Hx    Bladder Cancer Neg Hx    Kidney cancer Neg Hx     Social History:  reports that he quit smoking about 34 years ago. His smoking use included cigarettes. He has a 13.00  pack-year smoking history. He has never used smokeless tobacco. He reports current alcohol use of about 2.0 standard drinks of alcohol per week. He reports that he does not use drugs.  ROS:                                        Physical Exam: There were no vitals taken for this visit.  Constitutional:  Well nourished. Alert and oriented, No acute distress. {HEENT: [place] AT, moist mucus membranes.  Trachea midline, no masses.} Cardiovascular: No clubbing, cyanosis, or edema. Respiratory: Normal respiratory effort, no increased work of breathing. {GI: Abdomen is soft, non tender, non distended, no abdominal masses. Liver and spleen not palpable.  No hernias appreciated.  Stool sample for occult testing is not indicated.} {GU: No CVA tenderness.  No bladder fullness or masses.  Patient with circumcised/uncircumcised phallus. ***Foreskin easily retracted***  Urethral meatus is patent.  No penile discharge. No penile lesions or rashes. Scrotum without lesions, cysts, rashes and/or edema.  Testicles are located scrotally bilaterally. No masses are appreciated in the testicles. Left and right epididymis are normal.} {Rectal: Patient with normal sphincter tone.  No rectal masses are appreciated. Prostate is approximately *** grams, *** nodules are appreciated.} Skin: No rashes, bruises or suspicious lesions. {Lymph: No cervical or inguinal adenopathy.} Neurologic: Grossly intact, no focal deficits, moving all 4 extremities. Psychiatric: Normal mood and affect.  Laboratory Data: Lab Results  Component Value Date   WBC 4.6 12/13/2017   HGB 14.1 12/13/2017   HCT 41.9 12/13/2017   MCV 86 12/13/2017   PLT 221 12/13/2017    Lab Results  Component Value Date   CREATININE 0.71 (L) 12/13/2017   Results for Morgan, Tyler (MRN [id number]) as of 03/30/2018 15:18  Ref. Range 07/21/2015 08:27 10/11/2016 15:56 09/19/2017 13:49 03/28/2018 15:55  Prostate Specific Ag, Serum Latest Ref  Range: 0.0 - 4.0 ng/mL 12.3 (H) 8.5 (H) 0.9 2.0   Lab Results  Component Value Date   HGBA1C 6.9 12/13/2017    Urinalysis    Component Value Date/Time   COLORURINE AMBER (A) 12/23/2016 0850   APPEARANCEUR Clear 12/13/2017 1517   LABSPEC 1.036 (H) 12/23/2016 0850   PHURINE 5.0 12/23/2016 0850   GLUCOSEU 2+ (A) 12/13/2017 1517   HGBUR SMALL (A) 12/23/2016 0850   BILIRUBINUR Negative 12/13/2017 1517   KETONESUR 20 (A) 12/23/2016 0850   PROTEINUR Negative 12/13/2017 1517   PROTEINUR NEGATIVE 12/23/2016 0850   NITRITE Negative 12/13/2017 1517   NITRITE POSITIVE (A) 12/23/2016 0850   LEUKOCYTESUR Negative 12/13/2017 1517    Lab Results  Component Value Date   LABMICR See below: 12/13/2017  WBCUA None seen 12/13/2017   RBCUA 0-2 12/13/2017   LABEPIT 0-10 12/13/2017   BACTERIA Few 12/13/2017   Assessment & Plan:    1. Benign prostatic hyperplasia with urinary frequency - As of 09/25/2017, patient was doing extremely well from urinary standpoint and was pleased with voiding symptoms overall - Patient was able to stop finasteride  2. Prostate cancer ([place]) *** - Continue to monitor with serial PSA - ***  3. Stress incontinence of urine - Mild stress urinary incontinence exacerbated by activity - Not unanticipated, given volume of prostate tissue resected - Patient has been referred to physical therapy, as he would likely benefit from pelvic floor therapy - ***  No follow-ups on file.  [facility] [address], [place], [place] [address] [phone]  I, Adele Schilder, am acting as a scribe for Hollice Espy, MD.    {Add Scribe Attestation Statement}

## 2018-04-01 ENCOUNTER — Encounter: Payer: Self-pay | Admitting: Gastroenterology

## 2018-04-03 ENCOUNTER — Ambulatory Visit: Payer: Medicare Other | Admitting: Urology

## 2018-04-24 ENCOUNTER — Ambulatory Visit: Payer: Medicare Other | Admitting: Urology

## 2018-04-24 ENCOUNTER — Encounter: Payer: Self-pay | Admitting: Urology

## 2018-04-24 VITALS — BP 115/73 | HR 69 | Ht 73.0 in | Wt 196.0 lb

## 2018-04-24 DIAGNOSIS — C61 Malignant neoplasm of prostate: Secondary | ICD-10-CM

## 2018-04-24 DIAGNOSIS — Z87438 Personal history of other diseases of male genital organs: Secondary | ICD-10-CM | POA: Diagnosis not present

## 2018-04-24 NOTE — Progress Notes (Signed)
04/24/2018 3:47 PM   Tyler Morgan 18-Jul-1945 [id number]  Referring provider: Guadalupe Maple, MD [address] [place], [place] [address]  Chief Complaint  Patient presents with  . Benign Prostatic Hypertrophy    6 month f/u    HPI: 73 year old male with a personal history of massive BPH S/p laser nucleation of the prostate 01/2017 with incidental low risk prostate cancer returns today for surveillance.  A total of 114 g were resected. 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  He does have a personal history of elevated PSA. He is undergone biopsy in the past which was negative (~10 years ago with Dr. Bernardo Heater, records not available). Preop PSA 8.5 on 10/11/16.     Most recent PSA 2.0 (off finasteride) on 03/2018 up from post op PSA of 0.9 (on finsteride)  In terms of urination, he continues to do exceptionally well.  He has excellent stream.  No hesitancy.  No sensation of incomplete emptying.  No dysuria or gross hematuria.  No SUI.    PMH: Past Medical History:  Diagnosis Date  . Diabetes mellitus without complication ([place])   . ED (erectile dysfunction)   . Hypertension   . Motion sickness    boats  . Neuropathy   . Prostate enlargement   . Urinary retention     Surgical History: Past Surgical History:  Procedure Laterality Date  . BASAL CELL CARCINOMA EXCISION     nose   . CATARACT EXTRACTION Bilateral 1986, 2014  . COLONOSCOPY WITH PROPOFOL N/A 03/27/2018   Procedure: COLONOSCOPY WITH BIOPSIES;  Surgeon: Lin Landsman, MD;  Location: [place];  Service: Endoscopy;  Laterality: N/A;  Diabetic - oral meds  . EYE SURGERY Bilateral [id number]  . FRACTURE SURGERY Right    as a child/ right arm  . HERNIA REPAIR Right 01-13-14   inguinal hernia  . 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 POLYPECTOMY N/A 03/27/2018   Procedure: POLYPECTOMY;  Surgeon: Lin Landsman, MD;  Location: [place];  Service: Endoscopy;  Laterality: N/A;  . RETINAL DETACHMENT SURGERY  Jan 2015  . TONSILLECTOMY  1955    Home Medications:  Allergies as of 04/24/2018      Reactions   Acetaminophen Swelling, Other (See Comments)   Swelling of liver    Amlodipine Swelling      Medication List       Accurate as of April 24, 2018 11:59 PM. Always use your most recent med list.        atenolol 50 MG tablet Commonly known as:  TENORMIN Take 1 tablet (50 mg total) by mouth daily.   atorvastatin 20 MG tablet Commonly known as:  LIPITOR Take 1 tablet (20 mg total) by mouth daily.   benazepril 40 MG tablet Commonly known as:  LOTENSIN TAKE ONE TABLET BY MOUTH EVERY DAY   hydrochlorothiazide 25 MG tablet Commonly known as:  HYDRODIURIL TAKE ONE TABLET BY MOUTH EVERY DAY   ibuprofen 200 MG tablet Commonly known as:  ADVIL,MOTRIN Take 200 mg by mouth every 6 (six) hours as needed for headache or moderate pain.   metFORMIN 500 MG tablet Commonly known as:  GLUCOPHAGE TAKE TWO TABLETS BY MOUTH TWICE DAILY WITH A MEAL  TURMERIC PO Take 1 capsule by mouth daily.   Vitamin D-3 25 MCG (1000 UT) Caps Take 2,000 Units by mouth daily.       Allergies:  Allergies  Allergen Reactions  . Acetaminophen Swelling and Other (See Comments)    Swelling of liver   . Amlodipine Swelling    Family History: Family History  Problem Relation Age of Onset  . Colon cancer Father   . Angina Father   . Macular degeneration Father   . Alzheimer's disease Mother   . Prostate cancer Neg Hx   . Bladder Cancer Neg Hx   . Kidney cancer Neg Hx     Social History:  reports that he quit smoking about 34 years ago. His smoking use  included cigarettes. He has a 13.00 pack-year smoking history. He has never used smokeless tobacco. He reports current alcohol use of about 2.0 standard drinks of alcohol per week. He reports that he does not use drugs.  ROS: UROLOGY Frequent Urination?: No Hard to postpone urination?: No Burning/pain with urination?: No Get up at night to urinate?: No Leakage of urine?: No Urine stream starts and stops?: No Trouble starting stream?: No Do you have to strain to urinate?: No Blood in urine?: No Urinary tract infection?: No Sexually transmitted disease?: No Injury to kidneys or bladder?: No Painful intercourse?: No Weak stream?: No Erection problems?: No Penile pain?: No  Gastrointestinal Nausea?: No Vomiting?: No Indigestion/heartburn?: No Diarrhea?: No Constipation?: No  Constitutional Fever: No Night sweats?: No Weight loss?: No Fatigue?: No  Skin Skin rash/lesions?: No Itching?: No  Eyes Blurred vision?: No Double vision?: No  Ears/Nose/Throat Sore throat?: No Sinus problems?: No  Hematologic/Lymphatic Swollen glands?: No Easy bruising?: No  Cardiovascular Leg swelling?: No Chest pain?: No  Respiratory Cough?: No Shortness of breath?: No  Endocrine Excessive thirst?: No  Musculoskeletal Back pain?: No Joint pain?: No  Neurological Headaches?: No Dizziness?: No  Psychologic Depression?: No Anxiety?: No  Physical Exam: BP 115/73   Pulse 69   Ht 6\' 1"  (1.854 m)   Wt 196 lb (88.9 kg)   BMI 25.86 kg/m   Constitutional:  Alert and oriented, No acute distress. HEENT: [place] AT, moist mucus membranes.  Trachea midline, no masses. Cardiovascular: No clubbing, cyanosis, or edema. Respiratory: Normal respiratory effort, no increased work of breathing. Rectal: Normal sphincter tone.  Enlarged gland, nonnodular, nontender.  Skin: No rashes, bruises or suspicious lesions.  Neurologic: Grossly intact, no focal deficits, moving all 4  extremities. Psychiatric: Normal mood and affect.  Laboratory Data: Lab Results  Component Value Date   WBC 4.6 12/13/2017   HGB 14.1 12/13/2017   HCT 41.9 12/13/2017   MCV 86 12/13/2017   PLT 221 12/13/2017    Lab Results  Component Value Date   CREATININE 0.71 (L) 12/13/2017   PSA trend as above  Lab Results  Component Value Date   HGBA1C 6.9 12/13/2017    Results for orders placed or performed in visit on 03/28/18  PSA  Result Value Ref Range   Prostate Specific Ag, Serum 2.0 0.0 - 4.0 ng/mL   Assessment & Plan:     1. Prostate cancer ([place]) Incidental low volume low risk prostate cancer at time of holep PSA has remained essentially stable since coming off finasteride, anticipated doubling after stopping this medication which was realized We will continue to follow PSA at least every 6 months, 46-month lab only in 12 months PSA DRE Patient is agreeable this plan -  PSA; Future  2. History of BPH Symptoms stable status post holep   Return for 6 months lab only for PSA, 12 months MD visit with PSA/ DRE.  Hollice Espy, MD  [facility] [address], [place], [place] [address] [phone]

## 2018-06-26 ENCOUNTER — Ambulatory Visit: Payer: Medicare Other | Admitting: Family Medicine

## 2018-07-16 ENCOUNTER — Other Ambulatory Visit: Payer: Medicare Other

## 2018-07-16 ENCOUNTER — Telehealth: Payer: Self-pay | Admitting: Urology

## 2018-07-16 ENCOUNTER — Other Ambulatory Visit: Payer: Self-pay

## 2018-07-16 DIAGNOSIS — R350 Frequency of micturition: Secondary | ICD-10-CM | POA: Diagnosis not present

## 2018-07-16 LAB — MICROSCOPIC EXAMINATION: WBC, UA: >30 /hpf — AB (ref 0–5)

## 2018-07-16 LAB — URINALYSIS, COMPLETE
Bilirubin, UA: NEGATIVE
Ketones, UA: NEGATIVE
Nitrite, UA: NEGATIVE
Protein,UA: NEGATIVE
Specific Gravity, UA: 1.01 (ref 1.005–1.030)
Urobilinogen, Ur: 0.2 mg/dL (ref 0.2–1.0)
pH, UA: 5.5 (ref 5.0–7.5)

## 2018-07-16 MED ORDER — SULFAMETHOXAZOLE-TRIMETHOPRIM 800-160 MG PO TABS: 1.0000 | ORAL_TABLET | Freq: Two times a day (BID) | ORAL | 0 refills | Status: DC

## 2018-07-16 NOTE — Progress Notes (Signed)
Patient dropped of UA today complaining of urinary frequency and dysuria. Per Dr. Diamantina Providence patient was notified that urine  Does look positive for infection Bactrim sent into pharmacy and urine sent for culture

## 2018-07-16 NOTE — Telephone Encounter (Signed)
Patient called the office today with symptoms of difficulty urinating, increased frequency, low grade fever (that has resolved), and believes that he may have a UTI.   I added him to the lab schedule for today to provide a urine sample for urinalysis and culture.

## 2018-07-19 LAB — CULTURE, URINE COMPREHENSIVE

## 2018-07-25 ENCOUNTER — Ambulatory Visit (INDEPENDENT_AMBULATORY_CARE_PROVIDER_SITE_OTHER): Payer: Medicare Other | Admitting: Family Medicine

## 2018-07-25 ENCOUNTER — Encounter: Payer: Self-pay | Admitting: Family Medicine

## 2018-07-25 ENCOUNTER — Other Ambulatory Visit: Payer: Self-pay

## 2018-07-25 DIAGNOSIS — E1365 Other specified diabetes mellitus with hyperglycemia: Secondary | ICD-10-CM | POA: Diagnosis not present

## 2018-07-25 DIAGNOSIS — IMO0002 Reserved for concepts with insufficient information to code with codable children: Secondary | ICD-10-CM

## 2018-07-25 DIAGNOSIS — J069 Acute upper respiratory infection, unspecified: Secondary | ICD-10-CM | POA: Diagnosis not present

## 2018-07-25 DIAGNOSIS — E7800 Pure hypercholesterolemia, unspecified: Secondary | ICD-10-CM | POA: Diagnosis not present

## 2018-07-25 DIAGNOSIS — E1349 Other specified diabetes mellitus with other diabetic neurological complication: Secondary | ICD-10-CM | POA: Diagnosis not present

## 2018-07-25 DIAGNOSIS — I10 Essential (primary) hypertension: Secondary | ICD-10-CM | POA: Diagnosis not present

## 2018-07-25 NOTE — Assessment & Plan Note (Signed)
The current medical regimen is effective;  continue present plan and medications.  

## 2018-07-25 NOTE — Progress Notes (Signed)
There were no vitals taken for this visit.   Subjective:    Patient ID: Tyler Morgan, male    DOB: 1945-12-29, 73 y.o.   MRN: [id number]  HPI: Tyler Morgan is a 73 y.o. male  Med check  Telemedicine using audio/video telecommunications for a synchronous communication visit. Today's visit due to COVID-19 isolation precautions I connected with and verified that I am speaking with the correct person using two identifiers.   I discussed the limitations, risks, security and privacy concerns of performing an evaluation and management service by telecommunication and the availability of in person appointments. I also discussed with the patient that there may be a patient responsible charge related to this service. The patient expressed understanding and agreed to proceed. The patient's location is home. I am at home.   Relevant past medical, surgical, family and social history reviewed and updated as indicated. Interim medical history since our last visit reviewed. Allergies and medications reviewed and updated.  Review of Systems  Constitutional: Negative.   Respiratory: Negative.   Cardiovascular: Negative.     Per HPI unless specifically indicated above     Objective:    There were no vitals taken for this visit.  Wt Readings from Last 3 Encounters:  04/24/18 196 lb (88.9 kg)  03/27/18 188 lb (85.3 kg)  03/04/18 198 lb 9.6 oz (90.1 kg)    Physical Exam  Results for orders placed or performed in visit on 07/16/18  CULTURE, URINE COMPREHENSIVE  Result Value Ref Range   Urine Culture, Comprehensive Final report (A)    Organism ID, Bacteria Proteus mirabilis (A)    ANTIMICROBIAL SUSCEPTIBILITY Comment   Microscopic Examination  Result Value Ref Range   WBC, UA >30 (A) 0 - 5 /hpf   RBC 0-2 0 - 2 /hpf   Epithelial Cells (non renal) 0-10 0 - 10 /hpf   Bacteria, UA Few None seen/Few  Urinalysis, Complete  Result Value Ref Range   Specific Gravity, UA 1.010  1.005 - 1.030   pH, UA 5.5 5.0 - 7.5   Color, UA Yellow Yellow   Appearance Ur Clear Clear   Leukocytes,UA Trace (A) Negative   Protein,UA Negative Negative/Trace   Glucose, UA 2+ (A) Negative   Ketones, UA Negative Negative   RBC, UA 2+ (A) Negative   Bilirubin, UA Negative Negative   Urobilinogen, Ur 0.2 0.2 - 1.0 mg/dL   Nitrite, UA Negative Negative   Microscopic Examination See below:       Assessment & Plan:   Problem List Items Addressed This Visit      Cardiovascular and Mediastinum   Hypertension    The current medical regimen is effective;  continue present plan and medications.       Relevant Orders   Basic metabolic panel     Endocrine   DM (diabetes mellitus), secondary, uncontrolled, w/neurologic complic [facility])    The current medical regimen is effective;  continue present plan and medications.       Relevant Orders   Bayer DCA Hb A1c Waived     Other   Hypercholesteremia    The current medical regimen is effective;  continue present plan and medications.       Relevant Orders   LP+ALT+AST [place], [place]    Other Visit Diagnoses    Upper respiratory tract infection, unspecified type    -  Primary   Relevant Orders   SAR CoV2 Serology (COVID 19)AB(IGG)IA     I  discussed the assessment and treatment plan with the patient. The patient was provided an opportunity to ask questions and all were answered. The patient agreed with the plan and demonstrated an understanding of the instructions.   The patient was advised to call back or seek an in-person evaluation if the symptoms worsen or if the condition fails to improve as anticipated.   I provided 21+ minutes of time during this encounter.  Follow up plan: Return in about 6 months (around 01/24/2019) for Physical Exam, Hemoglobin A1c.

## 2018-07-30 ENCOUNTER — Other Ambulatory Visit: Payer: Medicare Other

## 2018-07-30 ENCOUNTER — Other Ambulatory Visit: Payer: Self-pay

## 2018-07-30 DIAGNOSIS — E1349 Other specified diabetes mellitus with other diabetic neurological complication: Secondary | ICD-10-CM | POA: Diagnosis not present

## 2018-07-30 DIAGNOSIS — IMO0002 Reserved for concepts with insufficient information to code with codable children: Secondary | ICD-10-CM

## 2018-07-30 DIAGNOSIS — J069 Acute upper respiratory infection, unspecified: Secondary | ICD-10-CM | POA: Diagnosis not present

## 2018-07-30 DIAGNOSIS — E7800 Pure hypercholesterolemia, unspecified: Secondary | ICD-10-CM

## 2018-07-30 DIAGNOSIS — I10 Essential (primary) hypertension: Secondary | ICD-10-CM | POA: Diagnosis not present

## 2018-07-30 DIAGNOSIS — E1365 Other specified diabetes mellitus with hyperglycemia: Secondary | ICD-10-CM | POA: Diagnosis not present

## 2018-07-30 LAB — LP+ALT+AST PICCOLO, WAIVED
ALT (SGPT) Piccolo, Waived: 20 U/L (ref 10–47)
AST (SGOT) Piccolo, Waived: 14 U/L (ref 11–38)
Chol/HDL Ratio Piccolo,Waive: 3.5 mg/dL
Cholesterol Piccolo, Waived: 131 mg/dL (ref <200)
HDL Chol Piccolo, Waived: 37 mg/dL — ABNORMAL LOW (ref >59)
LDL Chol Calc Piccolo Waived: 70 mg/dL (ref <100)
Triglycerides Piccolo,Waived: 121 mg/dL (ref <150)
VLDL Chol Calc Piccolo,Waive: 24 mg/dL (ref <30)

## 2018-07-30 LAB — BAYER DCA HB A1C WAIVED: HB A1C (BAYER DCA - WAIVED): 8.5 % — ABNORMAL HIGH (ref <7.0)

## 2018-07-31 ENCOUNTER — Other Ambulatory Visit: Payer: Self-pay | Admitting: Family Medicine

## 2018-07-31 ENCOUNTER — Encounter: Payer: Self-pay | Admitting: Family Medicine

## 2018-07-31 DIAGNOSIS — E119 Type 2 diabetes mellitus without complications: Secondary | ICD-10-CM

## 2018-07-31 LAB — BASIC METABOLIC PANEL
BUN/Creatinine Ratio: 21 (ref 10–24)
BUN: 16 mg/dL (ref 8–27)
CO2: 23 mmol/L (ref 20–29)
Calcium: 9.2 mg/dL (ref 8.6–10.2)
Chloride: 101 mmol/L (ref 96–106)
Creatinine, Ser: 0.75 mg/dL — ABNORMAL LOW (ref 0.76–1.27)
GFR calc Af Amer: 106 mL/min/{1.73_m2} (ref >59)
GFR calc non Af Amer: 92 mL/min/{1.73_m2} (ref >59)
Glucose: 219 mg/dL — ABNORMAL HIGH (ref 65–99)
Potassium: 4.4 mmol/L (ref 3.5–5.2)
Sodium: 141 mmol/L (ref 134–144)

## 2018-07-31 LAB — SAR COV2 SEROLOGY (COVID19)AB(IGG),IA: SARS-CoV-2 Ab, IgG: NEGATIVE

## 2018-07-31 NOTE — Telephone Encounter (Signed)
Please advise on refill request per pt he is taking 3 tablets daily this rx does not reflect that

## 2018-09-23 ENCOUNTER — Ambulatory Visit (INDEPENDENT_AMBULATORY_CARE_PROVIDER_SITE_OTHER): Payer: Medicare Other

## 2018-09-23 VITALS — BP 116/68 | HR 75

## 2018-09-23 DIAGNOSIS — Z0000 Encounter for general adult medical examination without abnormal findings: Secondary | ICD-10-CM

## 2018-09-23 NOTE — Progress Notes (Signed)
Subjective:   Tyler Morgan is a 73 y.o. male who presents for Medicare Annual/Subsequent preventive examination.  This visit is being conducted via phone call  - after an attmept to do on video chat - due to the COVID-19 pandemic. This patient has given me verbal consent via phone to conduct this visit, patient states they are participating from their home address. Some vital signs may be absent or patient reported.   Patient identification: identified by name, DOB, and current address.    Review of Systems:   Cardiac Risk Factors include: advanced age (>60men, >63 women);male gender;diabetes mellitus;hypertension     Objective:    Vitals: BP 116/68 Comment: pt reported  Pulse 75 Comment: pt reported  There is no height or weight on file to calculate BMI.  Advanced Directives 09/23/2018 03/27/2018 09/19/2017 02/07/2017 01/30/2017 12/23/2016 12/21/2016  Does Patient Have a Medical Advance Directive? Yes No No No No No No  Type of Advance Directive Living will;Healthcare Power of Attorney - - - - - -  Copy of [place] in Chart? No - copy requested - - - - - -  Would patient like information on creating a medical advance directive? - No - Patient declined Yes (MAU/Ambulatory/Procedural Areas - Information given) No - Patient declined - - -    Tobacco Social History   Tobacco Use  Smoking Status Former Smoker  . Packs/day: 1.00  . Years: 13.00  . Pack years: 13.00  . Types: Cigarettes  . Quit date: 02/21/1984  . Years since quitting: 34.6  Smokeless Tobacco Never Used     Counseling given: Not Answered   Clinical Intake:  Pre-visit preparation completed: Yes  Pain : No/denies pain     Nutritional Risks: None Diabetes: Yes CBG done?: No Did pt. bring in CBG monitor from home?: No  How often do you need to have someone help you when you read instructions, pamphlets, or other written materials from your doctor or pharmacy?: 1 - Never  Nutrition  Risk Assessment:  Has the patient had any N/V/D within the last 2 months?  No  Does the patient have any non-healing wounds?  No  Has the patient had any unintentional weight loss or weight gain?  No   Diabetes:  Is the patient diabetic?  Yes  If diabetic, was a CBG obtained today?  Yes checked at home, 171 30-40 minutes after eating  Did the patient bring in their glucometer from home?  No  How often do you monitor your CBG's? As needed.   Financial Strains and Diabetes Management:  Are you having any financial strains with the device, your supplies or your medication? No .  Does the patient want to be seen by Chronic Care Management for management of their diabetes?  No  Would the patient like to be referred to a Nutritionist or for Diabetic Management?  No   Diabetic Exams:  Diabetic Eye Exam: Overdue for diabetic eye exam. Pt has been advised about the importance in completing this exam  Diabetic Foot Exam:  Pt has been advised about the importance in completing this exam.  Patient will get at next in office visit   Interpreter Needed?: No  Information entered by :: Tiffany Hill,LPN  Past Medical History:  Diagnosis Date  . Diabetes mellitus without complication ([place])   . ED (erectile dysfunction)   . Hypertension   . Motion sickness    boats  . Neuropathy   . Prostate enlargement   .  Urinary retention    Past Surgical History:  Procedure Laterality Date  . BASAL CELL CARCINOMA EXCISION     nose   . CATARACT EXTRACTION Bilateral 1986, 2014  . COLONOSCOPY WITH PROPOFOL N/A 03/27/2018   Procedure: COLONOSCOPY WITH BIOPSIES;  Surgeon: Lin Landsman, MD;  Location: [facility];  Service: Endoscopy;  Laterality: N/A;  Diabetic - oral meds  . EYE SURGERY Bilateral [id number]  . FRACTURE SURGERY Right    as a child/ right arm  . HERNIA REPAIR Right 01-13-14   inguinal hernia  . 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 N/A 02/07/2017    Procedure: 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;  Surgeon: Hollice Espy, MD;  Location: [facility];  Service: Urology;  Laterality: N/A;  . POLYPECTOMY N/A 03/27/2018   Procedure: POLYPECTOMY;  Surgeon: Lin Landsman, MD;  Location: [facility];  Service: Endoscopy;  Laterality: N/A;  . RETINAL DETACHMENT SURGERY  Jan 2015  . TONSILLECTOMY  1955   Family History  Problem Relation Age of Onset  . Colon cancer Father   . Angina Father   . Macular degeneration Father   . Alzheimer's disease Mother   . Prostate cancer Neg Hx   . Bladder Cancer Neg Hx   . Kidney cancer Neg Hx    Social History   Socioeconomic History  . Marital status: Married    Spouse name: Not on file  . Number of children: Not on file  . Years of education: Not on file  . Highest education level: Bachelor's degree (e.g., BA, AB, BS)  Occupational History  . Not on file  Social Needs  . Financial resource strain: Not hard at all  . Food insecurity    Worry: Never true    Inability: Never true  . Transportation needs    Medical: No    Non-medical: No  Tobacco Use  . Smoking status: Former Smoker    Packs/day: 1.00    Years: 13.00    Pack years: 13.00    Types: Cigarettes    Quit date: 02/21/1984    Years since quitting: 34.6  . Smokeless tobacco: Never Used  Substance and Sexual Activity  . Alcohol use: Yes    Alcohol/week: 2.0 standard drinks    Types: 2 Cans of beer per week    Comment: wine occasionally   . Drug use: No  . Sexual activity: Not on file  Lifestyle  . Physical activity    Days per week: 0 days    Minutes per session: 0 min  . Stress: Not at all  Relationships  . Social connections    Talks on phone: More than three times a week    Gets together: More than three times a week    Attends religious service: Never    Active member of club or organization: Yes    Attends meetings of clubs or organizations: More than 4 times per year    Relationship  status: Married  Other Topics Concern  . Not on file  Social History Narrative   Working fulltime     Outpatient Encounter Medications as of 09/23/2018  Medication Sig  . atenolol (TENORMIN) 50 MG tablet Take 1 tablet (50 mg total) by mouth daily.  Marland Kitchen atorvastatin (LIPITOR) 20 MG tablet Take 1 tablet (20 mg total) by mouth daily.  . benazepril (LOTENSIN) 40 MG tablet TAKE ONE TABLET BY MOUTH EVERY DAY  . Cholecalciferol (VITAMIN D-3) 1000 units CAPS Take 2,000  Units by mouth daily.   . hydrochlorothiazide (HYDRODIURIL) 25 MG tablet TAKE ONE TABLET BY MOUTH EVERY DAY  . metFORMIN (GLUCOPHAGE) 500 MG tablet Take 1 tablet (500 mg total) by mouth 3 (three) times daily.  . TURMERIC PO Take 1 capsule by mouth daily.   . [DISCONTINUED] ibuprofen (ADVIL,MOTRIN) 200 MG tablet Take 200 mg by mouth every 6 (six) hours as needed for headache or moderate pain.   No facility-administered encounter medications on file as of 09/23/2018.     Activities of Daily Living In your present state of health, do you have any difficulty performing the following activities: 09/23/2018 03/27/2018  Hearing? N N  Comment no hearing aids -  Vision? N N  Comment eyeglasses -  Difficulty concentrating or making decisions? N N  Walking or climbing stairs? N N  Dressing or bathing? N N  Doing errands, shopping? N -  Preparing Food and eating ? N -  Using the Toilet? N -  In the past six months, have you accidently leaked urine? N -  Do you have problems with loss of bowel control? N -  Managing your Medications? N -  Managing your Finances? N -  Housekeeping or managing your Housekeeping? N -  Some recent data might be hidden    Patient Care Team: Guadalupe Maple, MD as PCP - General (Family Medicine) Guadalupe Maple, MD (Family Medicine) Christene Lye, MD (General Surgery) Schnier, Dolores Lory, MD (Vascular Surgery)   Assessment:   This is a routine wellness examination for [place].  Exercise Activities  and Dietary recommendations Current Exercise Habits: The patient does not participate in regular exercise at present, Exercise limited by: None identified  Goals    . DIET - INCREASE WATER INTAKE     Recommend drinking at least 6-8 glasses of water a day     . Increase water intake     Recommend drinking at least 5-6 glasses of water a day        Fall Risk Fall Risk  09/23/2018 09/19/2017 07/30/2017 07/30/2017 01/26/2017  Falls in the past year? 0 Yes No No No  Number falls in past yr: - 1 - - -  Injury with Fall? - No - - -  Follow up - - - - -   [place]:  Any stairs in or around the home? Yes  If so, are there any without handrails? No   Home free of loose throw rugs in walkways, pet beds, electrical cords, etc? Yes  Adequate lighting in your home to reduce risk of falls? Yes   ASSISTIVE DEVICES UTILIZED TO PREVENT FALLS:  Life alert? No  Use of a cane, walker or w/c? No  Grab bars in the bathroom? No  Shower chair or bench in shower? No  Elevated toilet seat or a handicapped toilet? No    TIMED UP AND GO:  Unable to perform    Depression Screen PHQ 2/9 Scores 09/23/2018 09/19/2017 07/30/2017 01/26/2017  PHQ - 2 Score 0 0 0 0    Cognitive Function     6CIT Screen 09/19/2017 09/14/2016  What Year? 0 points 0 points  What month? 0 points 0 points  What time? 0 points 0 points  Count back from 20 0 points 0 points  Months in reverse 0 points 0 points  Repeat phrase 0 points 2 points  Total Score 0 2    Immunization History  Administered Date(s) Administered  .  Influenza, High Dose Seasonal PF 11/09/2015, 12/15/2016, 12/13/2017  . Influenza,inj,Quad PF,6+ Mos 12/15/2014  . Influenza-Unspecified 12/19/2016  . Pneumococcal Conjugate-13 05/13/2014  . Pneumococcal Polysaccharide-23 07/21/2015  . Pneumococcal-Unspecified 01/09/2008  . Tdap 09/02/2009  . Zoster 01/09/2008    Qualifies for Shingles Vaccine? Yes  Zostavax completed  2009. Due for Shingrix. Education has been provided regarding the importance of this vaccine. Pt has been advised to call insurance company to determine out of pocket expense. Advised may also receive vaccine at local pharmacy or Health Dept. Verbalized acceptance and understanding.  Tdap: up to date   Flu Vaccine: up to date   Pneumococcal Vaccine: up to date   Screening Tests Health Maintenance  Topic Date Due  . OPHTHALMOLOGY EXAM  08/26/2016  . FOOT EXAM  02/21/2018  . INFLUENZA VACCINE  09/21/2018  . HEMOGLOBIN A1C  01/29/2019  . TETANUS/TDAP  09/03/2019  . COLONOSCOPY  03/28/2023  . Hepatitis C Screening  Completed  . PNA vac Low Risk Adult  Completed   Cancer Screenings:  Colorectal Screening: Completed 03/27/2018. Repeat every 5 years  Lung Cancer Screening: (Low Dose CT Chest recommended if Age 16-80 years, 30 pack-year currently smoking OR have quit w/in 15years.) does not qualify.     Additional Screening:  Hepatitis C Screening: does qualify; Completed 06/23/2015  Dental Screening: Recommended annual dental exams for proper oral hygiene  Community Resource Referral:  CRR required this visit?  No       Plan:  I have personally reviewed and addressed the Medicare Annual Wellness questionnaire and have noted the following in the patient's chart:  A. Medical and social history B. Use of alcohol, tobacco or illicit drugs  C. Current medications and supplements D. Functional ability and status E.  Nutritional status F.  Physical activity G. Advance directives H. List of other physicians I.  Hospitalizations, surgeries, and ER visits in previous 12 months J.  [place] such as hearing and vision if needed, cognitive and depression L. Referrals and appointments   In addition, I have reviewed and discussed with patient certain preventive protocols, quality metrics, and best practice recommendations. A written personalized care plan for preventive  services as well as general preventive health recommendations were provided to patient.   Signed,   Bevelyn Ngo, LPN  09/20/8588 Nurse Health Advisor  Nurse Notes: none

## 2018-09-23 NOTE — Patient Instructions (Signed)
[name] , Thank you for taking time to come for your Medicare Wellness Visit. I appreciate your ongoing commitment to your health goals. Please review the following plan we discussed and let me know if I can assist you in the future.   Screening recommendations/referrals: Colonoscopy: completed 03/2018 Recommended yearly ophthalmology/optometry visit for glaucoma screening and checkup Recommended yearly dental visit for hygiene and checkup  Vaccinations: Influenza vaccine: up to date Pneumococcal vaccine: up to date Tdap vaccine: up to date Shingles vaccine: shingrix eligible, check with your insurance company for coverage     Advanced directives: Please bring a copy of your health care power of attorney and living will to the office at your convenience.  Conditions/risks identified: diabetic, discussed chronic care management team   Next appointment: Follow up in one year for your annual wellness exam.   Preventive Care 65 Years and Older, Male Preventive care refers to lifestyle choices and visits with your health care provider that can promote health and wellness. What does preventive care include?  A yearly physical exam. This is also called an annual well check.  Dental exams once or twice a year.  Routine eye exams. Ask your health care provider how often you should have your eyes checked.  Personal lifestyle choices, including:  Daily care of your teeth and gums.  Regular physical activity.  Eating a healthy diet.  Avoiding tobacco and drug use.  Limiting alcohol use.  Practicing safe sex.  Taking low doses of aspirin every day.  Taking vitamin and mineral supplements as recommended by your health care provider. What happens during an annual well check? The services and screenings done by your health care provider during your annual well check will depend on your age, overall health, lifestyle risk factors, and family history of disease. Counseling  Your  health care provider may ask you questions about your:  Alcohol use.  Tobacco use.  Drug use.  Emotional well-being.  Home and relationship well-being.  Sexual activity.  Eating habits.  History of falls.  Memory and ability to understand (cognition).  Work and work Statistician. Screening  You may have the following tests or measurements:  Height, weight, and BMI.  Blood pressure.  Lipid and cholesterol levels. These may be checked every 5 years, or more frequently if you are over 50 years old.  Skin check.  Lung cancer screening. You may have this screening every year starting at age 68 if you have a 30-pack-year history of smoking and currently smoke or have quit within the past 15 years.  Fecal occult blood test (FOBT) of the stool. You may have this test every year starting at age 82.  Flexible sigmoidoscopy or colonoscopy. You may have a sigmoidoscopy every 5 years or a colonoscopy every 10 years starting at age 21.  Prostate cancer screening. Recommendations will vary depending on your family history and other risks.  Hepatitis C blood test.  Hepatitis B blood test.  Sexually transmitted disease (STD) testing.  Diabetes screening. This is done by checking your blood sugar (glucose) after you have not eaten for a while (fasting). You may have this done every 1-3 years.  Abdominal aortic aneurysm (AAA) screening. You may need this if you are a current or former smoker.  Osteoporosis. You may be screened starting at age 27 if you are at high risk. Talk with your health care provider about your test results, treatment options, and if necessary, the need for more tests. Vaccines  Your health care  provider may recommend certain vaccines, such as:  Influenza vaccine. This is recommended every year.  Tetanus, diphtheria, and acellular pertussis (Tdap, Td) vaccine. You may need a Td booster every 10 years.  Zoster vaccine. You may need this after age 14.   Pneumococcal 13-valent conjugate (PCV13) vaccine. One dose is recommended after age 59.  Pneumococcal polysaccharide (PPSV23) vaccine. One dose is recommended after age 63. Talk to your health care provider about which screenings and vaccines you need and how often you need them. This information is not intended to replace advice given to you by your health care provider. Make sure you discuss any questions you have with your health care provider. Document Released: 03/05/2015 Document Revised: 10/27/2015 Document Reviewed: 12/08/2014 Elsevier Interactive Patient Education  2017 [place] Prevention in the Home Falls can cause injuries. They can happen to people of all ages. There are many things you can do to make your home safe and to help prevent falls. What can I do on the outside of my home?  Regularly fix the edges of walkways and driveways and fix any cracks.  Remove anything that might make you trip as you walk through a door, such as a raised step or threshold.  Trim any bushes or trees on the path to your home.  Use bright outdoor lighting.  Clear any walking paths of anything that might make someone trip, such as rocks or tools.  Regularly check to see if handrails are loose or broken. Make sure that both sides of any steps have handrails.  Any raised decks and porches should have guardrails on the edges.  Have any leaves, snow, or ice cleared regularly.  Use sand or salt on walking paths during winter.  Clean up any spills in your garage right away. This includes oil or grease spills. What can I do in the bathroom?  Use night lights.  Install grab bars by the toilet and in the tub and shower. Do not use towel bars as grab bars.  Use non-skid mats or decals in the tub or shower.  If you need to sit down in the shower, use a plastic, non-slip stool.  Keep the floor dry. Clean up any water that spills on the floor as soon as it happens.  Remove soap  buildup in the tub or shower regularly.  Attach bath mats securely with double-sided non-slip rug tape.  Do not have throw rugs and other things on the floor that can make you trip. What can I do in the bedroom?  Use night lights.  Make sure that you have a light by your bed that is easy to reach.  Do not use any sheets or blankets that are too big for your bed. They should not hang down onto the floor.  Have a firm chair that has side arms. You can use this for support while you get dressed.  Do not have throw rugs and other things on the floor that can make you trip. What can I do in the kitchen?  Clean up any spills right away.  Avoid walking on wet floors.  Keep items that you use a lot in easy-to-reach places.  If you need to reach something above you, use a strong step stool that has a grab bar.  Keep electrical cords out of the way.  Do not use floor polish or wax that makes floors slippery. If you must use wax, use non-skid floor wax.  Do not have throw  rugs and other things on the floor that can make you trip. What can I do with my stairs?  Do not leave any items on the stairs.  Make sure that there are handrails on both sides of the stairs and use them. Fix handrails that are broken or loose. Make sure that handrails are as long as the stairways.  Check any carpeting to make sure that it is firmly attached to the stairs. Fix any carpet that is loose or worn.  Avoid having throw rugs at the top or bottom of the stairs. If you do have throw rugs, attach them to the floor with carpet tape.  Make sure that you have a light switch at the top of the stairs and the bottom of the stairs. If you do not have them, ask someone to add them for you. What else can I do to help prevent falls?  Wear shoes that:  Do not have high heels.  Have rubber bottoms.  Are comfortable and fit you well.  Are closed at the toe. Do not wear sandals.  If you use a stepladder:  Make  sure that it is fully opened. Do not climb a closed stepladder.  Make sure that both sides of the stepladder are locked into place.  Ask someone to hold it for you, if possible.  Clearly mark and make sure that you can see:  Any grab bars or handrails.  First and last steps.  Where the edge of each step is.  Use tools that help you move around (mobility aids) if they are needed. These include:  Canes.  Walkers.  Scooters.  Crutches.  Turn on the lights when you go into a dark area. Replace any light bulbs as soon as they burn out.  Set up your furniture so you have a clear path. Avoid moving your furniture around.  If any of your floors are uneven, fix them.  If there are any pets around you, be aware of where they are.  Review your medicines with your doctor. Some medicines can make you feel dizzy. This can increase your chance of falling. Ask your doctor what other things that you can do to help prevent falls. This information is not intended to replace advice given to you by your health care provider. Make sure you discuss any questions you have with your health care provider. Document Released: 12/03/2008 Document Revised: 07/15/2015 Document Reviewed: 03/13/2014 Elsevier Interactive Patient Education  2017 [employer].

## 2018-10-23 ENCOUNTER — Other Ambulatory Visit: Payer: Medicare Other

## 2018-10-23 ENCOUNTER — Other Ambulatory Visit: Payer: Self-pay

## 2018-10-23 DIAGNOSIS — C61 Malignant neoplasm of prostate: Secondary | ICD-10-CM

## 2018-10-24 LAB — PSA: Prostate Specific Ag, Serum: 4.8 ng/mL — ABNORMAL HIGH (ref 0.0–4.0)

## 2018-10-25 ENCOUNTER — Telehealth: Payer: Self-pay | Admitting: Family Medicine

## 2018-10-25 NOTE — Telephone Encounter (Signed)
-----   Message from Hollice Espy, MD sent at 10/25/2018  1:36 PM EDT ----- Your PSA continues to rise.  It is now up to 4.8.  I like to see you in follow-up in the near future to discuss further.  Please schedule a visit with me.  Hollice Espy, MD

## 2018-10-25 NOTE — Telephone Encounter (Signed)
Patient notified and appointment scheduled. 

## 2018-10-31 ENCOUNTER — Other Ambulatory Visit: Payer: Self-pay

## 2018-10-31 ENCOUNTER — Other Ambulatory Visit: Payer: Self-pay | Admitting: Family Medicine

## 2018-10-31 DIAGNOSIS — I10 Essential (primary) hypertension: Secondary | ICD-10-CM

## 2018-10-31 NOTE — Patient Outreach (Signed)
[facility]) Care Management  10/31/2018  Tyler Morgan 06-20-1945 [id number]   Medication Adherence call to [name] [facility] Identifiers Verify spoke with patient he is past due on Metformin 500 mg patient explain he is taking 3 tablet daily he was prescribe 4 tablets daily if he need it patient explain doctor wrote prescription this way to see if it will work for him patient had extra's,pt will be needing it in a couple of week and will order it then. [name].   [place] Management Direct Dial [phone]  Fax [phone] [email or web address]

## 2018-11-20 ENCOUNTER — Other Ambulatory Visit: Payer: Self-pay

## 2018-11-20 ENCOUNTER — Encounter: Payer: Self-pay | Admitting: Urology

## 2018-11-20 ENCOUNTER — Ambulatory Visit: Payer: Medicare Other | Admitting: Urology

## 2018-11-20 VITALS — BP 116/72 | HR 71 | Ht 73.0 in | Wt 190.0 lb

## 2018-11-20 DIAGNOSIS — R972 Elevated prostate specific antigen [PSA]: Secondary | ICD-10-CM

## 2018-11-20 DIAGNOSIS — C61 Malignant neoplasm of prostate: Secondary | ICD-10-CM | POA: Diagnosis not present

## 2018-11-20 DIAGNOSIS — Z87438 Personal history of other diseases of male genital organs: Secondary | ICD-10-CM

## 2018-11-20 NOTE — Progress Notes (Signed)
11/20/2018 4:02 PM   [name] 1945-03-22 [id number]  Referring provider: Guadalupe Maple, MD [address] [place],  [place] [address]  Chief Complaint  Patient presents with  . 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  Specifically, his PSA is rising and he presents to discuss this.  A total of 114 g were resected. 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  He does have a personal history of elevated PSA. He is undergone biopsy in the past which was negative (~10+ years ago with Dr. Bernardo Heater, records not available). PreopPSA 8.5 on 10/11/16.  More recently, his PSA has been rising.  PSA 2.0 (off finasteride) on 03/2018 up from post op PSA of 0.9 (on finsteride).   This was repeated on 10/23/2018 now up to 4.8.  Notably, he was treated for urinary tract infection back in March.  He grew Proteus Mirabella's and was treated with Bactrim.  In terms of urinary control, he is doing very well.  He has an excellent stream still.  He has no urinary complaints today.  No dysuria or gross hematuria.  Is very pleased with the symptoms.  PMH: Past Medical History:  Diagnosis Date  . Diabetes mellitus without complication ([place])   . ED (erectile dysfunction)   . Hypertension   . Motion sickness    boats  . Neuropathy   . Prostate enlargement   . Urinary retention     Surgical History: Past Surgical History:  Procedure Laterality Date  . BASAL CELL CARCINOMA EXCISION     nose   . CATARACT EXTRACTION Bilateral 1986, 2014  . COLONOSCOPY WITH PROPOFOL N/A 03/27/2018   Procedure: COLONOSCOPY WITH BIOPSIES;  Surgeon: Lin Landsman, MD;  Location: [facility];  Service: Endoscopy;   Laterality: N/A;  Diabetic - oral meds  . EYE SURGERY Bilateral [id number]  . FRACTURE SURGERY Right    as a child/ right arm  . HERNIA REPAIR Right 01-13-14   inguinal hernia  . 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 POLYPECTOMY N/A 03/27/2018   Procedure: POLYPECTOMY;  Surgeon: Lin Landsman, MD;  Location: [facility];  Service: Endoscopy;  Laterality: N/A;  . RETINAL DETACHMENT SURGERY  Jan 2015  . TONSILLECTOMY  1955    Home Medications:  Allergies as of 11/20/2018      Reactions   Acetaminophen Swelling, Other (See Comments)   Swelling of liver    Amlodipine Swelling      Medication List       Accurate as of November 20, 2018  4:02 PM. If you have any questions, ask your nurse or doctor.        atenolol 50 MG tablet Commonly known as: TENORMIN Take 1 tablet (50 mg total) by mouth daily.   atorvastatin 20 MG tablet Commonly known as: LIPITOR Take 1 tablet (20 mg total) by mouth daily.   benazepril 40 MG tablet Commonly known as: LOTENSIN TAKE ONE TABLET EVERY DAY   hydrochlorothiazide 25 MG tablet Commonly known as: HYDRODIURIL TAKE ONE TABLET BY MOUTH EVERY DAY  metFORMIN 500 MG tablet Commonly known as: GLUCOPHAGE Take 1 tablet (500 mg total) by mouth 3 (three) times daily.   TURMERIC PO Take 1 capsule by mouth daily.   Vitamin D-3 25 MCG (1000 UT) Caps Take 2,000 Units by mouth daily.       Allergies:  Allergies  Allergen Reactions  . Acetaminophen Swelling and Other (See Comments)    Swelling of liver   . Amlodipine Swelling    Family History: Family History  Problem Relation Age of Onset  . Colon cancer Father   . Angina Father   . Macular degeneration Father   . Alzheimer's disease Mother   . Prostate cancer Neg Hx   . Bladder  Cancer Neg Hx   . Kidney cancer Neg Hx     Social History:  reports that he quit smoking about 34 years ago. His smoking use included cigarettes. He has a 13.00 pack-year smoking history. He has never used smokeless tobacco. He reports current alcohol use of about 2.0 standard drinks of alcohol per week. He reports that he does not use drugs.  ROS: UROLOGY Frequent Urination?: No Hard to postpone urination?: No Burning/pain with urination?: No Get up at night to urinate?: No Leakage of urine?: No Urine stream starts and stops?: No Trouble starting stream?: No Do you have to strain to urinate?: No Blood in urine?: No Urinary tract infection?: No Sexually transmitted disease?: No Injury to kidneys or bladder?: No Painful intercourse?: No Weak stream?: No Erection problems?: No Penile pain?: No  Gastrointestinal Nausea?: No Vomiting?: No Indigestion/heartburn?: No Diarrhea?: No Constipation?: No  Constitutional Fever: No Night sweats?: No Weight loss?: No Fatigue?: No  Skin Skin rash/lesions?: No Itching?: No  Eyes Blurred vision?: No Double vision?: No  Ears/Nose/Throat Sore throat?: No Sinus problems?: No  Hematologic/Lymphatic Swollen glands?: No Easy bruising?: No  Cardiovascular Leg swelling?: No Chest pain?: No  Respiratory Cough?: No Shortness of breath?: No  Endocrine Excessive thirst?: No  Musculoskeletal Back pain?: No Joint pain?: No  Neurological Headaches?: No Dizziness?: No  Psychologic Depression?: No Anxiety?: No  Physical Exam: BP 116/72   Pulse 71   Ht 6\' 1"  (1.854 m)   Wt 190 lb (86.2 kg)   BMI 25.07 kg/m   Constitutional:  Alert and oriented, No acute distress. HEENT: [place] AT, moist mucus membranes.  Trachea midline, no masses. Cardiovascular: No clubbing, cyanosis, or edema. Respiratory: Normal respiratory effort, no increased work of breathing. GI: Abdomen is soft, nontender, nondistended, no abdominal masses  Rectal: Normal sphincter tone.  50+ cc prostate, firm, nontender, no nodules appreciated. Skin: No rashes, bruises or suspicious lesions. Neurologic: Grossly intact, no focal deficits, moving all 4 extremities. Psychiatric: Normal mood and affect.  Laboratory Data: Lab Results  Component Value Date   WBC 4.6 12/13/2017   HGB 14.1 12/13/2017   HCT 41.9 12/13/2017   MCV 86 12/13/2017   PLT 221 12/13/2017    Lab Results  Component Value Date   CREATININE 0.75 (L) 07/30/2018    PSA trend as above   Urinalysis Pending .  Assessment & Plan:    1. Prostate cancer ([place]) Incidental very low volume Gleason 3+3 prostate cancer on holep specimen PSA now rising, initially felt to be cessation of finasteride effect however it is doubled again since that time Rectal exam today is reassuring We will plan to repeat his PSA/UA today We discussed the differential diagnosis including prostatic regrowth versus prostatic inflammation versus prostate cancer If his PSA  continues to remain elevated or continues to rise, would recommend prostate biopsy versus MRI He is interested in MRI and may consider fusion biopsy if a lesion is identified down the road We will call him tomorrow with the results and let him know our recommendations - Urinalysis, Complete  2. Rising PSA level As above - Urinalysis, Complete - PSA  3. History of BPH Symptoms extremely well controlled status post holep on no meds  Will call with recommendations based on labs tomorrow  Hollice Espy, MD  [facility] [address], [place], [place] [address] [phone]

## 2018-11-21 ENCOUNTER — Telehealth: Payer: Self-pay | Admitting: *Deleted

## 2018-11-21 DIAGNOSIS — R972 Elevated prostate specific antigen [PSA]: Secondary | ICD-10-CM

## 2018-11-21 DIAGNOSIS — C61 Malignant neoplasm of prostate: Secondary | ICD-10-CM

## 2018-11-21 LAB — URINALYSIS, COMPLETE
Bilirubin, UA: NEGATIVE
Leukocytes,UA: NEGATIVE
Nitrite, UA: NEGATIVE
Protein,UA: NEGATIVE
Specific Gravity, UA: 1.025 (ref 1.005–1.030)
Urobilinogen, Ur: 0.2 mg/dL (ref 0.2–1.0)
pH, UA: 5.5 (ref 5.0–7.5)

## 2018-11-21 LAB — MICROSCOPIC EXAMINATION: Bacteria, UA: NONE SEEN

## 2018-11-21 LAB — PSA: Prostate Specific Ag, Serum: 4.9 ng/mL — ABNORMAL HIGH (ref 0.0–4.0)

## 2018-11-21 NOTE — Telephone Encounter (Addendum)
Spoke with patient-scheduled follow up appointment and orderd MRI-patient verbalized understanding.   ----- Message from Hollice Espy, MD sent at 11/21/2018  7:58 AM EDT ----- PSA is still up, now 4.9.  As per discussion yesterday, I think that MRI could be very helpful.  Please order a prostate MRI with and without contrast and schedule follow-up to discuss the results.  Hollice Espy, MD

## 2018-12-02 ENCOUNTER — Other Ambulatory Visit: Payer: Self-pay | Admitting: Family Medicine

## 2018-12-02 DIAGNOSIS — I10 Essential (primary) hypertension: Secondary | ICD-10-CM

## 2018-12-05 ENCOUNTER — Other Ambulatory Visit: Payer: Self-pay

## 2018-12-05 ENCOUNTER — Ambulatory Visit
Admission: RE | Admit: 2018-12-05 | Discharge: 2018-12-05 | Disposition: A | Discharge status: Home / Self Care | Payer: Medicare Other | Source: Ambulatory Visit | Attending: Urology | Admitting: Urology

## 2018-12-05 DIAGNOSIS — C61 Malignant neoplasm of prostate: Secondary | ICD-10-CM | POA: Diagnosis present

## 2018-12-05 DIAGNOSIS — R972 Elevated prostate specific antigen [PSA]: Secondary | ICD-10-CM | POA: Insufficient documentation

## 2018-12-05 LAB — POCT I-STAT CREATININE: Creatinine, Ser: 0.7 mg/dL (ref 0.61–1.24)

## 2018-12-05 MED ORDER — GADOBUTROL 1 MMOL/ML IV SOLN
7.0000 mL | Freq: Once | INTRAVENOUS | Status: AC | PRN
  Administered 2018-12-05: 17:00:00 7 mL via INTRAVENOUS

## 2018-12-09 ENCOUNTER — Telehealth: Payer: Self-pay | Admitting: Urology

## 2018-12-09 NOTE — Telephone Encounter (Signed)
12/09/18  Called to discuss prostate MRI results.  He does have a lesion, PI-RADS 5 which is highly concerning.  Given size of lesion, I do feel that this would be amenable to an office cognitive fusion biopsy.  Alternatives including a formal fusion biopsy at [facility] urology was also discussed.  We discussed prostate biopsy in detail including the procedure itself, the risks of blood in the urine, stool, and ejaculate, serious infection, and discomfort. He is willing to proceed with this as discussed.  He is willing to undergo a cognitive fusion biopsy in the office.  We will change his office visit type on Wednesday to press a biopsy.  We reviewed preprocedure instructions including avoidance of NSAID/aspirin as well as an enema.  We will also send him a reminder via [place].  Hollice Espy, MD

## 2018-12-11 ENCOUNTER — Other Ambulatory Visit: Payer: Self-pay

## 2018-12-11 ENCOUNTER — Other Ambulatory Visit: Payer: Self-pay | Admitting: Urology

## 2018-12-11 ENCOUNTER — Ambulatory Visit: Payer: Medicare Other | Admitting: Urology

## 2018-12-11 ENCOUNTER — Encounter: Payer: Self-pay | Admitting: Urology

## 2018-12-11 VITALS — BP 125/75 | HR 69 | Ht 73.0 in | Wt 191.0 lb

## 2018-12-11 DIAGNOSIS — C61 Malignant neoplasm of prostate: Secondary | ICD-10-CM | POA: Diagnosis not present

## 2018-12-11 MED ORDER — LEVOFLOXACIN 500 MG PO TABS
500.0000 mg | ORAL_TABLET | Freq: Once | ORAL | Status: AC
  Administered 2018-12-11: 500 mg via ORAL

## 2018-12-11 MED ORDER — GENTAMICIN SULFATE 40 MG/ML IJ SOLN
80.0000 mg | Freq: Once | INTRAMUSCULAR | Status: AC
  Administered 2018-12-11: 80 mg via INTRAMUSCULAR

## 2018-12-11 NOTE — Progress Notes (Signed)
   12/11/18  CC:  Chief Complaint  Patient presents with  . Prostate Biopsy    HPI: 73 year old male with incidental low risk prostate cancer status post holmium laser enucleation of the prostate with subsequent rising PSA.  He recently underwent prostate MRI on 11/2013 2020 which showed a large 2.2 x 1.7 x 2.2 cm nodule on the left lateral and posterior mid gland and apex, concerning for PI-RADS 5 lesion.  Blood pressure 125/75, pulse 69, height 6\' 1"  (1.854 m), weight 191 lb (86.6 kg). NED. A&Ox3.   No respiratory distress   Abd soft, NT, ND Normal sphincter tone  Prostate Biopsy Procedure   Informed consent was obtained after discussing risks/benefits of the procedure.  A time out was performed to ensure correct patient identity.  Pre-Procedure: - Last PSA Level: No results found for: PSA - Gentamicin given prophylactically - Levaquin 500 mg administered PO -Transrectal Ultrasound performed revealing a 93 gm prostate -Prostatic asymmetry, left prostate greater than right with some irregularities in the peripheral zone which correspond with MRI findings.  There is also a small left intravesical protrusion?  Small median lobe.  Procedure: - Prostate block performed using 10 cc 1% lidocaine and biopsies taken from sextant areas, a total of 12 under ultrasound guidance.  It did include some extra biopsies of the left lateral gland in the 12 core biopsies to ensure adequate sampling of the PI-RADS 5 lesion.  Post-Procedure: - Patient tolerated the procedure well - He was counseled to seek immediate medical attention if experiences any severe pain, significant bleeding, or fevers - Return in two week to discuss biopsy results   Return in about 2 weeks (around 12/25/2018) for MD follow up.  Hollice Espy, MD

## 2018-12-19 LAB — ANATOMIC PATHOLOGY REPORT: PDF Image: 0

## 2018-12-25 ENCOUNTER — Ambulatory Visit (INDEPENDENT_AMBULATORY_CARE_PROVIDER_SITE_OTHER): Payer: Medicare Other | Admitting: Urology

## 2018-12-25 ENCOUNTER — Other Ambulatory Visit: Payer: Self-pay

## 2018-12-25 VITALS — BP 132/76 | HR 63 | Ht 73.0 in | Wt 189.0 lb

## 2018-12-25 DIAGNOSIS — Z87438 Personal history of other diseases of male genital organs: Secondary | ICD-10-CM | POA: Diagnosis not present

## 2018-12-25 DIAGNOSIS — R972 Elevated prostate specific antigen [PSA]: Secondary | ICD-10-CM

## 2018-12-25 DIAGNOSIS — C61 Malignant neoplasm of prostate: Secondary | ICD-10-CM | POA: Diagnosis not present

## 2018-12-25 NOTE — Progress Notes (Signed)
12/25/2018 11:41 AM   [name] 1945/09/03 [id number]  Referring provider: Guadalupe Maple, MD [address] [place],  [place] [address]  Chief Complaint  Patient presents with  . Results    HPI: 73 year old male who returns today to discuss recent prostate biopsy results.  He has a personal history of massive BPH S/plaser nucleation of the prostate 01/2017 with incidental low risk prostate cancer returns today for surveillance.   A total of 114 g were resected. 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  He does have a personal history of elevated PSA. He is undergone biopsy in the past which was negative (~10+ years ago with Dr. Bernardo Heater, records not available). PreopPSA 8.5 on 10/11/16.  His initial postoperative PSA was 0.9 08/2017 on finasteride.  He has since stopped this medication and his PSA continues to rise, most recently up to 4.8 as of 10/23/2018.  As further evaluation for this, he underwent prostate MRI on 12/05/2018.  This revealed a 2.2 cm peripheral zone nodule at the left lateral and posterior mid apex which was read as of PI-RADS 5 lesion.  No evidence of extracapsular extension or neurovascular involvement.  No lymphadenopathy.  Estimated residual prostatic volume 74 cc.  He underwent prostate biopsy which was a "cognitive fusion" biopsy which did in fact demonstrate some prostatic asymmetry, left greater than right.  Several additional samples of the left area of interest were biopsied but combined with the usual 12 core biopsy.  No complications.  Prostate biopsy results are all benign.  There is evidence of both chronic and acute inflammation.  No malignancy was identified.  Overall, he is doing excellent.  He has no urinary complaints today as per previous.  He takes no BPH meds at this time.  PMH: Past Medical History:  Diagnosis  Date  . Diabetes mellitus without complication ([place])   . ED (erectile dysfunction)   . Hypertension   . Motion sickness    boats  . Neuropathy   . Prostate enlargement   . Urinary retention     Surgical History: Past Surgical History:  Procedure Laterality Date  . BASAL CELL CARCINOMA EXCISION     nose   . CATARACT EXTRACTION Bilateral 1986, 2014  . COLONOSCOPY WITH PROPOFOL N/A 03/27/2018   Procedure: COLONOSCOPY WITH BIOPSIES;  Surgeon: Lin Landsman, MD;  Location: [facility];  Service: Endoscopy;  Laterality: N/A;  Diabetic - oral meds  . EYE SURGERY Bilateral [id number]  . FRACTURE SURGERY Right    as a child/ right arm  . HERNIA REPAIR Right 01-13-14   inguinal hernia  . 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 POLYPECTOMY N/A 03/27/2018   Procedure: POLYPECTOMY;  Surgeon: Lin Landsman, MD;  Location: [facility];  Service: Endoscopy;  Laterality: N/A;  . RETINAL DETACHMENT SURGERY  Jan 2015  . TONSILLECTOMY  1955    Home Medications:  Allergies as of 12/25/2018      Reactions   Acetaminophen Swelling, Other (See Comments)   Swelling of liver    Amlodipine Swelling      Medication List       Accurate as of December 25, 2018 11:59 PM. If you have any questions, ask your nurse or  doctor.        atenolol 50 MG tablet Commonly known as: TENORMIN TAKE ONE TABLET BY MOUTH EVERY DAY   atorvastatin 20 MG tablet Commonly known as: LIPITOR TAKE ONE TABLET EVERY DAY   benazepril 40 MG tablet Commonly known as: LOTENSIN TAKE ONE TABLET EVERY DAY   Calcium 600 1500 (600 Ca) MG Tabs tablet Generic drug: calcium carbonate Take by mouth 2 (two) times daily with a meal.   hydrochlorothiazide 25 MG tablet Commonly known as: HYDRODIURIL TAKE ONE  TABLET BY MOUTH EVERY DAY   metFORMIN 500 MG tablet Commonly known as: GLUCOPHAGE Take 1 tablet (500 mg total) by mouth 3 (three) times daily.   TURMERIC PO Take 1 capsule by mouth daily.   Vitamin D-3 25 MCG (1000 UT) Caps Take 2,000 Units by mouth daily.       Allergies:  Allergies  Allergen Reactions  . Acetaminophen Swelling and Other (See Comments)    Swelling of liver   . Amlodipine Swelling    Family History: Family History  Problem Relation Age of Onset  . Colon cancer Father   . Angina Father   . Macular degeneration Father   . Alzheimer's disease Mother   . Prostate cancer Neg Hx   . Bladder Cancer Neg Hx   . Kidney cancer Neg Hx     Social History:  reports that he quit smoking about 34 years ago. His smoking use included cigarettes. He has a 13.00 pack-year smoking history. He has never used smokeless tobacco. He reports current alcohol use of about 2.0 standard drinks of alcohol per week. He reports that he does not use drugs.  ROS: UROLOGY Frequent Urination?: No Hard to postpone urination?: No Burning/pain with urination?: No Get up at night to urinate?: No Leakage of urine?: No Urine stream starts and stops?: No Trouble starting stream?: No Do you have to strain to urinate?: No Blood in urine?: No Urinary tract infection?: No Sexually transmitted disease?: No Injury to kidneys or bladder?: No Painful intercourse?: No Weak stream?: No Erection problems?: No Penile pain?: No  Gastrointestinal Nausea?: No Vomiting?: No Indigestion/heartburn?: No Diarrhea?: No Constipation?: No  Constitutional Fever: No Night sweats?: No Weight loss?: No Fatigue?: No  Skin Skin rash/lesions?: No Itching?: No  Eyes Blurred vision?: No Double vision?: No  Ears/Nose/Throat Sore throat?: No Sinus problems?: No  Hematologic/Lymphatic Swollen glands?: No Easy bruising?: No  Cardiovascular Leg swelling?: No Chest pain?: No  Respiratory  Cough?: No Shortness of breath?: No  Endocrine Excessive thirst?: No  Musculoskeletal Back pain?: No Joint pain?: No  Neurological Headaches?: No Dizziness?: No  Psychologic Depression?: No Anxiety?: No  Physical Exam: BP 132/76   Pulse 63   Ht 6\' 1"  (1.854 m)   Wt 189 lb (85.7 kg)   BMI 24.94 kg/m   Constitutional:  Alert and oriented, No acute distress. HEENT: [place] AT, moist mucus membranes.  Trachea midline, no masses. Cardiovascular: No clubbing, cyanosis, or edema. Respiratory: Normal respiratory effort, no increased work of breathing. Skin: No rashes, bruises or suspicious lesions. Neurologic: Grossly intact, no focal deficits, moving all 4 extremities. Psychiatric: Normal mood and affect.   Assessment & Plan:    1. Prostate cancer [facility]) Personal history of incidental low volume low risk prostate cancer at the time of enucleation 2018  Repeat biopsy in the setting of rising PSA after cessation of finasteride benign.  The cyst show evidence of acute and chronic inflammation which may explain part of his  rising PSA as well as prostatic regrowth.  Although he is a PI-RADS 5 lesion on MRI, the lesion in question is quite large and on cognitive fusion, was felt to be adequately sampled on this most recent biopsy.  MRI findings may be related to inflammatory and postsurgical changes especially given the degree of previous resection with over 100 g removed likely causing irregularity within the gland commonly imaged.  As per time, would recommend continued observation.  We will plan to recheck his PSA in 3 months and reassess at that time, will call him with these results and determine whether or not he needs to be seen virtually, in clinic, or at a later date based on the results.  He is agreeable this plan.  Alternatives including a more formalized fusion biopsy were discussed, will hold off for the time being. - PSA; Future  2. Rising PSA level As above  3.  History of BPH Asymptomatic on no BPH meds   Return in about 3 months (around 03/27/2019) for PSA lab only.  Hollice Espy, MD  [facility] [address], [place], [place] [address] [phone]

## 2018-12-30 ENCOUNTER — Encounter: Payer: Self-pay | Admitting: Family Medicine

## 2019-01-26 ENCOUNTER — Encounter: Payer: Self-pay | Admitting: Nurse Practitioner

## 2019-01-28 ENCOUNTER — Other Ambulatory Visit: Payer: Self-pay

## 2019-01-29 ENCOUNTER — Encounter: Payer: Self-pay | Admitting: Nurse Practitioner

## 2019-01-29 ENCOUNTER — Ambulatory Visit: Payer: Self-pay | Admitting: Family Medicine

## 2019-01-29 ENCOUNTER — Other Ambulatory Visit: Payer: Self-pay

## 2019-01-29 ENCOUNTER — Ambulatory Visit (INDEPENDENT_AMBULATORY_CARE_PROVIDER_SITE_OTHER): Payer: Medicare Other | Admitting: Nurse Practitioner

## 2019-01-29 VITALS — BP 121/74 | HR 62 | Temp 98.1°F

## 2019-01-29 DIAGNOSIS — IMO0002 Reserved for concepts with insufficient information to code with codable children: Secondary | ICD-10-CM

## 2019-01-29 DIAGNOSIS — E1169 Type 2 diabetes mellitus with other specified complication: Secondary | ICD-10-CM | POA: Diagnosis not present

## 2019-01-29 DIAGNOSIS — E1349 Other specified diabetes mellitus with other diabetic neurological complication: Secondary | ICD-10-CM

## 2019-01-29 DIAGNOSIS — E1159 Type 2 diabetes mellitus with other circulatory complications: Secondary | ICD-10-CM | POA: Diagnosis not present

## 2019-01-29 DIAGNOSIS — C61 Malignant neoplasm of prostate: Secondary | ICD-10-CM | POA: Diagnosis not present

## 2019-01-29 DIAGNOSIS — E538 Deficiency of other specified B group vitamins: Secondary | ICD-10-CM | POA: Diagnosis not present

## 2019-01-29 DIAGNOSIS — I10 Essential (primary) hypertension: Secondary | ICD-10-CM

## 2019-01-29 DIAGNOSIS — E785 Hyperlipidemia, unspecified: Secondary | ICD-10-CM | POA: Diagnosis not present

## 2019-01-29 DIAGNOSIS — E1365 Other specified diabetes mellitus with hyperglycemia: Secondary | ICD-10-CM

## 2019-01-29 DIAGNOSIS — I152 Hypertension secondary to endocrine disorders: Secondary | ICD-10-CM

## 2019-01-29 DIAGNOSIS — D510 Vitamin B12 deficiency anemia due to intrinsic factor deficiency: Secondary | ICD-10-CM | POA: Diagnosis not present

## 2019-01-29 LAB — MICROALBUMIN, URINE WAIVED
Creatinine, Urine Waived: 50 mg/dL (ref 10–300)
Microalb, Ur Waived: 10 mg/L (ref 0–19)

## 2019-01-29 LAB — BAYER DCA HB A1C WAIVED: HB A1C (BAYER DCA - WAIVED): 7.8 % — ABNORMAL HIGH (ref <7.0)

## 2019-01-29 NOTE — Progress Notes (Signed)
BP 121/74   Pulse 62   Temp 98.1 F (36.7 C) (Oral)   SpO2 98%    Subjective:    Patient ID: Tyler Morgan, male    DOB: [birth year], 73 y.o.   MRN: [id number]  HPI: Tyler Morgan is a 73 y.o. male  Chief Complaint  Patient presents with  . Diabetes    pt states he has not had a recent eye exam   . Hyperlipidemia  . Hypertension   DIABETES Is currently on Metformin 500 MG TID, has never taken 1000 MG BID.   Has been on Metformin for about 3 years and recently has noted occasional brain fogginess and neuropathy numbness to feet, has not had B12 level checked in several years.  Was on Jardiance in past, on for less than a year.  LAst A1C 8.5% and today 7.8, trend downwards with diet changes.  Hypoglycemic episodes:no Polydipsia/polyuria: no Visual disturbance: no Chest pain: no Paresthesias: no Glucose Monitoring: no  Accucheck frequency: Not Checking  Fasting glucose:  Post prandial:  Evening:  Before meals: Taking Insulin?: no  Long acting insulin:  Short acting insulin: Blood Pressure Monitoring: rarely Retinal Examination: Not up to Date Foot Exam: Up to Date Pneumovax: Up to Date Influenza: Up to Date Aspirin: no   HYPERTENSION / HYPERLIPIDEMIA Continues on Benazepril, Atemolol, and HCTZ + Lipitor. Satisfied with current treatment? yes Duration of hypertension: chronic BP monitoring frequency: rarely BP range: <120/80 BP medication side effects: no Duration of hyperlipidemia: chronic Cholesterol medication side effects: no Cholesterol supplements: none Medication compliance: good compliance Aspirin: no Recent stressors: no Recurrent headaches: no Visual changes: no Palpitations: no Dyspnea: no Chest pain: no Lower extremity edema: no Dizzy/lightheaded: no   PROSTATE CANCER: Followed by Dr. Erlene Quan and last saw 12/25/2018, was told at recent visit he was "cancer free".  Continues to have monitoring of PSA.  Relevant past medical,  surgical, family and social history reviewed and updated as indicated. Interim medical history since our last visit reviewed. Allergies and medications reviewed and updated.  Review of Systems  Constitutional: Negative for activity change, diaphoresis, fatigue and fever.  Respiratory: Negative for cough, chest tightness, shortness of breath and wheezing.   Cardiovascular: Negative for chest pain, palpitations and leg swelling.  Gastrointestinal: Negative for abdominal distention, abdominal pain, constipation, diarrhea, nausea and vomiting.  Endocrine: Negative for cold intolerance, heat intolerance, polydipsia, polyphagia and polyuria.  Neurological: Negative for dizziness, syncope, weakness, light-headedness, numbness and headaches.  Psychiatric/Behavioral: Negative.     Per HPI unless specifically indicated above     Objective:    BP 121/74   Pulse 62   Temp 98.1 F (36.7 C) (Oral)   SpO2 98%   Wt Readings from Last 3 Encounters:  12/25/18 189 lb (85.7 kg)  12/11/18 191 lb (86.6 kg)  11/20/18 190 lb (86.2 kg)    Physical Exam Vitals signs and nursing note reviewed.  Constitutional:      General: He is awake. He is not in acute distress.    Appearance: He is well-developed. He is not ill-appearing.  HENT:     Head: Normocephalic and atraumatic.     Right Ear: Hearing normal. No drainage.     Left Ear: Hearing normal. No drainage.  Eyes:     General: Lids are normal.        Right eye: No discharge.        Left eye: No discharge.     Conjunctiva/sclera: Conjunctivae normal.  Pupils: Pupils are equal, round, and reactive to light.  Neck:     Musculoskeletal: Normal range of motion and neck supple.     Thyroid: No thyromegaly.     Vascular: No carotid bruit.  Cardiovascular:     Rate and Rhythm: Normal rate and regular rhythm.     Heart sounds: Normal heart sounds, S1 normal and S2 normal. No murmur. No gallop.   Pulmonary:     Effort: Pulmonary effort is normal. No  accessory muscle usage or respiratory distress.     Breath sounds: Normal breath sounds.  Abdominal:     General: Bowel sounds are normal.     Palpations: Abdomen is soft.  Musculoskeletal: Normal range of motion.     Right lower leg: No edema.     Left lower leg: No edema.  Skin:    General: Skin is warm and dry.  Neurological:     Mental Status: He is alert and oriented to person, place, and time.     Deep Tendon Reflexes: Reflexes are normal and symmetric.  Psychiatric:        Attention and Perception: Attention normal.        Mood and Affect: Mood normal.        Speech: Speech normal.        Behavior: Behavior normal. Behavior is cooperative.        Thought Content: Thought content normal.        Judgment: Judgment normal.     Results for orders placed or performed in visit on 01/29/19  Bayer DCA Hb A1c Waived  Result Value Ref Range   HB A1C (BAYER DCA - WAIVED) 7.8 (H) <7.0 %  Microalbumin, Urine Waived here  Result Value Ref Range   Microalb, Ur Waived 10 0 - 19 mg/L   Creatinine, Urine Waived 50 10 - 300 mg/dL   Microalb/Creat Ratio 30-300 (H) <30 mg/g      Assessment & Plan:   Problem List Items Addressed This Visit      Cardiovascular and Mediastinum   Hypertension associated with diabetes ([place])    Chronic, stable with BP at goal in office and home.  Urine ALB 10 and A:C 30-300.  Continue current medication regimen with Benazepril offering kidney protection.  Recommend checking BP a few mornings a week at home.  CMP today.  Return in 3 months.      Relevant Orders   Bayer DCA Hb A1c Waived (Completed)   Comprehensive metabolic panel     Endocrine   DM (diabetes mellitus), secondary, uncontrolled, w/neurologic complic ([place]) - Primary    Chronic, ongoing with A1C now trending downwards at 7.8%.  Continue Metformin TID and diet focus.  Recommend he check BS at least three mornings a week and document.  If ongoing elevation above goal next check would consider  increasing Metformin to max or adding SGLT.  Return in 3 months.      Relevant Orders   Bayer DCA Hb A1c Waived (Completed)   Microalbumin, Urine Waived here (Completed)   Hyperlipidemia associated with type 2 diabetes mellitus (HCC)    Chronic, ongoing.  Levels improved with statin.  Had at length discussion with him about labs and statin use.  Continue current medication regimen and adjust as needed, recent LDL 70.  Check lipid panel and CMP today.        Relevant Orders   Bayer DCA Hb A1c Waived (Completed)   Comprehensive metabolic panel   Lipid Panel  w/o Chol/HDL Ratio out     Genitourinary   Prostate cancer [facility])    Recent visit with Dr. Erlene Quan and was told is cancer free.  Continue collaboration with urology.        Other   B12 deficiency    Check B12 level today, history of low levels.      Relevant Orders   Vitamin B12       Follow up plan: Return in about 3 months (around 04/29/2019) for T2DM, HTN/HLD.

## 2019-01-29 NOTE — Assessment & Plan Note (Signed)
Chronic, stable with BP at goal in office and home.  Urine ALB 10 and A:C 30-300.  Continue current medication regimen with Benazepril offering kidney protection.  Recommend checking BP a few mornings a week at home.  CMP today.  Return in 3 months.

## 2019-01-29 NOTE — Assessment & Plan Note (Signed)
Chronic, ongoing with A1C now trending downwards at 7.8%.  Continue Metformin TID and diet focus.  Recommend he check BS at least three mornings a week and document.  If ongoing elevation above goal next check would consider increasing Metformin to max or adding SGLT.  Return in 3 months.

## 2019-01-29 NOTE — Assessment & Plan Note (Signed)
Recent visit with Dr. Brandon and was told is cancer free.  Continue collaboration with urology. 

## 2019-01-29 NOTE — Assessment & Plan Note (Signed)
Chronic, ongoing.  Levels improved with statin.  Had at length discussion with him about labs and statin use.  Continue current medication regimen and adjust as needed, recent LDL 70.  Check lipid panel and CMP today.

## 2019-01-29 NOTE — Patient Instructions (Signed)
Carbohydrate Counting for Diabetes Mellitus, Adult  Carbohydrate counting is a method of keeping track of how many carbohydrates you eat. Eating carbohydrates naturally increases the amount of sugar (glucose) in the blood. Counting how many carbohydrates you eat helps keep your blood glucose within normal limits, which helps you manage your diabetes (diabetes mellitus). It is important to know how many carbohydrates you can safely have in each meal. This is different for every person. A diet and nutrition specialist (registered dietitian) can help you make a meal plan and calculate how many carbohydrates you should have at each meal and snack. Carbohydrates are found in the following foods:  Grains, such as breads and cereals.  Dried beans and soy products.  Starchy vegetables, such as potatoes, peas, and corn.  Fruit and fruit juices.  Milk and yogurt.  Sweets and snack foods, such as cake, cookies, candy, chips, and soft drinks. How do I count carbohydrates? There are two ways to count carbohydrates in food. You can use either of the methods or a combination of both. Reading "Nutrition Facts" on packaged food The "Nutrition Facts" list is included on the labels of almost all packaged foods and beverages in the [place] It includes:  The serving size.  Information about nutrients in each serving, including the grams (g) of carbohydrate per serving. To use the "Nutrition Facts":  Decide how many servings you will have.  Multiply the number of servings by the number of carbohydrates per serving.  The resulting number is the total amount of carbohydrates that you will be having. Learning standard serving sizes of other foods When you eat carbohydrate foods that are not packaged or do not include "Nutrition Facts" on the label, you need to measure the servings in order to count the amount of carbohydrates:  Measure the foods that you will eat with a food scale or measuring cup, if needed.   Decide how many standard-size servings you will eat.  Multiply the number of servings by 15. Most carbohydrate-rich foods have about 15 g of carbohydrates per serving. ? For example, if you eat 8 oz (170 g) of strawberries, you will have eaten 2 servings and 30 g of carbohydrates (2 servings x 15 g = 30 g).  For foods that have more than one food mixed, such as soups and casseroles, you must count the carbohydrates in each food that is included. The following list contains standard serving sizes of common carbohydrate-rich foods. Each of these servings has about 15 g of carbohydrates:   hamburger bun or  English muffin.   oz (15 mL) syrup.   oz (14 g) jelly.  1 slice of bread.  1 six-inch tortilla.  3 oz (85 g) cooked rice or pasta.  4 oz (113 g) cooked dried beans.  4 oz (113 g) starchy vegetable, such as peas, corn, or potatoes.  4 oz (113 g) hot cereal.  4 oz (113 g) mashed potatoes or  of a large baked potato.  4 oz (113 g) canned or frozen fruit.  4 oz (120 mL) fruit juice.  4-6 crackers.  6 chicken nuggets.  6 oz (170 g) unsweetened dry cereal.  6 oz (170 g) plain fat-free yogurt or yogurt sweetened with artificial sweeteners.  8 oz (240 mL) milk.  8 oz (170 g) fresh fruit or one small piece of fruit.  24 oz (680 g) popped popcorn. Example of carbohydrate counting Sample meal  3 oz (85 g) chicken breast.  6 oz (170 g)   brown rice.  4 oz (113 g) corn.  8 oz (240 mL) milk.  8 oz (170 g) strawberries with sugar-free whipped topping. Carbohydrate calculation 1. Identify the foods that contain carbohydrates: ? Rice. ? Corn. ? Milk. ? Strawberries. 2. Calculate how many servings you have of each food: ? 2 servings rice. ? 1 serving corn. ? 1 serving milk. ? 1 serving strawberries. 3. Multiply each number of servings by 15 g: ? 2 servings rice x 15 g = 30 g. ? 1 serving corn x 15 g = 15 g. ? 1 serving milk x 15 g = 15 g. ? 1 serving  strawberries x 15 g = 15 g. 4. Add together all of the amounts to find the total grams of carbohydrates eaten: ? 30 g + 15 g + 15 g + 15 g = 75 g of carbohydrates total. Summary  Carbohydrate counting is a method of keeping track of how many carbohydrates you eat.  Eating carbohydrates naturally increases the amount of sugar (glucose) in the blood.  Counting how many carbohydrates you eat helps keep your blood glucose within normal limits, which helps you manage your diabetes.  A diet and nutrition specialist (registered dietitian) can help you make a meal plan and calculate how many carbohydrates you should have at each meal and snack. This information is not intended to replace advice given to you by your health care provider. Make sure you discuss any questions you have with your health care provider. Document Released: 02/06/2005 Document Revised: 08/31/2016 Document Reviewed: 07/21/2015 Elsevier Patient Education  2020 Elsevier Inc.  

## 2019-01-29 NOTE — Assessment & Plan Note (Signed)
Check B12 level today, history of low levels.

## 2019-01-30 LAB — VITAMIN B12: Vitamin B-12: 393 pg/mL (ref 232–1245)

## 2019-01-30 LAB — COMPREHENSIVE METABOLIC PANEL
ALT: 14 IU/L (ref 0–44)
AST: 13 IU/L (ref 0–40)
Albumin/Globulin Ratio: 2.1 (ref 1.2–2.2)
Albumin: 4.5 g/dL (ref 3.7–4.7)
Alkaline Phosphatase: 59 IU/L (ref 39–117)
BUN/Creatinine Ratio: 23 (ref 10–24)
BUN: 18 mg/dL (ref 8–27)
Bilirubin Total: 0.6 mg/dL (ref 0.0–1.2)
CO2: 24 mmol/L (ref 20–29)
Calcium: 9.5 mg/dL (ref 8.6–10.2)
Chloride: 98 mmol/L (ref 96–106)
Creatinine, Ser: 0.79 mg/dL (ref 0.76–1.27)
GFR calc Af Amer: 103 mL/min/{1.73_m2} (ref >59)
GFR calc non Af Amer: 89 mL/min/{1.73_m2} (ref >59)
Globulin, Total: 2.1 g/dL (ref 1.5–4.5)
Glucose: 160 mg/dL — ABNORMAL HIGH (ref 65–99)
Potassium: 4.1 mmol/L (ref 3.5–5.2)
Sodium: 138 mmol/L (ref 134–144)
Total Protein: 6.6 g/dL (ref 6.0–8.5)

## 2019-01-30 LAB — LIPID PANEL W/O CHOL/HDL RATIO
Cholesterol, Total: 125 mg/dL (ref 100–199)
HDL: 37 mg/dL — ABNORMAL LOW (ref >39)
LDL Chol Calc (NIH): 59 mg/dL (ref 0–99)
Triglycerides: 172 mg/dL — ABNORMAL HIGH (ref 0–149)
VLDL Cholesterol Cal: 29 mg/dL (ref 5–40)

## 2019-02-10 ENCOUNTER — Encounter: Payer: Self-pay | Admitting: Family Medicine

## 2019-02-24 ENCOUNTER — Telehealth: Payer: Self-pay | Admitting: Family Medicine

## 2019-02-24 NOTE — Chronic Care Management (AMB) (Signed)
  Chronic Care Management   Outreach Note  02/24/2019 Name: Tyler Morgan MRN: [id number] DOB: Jun 09, 1945  Referred by: Guadalupe Maple, MD Reason for referral : Chronic Care Management (Initial CCM outreach. )   An  telephone outreach was attempted today. Patient has requested for me to call him back on 02/25/2019 after 2:30pm to discuss. The patient was referred to the case management team by for assistance with care management and care coordination.   Follow Up Plan: The care management team will reach out to the patient again over the next 1 days.   [place], [place] [address] Direct Dial: [email or web address]  Website: [email or web address]

## 2019-03-05 DIAGNOSIS — E119 Type 2 diabetes mellitus without complications: Secondary | ICD-10-CM | POA: Diagnosis not present

## 2019-03-05 LAB — HM DIABETES EYE EXAM

## 2019-03-05 NOTE — Chronic Care Management (AMB) (Signed)
  Chronic Care Management   Outreach Note  03/05/2019 Name: Tyler Morgan MRN: [id number] DOB: 12/08/1945  Referred by: Guadalupe Maple, MD Reason for referral : Chronic Care Management (Initial CCM outreach. ) and Chronic Care Management (Second CCM outreach was unsuccessful. )   A second unsuccessful telephone outreach was attempted today. The patient was referred to the case management team for assistance with care management and care coordination.   Follow Up Plan: A HIPPA compliant phone message was left for the patient providing contact information and requesting a return call.  The care management team will reach out to the patient again over the next 7 days.  If patient returns call to provider office, please advise to call [place] [facility] [place], [place] Management  [place], [place] [address] Direct Dial: [email or web address]  Website: [email or web address]

## 2019-03-13 NOTE — Chronic Care Management (AMB) (Signed)
  Chronic Care Management   Outreach Note  03/13/2019 Name: Tyler Morgan MRN: [id number] DOB: 1946-01-11  Referred by: Guadalupe Maple, MD Reason for referral : Chronic Care Management (Initial CCM outreach. ), Chronic Care Management (Second CCM outreach was unsuccessful. ), and Chronic Care Management (third CCM outreach was unsuccessful )   Third unsuccessful telephone outreach was attempted today. The patient was referred to the case management team for assistance with care management and care coordination. The patient's primary care provider has been notified of our unsuccessful attempts to make or maintain contact with the patient. The care management team is pleased to engage with this patient at any time in the future should he/she be interested in assistance from the care management team.   Follow Up Plan: The care management team is available to follow up with the patient after provider conversation with the patient regarding recommendation for care management engagement and subsequent re-referral to the care management team.   [place], [place] [facility]  [place], [place] [address] Direct Dial: [email or web address]  Website: [email or web address]

## 2019-03-19 NOTE — Chronic Care Management (AMB) (Signed)
  Chronic Care Management   Note  03/19/2019 Name: Tyler Morgan MRN: [id number] DOB: 04/05/1945  [name] is a 74 y.o. year old male who is a primary care patient of Crissman, Jeannette How, MD. I reached out to [name] by phone today in response to a referral sent by Tyler Morgan's health plan.     Tyler Morgan was given information about Chronic Care Management services today including:  1. CCM service includes personalized support from designated clinical staff supervised by his physician, including individualized plan of care and coordination with other care providers 2. 24/7 contact phone numbers for assistance for urgent and routine care needs. 3. Service will only be billed when office clinical staff spend 20 minutes or more in a month to coordinate care. 4. Only one practitioner may furnish and bill the service in a calendar month. 5. The patient may stop CCM services at any time (effective at the end of the month) by phone call to the office staff. 6. The patient will be responsible for cost sharing (co-pay) of up to 20% of the service fee (after annual deductible is met).  Patient agreed to services and verbal consent obtained.   Follow up plan: Telephone appointment with care management team member scheduled for: 04/23/2019  Noreene Larsson, [place], [place], [place] [address] Direct Dial: [phone] [email or web address] Website: [email or web address]

## 2019-03-25 ENCOUNTER — Other Ambulatory Visit: Payer: Self-pay

## 2019-03-25 ENCOUNTER — Other Ambulatory Visit: Payer: Medicare Other

## 2019-03-25 DIAGNOSIS — C61 Malignant neoplasm of prostate: Secondary | ICD-10-CM

## 2019-03-26 LAB — PSA: Prostate Specific Ag, Serum: 4.8 ng/mL — ABNORMAL HIGH (ref 0.0–4.0)

## 2019-04-07 ENCOUNTER — Telehealth: Payer: Self-pay | Admitting: *Deleted

## 2019-04-07 DIAGNOSIS — R972 Elevated prostate specific antigen [PSA]: Secondary | ICD-10-CM

## 2019-04-07 NOTE — Telephone Encounter (Addendum)
Patient notified, lab scheduled and follow up scheduled. Voiced understanding.    ----- Message from Hollice Espy, MD sent at 04/03/2019  2:48 PM EST ----- Please let him know that his PSA is stable.  I like to see him 3 months from now with another PSA in the office.  Please schedule.

## 2019-04-07 NOTE — Addendum Note (Signed)
Addended by: Verlene Mayer A on: 04/07/2019 09:06 AM   Modules accepted: Orders

## 2019-04-21 ENCOUNTER — Telehealth: Payer: Self-pay | Admitting: Family Medicine

## 2019-04-21 NOTE — Chronic Care Management (AMB) (Signed)
°  Care Management   Note  04/21/2019 Name: JEREMIH GERLICH MRN: [id number] DOB: 03-05-1945  CLIF DETHOMAS is a 74 y.o. year old male who is a primary care patient of Jeananne Rama, Jeannette How, MD and is actively engaged with the care management team. I reached out to [name] by phone today to assist with re-scheduling an initial visit with the Pharmacist  Follow up plan: Spoke with patient to r/s appt scheduled on 04/23/2019 Per Pharm D and he asked me to call him back next week to r/s due to his schedule. The care management team will reach out to the patient again over the next 7 days.   Noreene Larsson, [place], [place], [place] [address] Direct Dial: [phone] [email or web address] Website: [email or web address]

## 2019-04-23 ENCOUNTER — Telehealth: Payer: Medicare Other

## 2019-04-24 ENCOUNTER — Other Ambulatory Visit: Payer: Medicare Other

## 2019-04-29 ENCOUNTER — Ambulatory Visit: Payer: Medicare Other | Admitting: Urology

## 2019-04-29 ENCOUNTER — Ambulatory Visit: Payer: Medicare Other | Admitting: Nurse Practitioner

## 2019-04-30 ENCOUNTER — Other Ambulatory Visit: Payer: Self-pay | Admitting: Family Medicine

## 2019-04-30 DIAGNOSIS — I10 Essential (primary) hypertension: Secondary | ICD-10-CM

## 2019-05-01 NOTE — Chronic Care Management (AMB) (Signed)
  Care Management   Note  05/01/2019 Name: Tyler Morgan MRN: [id number] DOB: 06/06/1945  Tyler Morgan is a 74 y.o. year old male who is a primary care patient of Crissman, Jeannette How, MD and is actively engaged with the care management team. I reached out to [name] by phone today to assist with re-scheduling an initial visit with the Pharmacist  Follow up plan: Telephone appointment with care management team member scheduled for:06/18/2019  Noreene Larsson, [place], [place],  [address] Direct Dial: [phone] [email or web address] Website: [email or web address]

## 2019-05-21 ENCOUNTER — Ambulatory Visit: Payer: Medicare Other | Admitting: Nurse Practitioner

## 2019-05-26 ENCOUNTER — Encounter: Payer: Self-pay | Admitting: Unknown Physician Specialty

## 2019-05-26 ENCOUNTER — Ambulatory Visit (INDEPENDENT_AMBULATORY_CARE_PROVIDER_SITE_OTHER): Payer: Medicare Other | Admitting: Unknown Physician Specialty

## 2019-05-26 ENCOUNTER — Other Ambulatory Visit: Payer: Self-pay

## 2019-05-26 VITALS — BP 108/61 | HR 61 | Temp 98.3°F | Wt 189.2 lb

## 2019-05-26 DIAGNOSIS — E119 Type 2 diabetes mellitus without complications: Secondary | ICD-10-CM

## 2019-05-26 DIAGNOSIS — IMO0002 Reserved for concepts with insufficient information to code with codable children: Secondary | ICD-10-CM

## 2019-05-26 DIAGNOSIS — E1349 Other specified diabetes mellitus with other diabetic neurological complication: Secondary | ICD-10-CM | POA: Diagnosis not present

## 2019-05-26 DIAGNOSIS — E538 Deficiency of other specified B group vitamins: Secondary | ICD-10-CM | POA: Diagnosis not present

## 2019-05-26 DIAGNOSIS — E1365 Other specified diabetes mellitus with hyperglycemia: Secondary | ICD-10-CM

## 2019-05-26 DIAGNOSIS — E1159 Type 2 diabetes mellitus with other circulatory complications: Secondary | ICD-10-CM

## 2019-05-26 DIAGNOSIS — I10 Essential (primary) hypertension: Secondary | ICD-10-CM

## 2019-05-26 LAB — BAYER DCA HB A1C WAIVED: HB A1C (BAYER DCA - WAIVED): 7.7 % — ABNORMAL HIGH (ref <7.0)

## 2019-05-26 MED ORDER — METFORMIN HCL 500 MG PO TABS: 500.0000 mg | ORAL_TABLET | Freq: Three times a day (TID) | ORAL | 4 refills | Status: DC

## 2019-05-26 NOTE — Assessment & Plan Note (Addendum)
Hgb A1C is 7.7% Not to goal.  Discussed additional medications vs lifestyle.  Pt would like to increase exercise and increase Metformin to 1,000 mg BID.

## 2019-05-26 NOTE — Progress Notes (Signed)
BP 108/61   Pulse 61   Temp 98.3 F (36.8 C) (Oral)   Wt 189 lb 3.2 oz (85.8 kg)   SpO2 96%   BMI 24.96 kg/m    Subjective:    Patient ID: Tyler Morgan, male    DOB: 1946/01/11, 74 y.o.   MRN: [id number]  HPI: Tyler Morgan is a 74 y.o. male  Chief Complaint  Patient presents with  . Diabetes  . Hyperlipidemia  . Hypertension   Diabetes: Using medications without difficulties No hypoglycemic episodes No hyperglycemic episodes Feet problems: numbness bilateral feet.  Has been to see neurology.  Diagnosed with peripheral neuropathy.  Unable to get on tip toes.   Blood Sugars averaging: not checking eye exam within last year: Yes Last Hgb A1C: 7.8 Cutting back on sugar and carbs  Was on Jardiance in the past but lost too much weight.    Hypertension  Using medications without difficulty Average home BPs   Using medication without problems or lightheadedness No chest pain with exertion or shortness of breath No Edema  Elevated Cholesterol Using medications without problems No Muscle aches  Diet: Cutting back on carbs Exercise:Doesn't exercise but does yard work   B12 deficiency Helps some with the neuropathy.  Noticed it made a difference when he ran out.    Relevant past medical, surgical, family and social history reviewed and updated as indicated. Interim medical history since our last visit reviewed. Allergies and medications reviewed and updated.  Review of Systems  Per HPI unless specifically indicated above     Objective:    BP 108/61   Pulse 61   Temp 98.3 F (36.8 C) (Oral)   Wt 189 lb 3.2 oz (85.8 kg)   SpO2 96%   BMI 24.96 kg/m   Wt Readings from Last 3 Encounters:  05/26/19 189 lb 3.2 oz (85.8 kg)  12/25/18 189 lb (85.7 kg)  12/11/18 191 lb (86.6 kg)    Physical Exam Constitutional:      General: He is not in acute distress.    Appearance: Normal appearance. He is well-developed.  HENT:     Head: Normocephalic and  atraumatic.  Eyes:     General: Lids are normal. No scleral icterus.       Right eye: No discharge.        Left eye: No discharge.     Conjunctiva/sclera: Conjunctivae normal.  Neck:     Vascular: No carotid bruit or JVD.  Cardiovascular:     Rate and Rhythm: Normal rate and regular rhythm.     Heart sounds: Normal heart sounds.  Pulmonary:     Effort: Pulmonary effort is normal. No respiratory distress.     Breath sounds: Normal breath sounds.  Abdominal:     Palpations: There is no hepatomegaly or splenomegaly.  Musculoskeletal:        General: Normal range of motion.     Cervical back: Normal range of motion and neck supple.  Skin:    General: Skin is warm and dry.     Coloration: Skin is not pale.     Findings: No rash.  Neurological:     Mental Status: He is alert and oriented to person, place, and time.  Psychiatric:        Behavior: Behavior normal.        Thought Content: Thought content normal.        Judgment: Judgment normal.     Results for orders placed or  performed in visit on 03/25/19  PSA  Result Value Ref Range   Prostate Specific Ag, Serum 4.8 (H) 0.0 - 4.0 ng/mL      Assessment & Plan:   Problem List Items Addressed This Visit      Unprioritized   B12 deficiency    Check B12 level today      Relevant Orders   Vitamin B12   DM (diabetes mellitus), secondary, uncontrolled, w/neurologic complic ([place]) - Primary    Hgb A1C is 7.7% Not to goal.  Discussed additional medications vs lifestyle.  Pt would like to increase exercise and increase Metformin to 1,000 mg BID.        Relevant Medications   metFORMIN (GLUCOPHAGE) 500 MG tablet   Other Relevant Orders   Bayer DCA Hb A1c Waived   Comprehensive metabolic panel   Hypertension associated with diabetes ([place])    Stable, continue present medications.        Relevant Medications   metFORMIN (GLUCOPHAGE) 500 MG tablet    Other Visit Diagnoses    Type 2 diabetes mellitus without complication,  without long-term current use of insulin (HCC)       Relevant Medications   metFORMIN (GLUCOPHAGE) 500 MG tablet       Follow up plan: Return in about 3 months (around 08/25/2019).

## 2019-05-26 NOTE — Assessment & Plan Note (Signed)
Stable, continue present medications.   

## 2019-05-26 NOTE — Assessment & Plan Note (Signed)
Check B12 level today.  

## 2019-05-27 LAB — COMPREHENSIVE METABOLIC PANEL
ALT: 14 IU/L (ref 0–44)
AST: 19 IU/L (ref 0–40)
Albumin/Globulin Ratio: 2 (ref 1.2–2.2)
Albumin: 4.4 g/dL (ref 3.7–4.7)
Alkaline Phosphatase: 53 IU/L (ref 39–117)
BUN/Creatinine Ratio: 21 (ref 10–24)
BUN: 16 mg/dL (ref 8–27)
Bilirubin Total: 0.6 mg/dL (ref 0.0–1.2)
CO2: 24 mmol/L (ref 20–29)
Calcium: 9.3 mg/dL (ref 8.6–10.2)
Chloride: 98 mmol/L (ref 96–106)
Creatinine, Ser: 0.75 mg/dL — ABNORMAL LOW (ref 0.76–1.27)
GFR calc Af Amer: 105 mL/min/{1.73_m2} (ref >59)
GFR calc non Af Amer: 91 mL/min/{1.73_m2} (ref >59)
Globulin, Total: 2.2 g/dL (ref 1.5–4.5)
Glucose: 156 mg/dL — ABNORMAL HIGH (ref 65–99)
Potassium: 3.6 mmol/L (ref 3.5–5.2)
Sodium: 140 mmol/L (ref 134–144)
Total Protein: 6.6 g/dL (ref 6.0–8.5)

## 2019-05-27 LAB — VITAMIN B12: Vitamin B-12: 684 pg/mL (ref 232–1245)

## 2019-06-18 ENCOUNTER — Ambulatory Visit (INDEPENDENT_AMBULATORY_CARE_PROVIDER_SITE_OTHER): Payer: Medicare Other | Admitting: Pharmacist

## 2019-06-18 DIAGNOSIS — IMO0002 Reserved for concepts with insufficient information to code with codable children: Secondary | ICD-10-CM

## 2019-06-18 DIAGNOSIS — E785 Hyperlipidemia, unspecified: Secondary | ICD-10-CM

## 2019-06-18 DIAGNOSIS — I152 Hypertension secondary to endocrine disorders: Secondary | ICD-10-CM

## 2019-06-18 DIAGNOSIS — I10 Essential (primary) hypertension: Secondary | ICD-10-CM

## 2019-06-18 DIAGNOSIS — E1169 Type 2 diabetes mellitus with other specified complication: Secondary | ICD-10-CM

## 2019-06-18 DIAGNOSIS — E1159 Type 2 diabetes mellitus with other circulatory complications: Secondary | ICD-10-CM

## 2019-06-18 DIAGNOSIS — E1365 Other specified diabetes mellitus with hyperglycemia: Secondary | ICD-10-CM

## 2019-06-18 DIAGNOSIS — E1349 Other specified diabetes mellitus with other diabetic neurological complication: Secondary | ICD-10-CM | POA: Diagnosis not present

## 2019-06-18 NOTE — Chronic Care Management (AMB) (Signed)
Chronic Care Management   Note  06/18/2019 Name: Tyler Morgan MRN: [id number] DOB: 10-06-1945   Subjective:  Tyler Morgan is a 74 y.o. year old male who is a primary care patient of Crissman, Jeannette How, MD. The CCM team was consulted for assistance with chronic disease management and care coordination needs.    Received referral from patient's insurance company for [employer]. Contacted for medication management review.   Review of patient status, including review of consultants reports, laboratory and other test data, was performed as part of comprehensive evaluation and provision of chronic care management services.   SDOH (Social Determinants of Health) assessments and interventions performed:  no  Objective:  Lab Results  Component Value Date   CREATININE 0.75 (L) 05/26/2019   CREATININE 0.79 01/29/2019   CREATININE 0.70 12/05/2018    Lab Results  Component Value Date   HGBA1C 7.7 (H) 05/26/2019       Component Value Date/Time   CHOL 125 01/29/2019 1416   CHOL 131 07/30/2018 1034   TRIG 172 (H) 01/29/2019 1416   TRIG 121 07/30/2018 1034   HDL 37 (L) 01/29/2019 1416   CHOLHDL 4.8 10/11/2016 1556   VLDL 24 07/30/2018 1034   LDLCALC 59 01/29/2019 1416    Clinical ASCVD: No  The ASCVD Risk score Mikey Bussing DC Jr., et al., 2013) failed to calculate for the following reasons:   The valid total cholesterol range is 130 to 320 mg/dL    BP Readings from Last 3 Encounters:  05/26/19 108/61  01/29/19 121/74  12/25/18 132/76    Allergies  Allergen Reactions  . Acetaminophen Swelling and Other (See Comments)    Swelling of liver   . Amlodipine Swelling    Medications Reviewed Today    Reviewed by De Hollingshead, [facility] (Pharmacist) on 06/18/19 at [place] [facility] Status: <None>  Medication Order Taking? Sig Documenting Provider Last Dose Status Informant  atenolol (TENORMIN) 50 MG tablet [id number] Yes TAKE ONE TABLET BY MOUTH EVERY DAY Guadalupe Maple, MD  Taking Active   atorvastatin (LIPITOR) 20 MG tablet [id number] Yes TAKE ONE TABLET EVERY DAY Crissman, Jeannette How, MD Taking Active   benazepril (LOTENSIN) 40 MG tablet [id number] Yes TAKE 1 TABLET BY MOUTH DAILY Johnson, Megan P, DO Taking Active   Cholecalciferol (VITAMIN D-3) 1000 units CAPS [id number] Yes Take 1,000 Units by mouth daily.  [provider] Taking Active Self  hydrochlorothiazide (HYDRODIURIL) 25 MG tablet [id number] Yes TAKE ONE TABLET BY MOUTH EVERY DAY Crissman, Jeannette How, MD Taking Active   metFORMIN (GLUCOPHAGE) 500 MG tablet [id number] Yes Take 1 tablet (500 mg total) by mouth 3 (three) times daily. Kathrine Haddock, NP Taking Active   TURMERIC PO [id number] Yes Take 1 capsule by mouth daily.  [provider] Taking Active Self  vitamin B-12 (CYANOCOBALAMIN) 1000 MCG tablet [id number] Yes Take 1,000 mcg by mouth daily. [provider] Taking Active            Assessment:   Goals Addressed            This Visit's Progress     Patient Stated   . COMPLETED: PharmD "I want to stay healthy" (pt-stated)       CARE PLAN ENTRY (see longtitudinal plan of care for additional care plan information)  Current Barriers:  . Diabetes: managed with room to improve; complicated by chronic medical conditions including hypertension, hyperlipidemia, most recent A1c 7.7% . Most recent eGFR: 91 . Current antihyperglycemic  regimen: metformin 500 mg TID- discussed increasing to total of 2000 mg daily w/ last PCP appt, but patient declined to focus on diet/exercise first  o Patient notes he previously was on Jardiance, but lost too much weight so this was stopped . Current meal patterns: endorses low carb, low fat diet o Breakfast: 2 eggs, toast, coffee o Lunch: varies - low carb tortillas with pastrami and cheese o Supper: spaghetti squash, cauliflower rice . Current blood glucose readings: not currently taking . Current blood pressure readings:  o Measures at home  and reports readings are always <120/<80 mmHg . Cardiovascular risk reduction: o Current hypertensive regimen: atenolol 58m daily, HCTZ 28mdaily, benazepril 4061maily o Current hyperlipidemia regimen: atorvastatin 46m15mily; last LDL 59 o Current antiplatelet regimen: none, no indication  Pharmacist Clinical Goal(s):  . OvMarland Kitchenr the next 90 days, patient will work with PharmD and primary care provider to address diabetes, HTN, and hyperlipidemia management  Interventions: . Comprehensive medication review performed, medication list updated in electronic medical record . Inter-disciplinary care team collaboration (see longitudinal plan of care) . Reviewed goal A1c, goal fasting, and goal 2 hour post prandial glucose.  . DiMarland Kitchencussed that if next A1c is not controlled, consider maximizing metformin. Appears patient had problems on Jardiance 25 mg, could also consider Jardiance 10 mg daily if maximized metformin therapy does not control sugars . Discussed goal BP . Discussed goal lipids . Patient declines further questions or CCM needs  Patient Self Care Activities:  . Patient will check blood glucose periodically , document, and provide at future appointments . Patient will take medications as prescribed . Patient will report any questions or concerns to provider   Initial goal documentation        Plan: - Patient has my contact information for future questions or concerns  Catie TravDarnelle MaffucciarmD, BCACHaines-[phone]

## 2019-06-18 NOTE — Patient Instructions (Signed)
Visit Information  Goals Addressed            This Visit's Progress     Patient Stated   . COMPLETED: PharmD "I want to stay healthy" (pt-stated)       CARE PLAN ENTRY (see longtitudinal plan of care for additional care plan information)  Current Barriers:  . Diabetes: managed with room to improve; complicated by chronic medical conditions including hypertension, hyperlipidemia, most recent A1c 7.7% . Most recent eGFR: 91 . Current antihyperglycemic regimen: metformin 500 mg TID- discussed increasing to total of 2000 mg daily w/ last PCP appt, but patient declined to focus on diet/exercise first  o Patient notes he previously was on Jardiance, but lost too much weight so this was stopped . Current meal patterns: endorses low carb, low fat diet o Breakfast: 2 eggs, toast, coffee o Lunch: varies - low carb tortillas with pastrami and cheese o Supper: spaghetti squash, cauliflower rice . Current blood glucose readings: not currently taking . Current blood pressure readings:  o Measures at home and reports readings are always <120/<80 mmHg . Cardiovascular risk reduction: o Current hypertensive regimen: atenolol 44m daily, HCTZ 29mdaily, benazepril 4040maily o Current hyperlipidemia regimen: atorvastatin 33m62mily; last LDL 59 o Current antiplatelet regimen: none, no indication  Pharmacist Clinical Goal(s):  . OvMarland Kitchenr the next 90 days, patient will work with PharmD and primary care provider to address diabetes, HTN, and hyperlipidemia management  Interventions: . Comprehensive medication review performed, medication list updated in electronic medical record . Inter-disciplinary care team collaboration (see longitudinal plan of care) . Reviewed goal A1c, goal fasting, and goal 2 hour post prandial glucose.  . DiMarland Kitchencussed that if next A1c is not controlled, consider maximizing metformin. Appears patient had problems on Jardiance 25 mg, could also consider Jardiance 10 mg daily if  maximized metformin therapy does not control sugars . Discussed goal BP . Discussed goal lipids . Patient declines further questions or CCM needs  Patient Self Care Activities:  . Patient will check blood glucose periodically , document, and provide at future appointments . Patient will take medications as prescribed . Patient will report any questions or concerns to provider   Initial goal documentation        Patient verbalizes understanding of instructions provided today.   Plan: - Patient has my contact information for future questions or concerns  Catie TravDarnelle MaffucciarmD, BCACLimaville-[phone]

## 2019-07-07 ENCOUNTER — Other Ambulatory Visit: Payer: Medicare Other

## 2019-07-07 ENCOUNTER — Other Ambulatory Visit: Payer: Self-pay | Admitting: Family Medicine

## 2019-07-07 ENCOUNTER — Other Ambulatory Visit: Payer: Self-pay

## 2019-07-07 DIAGNOSIS — R972 Elevated prostate specific antigen [PSA]: Secondary | ICD-10-CM

## 2019-07-08 LAB — PSA: Prostate Specific Ag, Serum: 2.9 ng/mL (ref 0.0–4.0)

## 2019-07-09 ENCOUNTER — Ambulatory Visit: Payer: Self-pay | Admitting: Urology

## 2019-07-21 NOTE — Progress Notes (Signed)
07/22/19 9:14 PM   Tyler Morgan 02-24-1945 [id number]  Referring provider: Guadalupe Maple, MD No address on file Chief Complaint  Patient presents with  . Prostate Cancer    HPI: Tyler Morgan is a 74 y.o. M who returns today for the evaluation and management of prostate cancer.   He has a personal history of massive BPH S/plaser nucleation of the prostate 01/2017 with incidental low risk prostate cancer returns today for surveillance. A total of 114 g were resected. 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  He does have a personal history of elevated PSA. He is undergone biopsy in the past which was negative (~10+years ago with Dr. Bernardo Heater, records not available). PreopPSA 8.5 on 10/11/16.  His initial postoperative PSA was 0.9 08/2017 on finasteride.  He has since stopped this medication and his PSA continues to rise, most recently up to 4.8 as of 10/23/2018.  As further evaluation for this, he underwent prostate MRI on 12/05/2018.  This revealed a 2.2 cm peripheral zone nodule at the left lateral and posterior mid apex which was read as of PI-RADS 5 lesion.  No evidence of extracapsular extension or neurovascular involvement.  No lymphadenopathy.  Estimated residual prostatic volume 74 cc.  He underwent prostate biopsy which was a "cognitive fusion" biopsy which did in fact demonstrate some prostatic asymmetry, left greater than right.  Several additional samples of the left area of interest were biopsied but combined with the usual 12 core biopsy.  No complications.  Prostate biopsy results are all benign.  There is evidence of both chronic and acute inflammation.  No malignancy was identified.  Most recent PSA 2.9 as of 07/07/19.   Overall, he is doing excellent.  He has no urinary complaints today as per previous.  He takes no BPH meds at  this time.  He has reduced his carb intake to control his diabetes.   Component     Latest Ref Rng & Units 07/21/2015 10/11/2016 09/19/2017 03/28/2018  Prostate Specific Ag, Serum     0.0 - 4.0 ng/mL 12.3 (H) 8.5 (H) 0.9 2.0   Component     Latest Ref Rng & Units 10/23/2018 11/20/2018 03/25/2019 07/07/2019  Prostate Specific Ag, Serum     0.0 - 4.0 ng/mL 4.8 (H) 4.9 (H) 4.8 (H) 2.9    PMH: Past Medical History:  Diagnosis Date  . Diabetes mellitus without complication ([place])   . ED (erectile dysfunction)   . Hypertension   . Motion sickness    boats  . Neuropathy   . Prostate enlargement   . Urinary retention     Surgical History: Past Surgical History:  Procedure Laterality Date  . BASAL CELL CARCINOMA EXCISION     nose   . CATARACT EXTRACTION Bilateral 1986, 2014  . COLONOSCOPY WITH PROPOFOL N/A 03/27/2018   Procedure: COLONOSCOPY WITH BIOPSIES;  Surgeon: Lin Landsman, MD;  Location: [place] [facility];  Service: Endoscopy;  Laterality: N/A;  Diabetic - oral meds  . EYE SURGERY Bilateral [id number]  . FRACTURE SURGERY Right    as a child/ right arm  . HERNIA REPAIR Right 01-13-14   inguinal hernia  . 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 POLYPECTOMY N/A 03/27/2018  Procedure: POLYPECTOMY;  Surgeon: Lin Landsman, MD;  Location: [place];  Service: Endoscopy;  Laterality: N/A;  . RETINAL DETACHMENT SURGERY  Jan 2015  . TONSILLECTOMY  1955    Home Medications:  Allergies as of 07/22/2019      Reactions   Acetaminophen Swelling, Other (See Comments)   Swelling of liver    Amlodipine Swelling      Medication List       Accurate as of July 22, 2019  9:14 PM. If you have any questions, ask your nurse or doctor.        atenolol 50 MG tablet Commonly known  as: TENORMIN TAKE ONE TABLET BY MOUTH EVERY DAY   atorvastatin 20 MG tablet Commonly known as: LIPITOR TAKE ONE TABLET EVERY DAY   benazepril 40 MG tablet Commonly known as: LOTENSIN TAKE 1 TABLET BY MOUTH DAILY   hydrochlorothiazide 25 MG tablet Commonly known as: HYDRODIURIL TAKE ONE TABLET BY MOUTH EVERY DAY   metFORMIN 500 MG tablet Commonly known as: GLUCOPHAGE Take 1 tablet (500 mg total) by mouth 3 (three) times daily.   TURMERIC PO Take 1 capsule by mouth daily.   vitamin B-12 1000 MCG tablet Commonly known as: CYANOCOBALAMIN Take 1,000 mcg by mouth daily.   Vitamin D-3 25 MCG (1000 UT) Caps Take 1,000 Units by mouth daily.       Allergies:  Allergies  Allergen Reactions  . Acetaminophen Swelling and Other (See Comments)    Swelling of liver   . Amlodipine Swelling    Family History: Family History  Problem Relation Age of Onset  . Colon cancer Father   . Angina Father   . Macular degeneration Father   . Alzheimer's disease Mother   . Prostate cancer Neg Hx   . Bladder Cancer Neg Hx   . Kidney cancer Neg Hx     Social History:  reports that he quit smoking about 35 years ago. His smoking use included cigarettes. He has a 13.00 pack-year smoking history. He has never used smokeless tobacco. He reports current alcohol use of about 2.0 standard drinks of alcohol per week. He reports that he does not use drugs.   Physical Exam: BP 98/61   Pulse 73   Ht 6\' 1"  (1.854 m)   Wt 181 lb (82.1 kg)   BMI 23.88 kg/m   Constitutional:  Alert and oriented, No acute distress. HEENT: [place] AT, moist mucus membranes.  Trachea midline, no masses. Cardiovascular: No clubbing, cyanosis, or edema. Respiratory: Normal respiratory effort, no increased work of breathing. Skin: No rashes, bruises or suspicious lesions. Neurologic: Grossly intact, no focal deficits, moving all 4 extremities. Psychiatric: Normal mood and affect.  Laboratory Data:  Lab Results    Component Value Date   CREATININE 0.75 (L) 05/26/2019   Lab Results  Component Value Date   HGBA1C 7.7 (H) 05/26/2019   Assessment & Plan:    1. Prostate cancer  Personal history of incidental low volume low risk prostate cancer at the time of enucleation 2018 PSA down to 2.9 as of 07/07/19  As per time, would recommend continued observation.  We will plan to recheck his PSA in 6 months and reassess at that time. He is agreeable this plan.  2. Rising PSA level As above  3. History of BPH Asymptomatic on no BPH meds  6 months PSA  [facility] [address] [place], [place] [address] [phone]  I, Lucas Mallow, am acting  as a scribe for Dr. Hollice Espy,  I have reviewed the above documentation for accuracy and completeness, and I agree with the above.   Hollice Espy, MD

## 2019-07-22 ENCOUNTER — Other Ambulatory Visit: Payer: Self-pay

## 2019-07-22 ENCOUNTER — Ambulatory Visit (INDEPENDENT_AMBULATORY_CARE_PROVIDER_SITE_OTHER): Payer: Medicare Other | Admitting: Urology

## 2019-07-22 VITALS — BP 98/61 | HR 73 | Ht 73.0 in | Wt 181.0 lb

## 2019-07-22 DIAGNOSIS — N401 Enlarged prostate with lower urinary tract symptoms: Secondary | ICD-10-CM

## 2019-07-22 DIAGNOSIS — R350 Frequency of micturition: Secondary | ICD-10-CM | POA: Diagnosis not present

## 2019-07-22 DIAGNOSIS — C61 Malignant neoplasm of prostate: Secondary | ICD-10-CM

## 2019-07-22 DIAGNOSIS — R972 Elevated prostate specific antigen [PSA]: Secondary | ICD-10-CM

## 2019-07-29 ENCOUNTER — Other Ambulatory Visit: Payer: Self-pay | Admitting: Family Medicine

## 2019-07-29 DIAGNOSIS — I10 Essential (primary) hypertension: Secondary | ICD-10-CM

## 2019-08-26 ENCOUNTER — Ambulatory Visit (INDEPENDENT_AMBULATORY_CARE_PROVIDER_SITE_OTHER): Payer: Medicare Other | Admitting: Nurse Practitioner

## 2019-08-26 ENCOUNTER — Other Ambulatory Visit: Payer: Self-pay

## 2019-08-26 ENCOUNTER — Encounter: Payer: Self-pay | Admitting: Nurse Practitioner

## 2019-08-26 VITALS — BP 105/64 | HR 66 | Temp 98.3°F | Wt 182.8 lb

## 2019-08-26 DIAGNOSIS — E1169 Type 2 diabetes mellitus with other specified complication: Secondary | ICD-10-CM

## 2019-08-26 DIAGNOSIS — E1159 Type 2 diabetes mellitus with other circulatory complications: Secondary | ICD-10-CM | POA: Diagnosis not present

## 2019-08-26 DIAGNOSIS — E1365 Other specified diabetes mellitus with hyperglycemia: Secondary | ICD-10-CM | POA: Diagnosis not present

## 2019-08-26 DIAGNOSIS — I10 Essential (primary) hypertension: Secondary | ICD-10-CM

## 2019-08-26 DIAGNOSIS — C61 Malignant neoplasm of prostate: Secondary | ICD-10-CM

## 2019-08-26 DIAGNOSIS — E785 Hyperlipidemia, unspecified: Secondary | ICD-10-CM | POA: Diagnosis not present

## 2019-08-26 DIAGNOSIS — E1349 Other specified diabetes mellitus with other diabetic neurological complication: Secondary | ICD-10-CM

## 2019-08-26 DIAGNOSIS — IMO0002 Reserved for concepts with insufficient information to code with codable children: Secondary | ICD-10-CM

## 2019-08-26 DIAGNOSIS — R509 Fever, unspecified: Secondary | ICD-10-CM | POA: Insufficient documentation

## 2019-08-26 DIAGNOSIS — I152 Hypertension secondary to endocrine disorders: Secondary | ICD-10-CM

## 2019-08-26 LAB — MICROALBUMIN, URINE WAIVED
Creatinine, Urine Waived: 100 mg/dL (ref 10–300)
Microalb, Ur Waived: 10 mg/L (ref 0–19)
Microalb/Creat Ratio: <30 mg/g (ref <30)

## 2019-08-26 LAB — BAYER DCA HB A1C WAIVED: HB A1C (BAYER DCA - WAIVED): 6.1 % (ref <7.0)

## 2019-08-26 NOTE — Assessment & Plan Note (Signed)
Recent visit with Dr. Brandon and was told is cancer free.  Continue collaboration with urology. 

## 2019-08-26 NOTE — Assessment & Plan Note (Signed)
Chronic, stable with BP at goal in office.  Urine ALB 10 and A:C 30-300 today.  Continue current medication regimen with Benazepril offering kidney protection.  Recommend checking BP a few mornings a week at home.  BMP today.  Return in 3 months.

## 2019-08-26 NOTE — Assessment & Plan Note (Signed)
Two weeks ago x one incident and no further.  Suspect related to heat and working outside.  Recommend to increase fluid intake when working outside for hours and rest.  Return to office for symptom return.

## 2019-08-26 NOTE — Assessment & Plan Note (Signed)
Chronic, ongoing.  Continue current medication regimen and adjust as needed, recent LDL 70.  Check lipid panel today.

## 2019-08-26 NOTE — Patient Instructions (Signed)

## 2019-08-26 NOTE — Assessment & Plan Note (Signed)
Chronic, ongoing with A1C now trending downwards at 6.1%.  Continue Metformin TID and diet focus.  Recommend he check BS at least three mornings a week and document.  If elevation above goal A1C next check would consider increasing Metformin to max or adding SGLT.  Return in 3 months.

## 2019-08-26 NOTE — Progress Notes (Signed)
BP 105/64   Pulse 66   Temp 98.3 F (36.8 C) (Oral)   Wt 182 lb 12.8 oz (82.9 kg)   SpO2 97%   BMI 24.12 kg/m    Subjective:    Patient ID: Tyler Morgan, male    DOB: 1945-06-07, 74 y.o.   MRN: [id number]  HPI: Tyler Morgan is a 74 y.o. male  Chief Complaint  Patient presents with  . Diabetes  . Hyperlipidemia  . Hypertension  . Fever    pt states a few weeks back he spiked a fever of 101.6, states it went away the next day. but also had neck and upper back pain as well, but that he was moving dirt in the back yard as well    DIABETES Last A1C 7.7% and Metformin increased to 1000 MG BID, he has continued taking 500 MG TID, + focus on diet changes -- reports he has been decreasing sugar intake and carbs.  Was on Jardiance in past, on for less than a year.  Recent B12 level 684. Hypoglycemic episodes:no Polydipsia/polyuria: no Visual disturbance: no Chest pain: no Paresthesias: no Glucose Monitoring: no  Accucheck frequency: Not Checking  Fasting glucose:  Post prandial:  Evening:  Before meals: Taking Insulin?: no  Long acting insulin:  Short acting insulin: Blood Pressure Monitoring: rarely Retinal Examination: Not up to Date Foot Exam: Up to Date Pneumovax: Up to Date Influenza: Up to Date Aspirin: no   HYPERTENSION / HYPERLIPIDEMIA Continues on Benazepril, Atenolol, and HCTZ + Lipitor. Satisfied with current treatment? yes Duration of hypertension: chronic BP monitoring frequency: rarely BP range: <120/80 BP medication side effects: no Duration of hyperlipidemia: chronic Cholesterol medication side effects: no Cholesterol supplements: none Medication compliance: good compliance Aspirin: no Recent stressors: no Recurrent headaches: no Visual changes: no Palpitations: no Dyspnea: no Chest pain: no Lower extremity edema: no Dizzy/lightheaded: no   PROSTATE CANCER: Followed by Dr. Erlene Quan and last saw 07/22/2019, was told at recent visit  he was "cancer free".  Continues to have monitoring of PSA, every 6 months.  FEVER: Reports he had a fever for one day two weeks ago, 101.6.  Has had Covid vaccines, got second one first Tuesday in March.  He had been working outside for most of the week prior to the fever, digging and moving dirt.  When fever presented he had some back and neck pain, but had been working outside week prior.  Denies any other symptoms at time of fever -- no URI, UTI, or tick bites.  No further fevers since this one episode.  No B symptoms: night sweats, fever, unintended weight loss.  Relevant past medical, surgical, family and social history reviewed and updated as indicated. Interim medical history since our last visit reviewed. Allergies and medications reviewed and updated.  Review of Systems  Constitutional: Negative for activity change, diaphoresis, fatigue and fever.  Respiratory: Negative for cough, chest tightness, shortness of breath and wheezing.   Cardiovascular: Negative for chest pain, palpitations and leg swelling.  Gastrointestinal: Negative.   Endocrine: Negative for cold intolerance, heat intolerance, polydipsia, polyphagia and polyuria.  Neurological: Negative.   Psychiatric/Behavioral: Negative.     Per HPI unless specifically indicated above     Objective:    BP 105/64   Pulse 66   Temp 98.3 F (36.8 C) (Oral)   Wt 182 lb 12.8 oz (82.9 kg)   SpO2 97%   BMI 24.12 kg/m   Wt Readings from Last 3 Encounters:  08/26/19 182 lb 12.8 oz (82.9 kg)  07/22/19 181 lb (82.1 kg)  05/26/19 189 lb 3.2 oz (85.8 kg)    Physical Exam Vitals and nursing note reviewed.  Constitutional:      General: He is awake. He is not in acute distress.    Appearance: He is well-developed. He is not ill-appearing.  HENT:     Head: Normocephalic and atraumatic.     Right Ear: Hearing normal. No drainage.     Left Ear: Hearing normal. No drainage.  Eyes:     General: Lids are normal.        Right eye:  No discharge.        Left eye: No discharge.     Conjunctiva/sclera: Conjunctivae normal.     Pupils: Pupils are equal, round, and reactive to light.  Neck:     Thyroid: No thyromegaly.     Vascular: No carotid bruit.  Cardiovascular:     Rate and Rhythm: Normal rate and regular rhythm.     Heart sounds: Normal heart sounds, S1 normal and S2 normal. No murmur heard.  No gallop.   Pulmonary:     Effort: Pulmonary effort is normal. No accessory muscle usage or respiratory distress.     Breath sounds: Normal breath sounds.  Abdominal:     General: Bowel sounds are normal.     Palpations: Abdomen is soft.  Musculoskeletal:        General: Normal range of motion.     Cervical back: Normal range of motion and neck supple.     Right lower leg: No edema.     Left lower leg: No edema.  Skin:    General: Skin is warm and dry.  Neurological:     Mental Status: He is alert and oriented to person, place, and time.     Deep Tendon Reflexes: Reflexes are normal and symmetric.  Psychiatric:        Attention and Perception: Attention normal.        Mood and Affect: Mood normal.        Speech: Speech normal.        Behavior: Behavior normal. Behavior is cooperative.        Thought Content: Thought content normal.     Results for orders placed or performed in visit on 07/07/19  PSA  Result Value Ref Range   Prostate Specific Ag, Serum 2.9 0.0 - 4.0 ng/mL      Assessment & Plan:   Problem List Items Addressed This Visit      Cardiovascular and Mediastinum   Hypertension associated with diabetes ([place])    Chronic, stable with BP at goal in office.  Urine ALB 10 and A:C 30-300 today.  Continue current medication regimen with Benazepril offering kidney protection.  Recommend checking BP a few mornings a week at home.  BMP today.  Return in 3 months.      Relevant Orders   Bayer DCA Hb A1c Waived   Basic metabolic panel     Endocrine   DM (diabetes mellitus), secondary, uncontrolled,  w/neurologic complic ([place]) - Primary    Chronic, ongoing with A1C now trending downwards at 6.1%.  Continue Metformin TID and diet focus.  Recommend he check BS at least three mornings a week and document.  If elevation above goal A1C next check would consider increasing Metformin to max or adding SGLT.  Return in 3 months.      Relevant Orders   B[facility]  Hb A1c Waived   Microalbumin, Urine Waived   Hyperlipidemia associated with type 2 diabetes mellitus (HCC)    Chronic, ongoing.  Continue current medication regimen and adjust as needed, recent LDL 70.  Check lipid panel today.       Relevant Orders   Bayer DCA Hb A1c Waived   Lipid Panel w/o Chol/HDL Ratio     Genitourinary   Prostate cancer [facility])    Recent visit with Dr. Erlene Quan and was told is cancer free.  Continue collaboration with urology.        Other   Fever    Two weeks ago x one incident and no further.  Suspect related to heat and working outside.  Recommend to increase fluid intake when working outside for hours and rest.  Return to office for symptom return.          Follow up plan: Return in about 3 months (around 11/26/2019) for Annual physical.

## 2019-08-27 LAB — BASIC METABOLIC PANEL
BUN/Creatinine Ratio: 27 — ABNORMAL HIGH (ref 10–24)
BUN: 19 mg/dL (ref 8–27)
CO2: 23 mmol/L (ref 20–29)
Calcium: 9.2 mg/dL (ref 8.6–10.2)
Chloride: 103 mmol/L (ref 96–106)
Creatinine, Ser: 0.7 mg/dL — ABNORMAL LOW (ref 0.76–1.27)
GFR calc Af Amer: 108 mL/min/{1.73_m2} (ref >59)
GFR calc non Af Amer: 94 mL/min/{1.73_m2} (ref >59)
Glucose: 102 mg/dL — ABNORMAL HIGH (ref 65–99)
Potassium: 4.2 mmol/L (ref 3.5–5.2)
Sodium: 140 mmol/L (ref 134–144)

## 2019-08-27 LAB — LIPID PANEL W/O CHOL/HDL RATIO
Cholesterol, Total: 135 mg/dL (ref 100–199)
HDL: 45 mg/dL (ref >39)
LDL Chol Calc (NIH): 72 mg/dL (ref 0–99)
Triglycerides: 97 mg/dL (ref 0–149)
VLDL Cholesterol Cal: 18 mg/dL (ref 5–40)

## 2019-08-27 NOTE — Progress Notes (Signed)
Contacted via [place] morning!!  Your labs have returned -- Kidney function remains stable and cholesterol levels look good.  Continue current medication regimen.   Keep being awesome!! Kindest regards, Brantlee Penn

## 2019-09-19 ENCOUNTER — Other Ambulatory Visit: Payer: Self-pay | Admitting: Family Medicine

## 2019-09-19 DIAGNOSIS — E119 Type 2 diabetes mellitus without complications: Secondary | ICD-10-CM

## 2019-11-03 ENCOUNTER — Other Ambulatory Visit: Payer: Self-pay | Admitting: Family Medicine

## 2019-11-03 DIAGNOSIS — I10 Essential (primary) hypertension: Secondary | ICD-10-CM

## 2019-11-10 ENCOUNTER — Ambulatory Visit (INDEPENDENT_AMBULATORY_CARE_PROVIDER_SITE_OTHER): Payer: Medicare Other

## 2019-11-10 VITALS — Ht 73.0 in | Wt 177.0 lb

## 2019-11-10 DIAGNOSIS — Z0000 Encounter for general adult medical examination without abnormal findings: Secondary | ICD-10-CM | POA: Diagnosis not present

## 2019-11-10 NOTE — Progress Notes (Signed)
I connected with Brien Mates today by telephone and verified that I am speaking with the correct person using two identifiers. Location patient: home Location provider: work Persons participating in the virtual visit: Nassim Cosma, Glenna Durand LPN.   I discussed the limitations, risks, security and privacy concerns of performing an evaluation and management service by telephone and the availability of in person appointments. I also discussed with the patient that there may be a patient responsible charge related to this service. The patient expressed understanding and verbally consented to this telephonic visit.    Interactive audio and video telecommunications were attempted between this provider and patient, however failed, due to patient having technical difficulties OR patient did not have access to video capability.  We continued and completed visit with audio only.     Vital signs may be patient reported or missing.  Subjective:   KETAN RENZ is a 74 y.o. male who presents for Medicare Annual/Subsequent preventive examination.  Review of Systems     Cardiac Risk Factors include: advanced age (>26men, >34 women);diabetes mellitus;dyslipidemia;hypertension;male gender;sedentary lifestyle     Objective:    Today's Vitals   11/10/19 1026  Weight: 177 lb (80.3 kg)  Height: 6\' 1"  (1.854 m)   Body mass index is 23.35 kg/m.  Advanced Directives 11/10/2019 09/23/2018 03/27/2018 09/19/2017 02/07/2017 01/30/2017 12/23/2016  Does Patient Have a Medical Advance Directive? No Yes No No No No No  Type of Advance Directive - Living will;Healthcare Power of Attorney - - - - -  Copy of [place] in Chart? - No - copy requested - - - - -  Would patient like information on creating a medical advance directive? - - No - Patient declined Yes (MAU/Ambulatory/Procedural Areas - Information given) No - Patient declined - -    Current Medications  (verified) Outpatient Encounter Medications as of 11/10/2019  Medication Sig  . atenolol (TENORMIN) 50 MG tablet TAKE ONE TABLET BY MOUTH EVERY DAY  . atorvastatin (LIPITOR) 20 MG tablet TAKE ONE TABLET EVERY DAY  . benazepril (LOTENSIN) 40 MG tablet TAKE 1 TABLET BY MOUTH DAILY  . Cholecalciferol (VITAMIN D-3) 1000 units CAPS Take 1,000 Units by mouth daily.   . hydrochlorothiazide (HYDRODIURIL) 25 MG tablet TAKE ONE TABLET BY MOUTH EVERY DAY  . metFORMIN (GLUCOPHAGE) 500 MG tablet TAKE ONE TABLET BY MOUTH 3 TIMES DAILY  . TURMERIC PO Take 1 capsule by mouth daily.   . vitamin B-12 (CYANOCOBALAMIN) 1000 MCG tablet Take 1,000 mcg by mouth daily.   No facility-administered encounter medications on file as of 11/10/2019.    Allergies (verified) Acetaminophen and Amlodipine   History: Past Medical History:  Diagnosis Date  . Diabetes mellitus without complication ([place])   . ED (erectile dysfunction)   . Hypertension   . Motion sickness    boats  . Neuropathy   . Prostate enlargement   . Urinary retention    Past Surgical History:  Procedure Laterality Date  . BASAL CELL CARCINOMA EXCISION     nose   . CATARACT EXTRACTION Bilateral 1986, 2014  . COLONOSCOPY WITH PROPOFOL N/A 03/27/2018   Procedure: COLONOSCOPY WITH BIOPSIES;  Surgeon: Lin Landsman, MD;  Location: [place];  Service: Endoscopy;  Laterality: N/A;  Diabetic - oral meds  . EYE SURGERY Bilateral [id number]  . FRACTURE SURGERY Right    as a child/ right arm  . HERNIA REPAIR Right 01-13-14   inguinal hernia  . 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 POLYPECTOMY N/A 03/27/2018   Procedure: POLYPECTOMY;  Surgeon: Lin Landsman, MD;  Location: [place];  Service: Endoscopy;  Laterality: N/A;  .  RETINAL DETACHMENT SURGERY  Jan 2015  . TONSILLECTOMY  1955   Family History  Problem Relation Age of Onset  . Colon cancer Father   . Angina Father   . Macular degeneration Father   . Alzheimer's disease Mother   . Prostate cancer Neg Hx   . Bladder Cancer Neg Hx   . Kidney cancer Neg Hx    Social History   Socioeconomic History  . Marital status: Married    Spouse name: Not on file  . Number of children: Not on file  . Years of education: Not on file  . Highest education level: Bachelor's degree (e.g., BA, AB, BS)  Occupational History  . Not on file  Tobacco Use  . Smoking status: Former Smoker    Packs/day: 1.00    Years: 13.00    Pack years: 13.00    Types: Cigarettes    Quit date: 02/21/1984    Years since quitting: 35.7  . Smokeless tobacco: Never Used  Vaping Use  . Vaping Use: Never used  Substance and Sexual Activity  . Alcohol use: Yes    Alcohol/week: 2.0 standard drinks    Types: 2 Cans of beer per week    Comment: wine occasionally   . Drug use: No  . Sexual activity: Not on file  Other Topics Concern  . Not on file  Social History Narrative   Working fulltime    Social Determinants of Radio broadcast assistant Strain: [place]   . Difficulty of Paying Living Expenses: Not hard at all  Food Insecurity: No Food Insecurity  . Worried About Charity fundraiser in the Last Year: Never true  . Ran Out of Food in the Last Year: Never true  Transportation Needs: No Transportation Needs  . Lack of Transportation (Medical): No  . Lack of Transportation (Non-Medical): No  Physical Activity: Inactive  . Days of Exercise per Week: 0 days  . Minutes of Exercise per Session: 0 min  Stress: No Stress Concern Present  . Feeling of Stress : Not at all  Social Connections:   . Frequency of Communication with Friends and Family: Not on file  . Frequency of Social Gatherings with Friends and Family: Not on file  . Attends Religious Services: Not on file  .  Active Member of Clubs or Organizations: Not on file  . Attends Archivist Meetings: Not on file  . Marital Status: Not on file    Tobacco Counseling Counseling given: Not Answered   Clinical Intake:  Pre-visit preparation completed: Yes  Pain : No/denies pain     Nutritional Status: BMI of 19-24  Normal Nutritional Risks: None Diabetes: Yes  How often do you need to have someone help you when you read instructions, pamphlets, or other written materials from your doctor or pharmacy?: 1 - Never What is the last grade level you completed in school?: college  Diabetic? Yes Nutrition Risk Assessment:  Has the patient had any N/V/D within the last 2 months?  No  Does the patient have any non-healing wounds?  No  Has the patient had any unintentional weight loss or weight  gain?  No   Diabetes:  Is the patient diabetic?  Yes  If diabetic, was a CBG obtained today?  Yes  Did the patient bring in their glucometer from home?  Yes  How often do you monitor your CBG's? Does not.   Financial Strains and Diabetes Management:  Are you having any financial strains with the device, your supplies or your medication? No .  Does the patient want to be seen by Chronic Care Management for management of their diabetes?  No  Would the patient like to be referred to a Nutritionist or for Diabetic Management?  No   Diabetic Exams:  Diabetic Eye Exam: Completed 03/05/2019 Diabetic Foot Exam: Overdue, Pt has been advised about the importance in completing this exam. Pt is scheduled for diabetic foot exam on next appointment.   Interpreter Needed?: No  Information entered by :: NAllen LPN   Activities of Daily Living In your present state of health, do you have any difficulty performing the following activities: 11/10/2019  Hearing? N  Vision? N  Difficulty concentrating or making decisions? N  Walking or climbing stairs? N  Dressing or bathing? N  Doing errands, shopping? N   Preparing Food and eating ? N  Using the Toilet? N  In the past six months, have you accidently leaked urine? Y  Comment a trace  Do you have problems with loss of bowel control? N  Managing your Medications? N  Managing your Finances? N  Housekeeping or managing your Housekeeping? N  Some recent data might be hidden    Patient Care Team: Venita Lick, NP as PCP - General (Nurse Practitioner) Christene Lye, MD (General Surgery) Schnier, Dolores Lory, MD (Vascular Surgery)  Indicate any recent Medical Services you may have received from other than [facility] providers in the past year (date may be approximate).     Assessment:   This is a routine wellness examination for [place].  Hearing/Vision screen  Hearing Screening   125Hz  250Hz  500Hz  1000Hz  2000Hz  3000Hz  4000Hz  6000Hz  8000Hz   Right ear:           Left ear:           Vision Screening Comments: Regular eye exams, [facility], Dr. Edison Pace  Dietary issues and exercise activities discussed: Current Exercise Habits: The patient does not participate in regular exercise at present  Goals    . DIET - INCREASE WATER INTAKE     Recommend drinking at least 6-8 glasses of water a day     . Increase water intake     Recommend drinking at least 5-6 glasses of water a day     . Patient Stated     11/10/2019, wants to walk more      Depression Screen PHQ 2/9 Scores 11/10/2019 09/23/2018 09/19/2017 07/30/2017 01/26/2017 09/14/2016 07/21/2015  PHQ - 2 Score 0 0 0 0 0 0 0    Fall Risk Fall Risk  11/10/2019 09/23/2018 09/19/2017 07/30/2017 07/30/2017  Falls in the past year? 1 0 Yes No No  Comment slips - - - -  Number falls in past yr: 1 - 1 - -  Injury with Fall? 0 - No - -  Risk for fall due to : Medication side effect;History of fall(s) - - - -  Follow up Falls evaluation completed;Education provided;Falls prevention discussed - - - -    Any stairs in or around the home? Yes  If so, are there any without handrails? No  Home free of loose throw rugs in walkways, pet beds, electrical cords, etc? Yes  Adequate lighting in your home to reduce risk of falls? Yes   ASSISTIVE DEVICES UTILIZED TO PREVENT FALLS:  Life alert? No  Use of a cane, walker or w/c? No  Grab bars in the bathroom? Yes  Shower chair or bench in shower? No  Elevated toilet seat or a handicapped toilet? No   TIMED UP AND GO:  Was the test performed? No   Cognitive Function:     6CIT Screen 11/10/2019 09/19/2017 09/14/2016  What Year? 0 points 0 points 0 points  What month? 0 points 0 points 0 points  What time? 0 points 0 points 0 points  Count back from 20 0 points 0 points 0 points  Months in reverse 0 points 0 points 0 points  Repeat phrase 2 points 0 points 2 points  Total Score 2 0 2    Immunizations Immunization History  Administered Date(s) Administered  . Influenza, High Dose Seasonal PF 11/09/2015, 12/15/2016, 12/13/2017, 11/06/2018  . Influenza,inj,Quad PF,6+ Mos 12/15/2014  . Influenza-Unspecified 12/19/2016  . PFIZER SARS-COV-2 Vaccination 04/01/2019, 04/22/2019  . Pneumococcal Conjugate-13 05/13/2014  . Pneumococcal Polysaccharide-23 07/21/2015  . Pneumococcal-Unspecified 01/09/2008  . Tdap 09/02/2009  . Zoster 01/09/2008    TDAP status: Due, Education has been provided regarding the importance of this vaccine. Advised may receive this vaccine at local pharmacy or Health Dept. Aware to provide a copy of the vaccination record if obtained from local pharmacy or Health Dept. Verbalized acceptance and understanding. Flu Vaccine status: Up to date Pneumococcal vaccine status: Up to date Covid-19 vaccine status: Completed vaccines  Qualifies for Shingles Vaccine? Yes   Zostavax completed Yes   Shingrix Completed?: No.    Education has been provided regarding the importance of this vaccine. Patient has been advised to call insurance company to determine out of pocket expense if they have not yet received this  vaccine. Advised may also receive vaccine at local pharmacy or Health Dept. Verbalized acceptance and understanding.  Screening Tests Health Maintenance  Topic Date Due  . FOOT EXAM  02/21/2018  . TETANUS/TDAP  09/03/2019  . INFLUENZA VACCINE  09/21/2019  . HEMOGLOBIN A1C  02/26/2020  . OPHTHALMOLOGY EXAM  03/04/2020  . COLONOSCOPY  03/28/2023  . COVID-19 Vaccine  Completed  . Hepatitis C Screening  Completed  . PNA vac Low Risk Adult  Completed    Health Maintenance  Health Maintenance Due  Topic Date Due  . FOOT EXAM  02/21/2018  . TETANUS/TDAP  09/03/2019  . INFLUENZA VACCINE  09/21/2019    Colorectal cancer screening: Completed 03/27/2018. Repeat every 5 years  Lung Cancer Screening: (Low Dose CT Chest recommended if Age 89-80 years, 30 pack-year currently smoking OR have quit w/in 15years.) does not qualify.   Lung Cancer Screening Referral: no  Additional Screening:  Hepatitis C Screening: does qualify; Completed 07/21/2015  Vision Screening: Recommended annual ophthalmology exams for early detection of glaucoma and other disorders of the eye. Is the patient up to date with their annual eye exam?  Yes  Who is the provider or what is the name of the office in which the patient attends annual eye exams? [facility] If pt is not established with a provider, would they like to be referred to a provider to establish care? No .   Dental Screening: Recommended annual dental exams for proper oral hygiene  Community Resource Referral / Chronic Care Management: CRR required  this visit?  No   CCM required this visit?  No      Plan:     I have personally reviewed and noted the following in the patient's chart:   . Medical and social history . Use of alcohol, tobacco or illicit drugs  . Current medications and supplements . Functional ability and status . Nutritional status . Physical activity . Advanced directives . List of other  physicians . Hospitalizations, surgeries, and ER visits in previous 12 months . Vitals . Screenings to include cognitive, depression, and falls . Referrals and appointments  In addition, I have reviewed and discussed with patient certain preventive protocols, quality metrics, and best practice recommendations. A written personalized care plan for preventive services as well as general preventive health recommendations were provided to patient.     Kellie Simmering, LPN   [id number]   Nurse Notes:

## 2019-11-10 NOTE — Patient Instructions (Signed)
[name] , Thank you for taking time to come for your Medicare Wellness Visit. I appreciate your ongoing commitment to your health goals. Please review the following plan we discussed and let me know if I can assist you in the future.   Screening recommendations/referrals: Colonoscopy: completed 03/27/2018 Recommended yearly ophthalmology/optometry visit for glaucoma screening and checkup Recommended yearly dental visit for hygiene and checkup  Vaccinations: Influenza vaccine: due Pneumococcal vaccine: completed 07/21/2015 Tdap vaccine: due Shingles vaccine: discussed   Covid-19:  04/22/2019, 04/01/2019  Advanced directives: Advance directive discussed with you today.    Conditions/risks identified: none  Next appointment: Follow up in one year for your annual wellness visit.   Preventive Care 74 Years and Older, Male Preventive care refers to lifestyle choices and visits with your health care provider that can promote health and wellness. What does preventive care include?  A yearly physical exam. This is also called an annual well check.  Dental exams once or twice a year.  Routine eye exams. Ask your health care provider how often you should have your eyes checked.  Personal lifestyle choices, including:  Daily care of your teeth and gums.  Regular physical activity.  Eating a healthy diet.  Avoiding tobacco and drug use.  Limiting alcohol use.  Practicing safe sex.  Taking low doses of aspirin every day.  Taking vitamin and mineral supplements as recommended by your health care provider. What happens during an annual well check? The services and screenings done by your health care provider during your annual well check will depend on your age, overall health, lifestyle risk factors, and family history of disease. Counseling  Your health care provider may ask you questions about your:  Alcohol use.  Tobacco use.  Drug use.  Emotional well-being.  Home  and relationship well-being.  Sexual activity.  Eating habits.  History of falls.  Memory and ability to understand (cognition).  Work and work Statistician. Screening  You may have the following tests or measurements:  Height, weight, and BMI.  Blood pressure.  Lipid and cholesterol levels. These may be checked every 5 years, or more frequently if you are over 68 years old.  Skin check.  Lung cancer screening. You may have this screening every year starting at age 13 if you have a 30-pack-year history of smoking and currently smoke or have quit within the past 15 years.  Fecal occult blood test (FOBT) of the stool. You may have this test every year starting at age 41.  Flexible sigmoidoscopy or colonoscopy. You may have a sigmoidoscopy every 5 years or a colonoscopy every 10 years starting at age 58.  Prostate cancer screening. Recommendations will vary depending on your family history and other risks.  Hepatitis C blood test.  Hepatitis B blood test.  Sexually transmitted disease (STD) testing.  Diabetes screening. This is done by checking your blood sugar (glucose) after you have not eaten for a while (fasting). You may have this done every 1-3 years.  Abdominal aortic aneurysm (AAA) screening. You may need this if you are a current or former smoker.  Osteoporosis. You may be screened starting at age 32 if you are at high risk. Talk with your health care provider about your test results, treatment options, and if necessary, the need for more tests. Vaccines  Your health care provider may recommend certain vaccines, such as:  Influenza vaccine. This is recommended every year.  Tetanus, diphtheria, and acellular pertussis (Tdap, Td) vaccine. You may need a  Td booster every 10 years.  Zoster vaccine. You may need this after age 16.  Pneumococcal 13-valent conjugate (PCV13) vaccine. One dose is recommended after age 31.  Pneumococcal polysaccharide (PPSV23) vaccine.  One dose is recommended after age 2. Talk to your health care provider about which screenings and vaccines you need and how often you need them. This information is not intended to replace advice given to you by your health care provider. Make sure you discuss any questions you have with your health care provider. Document Released: 03/05/2015 Document Revised: 10/27/2015 Document Reviewed: 12/08/2014 Elsevier Interactive Patient Education  2017 [place] Prevention in the Home Falls can cause injuries. They can happen to people of all ages. There are many things you can do to make your home safe and to help prevent falls. What can I do on the outside of my home?  Regularly fix the edges of walkways and driveways and fix any cracks.  Remove anything that might make you trip as you walk through a door, such as a raised step or threshold.  Trim any bushes or trees on the path to your home.  Use bright outdoor lighting.  Clear any walking paths of anything that might make someone trip, such as rocks or tools.  Regularly check to see if handrails are loose or broken. Make sure that both sides of any steps have handrails.  Any raised decks and porches should have guardrails on the edges.  Have any leaves, snow, or ice cleared regularly.  Use sand or salt on walking paths during winter.  Clean up any spills in your garage right away. This includes oil or grease spills. What can I do in the bathroom?  Use night lights.  Install grab bars by the toilet and in the tub and shower. Do not use towel bars as grab bars.  Use non-skid mats or decals in the tub or shower.  If you need to sit down in the shower, use a plastic, non-slip stool.  Keep the floor dry. Clean up any water that spills on the floor as soon as it happens.  Remove soap buildup in the tub or shower regularly.  Attach bath mats securely with double-sided non-slip rug tape.  Do not have throw rugs and other  things on the floor that can make you trip. What can I do in the bedroom?  Use night lights.  Make sure that you have a light by your bed that is easy to reach.  Do not use any sheets or blankets that are too big for your bed. They should not hang down onto the floor.  Have a firm chair that has side arms. You can use this for support while you get dressed.  Do not have throw rugs and other things on the floor that can make you trip. What can I do in the kitchen?  Clean up any spills right away.  Avoid walking on wet floors.  Keep items that you use a lot in easy-to-reach places.  If you need to reach something above you, use a strong step stool that has a grab bar.  Keep electrical cords out of the way.  Do not use floor polish or wax that makes floors slippery. If you must use wax, use non-skid floor wax.  Do not have throw rugs and other things on the floor that can make you trip. What can I do with my stairs?  Do not leave any items on the stairs.  Make sure that there are handrails on both sides of the stairs and use them. Fix handrails that are broken or loose. Make sure that handrails are as long as the stairways.  Check any carpeting to make sure that it is firmly attached to the stairs. Fix any carpet that is loose or worn.  Avoid having throw rugs at the top or bottom of the stairs. If you do have throw rugs, attach them to the floor with carpet tape.  Make sure that you have a light switch at the top of the stairs and the bottom of the stairs. If you do not have them, ask someone to add them for you. What else can I do to help prevent falls?  Wear shoes that:  Do not have high heels.  Have rubber bottoms.  Are comfortable and fit you well.  Are closed at the toe. Do not wear sandals.  If you use a stepladder:  Make sure that it is fully opened. Do not climb a closed stepladder.  Make sure that both sides of the stepladder are locked into place.  Ask  someone to hold it for you, if possible.  Clearly mark and make sure that you can see:  Any grab bars or handrails.  First and last steps.  Where the edge of each step is.  Use tools that help you move around (mobility aids) if they are needed. These include:  Canes.  Walkers.  Scooters.  Crutches.  Turn on the lights when you go into a dark area. Replace any light bulbs as soon as they burn out.  Set up your furniture so you have a clear path. Avoid moving your furniture around.  If any of your floors are uneven, fix them.  If there are any pets around you, be aware of where they are.  Review your medicines with your doctor. Some medicines can make you feel dizzy. This can increase your chance of falling. Ask your doctor what other things that you can do to help prevent falls. This information is not intended to replace advice given to you by your health care provider. Make sure you discuss any questions you have with your health care provider. Document Released: 12/03/2008 Document Revised: 07/15/2015 Document Reviewed: 03/13/2014 Elsevier Interactive Patient Education  2017 [employer].

## 2019-11-23 DIAGNOSIS — Z20822 Contact with and (suspected) exposure to covid-19: Secondary | ICD-10-CM | POA: Diagnosis not present

## 2019-11-28 ENCOUNTER — Ambulatory Visit (INDEPENDENT_AMBULATORY_CARE_PROVIDER_SITE_OTHER): Payer: Medicare Other | Admitting: Nurse Practitioner

## 2019-11-28 ENCOUNTER — Encounter: Payer: Self-pay | Admitting: Nurse Practitioner

## 2019-11-28 ENCOUNTER — Other Ambulatory Visit: Payer: Self-pay

## 2019-11-28 VITALS — BP 111/68 | HR 67 | Temp 98.0°F | Ht 73.0 in | Wt 181.2 lb

## 2019-11-28 DIAGNOSIS — E1169 Type 2 diabetes mellitus with other specified complication: Secondary | ICD-10-CM | POA: Diagnosis not present

## 2019-11-28 DIAGNOSIS — C61 Malignant neoplasm of prostate: Secondary | ICD-10-CM

## 2019-11-28 DIAGNOSIS — E785 Hyperlipidemia, unspecified: Secondary | ICD-10-CM

## 2019-11-28 DIAGNOSIS — E1159 Type 2 diabetes mellitus with other circulatory complications: Secondary | ICD-10-CM

## 2019-11-28 DIAGNOSIS — E538 Deficiency of other specified B group vitamins: Secondary | ICD-10-CM

## 2019-11-28 DIAGNOSIS — E1349 Other specified diabetes mellitus with other diabetic neurological complication: Secondary | ICD-10-CM

## 2019-11-28 DIAGNOSIS — I152 Hypertension secondary to endocrine disorders: Secondary | ICD-10-CM | POA: Diagnosis not present

## 2019-11-28 DIAGNOSIS — Z0000 Encounter for general adult medical examination without abnormal findings: Secondary | ICD-10-CM

## 2019-11-28 DIAGNOSIS — Z23 Encounter for immunization: Secondary | ICD-10-CM

## 2019-11-28 DIAGNOSIS — E1365 Other specified diabetes mellitus with hyperglycemia: Secondary | ICD-10-CM

## 2019-11-28 DIAGNOSIS — IMO0002 Reserved for concepts with insufficient information to code with codable children: Secondary | ICD-10-CM

## 2019-11-28 MED ORDER — HYDROCHLOROTHIAZIDE 25 MG PO TABS: 25.0000 mg | ORAL_TABLET | Freq: Every day | ORAL | 4 refills | Status: DC

## 2019-11-28 MED ORDER — ATORVASTATIN CALCIUM 20 MG PO TABS
20.0000 mg | ORAL_TABLET | Freq: Every day | ORAL | 4 refills | Status: DC
Start: 2019-11-28 — End: 2020-12-02

## 2019-11-28 MED ORDER — ATENOLOL 50 MG PO TABS: 50.0000 mg | ORAL_TABLET | Freq: Every day | ORAL | 4 refills | Status: DC

## 2019-11-28 MED ORDER — METFORMIN HCL 500 MG PO TABS: ORAL_TABLET | ORAL | 4 refills | Status: DC

## 2019-11-28 MED ORDER — BENAZEPRIL HCL 40 MG PO TABS: 40.0000 mg | ORAL_TABLET | Freq: Every day | ORAL | 4 refills | Status: DC

## 2019-11-28 MED ORDER — SHINGRIX 50 MCG/0.5ML IM SUSR: 0.5000 mL | Freq: Once | INTRAMUSCULAR | 0 refills | Status: AC

## 2019-11-28 NOTE — Assessment & Plan Note (Signed)
Check B12 level next visit, history of low levels.

## 2019-11-28 NOTE — Assessment & Plan Note (Signed)
Recent visit with Dr. Erlene Quan and was told is cancer free.  Continue collaboration with urology.

## 2019-11-28 NOTE — Assessment & Plan Note (Signed)
Chronic, ongoing with A1C now at 6.1%.  Continue Metformin TID and diet focus.  Recommend he check BS at least three mornings a week and document.  If elevation above goal A1C next check would consider increasing Metformin to max or adding SGLT.  Return in 3 months.

## 2019-11-28 NOTE — Assessment & Plan Note (Signed)
Chronic, ongoing.  Continue current medication regimen and adjust as needed, recent LDL 70.  Check lipid panel today.

## 2019-11-28 NOTE — Progress Notes (Signed)
BP 111/68 (BP Location: Left Arm, Patient Position: Sitting, Cuff Size: Normal)   Pulse 67   Temp 98 F (36.7 C) (Oral)   Ht 6\' 1"  (1.854 m)   Wt 181 lb 3.2 oz (82.2 kg)   SpO2 98%   BMI 23.91 kg/m    Subjective:    Patient ID: Tyler Morgan, male    DOB: [birth year], 74 y.o.   MRN: [id number]  HPI: BRETTON TANDY is a 74 y.o. male presenting on 11/28/2019 for comprehensive medical examination. Current medical complaints include:none  He currently lives with: wife Interim Problems from his last visit: no   DIABETES Last A1C 6.1% and continues Metformin taking 500 MG TID, + focus on diet changes -- reports he has been decreasing sugar intake and carbs.  Was on Jardiance in past, on for less than a year.  Recent B12 level 684, improved levels. Hypoglycemic episodes:no Polydipsia/polyuria: no Visual disturbance: no Chest pain: no Paresthesias: no Glucose Monitoring: no             Accucheck frequency: Not Checking             Fasting glucose:             Post prandial:             Evening:             Before meals: Taking Insulin?: no             Long acting insulin:             Short acting insulin: Blood Pressure Monitoring: rarely Retinal Examination: Not up to Date Foot Exam: Up to Date Pneumovax: Up to Date Influenza: Up to Date Aspirin: no   HYPERTENSION / HYPERLIPIDEMIA Continues on Benazepril, Atenolol, and HCTZ + Lipitor. Satisfied with current treatment? yes Duration of hypertension: chronic BP monitoring frequency: rarely BP range: <120/80 BP medication side effects: no Duration of hyperlipidemia: chronic Cholesterol medication side effects: no Cholesterol supplements: none Medication compliance: good compliance Aspirin: no Recent stressors: no Recurrent headaches: no Visual changes: no Palpitations: no Dyspnea: no Chest pain: no Lower extremity edema: no Dizzy/lightheaded: no   PROSTATE CANCER: Followed by Dr. Erlene Quan and last saw  07/22/2019, was told at recent visit he was "cancer free".  Continues to have monitoring of PSA, every 6 months.  Functional Status Survey: Is the patient deaf or have difficulty hearing?: No Does the patient have difficulty seeing, even when wearing glasses/contacts?: No Does the patient have difficulty concentrating, remembering, or making decisions?: No Does the patient have difficulty walking or climbing stairs?: No Does the patient have difficulty dressing or bathing?: No Does the patient have difficulty doing errands alone such as visiting a doctor's office or shopping?: No  FALL RISK: Fall Risk  11/10/2019 09/23/2018 09/19/2017 07/30/2017 07/30/2017  Falls in the past year? 1 0 Yes No No  Comment slips - - - -  Number falls in past yr: 1 - 1 - -  Injury with Fall? 0 - No - -  Risk for fall due to : Medication side effect;History of fall(s) - - - -  Follow up Falls evaluation completed;Education provided;Falls prevention discussed - - - -    Depression Screen Depression screen [facility] 2/9 11/10/2019 09/23/2018 09/19/2017 07/30/2017 01/26/2017  Decreased Interest 0 0 0 0 0  Down, Depressed, Hopeless 0 0 0 0 0  PHQ - 2 Score 0 0 0 0 0    Advanced Directives <  no information>  Past Medical History:  Past Medical History:  Diagnosis Date  . Diabetes mellitus without complication ([place])   . ED (erectile dysfunction)   . Hypertension   . Motion sickness    boats  . Neuropathy   . Prostate enlargement   . Urinary retention     Surgical History:  Past Surgical History:  Procedure Laterality Date  . BASAL CELL CARCINOMA EXCISION     nose   . CATARACT EXTRACTION Bilateral 1986, 2014  . COLONOSCOPY WITH PROPOFOL N/A 03/27/2018   Procedure: COLONOSCOPY WITH BIOPSIES;  Surgeon: Lin Landsman, MD;  Location: [place];  Service: Endoscopy;  Laterality: N/A;  Diabetic - oral meds  . EYE SURGERY Bilateral [id number]  . FRACTURE SURGERY Right    as a child/ right arm  . HERNIA  REPAIR Right 01-13-14   inguinal hernia  . 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 POLYPECTOMY N/A 03/27/2018   Procedure: POLYPECTOMY;  Surgeon: Lin Landsman, MD;  Location: [place];  Service: Endoscopy;  Laterality: N/A;  . RETINAL DETACHMENT SURGERY  Jan 2015  . TONSILLECTOMY  1955    Medications:  Current Outpatient Medications on File Prior to Visit  Medication Sig  . Cholecalciferol (VITAMIN D-3) 1000 units CAPS Take 1,000 Units by mouth daily.   . TURMERIC PO Take 1 capsule by mouth daily.   . vitamin B-12 (CYANOCOBALAMIN) 1000 MCG tablet Take 1,000 mcg by mouth daily.   No current facility-administered medications on file prior to visit.    Allergies:  Allergies  Allergen Reactions  . Acetaminophen Swelling and Other (See Comments)    Swelling of liver   . Amlodipine Swelling    Social History:  Social History   Socioeconomic History  . Marital status: Married    Spouse name: Not on file  . Number of children: Not on file  . Years of education: Not on file  . Highest education level: Bachelor's degree (e.g., BA, AB, BS)  Occupational History  . Not on file  Tobacco Use  . Smoking status: Former Smoker    Packs/day: 1.00    Years: 13.00    Pack years: 13.00    Types: Cigarettes    Quit date: 02/21/1984    Years since quitting: 35.7  . Smokeless tobacco: Never Used  Vaping Use  . Vaping Use: Never used  Substance and Sexual Activity  . Alcohol use: Yes    Alcohol/week: 2.0 standard drinks    Types: 2 Cans of beer per week    Comment: wine occasionally   . Drug use: No  . Sexual activity: Not on file  Other Topics Concern  . 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 Difficulty of Paying Living Expenses: Not hard at all  Food Insecurity: No Food Insecurity  . Worried About Charity fundraiser in the Last Year: Never true  . Ran Out of Food in the Last Year: Never true  Transportation Needs: No Transportation Needs  . Lack of Transportation (Medical): No  . Lack of Transportation (Non-Medical): No  Physical Activity: Inactive  . Days of Exercise per Week: 0 days  . Minutes of Exercise per Session:  0 min  Stress: No Stress Concern Present  . Feeling of Stress : Not at all  Social Connections:   . Frequency of Communication with Friends and Family: Not on file  . Frequency of Social Gatherings with Friends and Family: Not on file  . Attends Religious Services: Not on file  . Active Member of Clubs or Organizations: Not on file  . Attends Archivist Meetings: Not on file  . Marital Status: Not on file  Intimate Partner Violence:   . Fear of Current or Ex-Partner: Not on file  . Emotionally Abused: Not on file  . Physically Abused: Not on file  . Sexually Abused: Not on file   Social History   Tobacco Use  Smoking Status Former Smoker  . Packs/day: 1.00  . Years: 13.00  . Pack years: 13.00  . Types: Cigarettes  . Quit date: 02/21/1984  . Years since quitting: 35.7  Smokeless Tobacco Never Used   Social History   Substance and Sexual Activity  Alcohol Use Yes  . Alcohol/week: 2.0 standard drinks  . Types: 2 Cans of beer per week   Comment: wine occasionally     Family History:  Family History  Problem Relation Age of Onset  . Colon cancer Father   . Angina Father   . Macular degeneration Father   . Alzheimer's disease Mother   . Prostate cancer Neg Hx   . Bladder Cancer Neg Hx   . Kidney cancer Neg Hx     Past medical history, surgical history, medications, allergies, family history and social history reviewed with patient today and changes made to appropriate areas of the chart.   Review of  Systems - negative All other ROS negative except what is listed above and in the HPI.      Objective:    BP 111/68 (BP Location: Left Arm, Patient Position: Sitting, Cuff Size: Normal)   Pulse 67   Temp 98 F (36.7 C) (Oral)   Ht 6\' 1"  (1.854 m)   Wt 181 lb 3.2 oz (82.2 kg)   SpO2 98%   BMI 23.91 kg/m   Wt Readings from Last 3 Encounters:  11/28/19 181 lb 3.2 oz (82.2 kg)  11/10/19 177 lb (80.3 kg)  08/26/19 182 lb 12.8 oz (82.9 kg)    Physical Exam Vitals and nursing note reviewed.  Constitutional:      General: He is awake. He is not in acute distress.    Appearance: He is well-developed and well-groomed. He is not ill-appearing.  HENT:     Head: Normocephalic and atraumatic.     Right Ear: Hearing, tympanic membrane, ear canal and external ear normal. No drainage.     Left Ear: Hearing, tympanic membrane, ear canal and external ear normal. No drainage.     Nose: Nose normal.     Mouth/Throat:     Pharynx: Uvula midline.  Eyes:     General: Lids are normal.        Right eye: No discharge.        Left eye: No discharge.     Extraocular Movements: Extraocular movements intact.     Conjunctiva/sclera: Conjunctivae normal.     Pupils: Pupils are equal, round, and reactive to light.     Visual Fields: Right eye visual fields normal and left eye visual fields normal.  Neck:     Thyroid: No thyromegaly.     Vascular: No carotid bruit or JVD.     Trachea: Trachea  normal.  Cardiovascular:     Rate and Rhythm: Normal rate and regular rhythm.     Heart sounds: Normal heart sounds, S1 normal and S2 normal. No murmur heard.  No gallop.   Pulmonary:     Effort: Pulmonary effort is normal. No accessory muscle usage or respiratory distress.     Breath sounds: Normal breath sounds.  Abdominal:     General: Bowel sounds are normal.     Palpations: Abdomen is soft. There is no hepatomegaly or splenomegaly.     Tenderness: There is no abdominal tenderness.  Musculoskeletal:         General: Normal range of motion.     Cervical back: Normal range of motion and neck supple.     Right lower leg: No edema.     Left lower leg: No edema.  Lymphadenopathy:     Head:     Right side of head: No submental, submandibular, tonsillar, preauricular or posterior auricular adenopathy.     Left side of head: No submental, submandibular, tonsillar, preauricular or posterior auricular adenopathy.     Cervical: No cervical adenopathy.  Skin:    General: Skin is warm and dry.     Capillary Refill: Capillary refill takes less than 2 seconds.     Findings: No rash.  Neurological:     Mental Status: He is alert and oriented to person, place, and time.     Cranial Nerves: Cranial nerves are intact.     Gait: Gait is intact.     Deep Tendon Reflexes: Reflexes are normal and symmetric.     Reflex Scores:      Brachioradialis reflexes are 2+ on the right side and 2+ on the left side.      Patellar reflexes are 2+ on the right side and 2+ on the left side. Psychiatric:        Attention and Perception: Attention normal.        Mood and Affect: Mood normal.        Speech: Speech normal.        Behavior: Behavior normal. Behavior is cooperative.        Thought Content: Thought content normal.        Cognition and Memory: Cognition normal.        Judgment: Judgment normal.    Diabetic Foot Exam - Simple   Simple Foot Form Visual Inspection See comments: Yes Sensation Testing See comments: Yes Pulse Check See comments: Yes Comments Bilateral feet with 1+ PT and DP.  Thick, long, yellow toenails with some dry skin bilateral feet.  Sensation right 7/10 and left 7/10.      Results for orders placed or performed in visit on 08/26/19  Bayer DCA Hb A1c Waived  Result Value Ref Range   HB A1C (BAYER DCA - WAIVED) 6.1 <7.0 %  Microalbumin, Urine Waived  Result Value Ref Range   Microalb, Ur Waived 10 0 - 19 mg/L   Creatinine, Urine Waived 100 10 - 300 mg/dL   Microalb/Creat Ratio  <30 <30 mg/g  Basic metabolic panel  Result Value Ref Range   Glucose 102 (H) 65 - 99 mg/dL   BUN 19 8 - 27 mg/dL   Creatinine, Ser 0.70 (L) 0.76 - 1.27 mg/dL   GFR calc non Af Amer 94 >59 mL/min/1.73   GFR calc Af Amer 108 >59 mL/min/1.73   BUN/Creatinine Ratio 27 (H) 10 - 24   Sodium 140 134 - 144 mmol/L  Potassium 4.2 3.5 - 5.2 mmol/L   Chloride 103 96 - 106 mmol/L   CO2 23 20 - 29 mmol/L   Calcium 9.2 8.6 - 10.2 mg/dL  Lipid Panel w/o Chol/HDL Ratio  Result Value Ref Range   Cholesterol, Total 135 100 - 199 mg/dL   Triglycerides 97 0 - 149 mg/dL   HDL 45 >39 mg/dL   VLDL Cholesterol Cal 18 5 - 40 mg/dL   LDL Chol Calc (NIH) 72 0 - 99 mg/dL      Assessment & Plan:   Problem List Items Addressed This Visit      Cardiovascular and Mediastinum   Hypertension associated with diabetes ([place])    Chronic, stable with BP at goal in office.  Urine ALB 10 and A:C 30-300 last visit.  Continue current medication regimen with Benazepril offering kidney protection.  Recommend checking BP a few mornings a week at home and documenting + focus on DASH diet.  CMP and TSH today.  Return in 3 months.      Relevant Medications   metFORMIN (GLUCOPHAGE) 500 MG tablet   hydrochlorothiazide (HYDRODIURIL) 25 MG tablet   atorvastatin (LIPITOR) 20 MG tablet   atenolol (TENORMIN) 50 MG tablet   benazepril (LOTENSIN) 40 MG tablet   Other Relevant Orders   CBC with Differential/Platelet   Comprehensive metabolic panel   TSH   Bayer DCA Hb A1c Waived     Endocrine   DM (diabetes mellitus), secondary, uncontrolled, w/neurologic complic ([facility])    Chronic, ongoing with A1C now at 6.1%.  Continue Metformin TID and diet focus.  Recommend he check BS at least three mornings a week and document.  If elevation above goal A1C next check would consider increasing Metformin to max or adding SGLT.  Return in 3 months.      Relevant Medications   metFORMIN (GLUCOPHAGE) 500 MG tablet   atorvastatin (LIPITOR)  20 MG tablet   benazepril (LOTENSIN) 40 MG tablet   Other Relevant Orders   Bayer DCA Hb A1c Waived   Hyperlipidemia associated with type 2 diabetes mellitus ([facility])    Chronic, ongoing.  Continue current medication regimen and adjust as needed, recent LDL 70.  Check lipid panel today.       Relevant Medications   metFORMIN (GLUCOPHAGE) 500 MG tablet   atorvastatin (LIPITOR) 20 MG tablet   benazepril (LOTENSIN) 40 MG tablet   Other Relevant Orders   Lipid Panel w/o Chol/HDL Ratio   Bayer DCA Hb A1c Waived     Genitourinary   Prostate cancer [facility])    Recent visit with Dr. Erlene Quan and was told is cancer free.  Continue collaboration with urology.        Other   B12 deficiency    Check B12 level next visit, history of low levels.      Relevant Orders   Vitamin B12    Other Visit Diagnoses    Routine general medical examination at a health care facility    -  Primary   Need for influenza vaccination       Relevant Orders   Flu Vaccine QUAD High Dose(Fluad) (Completed)       Discussed aspirin prophylaxis for myocardial infarction prevention and decision was that we recommended ASA, and patient refused  LABORATORY TESTING:  Health maintenance labs ordered today as discussed above.   The natural history of prostate cancer and ongoing controversy regarding screening and potential treatment outcomes of prostate cancer has been discussed with the patient.  The meaning of a false positive PSA and a false negative PSA has been discussed. He indicates understanding of the limitations of this screening test and wishes to proceed with screening PSA testing -- has performed with urology.  IMMUNIZATIONS:   - Tdap: Tetanus vaccination status reviewed: last tetanus booster within 10 years. - Influenza: Up to date - Pneumovax: Up to date - Prevnar: Up to date - Zostavax vaccine: Up to date  SCREENING: - Colonoscopy: Up to date  Discussed with patient purpose of the colonoscopy is to  detect colon cancer at curable precancerous or early stages   - AAA Screening: Not applicable  -Hearing Test: Not applicable  -Spirometry: Not applicable   PATIENT COUNSELING:    Sexuality: Discussed sexually transmitted diseases, partner selection, use of condoms, avoidance of unintended pregnancy  and contraceptive alternatives.   Advised to avoid cigarette smoking.  I discussed with the patient that most people either abstain from alcohol or drink within safe limits (<=14/week and <=4 drinks/occasion for males, <=7/weeks and <= 3 drinks/occasion for females) and that the risk for alcohol disorders and other health effects rises proportionally with the number of drinks per week and how often a drinker exceeds daily limits.  Discussed cessation/primary prevention of drug use and availability of treatment for abuse.   Diet: Encouraged to adjust caloric intake to maintain  or achieve ideal body weight, to reduce intake of dietary saturated fat and total fat, to limit sodium intake by avoiding high sodium foods and not adding table salt, and to maintain adequate dietary potassium and calcium preferably from fresh fruits, vegetables, and low-fat dairy products.    stressed the importance of regular exercise  Injury prevention: Discussed safety belts, safety helmets, smoke detector, smoking near bedding or upholstery.   Dental health: Discussed importance of regular tooth brushing, flossing, and dental visits.   Follow up plan: NEXT PREVENTATIVE PHYSICAL DUE IN 1 YEAR. Return in about 3 months (around 02/28/2020) for T2DM, HTN/HLD, PSA CA, B12 -- needs tetanus.

## 2019-11-28 NOTE — Assessment & Plan Note (Signed)
Chronic, stable with BP at goal in office.  Urine ALB 10 and A:C 30-300 last visit.  Continue current medication regimen with Benazepril offering kidney protection.  Recommend checking BP a few mornings a week at home and documenting + focus on DASH diet.  CMP and TSH today.  Return in 3 months.

## 2019-11-28 NOTE — Patient Instructions (Signed)
Carbohydrate Counting for Diabetes Mellitus, Adult  Carbohydrate counting is a method of keeping track of how many carbohydrates you eat. Eating carbohydrates naturally increases the amount of sugar (glucose) in the blood. Counting how many carbohydrates you eat helps keep your blood glucose within normal limits, which helps you manage your diabetes (diabetes mellitus). It is important to know how many carbohydrates you can safely have in each meal. This is different for every person. A diet and nutrition specialist (registered dietitian) can help you make a meal plan and calculate how many carbohydrates you should have at each meal and snack. Carbohydrates are found in the following foods:  Grains, such as breads and cereals.  Dried beans and soy products.  Starchy vegetables, such as potatoes, peas, and corn.  Fruit and fruit juices.  Milk and yogurt.  Sweets and snack foods, such as cake, cookies, candy, chips, and soft drinks. How do I count carbohydrates? There are two ways to count carbohydrates in food. You can use either of the methods or a combination of both. Reading "Nutrition Facts" on packaged food The "Nutrition Facts" list is included on the labels of almost all packaged foods and beverages in the [place] It includes:  The serving size.  Information about nutrients in each serving, including the grams (g) of carbohydrate per serving. To use the "Nutrition Facts":  Decide how many servings you will have.  Multiply the number of servings by the number of carbohydrates per serving.  The resulting number is the total amount of carbohydrates that you will be having. Learning standard serving sizes of other foods When you eat carbohydrate foods that are not packaged or do not include "Nutrition Facts" on the label, you need to measure the servings in order to count the amount of carbohydrates:  Measure the foods that you will eat with a food scale or measuring cup, if  needed.  Decide how many standard-size servings you will eat.  Multiply the number of servings by 15. Most carbohydrate-rich foods have about 15 g of carbohydrates per serving. ? For example, if you eat 8 oz (170 g) of strawberries, you will have eaten 2 servings and 30 g of carbohydrates (2 servings x 15 g = 30 g).  For foods that have more than one food mixed, such as soups and casseroles, you must count the carbohydrates in each food that is included. The following list contains standard serving sizes of common carbohydrate-rich foods. Each of these servings has about 15 g of carbohydrates:   hamburger bun or  English muffin.   oz (15 mL) syrup.   oz (14 g) jelly.  1 slice of bread.  1 six-inch tortilla.  3 oz (85 g) cooked rice or pasta.  4 oz (113 g) cooked dried beans.  4 oz (113 g) starchy vegetable, such as peas, corn, or potatoes.  4 oz (113 g) hot cereal.  4 oz (113 g) mashed potatoes or  of a large baked potato.  4 oz (113 g) canned or frozen fruit.  4 oz (120 mL) fruit juice.  4-6 crackers.  6 chicken nuggets.  6 oz (170 g) unsweetened dry cereal.  6 oz (170 g) plain fat-free yogurt or yogurt sweetened with artificial sweeteners.  8 oz (240 mL) milk.  8 oz (170 g) fresh fruit or one small piece of fruit.  24 oz (680 g) popped popcorn. Example of carbohydrate counting Sample meal  3 oz (85 g) chicken breast.  6 oz (170 g)   brown rice.  4 oz (113 g) corn.  8 oz (240 mL) milk.  8 oz (170 g) strawberries with sugar-free whipped topping. Carbohydrate calculation 1. Identify the foods that contain carbohydrates: ? Rice. ? Corn. ? Milk. ? Strawberries. 2. Calculate how many servings you have of each food: ? 2 servings rice. ? 1 serving corn. ? 1 serving milk. ? 1 serving strawberries. 3. Multiply each number of servings by 15 g: ? 2 servings rice x 15 g = 30 g. ? 1 serving corn x 15 g = 15 g. ? 1 serving milk x 15 g = 15 g. ? 1  serving strawberries x 15 g = 15 g. 4. Add together all of the amounts to find the total grams of carbohydrates eaten: ? 30 g + 15 g + 15 g + 15 g = 75 g of carbohydrates total. Summary  Carbohydrate counting is a method of keeping track of how many carbohydrates you eat.  Eating carbohydrates naturally increases the amount of sugar (glucose) in the blood.  Counting how many carbohydrates you eat helps keep your blood glucose within normal limits, which helps you manage your diabetes.  A diet and nutrition specialist (registered dietitian) can help you make a meal plan and calculate how many carbohydrates you should have at each meal and snack. This information is not intended to replace advice given to you by your health care provider. Make sure you discuss any questions you have with your health care provider. Document Revised: 08/31/2016 Document Reviewed: 07/21/2015 Elsevier Patient Education  2020 [employer]  

## 2019-11-29 LAB — COMPREHENSIVE METABOLIC PANEL
ALT: 15 IU/L (ref 0–44)
AST: 17 IU/L (ref 0–40)
Albumin/Globulin Ratio: 1.9 (ref 1.2–2.2)
Albumin: 4.2 g/dL (ref 3.7–4.7)
Alkaline Phosphatase: 47 IU/L (ref 44–121)
BUN/Creatinine Ratio: 16 (ref 10–24)
BUN: 15 mg/dL (ref 8–27)
Bilirubin Total: 0.4 mg/dL (ref 0.0–1.2)
CO2: 23 mmol/L (ref 20–29)
Calcium: 9.1 mg/dL (ref 8.6–10.2)
Chloride: 102 mmol/L (ref 96–106)
Creatinine, Ser: 0.92 mg/dL (ref 0.76–1.27)
GFR calc Af Amer: 95 mL/min/{1.73_m2} (ref >59)
GFR calc non Af Amer: 82 mL/min/{1.73_m2} (ref >59)
Globulin, Total: 2.2 g/dL (ref 1.5–4.5)
Glucose: 138 mg/dL — ABNORMAL HIGH (ref 65–99)
Potassium: 3.9 mmol/L (ref 3.5–5.2)
Sodium: 141 mmol/L (ref 134–144)
Total Protein: 6.4 g/dL (ref 6.0–8.5)

## 2019-11-29 LAB — LIPID PANEL W/O CHOL/HDL RATIO
Cholesterol, Total: 148 mg/dL (ref 100–199)
HDL: 45 mg/dL (ref >39)
LDL Chol Calc (NIH): 69 mg/dL (ref 0–99)
Triglycerides: 203 mg/dL — ABNORMAL HIGH (ref 0–149)
VLDL Cholesterol Cal: 34 mg/dL (ref 5–40)

## 2019-11-29 LAB — CBC WITH DIFFERENTIAL/PLATELET
Basophils Absolute: 0 10*3/uL (ref 0.0–0.2)
Basos: 1 %
EOS (ABSOLUTE): 0.4 10*3/uL (ref 0.0–0.4)
Eos: 6 %
Hematocrit: 40.9 % (ref 37.5–51.0)
Hemoglobin: 14 g/dL (ref 13.0–17.7)
Immature Grans (Abs): 0 10*3/uL (ref 0.0–0.1)
Immature Granulocytes: 0 %
Lymphocytes Absolute: 1.4 10*3/uL (ref 0.7–3.1)
Lymphs: 26 %
MCH: 29.5 pg (ref 26.6–33.0)
MCHC: 34.2 g/dL (ref 31.5–35.7)
MCV: 86 fL (ref 79–97)
Monocytes Absolute: 0.4 10*3/uL (ref 0.1–0.9)
Monocytes: 6 %
Neutrophils Absolute: 3.3 10*3/uL (ref 1.4–7.0)
Neutrophils: 61 %
Platelets: 236 10*3/uL (ref 150–450)
RBC: 4.75 x10E6/uL (ref 4.14–5.80)
RDW: 13 % (ref 11.6–15.4)
WBC: 5.5 10*3/uL (ref 3.4–10.8)

## 2019-11-29 LAB — TSH: TSH: 0.995 u[IU]/mL (ref 0.450–4.500)

## 2019-11-29 LAB — BAYER DCA HB A1C WAIVED: HB A1C (BAYER DCA - WAIVED): 6.1 % (ref <7.0)

## 2019-11-29 LAB — VITAMIN B12: Vitamin B-12: 881 pg/mL (ref 232–1245)

## 2019-11-29 NOTE — Progress Notes (Signed)
Contacted via MyChart  Good evening Tyler Morgan, your labs have returned.  Overall everything looks great.  Kidney and liver function are normal.  LDL, bad cholesterol, level is at goal.  Triglyceride level is mildly elevated, recommend continuing statin and diet focus.  You are doing great!! Keep being awesome!!  Thank you for allowing me to participate in your care. Kindest regards, Norina Cowper

## 2020-01-12 ENCOUNTER — Other Ambulatory Visit: Payer: Self-pay | Admitting: Family Medicine

## 2020-01-12 DIAGNOSIS — R972 Elevated prostate specific antigen [PSA]: Secondary | ICD-10-CM

## 2020-01-27 ENCOUNTER — Other Ambulatory Visit: Payer: Medicare Other

## 2020-01-27 ENCOUNTER — Other Ambulatory Visit: Payer: Self-pay

## 2020-01-27 DIAGNOSIS — R972 Elevated prostate specific antigen [PSA]: Secondary | ICD-10-CM

## 2020-01-28 LAB — PSA: Prostate Specific Ag, Serum: 2.6 ng/mL (ref 0.0–4.0)

## 2020-02-03 ENCOUNTER — Encounter: Payer: Self-pay | Admitting: Urology

## 2020-02-03 ENCOUNTER — Ambulatory Visit: Payer: Medicare Other | Admitting: Urology

## 2020-02-03 ENCOUNTER — Other Ambulatory Visit: Payer: Self-pay

## 2020-02-03 VITALS — BP 119/74 | HR 69

## 2020-02-03 DIAGNOSIS — R350 Frequency of micturition: Secondary | ICD-10-CM | POA: Diagnosis not present

## 2020-02-03 DIAGNOSIS — C61 Malignant neoplasm of prostate: Secondary | ICD-10-CM | POA: Diagnosis not present

## 2020-02-03 DIAGNOSIS — N401 Enlarged prostate with lower urinary tract symptoms: Secondary | ICD-10-CM | POA: Diagnosis not present

## 2020-02-03 NOTE — Progress Notes (Signed)
02/03/2020 3:48 PM   Tyler Morgan Jan 08, 1946 [id number]  Referring provider: Guadalupe Maple, MD No address on file  Chief Complaint  Patient presents with  . Prostate Cancer    HPI: Tyler Morgan is a [age] y.o. M who returns today 6 months follow-up of prostate cancer.  He hasa personal history of massive BPH S/plaser nucleation of the prostate 01/2017 with incidental low risk prostate cancer returns today for surveillance.A total of 114 g were resected. 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  He does have a personal history of elevated PSA. He is undergone biopsy in the past which was negative (~10+years ago with Dr. Bernardo Heater, records not available). PreopPSA 8.5 on 10/11/16.  His initial postoperative PSA was 0.9 08/2017 on finasteride. He has since stopped this medication and his PSA continues to rise, most recently up to 4.8 as of 10/23/2018.  As further evaluation for this, he underwent prostate MRI on 12/05/2018. This revealed a 2.2 cm peripheral zone nodule at the left lateral and posterior mid apex which was read as of PI-RADS 5 lesion. No evidence of extracapsular extension or neurovascular involvement. No lymphadenopathy. Estimated residual prostatic volume 74 cc.  He underwent prostate biopsy which was a "cognitive fusion" biopsy which did in fact demonstrate some prostatic asymmetry, left greater than right. Several additional samples of the left area of interest were biopsied but combined with the usual 12 core biopsy. No complications.  Prostate biopsy results are all benign. There is evidence of both chronic and acute inflammation. No malignancy was identified.  Most recent PSA 2.9 as of 07/07/19.   This is remained stable today, currently 2.6 as of 01/27/2020.  Overall, he is doing excellent. He has no urinary  complaints today as per previous. He takes no BPH meds at this time.  He is now ambulating with a cane.  He recently took a trip to [place] with his family and had a wonderful time.  He also mentions today that he has been waking up with spontaneous erections which has not happened in quite some time.  He is volitionally worked on weight loss and blood sugar control.   PMH: Past Medical History:  Diagnosis Date  . Diabetes mellitus without complication ([place])   . ED (erectile dysfunction)   . Hypertension   . Motion sickness    boats  . Neuropathy   . Prostate enlargement   . Urinary retention     Surgical History: Past Surgical History:  Procedure Laterality Date  . BASAL CELL CARCINOMA EXCISION     nose   . CATARACT EXTRACTION Bilateral 1986, 2014  . COLONOSCOPY WITH PROPOFOL N/A 03/27/2018   Procedure: COLONOSCOPY WITH BIOPSIES;  Surgeon: Lin Landsman, MD;  Location: [facility];  Service: Endoscopy;  Laterality: N/A;  Diabetic - oral meds  . EYE SURGERY Bilateral [id number]  . FRACTURE SURGERY Right    as a child/ right arm  . HERNIA REPAIR Right 01-13-14   inguinal hernia  . 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 POLYPECTOMY N/A 03/27/2018   Procedure: POLYPECTOMY;  Surgeon: Lin Landsman, MD;  Location: [facility];  Service: Endoscopy;  Laterality: N/A;  . RETINAL DETACHMENT SURGERY  Jan 2015  .  TONSILLECTOMY  1955    Home Medications:  Allergies as of 02/03/2020      Reactions   Acetaminophen Swelling, Other (See Comments)   Swelling of liver    Amlodipine Swelling      Medication List       Accurate as of February 03, 2020  3:48 PM. If you have any questions, ask your nurse or doctor.        atenolol 50 MG tablet Commonly known as:  TENORMIN Take 1 tablet (50 mg total) by mouth daily.   atorvastatin 20 MG tablet Commonly known as: LIPITOR Take 1 tablet (20 mg total) by mouth daily.   benazepril 40 MG tablet Commonly known as: LOTENSIN Take 1 tablet (40 mg total) by mouth daily.   hydrochlorothiazide 25 MG tablet Commonly known as: HYDRODIURIL Take 1 tablet (25 mg total) by mouth daily.   metFORMIN 500 MG tablet Commonly known as: GLUCOPHAGE TAKE ONE TABLET BY MOUTH 3 TIMES DAILY   TURMERIC PO Take 1 capsule by mouth daily.   vitamin B-12 1000 MCG tablet Commonly known as: CYANOCOBALAMIN Take 1,000 mcg by mouth daily.   Vitamin D-3 25 MCG (1000 UT) Caps Take 1,000 Units by mouth daily.       Allergies:  Allergies  Allergen Reactions  . Acetaminophen Swelling and Other (See Comments)    Swelling of liver   . Amlodipine Swelling    Family History: Family History  Problem Relation Age of Onset  . Colon cancer Father   . Angina Father   . Macular degeneration Father   . Alzheimer's disease Mother   . Prostate cancer Neg Hx   . Bladder Cancer Neg Hx   . Kidney cancer Neg Hx     Social History:  reports that he quit smoking about 35 years ago. His smoking use included cigarettes. He has a 13.00 pack-year smoking history. He has never used smokeless tobacco. He reports current alcohol use of about 2.0 standard drinks of alcohol per week. He reports that he does not use drugs.   Physical Exam: BP 119/74   Pulse 69   Constitutional:  Alert and oriented, No acute distress. HEENT: [place] AT, moist mucus membranes.  Trachea midline, no masses. Cardiovascular: No clubbing, cyanosis, or edema. Respiratory: Normal respiratory effort, no increased work of breathing. Rectal: Normal sphincter tone.  Enlarged 50+ cc prostate, rubbery nodules bilaterally but no firm nodule or induration.  Benign exam. Skin: No rashes, bruises or suspicious lesions. Neurologic: Grossly intact, no focal deficits, moving all  4 extremities. Psychiatric: Normal mood and affect.  Laboratory Data: Lab Results  Component Value Date   WBC 5.5 11/28/2019   HGB 14.0 11/28/2019   HCT 40.9 11/28/2019   MCV 86 11/28/2019   PLT 236 11/28/2019    Lab Results  Component Value Date   CREATININE 0.92 11/28/2019   Lab Results  Component Value Date   HGBA1C 6.1 11/28/2019     Assessment & Plan:    1. Prostate cancer [facility]) Incidental low risk prostate cancer Most recent repeat biopsy 6 months ago unremarkable, reassuring PSA has trended back down to his baseline, 2.6 which is also reassuring We will continue to follow him with serial labs, repeat PSA lab only in 6 months and visit with PSA DRE in 1 year He is agreeable to this plan  2. Benign prostatic hyperplasia without urinary symptoms Asymptomatic on no meds   Hollice Espy, MD  [facility] [address]  [place], [place] [address] [phone]  I spent 30 total minutes on the day of the encounter including pre-visit review of the medical record, face-to-face time with the patient, and post visit ordering of labs/imaging/tests.

## 2020-03-03 ENCOUNTER — Ambulatory Visit: Payer: Medicare Other | Admitting: Nurse Practitioner

## 2020-03-04 ENCOUNTER — Encounter: Payer: Self-pay | Admitting: Nurse Practitioner

## 2020-03-04 ENCOUNTER — Other Ambulatory Visit: Payer: Self-pay

## 2020-03-04 ENCOUNTER — Ambulatory Visit (INDEPENDENT_AMBULATORY_CARE_PROVIDER_SITE_OTHER): Payer: Medicare Other | Admitting: Nurse Practitioner

## 2020-03-04 VITALS — BP 117/75 | HR 61 | Temp 97.4°F | Ht 72.0 in | Wt 182.0 lb

## 2020-03-04 DIAGNOSIS — E1349 Other specified diabetes mellitus with other diabetic neurological complication: Secondary | ICD-10-CM | POA: Diagnosis not present

## 2020-03-04 DIAGNOSIS — E785 Hyperlipidemia, unspecified: Secondary | ICD-10-CM | POA: Diagnosis not present

## 2020-03-04 DIAGNOSIS — Z8546 Personal history of malignant neoplasm of prostate: Secondary | ICD-10-CM

## 2020-03-04 DIAGNOSIS — IMO0002 Reserved for concepts with insufficient information to code with codable children: Secondary | ICD-10-CM

## 2020-03-04 DIAGNOSIS — E1159 Type 2 diabetes mellitus with other circulatory complications: Secondary | ICD-10-CM

## 2020-03-04 DIAGNOSIS — E1365 Other specified diabetes mellitus with hyperglycemia: Secondary | ICD-10-CM | POA: Diagnosis not present

## 2020-03-04 DIAGNOSIS — E1169 Type 2 diabetes mellitus with other specified complication: Secondary | ICD-10-CM

## 2020-03-04 DIAGNOSIS — I152 Hypertension secondary to endocrine disorders: Secondary | ICD-10-CM

## 2020-03-04 LAB — MICROALBUMIN, URINE WAIVED
Creatinine, Urine Waived: 100 mg/dL (ref 10–300)
Microalb, Ur Waived: 10 mg/L (ref 0–19)
Microalb/Creat Ratio: <30 mg/g (ref <30)

## 2020-03-04 LAB — BAYER DCA HB A1C WAIVED: HB A1C (BAYER DCA - WAIVED): 6.4 % (ref <7.0)

## 2020-03-04 NOTE — Assessment & Plan Note (Signed)
Chronic, ongoing.  Continue current medication regimen and adjust as needed, recent LDL 70.  Check lipid panel today.

## 2020-03-04 NOTE — Assessment & Plan Note (Signed)
Recent visit with Dr. Erlene Quan and was told is cancer free.  Continue collaboration with urology and PSA Q6MOS.

## 2020-03-04 NOTE — Progress Notes (Signed)
BP 117/75   Pulse 61   Temp (!) 97.4 F (36.3 C) (Oral)   Ht 6' (1.829 m)   Wt 182 lb (82.6 kg)   SpO2 96%   BMI 24.68 kg/m    Subjective:    Patient ID: Tyler Morgan, male    DOB: [birth year], 75 y.o.   MRN: [id number]  HPI: Tyler Morgan is a 75 y.o. male  Chief Complaint  Patient presents with  . Diabetes   DIABETES Last A1C 6.1%.  Continues Metformin 500 MG TID, + focus on diet changes -- reports he has been decreasing sugar intake and carbs.  Was on Jardiance in past, on for less than a year.  Recent B12 level 881. Did have some diet changes over holidays due to travel. Hypoglycemic episodes:no Polydipsia/polyuria: no Visual disturbance: no Chest pain: no Paresthesias: no Glucose Monitoring: no  Accucheck frequency: Not Checking  Fasting glucose:  Post prandial:  Evening:  Before meals: Taking Insulin?: no  Long acting insulin:  Short acting insulin: Blood Pressure Monitoring: rarely Retinal Examination: Not up to Date Foot Exam: Up to Date Pneumovax: Up to Date Influenza: Up to Date Aspirin: no   HYPERTENSION / HYPERLIPIDEMIA Continues on Benazepril, Atenolol, and HCTZ + Lipitor. Satisfied with current treatment? yes Duration of hypertension: chronic BP monitoring frequency: rarely BP range: <120/80 BP medication side effects: no Duration of hyperlipidemia: chronic Cholesterol medication side effects: no Cholesterol supplements: none Medication compliance: good compliance Aspirin: no Recent stressors: no Recurrent headaches: no Visual changes: no Palpitations: no Dyspnea: no Chest pain: no Lower extremity edema: no Dizzy/lightheaded: no   PROSTATE CANCER: Followed by Dr. Erlene Quan and last saw 02/03/2020, was told at recent visit he was "cancer free".  Continues to have monitoring of PSA, every 6 months, recent December 2021 -- 2.6.  Relevant past medical, surgical, family and social history reviewed and updated as indicated.  Interim medical history since our last visit reviewed. Allergies and medications reviewed and updated.  Review of Systems  Constitutional: Negative for activity change, diaphoresis, fatigue and fever.  Respiratory: Negative for cough, chest tightness, shortness of breath and wheezing.   Cardiovascular: Negative for chest pain, palpitations and leg swelling.  Gastrointestinal: Negative.   Endocrine: Negative for cold intolerance, heat intolerance, polydipsia, polyphagia and polyuria.  Neurological: Negative.   Psychiatric/Behavioral: Negative.     Per HPI unless specifically indicated above     Objective:    BP 117/75   Pulse 61   Temp (!) 97.4 F (36.3 C) (Oral)   Ht 6' (1.829 m)   Wt 182 lb (82.6 kg)   SpO2 96%   BMI 24.68 kg/m   Wt Readings from Last 3 Encounters:  03/04/20 182 lb (82.6 kg)  11/28/19 181 lb 3.2 oz (82.2 kg)  11/10/19 177 lb (80.3 kg)    Physical Exam Vitals and nursing note reviewed.  Constitutional:      General: He is awake. He is not in acute distress.    Appearance: He is well-developed. He is not ill-appearing.  HENT:     Head: Normocephalic and atraumatic.     Right Ear: Hearing normal. No drainage.     Left Ear: Hearing normal. No drainage.  Eyes:     General: Lids are normal.        Right eye: No discharge.        Left eye: No discharge.     Conjunctiva/sclera: Conjunctivae normal.     Pupils: Pupils are equal,  round, and reactive to light.  Neck:     Thyroid: No thyromegaly.     Vascular: No carotid bruit.  Cardiovascular:     Rate and Rhythm: Normal rate and regular rhythm.     Heart sounds: Normal heart sounds, S1 normal and S2 normal. No murmur heard. No gallop.   Pulmonary:     Effort: Pulmonary effort is normal. No accessory muscle usage or respiratory distress.     Breath sounds: Normal breath sounds.  Abdominal:     General: Bowel sounds are normal.     Palpations: Abdomen is soft.  Musculoskeletal:        General: Normal  range of motion.     Cervical back: Normal range of motion and neck supple.     Right lower leg: No edema.     Left lower leg: No edema.  Skin:    General: Skin is warm and dry.  Neurological:     Mental Status: He is alert and oriented to person, place, and time.     Deep Tendon Reflexes: Reflexes are normal and symmetric.  Psychiatric:        Attention and Perception: Attention normal.        Mood and Affect: Mood normal.        Speech: Speech normal.        Behavior: Behavior normal. Behavior is cooperative.        Thought Content: Thought content normal.     Results for orders placed or performed in visit on 01/27/20  PSA  Result Value Ref Range   Prostate Specific Ag, Serum 2.6 0.0 - 4.0 ng/mL      Assessment & Plan:   Problem List Items Addressed This Visit      Cardiovascular and Mediastinum   Hypertension associated with diabetes ([place])    Chronic, stable with BP at goal in office.  Urine ALB 10 and A:C <30 today.  Continue current medication regimen with Benazepril offering kidney protection.  Recommend checking BP a few mornings a week at home and documenting + focus on DASH diet.  BMP today.  Return in 6 months.      Relevant Orders   Bayer DCA Hb A1c Waived   Basic metabolic panel   Microalbumin, Urine Waived     Endocrine   DM (diabetes mellitus), secondary, uncontrolled, w/neurologic complic (HCC) - Primary    Chronic, ongoing with A1C 6.4%, urine ALB 10 and A:C <30.  Continue Metformin TID and diet focus.  Recommend he check BS at least three mornings a week and document.  If elevation above goal A1C next check would consider increasing Metformin to max or adding SGLT.  Return in 6 months.      Relevant Orders   Bayer DCA Hb A1c Waived   Basic metabolic panel   Hyperlipidemia associated with type 2 diabetes mellitus (HCC)    Chronic, ongoing.  Continue current medication regimen and adjust as needed, recent LDL 70.  Check lipid panel today.        Relevant Orders   Bayer DCA Hb A1c Waived   Lipid Panel w/o Chol/HDL Ratio     Other   History of prostate cancer    Recent visit with Dr. Erlene Quan and was told is cancer free.  Continue collaboration with urology and PSA Q6MOS.          Follow up plan: Return in about 6 months (around 09/01/2020) for T2DM, HTN/HLD.

## 2020-03-04 NOTE — Patient Instructions (Signed)
Diabetes Mellitus and Nutrition, Adult When you have diabetes, or diabetes mellitus, it is very important to have healthy eating habits because your blood sugar (glucose) levels are greatly affected by what you eat and drink. Eating healthy foods in the right amounts, at about the same times every day, can help you:  Control your blood glucose.  Lower your risk of heart disease.  Improve your blood pressure.  Reach or maintain a healthy weight. What can affect my meal plan? Every person with diabetes is different, and each person has different needs for a meal plan. Your health care provider may recommend that you work with a dietitian to make a meal plan that is best for you. Your meal plan may vary depending on factors such as:  The calories you need.  The medicines you take.  Your weight.  Your blood glucose, blood pressure, and cholesterol levels.  Your activity level.  Other health conditions you have, such as heart or kidney disease. How do carbohydrates affect me? Carbohydrates, also called carbs, affect your blood glucose level more than any other type of food. Eating carbs naturally raises the amount of glucose in your blood. Carb counting is a method for keeping track of how many carbs you eat. Counting carbs is important to keep your blood glucose at a healthy level, especially if you use insulin or take certain oral diabetes medicines. It is important to know how many carbs you can safely have in each meal. This is different for every person. Your dietitian can help you calculate how many carbs you should have at each meal and for each snack. How does alcohol affect me? Alcohol can cause a sudden decrease in blood glucose (hypoglycemia), especially if you use insulin or take certain oral diabetes medicines. Hypoglycemia can be a life-threatening condition. Symptoms of hypoglycemia, such as sleepiness, dizziness, and confusion, are similar to symptoms of having too much  alcohol.  Do not drink alcohol if: ? Your health care provider tells you not to drink. ? You are pregnant, may be pregnant, or are planning to become pregnant.  If you drink alcohol: ? Do not drink on an empty stomach. ? Limit how much you use to:  0-1 drink a day for women.  0-2 drinks a day for men. ? Be aware of how much alcohol is in your drink. In the [place] one drink equals one 12 oz bottle of beer (355 mL), one 5 oz glass of wine (148 mL), or one 1 oz glass of hard liquor (44 mL). ? Keep yourself hydrated with water, diet soda, or unsweetened iced tea.  Keep in mind that regular soda, juice, and other mixers may contain a lot of sugar and must be counted as carbs. What are tips for following this plan? Reading food labels  Start by checking the serving size on the "Nutrition Facts" label of packaged foods and drinks. The amount of calories, carbs, fats, and other nutrients listed on the label is based on one serving of the item. Many items contain more than one serving per package.  Check the total grams (g) of carbs in one serving. You can calculate the number of servings of carbs in one serving by dividing the total carbs by 15. For example, if a food has 30 g of total carbs per serving, it would be equal to 2 servings of carbs.  Check the number of grams (g) of saturated fats and trans fats in one serving. Choose foods that have   a low amount or none of these fats.  Check the number of milligrams (mg) of salt (sodium) in one serving. Most people should limit total sodium intake to less than 2,300 mg per day.  Always check the nutrition information of foods labeled as "low-fat" or "nonfat." These foods may be higher in added sugar or refined carbs and should be avoided.  Talk to your dietitian to identify your daily goals for nutrients listed on the label. Shopping  Avoid buying canned, pre-made, or processed foods. These foods tend to be high in fat, sodium, and added  sugar.  Shop around the outside edge of the grocery store. This is where you will most often find fresh fruits and vegetables, bulk grains, fresh meats, and fresh dairy. Cooking  Use low-heat cooking methods, such as baking, instead of high-heat cooking methods like deep frying.  Cook using healthy oils, such as olive, canola, or sunflower oil.  Avoid cooking with butter, cream, or high-fat meats. Meal planning  Eat meals and snacks regularly, preferably at the same times every day. Avoid going long periods of time without eating.  Eat foods that are high in fiber, such as fresh fruits, vegetables, beans, and whole grains. Talk with your dietitian about how many servings of carbs you can eat at each meal.  Eat 4-6 oz (112-168 g) of lean protein each day, such as lean meat, chicken, fish, eggs, or tofu. One ounce (oz) of lean protein is equal to: ? 1 oz (28 g) of meat, chicken, or fish. ? 1 egg. ?  cup (62 g) of tofu.  Eat some foods each day that contain healthy fats, such as avocado, nuts, seeds, and fish.   What foods should I eat? Fruits Berries. Apples. Oranges. Peaches. Apricots. Plums. Grapes. Mango. Papaya. Pomegranate. Kiwi. Cherries. Vegetables Lettuce. Spinach. Leafy greens, including kale, chard, collard greens, and mustard greens. Beets. Cauliflower. Cabbage. Broccoli. Carrots. Green beans. Tomatoes. Peppers. Onions. Cucumbers. Brussels sprouts. Grains Whole grains, such as whole-wheat or whole-grain bread, crackers, tortillas, cereal, and pasta. Unsweetened oatmeal. Quinoa. Brown or wild rice. Meats and other proteins Seafood. Poultry without skin. Lean cuts of poultry and beef. Tofu. Nuts. Seeds. Dairy Low-fat or fat-free dairy products such as milk, yogurt, and cheese. The items listed above may not be a complete list of foods and beverages you can eat. Contact a dietitian for more information. What foods should I avoid? Fruits Fruits canned with  syrup. Vegetables Canned vegetables. Frozen vegetables with butter or cream sauce. Grains Refined white flour and flour products such as bread, pasta, snack foods, and cereals. Avoid all processed foods. Meats and other proteins Fatty cuts of meat. Poultry with skin. Breaded or fried meats. Processed meat. Avoid saturated fats. Dairy Full-fat yogurt, cheese, or milk. Beverages Sweetened drinks, such as soda or iced tea. The items listed above may not be a complete list of foods and beverages you should avoid. Contact a dietitian for more information. Questions to ask a health care provider  Do I need to meet with a diabetes educator?  Do I need to meet with a dietitian?  What number can I call if I have questions?  When are the best times to check my blood glucose? Where to find more information:  American Diabetes Association: diabetes.org  Academy of Nutrition and Dietetics: www.eatright.org  National Institute of Diabetes and Digestive and Kidney Diseases: www.niddk.nih.gov  Association of Diabetes Care and Education Specialists: www.diabeteseducator.org Summary  It is important to have healthy eating   habits because your blood sugar (glucose) levels are greatly affected by what you eat and drink.  A healthy meal plan will help you control your blood glucose and maintain a healthy lifestyle.  Your health care provider may recommend that you work with a dietitian to make a meal plan that is best for you.  Keep in mind that carbohydrates (carbs) and alcohol have immediate effects on your blood glucose levels. It is important to count carbs and to use alcohol carefully. This information is not intended to replace advice given to you by your health care provider. Make sure you discuss any questions you have with your health care provider. Document Revised: 01/14/2019 Document Reviewed: 01/14/2019 Elsevier Patient Education  2021 Elsevier Inc.  

## 2020-03-04 NOTE — Assessment & Plan Note (Signed)
Chronic, stable with BP at goal in office.  Urine ALB 10 and A:C <30 today.  Continue current medication regimen with Benazepril offering kidney protection.  Recommend checking BP a few mornings a week at home and documenting + focus on DASH diet.  BMP today.  Return in 6 months.

## 2020-03-04 NOTE — Assessment & Plan Note (Addendum)
Chronic, ongoing with A1C 6.4%, urine ALB 10 and A:C <30.  Continue Metformin TID and diet focus.  Recommend he check BS at least three mornings a week and document.  If elevation above goal A1C next check would consider increasing Metformin to max or adding SGLT.  Return in 6 months.

## 2020-03-05 LAB — LIPID PANEL W/O CHOL/HDL RATIO
Cholesterol, Total: 153 mg/dL (ref 100–199)
HDL: 49 mg/dL (ref >39)
LDL Chol Calc (NIH): 83 mg/dL (ref 0–99)
Triglycerides: 114 mg/dL (ref 0–149)
VLDL Cholesterol Cal: 21 mg/dL (ref 5–40)

## 2020-03-05 LAB — BASIC METABOLIC PANEL
BUN/Creatinine Ratio: 23 (ref 10–24)
BUN: 18 mg/dL (ref 8–27)
CO2: 24 mmol/L (ref 20–29)
Calcium: 9.5 mg/dL (ref 8.6–10.2)
Chloride: 102 mmol/L (ref 96–106)
Creatinine, Ser: 0.78 mg/dL (ref 0.76–1.27)
GFR calc Af Amer: 103 mL/min/{1.73_m2} (ref >59)
GFR calc non Af Amer: 89 mL/min/{1.73_m2} (ref >59)
Glucose: 129 mg/dL — ABNORMAL HIGH (ref 65–99)
Potassium: 4.1 mmol/L (ref 3.5–5.2)
Sodium: 143 mmol/L (ref 134–144)

## 2020-03-05 NOTE — Progress Notes (Signed)
Contacted via [place] morning Joe, your labs have returned.  Kidney function remains stable with normal creatinine and GFR.  Cholesterol levels continue at goal.  If any questions on these let me know.  Keep up the great work!! Keep being awesome!!  Thank you for allowing me to participate in your care. Kindest regards, Salia Cangemi

## 2020-06-08 ENCOUNTER — Other Ambulatory Visit: Payer: Self-pay | Admitting: Family Medicine

## 2020-06-08 DIAGNOSIS — C61 Malignant neoplasm of prostate: Secondary | ICD-10-CM

## 2020-06-08 DIAGNOSIS — R972 Elevated prostate specific antigen [PSA]: Secondary | ICD-10-CM

## 2020-08-03 ENCOUNTER — Other Ambulatory Visit: Payer: Self-pay

## 2020-08-03 ENCOUNTER — Other Ambulatory Visit: Payer: Medicare Other

## 2020-08-03 DIAGNOSIS — R972 Elevated prostate specific antigen [PSA]: Secondary | ICD-10-CM

## 2020-08-03 DIAGNOSIS — C61 Malignant neoplasm of prostate: Secondary | ICD-10-CM

## 2020-08-04 ENCOUNTER — Telehealth: Payer: Self-pay

## 2020-08-04 LAB — PSA: Prostate Specific Ag, Serum: 2.9 ng/mL (ref 0.0–4.0)

## 2020-08-04 NOTE — Telephone Encounter (Signed)
Pt aware of results 

## 2020-08-04 NOTE — Telephone Encounter (Signed)
-----   Message from Hollice Espy, MD sent at 08/04/2020  9:38 AM EDT ----- PSA is stable  Hollice Espy, MD

## 2020-09-01 ENCOUNTER — Other Ambulatory Visit: Payer: Self-pay

## 2020-09-01 ENCOUNTER — Encounter: Payer: Self-pay | Admitting: Nurse Practitioner

## 2020-09-01 ENCOUNTER — Ambulatory Visit (INDEPENDENT_AMBULATORY_CARE_PROVIDER_SITE_OTHER): Payer: Medicare Other | Admitting: Nurse Practitioner

## 2020-09-01 VITALS — BP 98/62 | HR 63 | Temp 97.8°F | Ht 72.0 in | Wt 177.8 lb

## 2020-09-01 DIAGNOSIS — E785 Hyperlipidemia, unspecified: Secondary | ICD-10-CM | POA: Diagnosis not present

## 2020-09-01 DIAGNOSIS — Z23 Encounter for immunization: Secondary | ICD-10-CM | POA: Diagnosis not present

## 2020-09-01 DIAGNOSIS — I152 Hypertension secondary to endocrine disorders: Secondary | ICD-10-CM | POA: Diagnosis not present

## 2020-09-01 DIAGNOSIS — E1365 Other specified diabetes mellitus with hyperglycemia: Secondary | ICD-10-CM

## 2020-09-01 DIAGNOSIS — Z8546 Personal history of malignant neoplasm of prostate: Secondary | ICD-10-CM | POA: Diagnosis not present

## 2020-09-01 DIAGNOSIS — E1349 Other specified diabetes mellitus with other diabetic neurological complication: Secondary | ICD-10-CM | POA: Diagnosis not present

## 2020-09-01 DIAGNOSIS — E1159 Type 2 diabetes mellitus with other circulatory complications: Secondary | ICD-10-CM | POA: Diagnosis not present

## 2020-09-01 DIAGNOSIS — IMO0002 Reserved for concepts with insufficient information to code with codable children: Secondary | ICD-10-CM

## 2020-09-01 DIAGNOSIS — E1169 Type 2 diabetes mellitus with other specified complication: Secondary | ICD-10-CM

## 2020-09-01 LAB — BAYER DCA HB A1C WAIVED: HB A1C (BAYER DCA - WAIVED): 6.2 % (ref <7.0)

## 2020-09-01 MED ORDER — SHINGRIX 50 MCG/0.5ML IM SUSR: 0.5000 mL | Freq: Once | INTRAMUSCULAR | 0 refills | Status: AC

## 2020-09-01 NOTE — Assessment & Plan Note (Addendum)
Chronic, ongoing with A1C 6.2%, urine ALB 10 and A:C <30 in January 2022.  Continue Metformin TID and diet focus.  Recommend he check BS at least three mornings a week and document.  If elevation above goal A1C  would consider increasing Metformin to max or adding SGLT.  Return in 6 months for A1c check.

## 2020-09-01 NOTE — Assessment & Plan Note (Signed)
Recent visit with Dr. Erlene Quan and was told is cancer free.  Continue collaboration with urology and PSA Q6MOS.

## 2020-09-01 NOTE — Assessment & Plan Note (Signed)
Chronic, ongoing.  Continue current medication regimen and adjust as needed, recent LDL 70.  Check lipid panel today.

## 2020-09-01 NOTE — Patient Instructions (Signed)
Hypotension As your heart beats, it forces blood through your body. This force is called blood pressure. If you have hypotension, you have low blood pressure. When your blood pressure is too low, you may not get enough blood to your brain or other parts of your body. This may cause you to feel weak, light-headed, have a fast heartbeat, or even pass out (faint). Low blood pressure may be harmless, or it may cause serious problems. What are the causes? Blood loss. Not enough water in the body (dehydration). Heart problems. Hormone problems. Pregnancy. A very bad infection. Not having enough of certain nutrients. Very bad allergic reactions. Certain medicines. What increases the risk? Age. The risk increases as you get older. Conditions that affect the heart or the brain and spinal cord (central nervous system). Taking certain medicines. Being pregnant. What are the signs or symptoms? Feeling: Weak. Light-headed. Dizzy. Tired (fatigued). Blurred vision. Fast heartbeat. Passing out, in very bad cases. How is this treated? Changing your diet. This may involve eating more salt (sodium) or drinking more water. Taking medicines to raise your blood pressure. Changing how much you take (the dosage) of some of your medicines. Wearing compression stockings. These stockings help to prevent blood clots and reduce swelling in your legs. In some cases, you may need to go to the hospital for: Fluid replacement. This means you will receive fluids through an IV tube. Blood replacement. This means you will receive donated blood through an IV tube (transfusion). Treating an infection or heart problems, if this applies. Monitoring. You may need to be monitored while medicines that you are taking wear off. Follow these instructions at home: Eating and drinking  Drink enough fluids to keep your pee (urine) pale yellow. Eat a healthy diet. Follow instructions from your doctor about what you can eat or  drink. A healthy diet includes: Fresh fruits and vegetables. Whole grains. Low-fat (lean) meats. Low-fat dairy products. Eat extra salt only as told. Do not add extra salt to your diet unless your doctor tells you to. Eat small meals often. Avoid standing up quickly after you eat.  Medicines Take over-the-counter and prescription medicines only as told by your doctor. Follow instructions from your doctor about changing how much you take of your medicines, if this applies. Do not stop or change any of your medicines on your own. General instructions  Wear compression stockings as told by your doctor. Get up slowly from lying down or sitting. Avoid hot showers and a lot of heat as told by your doctor. Return to your normal activities as told by your doctor. Ask what activities are safe for you. Do not use any products that contain nicotine or tobacco, such as cigarettes, e-cigarettes, and chewing tobacco. If you need help quitting, ask your doctor. Keep all follow-up visits as told by your doctor. This is important.  Contact a doctor if: You throw up (vomit). You have watery poop (diarrhea). You have a fever for more than 2-3 days. You feel more thirsty than normal. You feel weak and tired. Get help right away if: You have chest pain. You have a fast or uneven heartbeat. You lose feeling (have numbness) in any part of your body. You cannot move your arms or your legs. You have trouble talking. You get sweaty or feel light-headed. You pass out. You have trouble breathing. You have trouble staying awake. You feel mixed up (confused). Summary Hypotension is also called low blood pressure. It is when the force of   blood pumping through your arteries is too weak. Hypotension may be harmless, or it may cause serious problems. Treatment may include changing your diet and medicines, and wearing compression stockings. In very bad cases, you may need to go to the hospital. This  information is not intended to replace advice given to you by your health care provider. Make sure you discuss any questions you have with your healthcare provider. Document Revised: 08/02/2017 Document Reviewed: 08/02/2017 Elsevier Patient Education  2022 [employer]  

## 2020-09-01 NOTE — Assessment & Plan Note (Signed)
Chronic, stable with BP more hypotensive on check today and lower at home -- with no symptoms.  Urine ALB 10 and A:C <21 March 2020.  Continue current medication regimen, with exception of HCTZ will discontinue this/discussed with him today, Benazepril offering kidney protection.  Recommend checking BP a few mornings a week at home and documenting + focus on DASH diet.  CMP today.  Return in 4 weeks for BP check and continue to reduce medication as needed.

## 2020-09-01 NOTE — Progress Notes (Signed)
BP 98/62 (BP Location: Left Arm)   Pulse 63   Temp 97.8 F (36.6 C) (Oral)   Ht 6' (1.829 m)   Wt 177 lb 12.8 oz (80.6 kg)   SpO2 96%   BMI 24.11 kg/m    Subjective:    Patient ID: Tyler Morgan, male    DOB: Jul 19, 1945, 75 y.o.   MRN: [id number]  HPI: TORREN Morgan is a 75 y.o. male  Chief Complaint  Patient presents with   Diabetes   Hyperlipidemia   Hypertension   Hypotension    Patient states his blood pressure readings have been reading on the lower side. Patient states he has been losing weight and has almost completely cut out sugars and has gotten his carbs down and wonders if that has anything to do with it and would like to discuss with provider.   DIABETES Last A1C 6.4% in January.  Continues Metformin 500 MG TID, + focus on diet changes.  Was on Jardiance in past, on for less than a year.    Had a recent cut to left second finger recently, is healing but still red.  Cut on nail -- not up to date on tetanus. Hypoglycemic episodes:no Polydipsia/polyuria: no Visual disturbance: no Chest pain: no Paresthesias: no Glucose Monitoring: no  Accucheck frequency: Not Checking  Fasting glucose:  Post prandial:  Evening:  Before meals: Taking Insulin?: no  Long acting insulin:  Short acting insulin: Blood Pressure Monitoring: rarely Retinal Examination: Not up to Date -- scheduled for January, but they had to reschedule -- scheduled for December Foot Exam: Up to Date Pneumovax: Up to Date Influenza: Up to Date Aspirin: no   HYPERTENSION / HYPERLIPIDEMIA Continues on Benazepril, Atenolol, and HCTZ + Lipitor.  He has noticed lower BP readings at home -- has been working on weight loss and diet changes + more active.  Does not have any symptoms with this -- no dizziness or light-headiness.  No recent falls. Satisfied with current treatment? yes Duration of hypertension: chronic BP monitoring frequency: rarely BP range: usually runs 100/70 BP  medication side effects: no Duration of hyperlipidemia: chronic Cholesterol medication side effects: no Cholesterol supplements: none Medication compliance: good compliance Aspirin: no Recent stressors: no Recurrent headaches: no Visual changes: no Palpitations: no Dyspnea: no Chest pain: no Lower extremity edema: no Dizzy/lightheaded: no   PROSTATE CANCER: Followed by Dr. Erlene Quan and last saw 06/08/20, was told he was cancer free last year.  Continues to have monitoring of PSA, every 6 months, recent June 2022 was 2.9.  Relevant past medical, surgical, family and social history reviewed and updated as indicated. Interim medical history since our last visit reviewed. Allergies and medications reviewed and updated.  Review of Systems  Constitutional:  Negative for activity change, diaphoresis, fatigue and fever.  Respiratory:  Negative for cough, chest tightness, shortness of breath and wheezing.   Cardiovascular:  Negative for chest pain, palpitations and leg swelling.  Gastrointestinal: Negative.   Endocrine: Negative for cold intolerance, heat intolerance, polydipsia, polyphagia and polyuria.  Neurological: Negative.   Psychiatric/Behavioral: Negative.     Per HPI unless specifically indicated above     Objective:    BP 98/62 (BP Location: Left Arm)   Pulse 63   Temp 97.8 F (36.6 C) (Oral)   Ht 6' (1.829 m)   Wt 177 lb 12.8 oz (80.6 kg)   SpO2 96%   BMI 24.11 kg/m   Wt Readings from Last 3 Encounters:  09/01/20  177 lb 12.8 oz (80.6 kg)  03/04/20 182 lb (82.6 kg)  11/28/19 181 lb 3.2 oz (82.2 kg)    Physical Exam Vitals and nursing note reviewed.  Constitutional:      General: He is awake. He is not in acute distress.    Appearance: He is well-developed. He is not ill-appearing.  HENT:     Head: Normocephalic and atraumatic.     Right Ear: Hearing normal. No drainage.     Left Ear: Hearing normal. No drainage.  Eyes:     General: Lids are normal.         Right eye: No discharge.        Left eye: No discharge.     Conjunctiva/sclera: Conjunctivae normal.     Pupils: Pupils are equal, round, and reactive to light.  Neck:     Thyroid: No thyromegaly.     Vascular: No carotid bruit.  Cardiovascular:     Rate and Rhythm: Normal rate and regular rhythm.     Heart sounds: Normal heart sounds, S1 normal and S2 normal. No murmur heard.   No gallop.  Pulmonary:     Effort: Pulmonary effort is normal. No accessory muscle usage or respiratory distress.     Breath sounds: Normal breath sounds.  Abdominal:     General: Bowel sounds are normal.     Palpations: Abdomen is soft.  Musculoskeletal:        General: Normal range of motion.     Cervical back: Normal range of motion and neck supple.     Right lower leg: No edema.     Left lower leg: No edema.  Skin:    General: Skin is warm and dry.     Findings: Abrasion present.     Comments: Small healing cut to left second finger with no s/s infection.  Neurological:     Mental Status: He is alert and oriented to person, place, and time.     Deep Tendon Reflexes: Reflexes are normal and symmetric.  Psychiatric:        Attention and Perception: Attention normal.        Mood and Affect: Mood normal.        Speech: Speech normal.        Behavior: Behavior normal. Behavior is cooperative.        Thought Content: Thought content normal.    Results for orders placed or performed in visit on 09/01/20  Bayer DCA Hb A1c Waived  Result Value Ref Range   HB A1C (BAYER DCA - WAIVED) 6.2 <7.0 %      Assessment & Plan:   Problem List Items Addressed This Visit       Cardiovascular and Mediastinum   Hypertension associated with diabetes ([place])    Chronic, stable with BP more hypotensive on check today and lower at home -- with no symptoms.  Urine ALB 10 and A:C <21 March 2020.  Continue current medication regimen, with exception of HCTZ will discontinue this/discussed with him today, Benazepril  offering kidney protection.  Recommend checking BP a few mornings a week at home and documenting + focus on DASH diet.  CMP today.  Return in 4 weeks for BP check and continue to reduce medication as needed.       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  Continue Metformin TID and diet focus.  Recommend he check BS at least three mornings a week and document.  If elevation above goal A1C  would consider increasing Metformin to max or adding SGLT.  Return in 6 months for A1c check.       Relevant Orders   Bayer DCA Hb A1c Waived (Completed)   Comprehensive metabolic panel   Hyperlipidemia associated with type 2 diabetes mellitus ([facility])    Chronic, ongoing.  Continue current medication regimen and adjust as needed, recent LDL 70.  Check lipid panel today.        Relevant Orders   Bayer DCA Hb A1c Waived (Completed)   Lipid Panel w/o Chol/HDL Ratio     Other   History of prostate cancer    Recent visit with Dr. Erlene Quan and was told is cancer free.  Continue collaboration with urology and PSA Q6MOS.       Other Visit Diagnoses     Need for Td vaccine       TD vaccine provided today to update.   Relevant Orders   Td vaccine greater than or equal to 7yo preservative free IM (Completed)   Need for shingles vaccine       Shingrix ordered to pharmacy.   Relevant Medications   Zoster Vaccine Adjuvanted [facility]) injection   Zoster Vaccine Adjuvanted [facility]) injection (Start on 03/02/2021)        Follow up plan: Return in about 4 weeks (around 09/29/2020) for Blood pressure check and may change meds more.

## 2020-09-02 LAB — COMPREHENSIVE METABOLIC PANEL
ALT: 14 IU/L (ref 0–44)
AST: 18 IU/L (ref 0–40)
Albumin/Globulin Ratio: 2 (ref 1.2–2.2)
Albumin: 4.5 g/dL (ref 3.7–4.7)
Alkaline Phosphatase: 50 IU/L (ref 44–121)
BUN/Creatinine Ratio: 22 (ref 10–24)
BUN: 18 mg/dL (ref 8–27)
Bilirubin Total: 0.6 mg/dL (ref 0.0–1.2)
CO2: 21 mmol/L (ref 20–29)
Calcium: 9.5 mg/dL (ref 8.6–10.2)
Chloride: 101 mmol/L (ref 96–106)
Creatinine, Ser: 0.82 mg/dL (ref 0.76–1.27)
Globulin, Total: 2.3 g/dL (ref 1.5–4.5)
Glucose: 110 mg/dL — ABNORMAL HIGH (ref 65–99)
Potassium: 4.3 mmol/L (ref 3.5–5.2)
Sodium: 139 mmol/L (ref 134–144)
Total Protein: 6.8 g/dL (ref 6.0–8.5)
eGFR: 92 mL/min/{1.73_m2} (ref >59)

## 2020-09-02 LAB — LIPID PANEL W/O CHOL/HDL RATIO
Cholesterol, Total: 150 mg/dL (ref 100–199)
HDL: 46 mg/dL (ref >39)
LDL Chol Calc (NIH): 83 mg/dL (ref 0–99)
Triglycerides: 115 mg/dL (ref 0–149)
VLDL Cholesterol Cal: 21 mg/dL (ref 5–40)

## 2020-09-02 NOTE — Progress Notes (Signed)
Contacted via MyChart   Good evening Joe, your labs have returned and overall continue to look fabulous with normal kidney and liver function.  Cholesterol levels in good range.  Any questions? Keep being awesome!!  Thank you for allowing me to participate in your care.  I appreciate you. Kindest regards, Armour Villanueva

## 2020-09-29 ENCOUNTER — Other Ambulatory Visit: Payer: Self-pay

## 2020-09-29 ENCOUNTER — Ambulatory Visit (INDEPENDENT_AMBULATORY_CARE_PROVIDER_SITE_OTHER): Payer: Medicare Other | Admitting: Nurse Practitioner

## 2020-09-29 ENCOUNTER — Encounter: Payer: Self-pay | Admitting: Nurse Practitioner

## 2020-09-29 VITALS — BP 127/68 | HR 68 | Temp 98.6°F | Wt 180.2 lb

## 2020-09-29 DIAGNOSIS — I152 Hypertension secondary to endocrine disorders: Secondary | ICD-10-CM | POA: Diagnosis not present

## 2020-09-29 DIAGNOSIS — E1159 Type 2 diabetes mellitus with other circulatory complications: Secondary | ICD-10-CM

## 2020-09-29 MED ORDER — BENAZEPRIL HCL 40 MG PO TABS: 40.0000 mg | ORAL_TABLET | Freq: Every day | ORAL | 4 refills | Status: DC

## 2020-09-29 MED ORDER — ATENOLOL 50 MG PO TABS: 50.0000 mg | ORAL_TABLET | Freq: Every day | ORAL | 4 refills | Status: DC

## 2020-09-29 NOTE — Progress Notes (Signed)
BP 127/68   Pulse 68   Temp 98.6 F (37 C) (Oral)   Wt 180 lb 3.2 oz (81.7 kg)   SpO2 95%   BMI 24.44 kg/m    Subjective:    Patient ID: Tyler Morgan, male    DOB: Mar 18, 1945, 75 y.o.   MRN: [id number]  HPI: Tyler Morgan is a 75 y.o. male  Chief Complaint  Patient presents with   Blood Pressure Recheck     Patient states he stopped his blood pressure medication HTZ and noticed his blood pressure readings were up and down, he noticed weight gain and some swelling in his ankles retaining fluid. Patient states after a few days he noticed the side effects went back to normal such as his weight went back to normal range and swelling went down.    Medication Management   HYPERTENSION BP was on lower side last visit and we opted to discontinue HCTZ, but continue Benazepril for kidney protection with diabetes and Atenolol.  He reports at first did noticed some weight gain and swelling from stopping HCTZ, but this has resolved.   Hypertension status: stable  Satisfied with current treatment? yes Duration of hypertension: chronic BP monitoring frequency:  daily BP range: 120/low to mid 70's BP medication side effects:  no Medication compliance: good compliance Previous BP meds:none Aspirin: no Recurrent headaches: no Visual changes: no Palpitations: no Dyspnea: no Chest pain: no Lower extremity edema: no Dizzy/lightheaded: no   Relevant past medical, surgical, family and social history reviewed and updated as indicated. Interim medical history since our last visit reviewed. Allergies and medications reviewed and updated.  Review of Systems  Constitutional:  Negative for activity change, diaphoresis, fatigue and fever.  Respiratory:  Negative for cough, chest tightness, shortness of breath and wheezing.   Cardiovascular:  Negative for chest pain, palpitations and leg swelling.  Gastrointestinal: Negative.   Endocrine: Negative for cold intolerance, heat  intolerance, polydipsia, polyphagia and polyuria.  Neurological: Negative.   Psychiatric/Behavioral: Negative.     Per HPI unless specifically indicated above     Objective:    BP 127/68   Pulse 68   Temp 98.6 F (37 C) (Oral)   Wt 180 lb 3.2 oz (81.7 kg)   SpO2 95%   BMI 24.44 kg/m   Wt Readings from Last 3 Encounters:  09/29/20 180 lb 3.2 oz (81.7 kg)  09/01/20 177 lb 12.8 oz (80.6 kg)  03/04/20 182 lb (82.6 kg)    Physical Exam Vitals and nursing note reviewed.  Constitutional:      General: He is awake. He is not in acute distress.    Appearance: He is well-developed. He is not ill-appearing.  HENT:     Head: Normocephalic and atraumatic.     Right Ear: Hearing normal. No drainage.     Left Ear: Hearing normal. No drainage.  Eyes:     General: Lids are normal.        Right eye: No discharge.        Left eye: No discharge.     Conjunctiva/sclera: Conjunctivae normal.     Pupils: Pupils are equal, round, and reactive to light.  Neck:     Thyroid: No thyromegaly.     Vascular: No carotid bruit.  Cardiovascular:     Rate and Rhythm: Normal rate and regular rhythm.     Heart sounds: Normal heart sounds, S1 normal and S2 normal. No murmur heard.   No gallop.  Pulmonary:  Effort: Pulmonary effort is normal. No accessory muscle usage or respiratory distress.     Breath sounds: Normal breath sounds.  Abdominal:     General: Bowel sounds are normal.     Palpations: Abdomen is soft.  Musculoskeletal:        General: Normal range of motion.     Cervical back: Normal range of motion and neck supple.     Right lower leg: No edema.     Left lower leg: No edema.  Skin:    General: Skin is warm and dry.  Neurological:     Mental Status: He is alert and oriented to person, place, and time.     Deep Tendon Reflexes: Reflexes are normal and symmetric.  Psychiatric:        Attention and Perception: Attention normal.        Mood and Affect: Mood normal.        Speech:  Speech normal.        Behavior: Behavior normal. Behavior is cooperative.        Thought Content: Thought content normal.    Results for orders placed or performed in visit on 09/01/20  Bayer DCA Hb A1c Waived  Result Value Ref Range   HB A1C (BAYER DCA - WAIVED) 6.2 <7.0 %  Comprehensive metabolic panel  Result Value Ref Range   Glucose 110 (H) 65 - 99 mg/dL   BUN 18 8 - 27 mg/dL   Creatinine, Ser 0.82 0.76 - 1.27 mg/dL   eGFR 92 >59 mL/min/1.73   BUN/Creatinine Ratio 22 10 - 24   Sodium 139 134 - 144 mmol/L   Potassium 4.3 3.5 - 5.2 mmol/L   Chloride 101 96 - 106 mmol/L   CO2 21 20 - 29 mmol/L   Calcium 9.5 8.6 - 10.2 mg/dL   Total Protein 6.8 6.0 - 8.5 g/dL   Albumin 4.5 3.7 - 4.7 g/dL   Globulin, Total 2.3 1.5 - 4.5 g/dL   Albumin/Globulin Ratio 2.0 1.2 - 2.2   Bilirubin Total 0.6 0.0 - 1.2 mg/dL   Alkaline Phosphatase 50 44 - 121 IU/L   AST 18 0 - 40 IU/L   ALT 14 0 - 44 IU/L  Lipid Panel w/o Chol/HDL Ratio  Result Value Ref Range   Cholesterol, Total 150 100 - 199 mg/dL   Triglycerides 115 0 - 149 mg/dL   HDL 46 >39 mg/dL   VLDL Cholesterol Cal 21 5 - 40 mg/dL   LDL Chol Calc (NIH) 83 0 - 99 mg/dL      Assessment & Plan:   Problem List Items Addressed This Visit       Cardiovascular and Mediastinum   Hypertension associated with diabetes ([place]) - Primary    Chronic, stable with BP at goal and no lows noted at home.  Urine ALB 10 and A:C <21 March 2020.  Continue current medication regimen, Benazepril offering kidney protection.  Recommend checking BP a few mornings a week at home and documenting + focus on DASH diet.  Monitor for worsening edema and may provide PRN Lasix if presents.  Recommend avoid Ibuprofen products and high salt intake.  Return in October for physical.       Relevant Medications   benazepril (LOTENSIN) 40 MG tablet   atenolol (TENORMIN) 50 MG tablet     Follow up plan: Return in about 2 months (around 11/29/2020) for Annual physical  with diabetes check.

## 2020-09-29 NOTE — Assessment & Plan Note (Signed)
Chronic, stable with BP at goal and no lows noted at home.  Urine ALB 10 and A:C <21 March 2020.  Continue current medication regimen, Benazepril offering kidney protection.  Recommend checking BP a few mornings a week at home and documenting + focus on DASH diet.  Monitor for worsening edema and may provide PRN Lasix if presents.  Recommend avoid Ibuprofen products and high salt intake.  Return in October for physical.

## 2020-09-29 NOTE — Patient Instructions (Signed)
https://www.nhlbi.nih.gov/files/docs/public/heart/dash_brief.pdf">  DASH Eating Plan DASH stands for Dietary Approaches to Stop Hypertension. The DASH eating plan is a healthy eating plan that has been shown to: Reduce high blood pressure (hypertension). Reduce your risk for type 2 diabetes, heart disease, and stroke. Help with weight loss. What are tips for following this plan? Reading food labels Check food labels for the amount of salt (sodium) per serving. Choose foods with less than 5 percent of the Daily Value of sodium. Generally, foods with less than 300 milligrams (mg) of sodium per serving fit into this eating plan. To find whole grains, look for the word "whole" as the first word in the ingredient list. Shopping Buy products labeled as "low-sodium" or "no salt added." Buy fresh foods. Avoid canned foods and pre-made or frozen meals. Cooking Avoid adding salt when cooking. Use salt-free seasonings or herbs instead of table salt or sea salt. Check with your health care provider or pharmacist before using salt substitutes. Do not fry foods. Cook foods using healthy methods such as baking, boiling, grilling, roasting, and broiling instead. Cook with heart-healthy oils, such as olive, canola, avocado, soybean, or sunflower oil. Meal planning  Eat a balanced diet that includes: 4 or more servings of fruits and 4 or more servings of vegetables each day. Try to fill one-half of your plate with fruits and vegetables. 6-8 servings of whole grains each day. Less than 6 oz (170 g) of lean meat, poultry, or fish each day. A 3-oz (85-g) serving of meat is about the same size as a deck of cards. One egg equals 1 oz (28 g). 2-3 servings of low-fat dairy each day. One serving is 1 cup (237 mL). 1 serving of nuts, seeds, or beans 5 times each week. 2-3 servings of heart-healthy fats. Healthy fats called omega-3 fatty acids are found in foods such as walnuts, flaxseeds, fortified milks, and eggs.  These fats are also found in cold-water fish, such as sardines, salmon, and mackerel. Limit how much you eat of: Canned or prepackaged foods. Food that is high in trans fat, such as some fried foods. Food that is high in saturated fat, such as fatty meat. Desserts and other sweets, sugary drinks, and other foods with added sugar. Full-fat dairy products. Do not salt foods before eating. Do not eat more than 4 egg yolks a week. Try to eat at least 2 vegetarian meals a week. Eat more home-cooked food and less restaurant, buffet, and fast food.  Lifestyle When eating at a restaurant, ask that your food be prepared with less salt or no salt, if possible. If you drink alcohol: Limit how much you use to: 0-1 drink a day for women who are not pregnant. 0-2 drinks a day for men. Be aware of how much alcohol is in your drink. In the [place] one drink equals one 12 oz bottle of beer (355 mL), one 5 oz glass of wine (148 mL), or one 1 oz glass of hard liquor (44 mL). General information Avoid eating more than 2,300 mg of salt a day. If you have hypertension, you may need to reduce your sodium intake to 1,500 mg a day. Work with your health care provider to maintain a healthy body weight or to lose weight. Ask what an ideal weight is for you. Get at least 30 minutes of exercise that causes your heart to beat faster (aerobic exercise) most days of the week. Activities may include walking, swimming, or biking. Work with your health care provider   or dietitian to adjust your eating plan to your individual calorie needs. What foods should I eat? Fruits All fresh, dried, or frozen fruit. Canned fruit in natural juice (without addedsugar). Vegetables Fresh or frozen vegetables (raw, steamed, roasted, or grilled). Low-sodium or reduced-sodium tomato and vegetable juice. Low-sodium or reduced-sodium tomatosauce and tomato paste. Low-sodium or reduced-sodium canned vegetables. Grains Whole-grain or  whole-wheat bread. Whole-grain or whole-wheat pasta. Brown rice. Oatmeal. Quinoa. Bulgur. Whole-grain and low-sodium cereals. Pita bread.Low-fat, low-sodium crackers. Whole-wheat flour tortillas. Meats and other proteins Skinless chicken or turkey. Ground chicken or turkey. Pork with fat trimmed off. Fish and seafood. Egg whites. Dried beans, peas, or lentils. Unsalted nuts, nut butters, and seeds. Unsalted canned beans. Lean cuts of beef with fat trimmed off. Low-sodium, lean precooked or cured meat, such as sausages or meatloaves. Dairy Low-fat (1%) or fat-free (skim) milk. Reduced-fat, low-fat, or fat-free cheeses. Nonfat, low-sodium ricotta or cottage cheese. Low-fat or nonfatyogurt. Low-fat, low-sodium cheese. Fats and oils Soft margarine without trans fats. Vegetable oil. Reduced-fat, low-fat, or light mayonnaise and salad dressings (reduced-sodium). Canola, safflower, olive, avocado, soybean, andsunflower oils. Avocado. Seasonings and condiments Herbs. Spices. Seasoning mixes without salt. Other foods Unsalted popcorn and pretzels. Fat-free sweets. The items listed above may not be a complete list of foods and beverages you can eat. Contact a dietitian for more information. What foods should I avoid? Fruits Canned fruit in a light or heavy syrup. Fried fruit. Fruit in cream or buttersauce. Vegetables Creamed or fried vegetables. Vegetables in a cheese sauce. Regular canned vegetables (not low-sodium or reduced-sodium). Regular canned tomato sauce and paste (not low-sodium or reduced-sodium). Regular tomato and vegetable juice(not low-sodium or reduced-sodium). Pickles. Olives. Grains Baked goods made with fat, such as croissants, muffins, or some breads. Drypasta or rice meal packs. Meats and other proteins Fatty cuts of meat. Ribs. Fried meat. Bacon. Bologna, salami, and other precooked or cured meats, such as sausages or meat loaves. Fat from the back of a pig (fatback). Bratwurst.  Salted nuts and seeds. Canned beans with added salt. Canned orsmoked fish. Whole eggs or egg yolks. Chicken or turkey with skin. Dairy Whole or 2% milk, cream, and half-and-half. Whole or full-fat cream cheese. Whole-fat or sweetened yogurt. Full-fat cheese. Nondairy creamers. Whippedtoppings. Processed cheese and cheese spreads. Fats and oils Butter. Stick margarine. Lard. Shortening. Ghee. Bacon fat. Tropical oils, suchas coconut, palm kernel, or palm oil. Seasonings and condiments Onion salt, garlic salt, seasoned salt, table salt, and sea salt. Worcestershire sauce. Tartar sauce. Barbecue sauce. Teriyaki sauce. Soy sauce, including reduced-sodium. Steak sauce. Canned and packaged gravies. Fish sauce. Oyster sauce. Cocktail sauce. Store-bought horseradish. Ketchup. Mustard. Meat flavorings and tenderizers. Bouillon cubes. Hot sauces. Pre-made or packaged marinades. Pre-made or packaged taco seasonings. Relishes. Regular saladdressings. Other foods Salted popcorn and pretzels. The items listed above may not be a complete list of foods and beverages you should avoid. Contact a dietitian for more information. Where to find more information National Heart, Lung, and Blood Institute: www.nhlbi.nih.gov American Heart Association: www.heart.org Academy of Nutrition and Dietetics: www.eatright.org National Kidney Foundation: www.kidney.org Summary The DASH eating plan is a healthy eating plan that has been shown to reduce high blood pressure (hypertension). It may also reduce your risk for type 2 diabetes, heart disease, and stroke. When on the DASH eating plan, aim to eat more fresh fruits and vegetables, whole grains, lean proteins, low-fat dairy, and heart-healthy fats. With the DASH eating plan, you should limit salt (sodium) intake to 2,300   mg a day. If you have hypertension, you may need to reduce your sodium intake to 1,500 mg a day. Work with your health care provider or dietitian to adjust  your eating plan to your individual calorie needs. This information is not intended to replace advice given to you by your health care provider. Make sure you discuss any questions you have with your healthcare provider. Document Revised: 01/10/2019 Document Reviewed: 01/10/2019 Elsevier Patient Education  2022 Elsevier Inc.  

## 2020-11-01 ENCOUNTER — Ambulatory Visit: Payer: Self-pay | Admitting: *Deleted

## 2020-11-01 ENCOUNTER — Telehealth: Payer: Medicare Other

## 2020-11-01 DIAGNOSIS — Z20822 Contact with and (suspected) exposure to covid-19: Secondary | ICD-10-CM | POA: Diagnosis not present

## 2020-11-01 DIAGNOSIS — Z03818 Encounter for observation for suspected exposure to other biological agents ruled out: Secondary | ICD-10-CM | POA: Diagnosis not present

## 2020-11-01 NOTE — Telephone Encounter (Signed)
Reason for Disposition  [1] HIGH RISK for severe COVID complications (e.g., weak immune system, age > 53 years, obesity with BMI > 25, pregnant, chronic lung disease or other chronic medical condition) AND [2] COVID symptoms (e.g., cough, fever)  (Exceptions: Already seen by PCP and no new or worsening symptoms.)  Answer Assessment - Initial Assessment Questions 1. COVID-19 DIAGNOSIS: "Who made your COVID-19 diagnosis?" "Was it confirmed by a positive lab test or self-test?" If not diagnosed by a doctor (or NP/PA), ask "Are there lots of cases (community spread) where you live?" Note: See public health department website, if unsure.     Today- rapid test and PCR ordered at [facility] 2. COVID-19 EXPOSURE: "Was there any known exposure to COVID before the symptoms began?" CDC Definition of close contact: within 6 feet (2 meters) for a total of 15 minutes or more over a 24-hour period.      Yes- wife tested + COVID 3. ONSET: "When did the COVID-19 symptoms start?"      today 4. WORST SYMPTOM: "What is your worst symptom?" (e.g., cough, fever, shortness of breath, muscle aches)     cough 5. COUGH: "Do you have a cough?" If Yes, ask: "How bad is the cough?"       Yes- slight cough 6. FEVER: "Do you have a fever?" If Yes, ask: "What is your temperature, how was it measured, and when did it start?"     98.9- normal 7. RESPIRATORY STATUS: "Describe your breathing?" (e.g., shortness of breath, wheezing, unable to speak)      Normal breathing 8. BETTER-SAME-WORSE: "Are you getting better, staying the same or getting worse compared to yesterday?"  If getting worse, ask, "In what way?"     Worse- developing symptoms 9. HIGH RISK DISEASE: "Do you have any chronic medical problems?" (e.g., asthma, heart or lung disease, weak immune system, obesity, etc.)     Diabetic, age, hypertension 10. VACCINE: "Have you had the COVID-19 vaccine?" If Yes, ask: "Which one, how many shots, when did you get it?"        Yes- pfizer 11. BOOSTER: "Have you received your COVID-19 booster?" If Yes, ask: "Which one and when did you get it?"       Yes- 8/22 12. PREGNANCY: "Is there any chance you are pregnant?" "When was your last menstrual period?"       N/a 13. OTHER SYMPTOMS: "Do you have any other symptoms?"  (e.g., chills, fatigue, headache, loss of smell or taste, muscle pain, sore throat)       No other symptoms at this time 14. O2 SATURATION MONITOR:  "Do you use an oxygen saturation monitor (pulse oximeter) at home?" If Yes, ask "What is your reading (oxygen level) today?" "What is your usual oxygen saturation reading?" (e.g., 95%)       no  Protocols used: Coronavirus (COVID-19) Diagnosed or Suspected-A-AH

## 2020-11-01 NOTE — Telephone Encounter (Signed)
Patient and wife calling - wife has tested + COVID and patient started having symptoms today- tested + COVID also. Advised per COVID treatment/isolation protocol- note sent for review of treatment- moderate risk patient.

## 2020-11-02 ENCOUNTER — Telehealth (INDEPENDENT_AMBULATORY_CARE_PROVIDER_SITE_OTHER): Payer: Medicare Other | Admitting: Nurse Practitioner

## 2020-11-02 ENCOUNTER — Telehealth: Payer: Medicare Other | Admitting: Internal Medicine

## 2020-11-02 ENCOUNTER — Encounter: Payer: Self-pay | Admitting: Nurse Practitioner

## 2020-11-02 DIAGNOSIS — U071 COVID-19: Secondary | ICD-10-CM | POA: Diagnosis not present

## 2020-11-02 DIAGNOSIS — Z8616 Personal history of COVID-19: Secondary | ICD-10-CM | POA: Insufficient documentation

## 2020-11-02 MED ORDER — MOLNUPIRAVIR EUA 200MG CAPSULE: 4.0000 | ORAL_CAPSULE | Freq: Two times a day (BID) | ORAL | 0 refills | Status: AC

## 2020-11-02 NOTE — Assessment & Plan Note (Signed)
Acute x 24 hours of symptoms - test positive at home.  Is vaccinated.  At this time recommend self quarantine for total of 10 days to prevent infection of others.  Discussed oral treatment options that are recommended per guidelines -- Paxlovid and Molnupiravir.  At this time will send in [facility], educated him on medication use and side effects -- with goal to decrease risk of hospitalization.  Recommend: - Increased rest - Increasing Fluids - Ibuprofen as needed for fever/pain.  - Mucinex or Coricidin Return in 10 days for follow-up.

## 2020-11-02 NOTE — Patient Instructions (Signed)

## 2020-11-02 NOTE — Progress Notes (Signed)
Temp 99 F (37.2 C) (Oral)    Subjective:    Patient ID: Tyler Morgan, male    DOB: February 02, 1946, 75 y.o.   MRN: [id number]  HPI: Tyler Morgan is a 75 y.o. male  Chief Complaint  Patient presents with   Covid Positive    Patient tested positive on the Rapid and PCR test that he had performed yesterday around 1 pm. Patient c/o symptoms starting yesterday morning of fever (last night 100.4-100.6) and this morning low-grade fever 99.6-99.0, raspy scratchy throat (voice) and a cough. Patient denies trying any medications over the counter to help.    This visit was completed via video visit through MyChart due to the restrictions of the COVID-19 pandemic. All issues as above were discussed and addressed. Physical exam was done as above through visual confirmation on video through MyChart. If it was felt that the patient should be evaluated in the office, they were directed there. The patient verbally consented to this visit. Location of the patient: home Location of the provider: work Those involved with this call:  Provider: Marnee Guarneri, DNP CMA: Irena Reichmann, [place] Desk/Registration: Barth Kirks  Time spent on call:  21 minutes with patient face to face via video conference. More than 50% of this time was spent in counseling and coordination of care. 15 minutes total spent in review of patient's record and preparation of their chart.  I verified patient identity using two factors (patient name and date of birth). Patient consents verbally to being seen via telemedicine visit today.    COVID POSITIVE Tested positive yesterday and symptoms started yesterday -- started with raspy throat and hoarseness.  Has had 4 Covid vaccines.  A little back pain this morning.  Wife initially sick with symptoms on Friday and she is currently taking Zpack and Allegra prescribed by her PCP. Fever: yes 100.4 to 100.6 -- this morning 99.6 Cough: yes Shortness of breath: occasional last  night Wheezing: no Chest pain: no Chest tightness: no Chest congestion: yes Nasal congestion: no Runny nose: yes Post nasal drip: yes Sneezing: no Sore throat:  raspy feeling Swollen glands: no Sinus pressure: no Headache: yes Face pain: no Toothache: no Ear pain: none Ear pressure: none Eyes red/itching:no Eye drainage/crusting: no  Vomiting: no Rash: no Fatigue: yes Sick contacts: yes Strep contacts: no  Context: fluctuating Recurrent sinusitis: no Relief with OTC cold/cough medications: yes  Treatments attempted: anti-histamine    Relevant past medical, surgical, family and social history reviewed and updated as indicated. Interim medical history since our last visit reviewed. Allergies and medications reviewed and updated.  Review of Systems  Constitutional:  Positive for fatigue and fever. Negative for activity change and diaphoresis.  HENT:  Positive for postnasal drip, rhinorrhea and voice change. Negative for congestion, sinus pressure, sinus pain, sneezing and sore throat.   Respiratory:  Positive for cough. Negative for chest tightness, shortness of breath and wheezing.   Cardiovascular:  Negative for chest pain, palpitations and leg swelling.  Gastrointestinal: Negative.   Neurological:  Positive for headaches.  Psychiatric/Behavioral: Negative.     Per HPI unless specifically indicated above     Objective:    Temp 99 F (37.2 C) (Oral)   Wt Readings from Last 3 Encounters:  09/29/20 180 lb 3.2 oz (81.7 kg)  09/01/20 177 lb 12.8 oz (80.6 kg)  03/04/20 182 lb (82.6 kg)    Physical Exam Vitals and nursing note reviewed.  Constitutional:  General: He is awake. He is not in acute distress.    Appearance: He is well-developed and well-groomed. He is not ill-appearing or toxic-appearing.  HENT:     Head: Normocephalic.     Right Ear: Hearing normal. No drainage.     Left Ear: Hearing normal. No drainage.  Eyes:     General: Lids are normal.         Right eye: No discharge.        Left eye: No discharge.     Conjunctiva/sclera: Conjunctivae normal.  Pulmonary:     Effort: Pulmonary effort is normal. No accessory muscle usage or respiratory distress.  Musculoskeletal:     Cervical back: Normal range of motion.  Neurological:     Mental Status: He is alert and oriented to person, place, and time.  Psychiatric:        Mood and Affect: Mood normal.        Behavior: Behavior normal. Behavior is cooperative.        Thought Content: Thought content normal.        Judgment: Judgment normal.    Results for orders placed or performed in visit on 09/01/20  Bayer DCA Hb A1c Waived  Result Value Ref Range   HB A1C (BAYER DCA - WAIVED) 6.2 <7.0 %  Comprehensive metabolic panel  Result Value Ref Range   Glucose 110 (H) 65 - 99 mg/dL   BUN 18 8 - 27 mg/dL   Creatinine, Ser 0.82 0.76 - 1.27 mg/dL   eGFR 92 >59 mL/min/1.73   BUN/Creatinine Ratio 22 10 - 24   Sodium 139 134 - 144 mmol/L   Potassium 4.3 3.5 - 5.2 mmol/L   Chloride 101 96 - 106 mmol/L   CO2 21 20 - 29 mmol/L   Calcium 9.5 8.6 - 10.2 mg/dL   Total Protein 6.8 6.0 - 8.5 g/dL   Albumin 4.5 3.7 - 4.7 g/dL   Globulin, Total 2.3 1.5 - 4.5 g/dL   Albumin/Globulin Ratio 2.0 1.2 - 2.2   Bilirubin Total 0.6 0.0 - 1.2 mg/dL   Alkaline Phosphatase 50 44 - 121 IU/L   AST 18 0 - 40 IU/L   ALT 14 0 - 44 IU/L  Lipid Panel w/o Chol/HDL Ratio  Result Value Ref Range   Cholesterol, Total 150 100 - 199 mg/dL   Triglycerides 115 0 - 149 mg/dL   HDL 46 >39 mg/dL   VLDL Cholesterol Cal 21 5 - 40 mg/dL   LDL Chol Calc (NIH) 83 0 - 99 mg/dL      Assessment & Plan:   Problem List Items Addressed This Visit       Other   COVID-19 virus RNA test result positive at limit of detection    Acute x 24 hours of symptoms - test positive at home.  Is vaccinated.  At this time recommend self quarantine for total of 10 days to prevent infection of others.  Discussed oral treatment options  that are recommended per guidelines -- Paxlovid and Molnupiravir.  At this time will send in [facility], educated him on medication use and side effects -- with goal to decrease risk of hospitalization.  Recommend: - Increased rest - Increasing Fluids - Ibuprofen as needed for fever/pain.  - Mucinex or Coricidin Return in 10 days for follow-up.       I discussed the assessment and treatment plan with the patient. The patient was provided an opportunity to ask questions and all were answered. The  patient agreed with the plan and demonstrated an understanding of the instructions.   The patient was advised to call back or seek an in-person evaluation if the symptoms worsen or if the condition fails to improve as anticipated.   I provided 21+ minutes of time during this encounter.   Follow up plan: Return in about 10 days (around 11/12/2020) for Covid follow-up and lung check.Marland Kitchen

## 2020-11-10 ENCOUNTER — Ambulatory Visit: Payer: Medicare Other

## 2020-11-12 ENCOUNTER — Ambulatory Visit (INDEPENDENT_AMBULATORY_CARE_PROVIDER_SITE_OTHER): Payer: Medicare Other

## 2020-11-12 VITALS — Ht 72.0 in | Wt 175.0 lb

## 2020-11-12 DIAGNOSIS — Z0000 Encounter for general adult medical examination without abnormal findings: Secondary | ICD-10-CM

## 2020-11-12 NOTE — Patient Instructions (Signed)
Tyler Morgan , Thank you for taking time to come for your Medicare Wellness Visit. I appreciate your ongoing commitment to your health goals. Please review the following plan we discussed and let me know if I can assist you in the future.   Screening recommendations/referrals: Colonoscopy: completed 03/27/2018 Recommended yearly ophthalmology/optometry visit for glaucoma screening and checkup Recommended yearly dental visit for hygiene and checkup  Vaccinations: Influenza vaccine: due Pneumococcal vaccine: completed 07/21/2015 Tdap vaccine: completed 09/01/2020, due 09/02/2030 Shingles vaccine: needs second dose   Covid-19:  07/23/2020, 10/21/2019, 04/22/2019, 04/01/2019  Advanced directives: Advance directive discussed with you today.   Conditions/risks identified: none  Next appointment: Follow up in one year for your annual wellness visit.   Preventive Care 8 Years and Older, Male Preventive care refers to lifestyle choices and visits with your health care provider that can promote health and wellness. What does preventive care include? A yearly physical exam. This is also called an annual well check. Dental exams once or twice a year. Routine eye exams. Ask your health care provider how often you should have your eyes checked. Personal lifestyle choices, including: Daily care of your teeth and gums. Regular physical activity. Eating a healthy diet. Avoiding tobacco and drug use. Limiting alcohol use. Practicing safe sex. Taking low doses of aspirin every day. Taking vitamin and mineral supplements as recommended by your health care provider. What happens during an annual well check? The services and screenings done by your health care provider during your annual well check will depend on your age, overall health, lifestyle risk factors, and family history of disease. Counseling  Your health care provider may ask you questions about your: Alcohol use. Tobacco use. Drug  use. Emotional well-being. Home and relationship well-being. Sexual activity. Eating habits. History of falls. Memory and ability to understand (cognition). Work and work Statistician. Screening  You may have the following tests or measurements: Height, weight, and BMI. Blood pressure. Lipid and cholesterol levels. These may be checked every 5 years, or more frequently if you are over 70 years old. Skin check. Lung cancer screening. You may have this screening every year starting at age 19 if you have a 30-pack-year history of smoking and currently smoke or have quit within the past 15 years. Fecal occult blood test (FOBT) of the stool. You may have this test every year starting at age 67. Flexible sigmoidoscopy or colonoscopy. You may have a sigmoidoscopy every 5 years or a colonoscopy every 10 years starting at age 64. Prostate cancer screening. Recommendations will vary depending on your family history and other risks. Hepatitis C blood test. Hepatitis B blood test. Sexually transmitted disease (STD) testing. Diabetes screening. This is done by checking your blood sugar (glucose) after you have not eaten for a while (fasting). You may have this done every 1-3 years. Abdominal aortic aneurysm (AAA) screening. You may need this if you are a current or former smoker. Osteoporosis. You may be screened starting at age 3 if you are at high risk. Talk with your health care provider about your test results, treatment options, and if necessary, the need for more tests. Vaccines  Your health care provider may recommend certain vaccines, such as: Influenza vaccine. This is recommended every year. Tetanus, diphtheria, and acellular pertussis (Tdap, Td) vaccine. You may need a Td booster every 10 years. Zoster vaccine. You may need this after age 64. Pneumococcal 13-valent conjugate (PCV13) vaccine. One dose is recommended after age 24. Pneumococcal polysaccharide (PPSV23) vaccine. One dose  is  recommended after age 63. Talk to your health care provider about which screenings and vaccines you need and how often you need them. This information is not intended to replace advice given to you by your health care provider. Make sure you discuss any questions you have with your health care provider. Document Released: 03/05/2015 Document Revised: 10/27/2015 Document Reviewed: 12/08/2014 Elsevier Interactive Patient Education  2017 [place] Prevention in the Home Falls can cause injuries. They can happen to people of all ages. There are many things you can do to make your home safe and to help prevent falls. What can I do on the outside of my home? Regularly fix the edges of walkways and driveways and fix any cracks. Remove anything that might make you trip as you walk through a door, such as a raised step or threshold. Trim any bushes or trees on the path to your home. Use bright outdoor lighting. Clear any walking paths of anything that might make someone trip, such as rocks or tools. Regularly check to see if handrails are loose or broken. Make sure that both sides of any steps have handrails. Any raised decks and porches should have guardrails on the edges. Have any leaves, snow, or ice cleared regularly. Use sand or salt on walking paths during winter. Clean up any spills in your garage right away. This includes oil or grease spills. What can I do in the bathroom? Use night lights. Install grab bars by the toilet and in the tub and shower. Do not use towel bars as grab bars. Use non-skid mats or decals in the tub or shower. If you need to sit down in the shower, use a plastic, non-slip stool. Keep the floor dry. Clean up any water that spills on the floor as soon as it happens. Remove soap buildup in the tub or shower regularly. Attach bath mats securely with double-sided non-slip rug tape. Do not have throw rugs and other things on the floor that can make you  trip. What can I do in the bedroom? Use night lights. Make sure that you have a light by your bed that is easy to reach. Do not use any sheets or blankets that are too big for your bed. They should not hang down onto the floor. Have a firm chair that has side arms. You can use this for support while you get dressed. Do not have throw rugs and other things on the floor that can make you trip. What can I do in the kitchen? Clean up any spills right away. Avoid walking on wet floors. Keep items that you use a lot in easy-to-reach places. If you need to reach something above you, use a strong step stool that has a grab bar. Keep electrical cords out of the way. Do not use floor polish or wax that makes floors slippery. If you must use wax, use non-skid floor wax. Do not have throw rugs and other things on the floor that can make you trip. What can I do with my stairs? Do not leave any items on the stairs. Make sure that there are handrails on both sides of the stairs and use them. Fix handrails that are broken or loose. Make sure that handrails are as long as the stairways. Check any carpeting to make sure that it is firmly attached to the stairs. Fix any carpet that is loose or worn. Avoid having throw rugs at the top or bottom of the stairs.  If you do have throw rugs, attach them to the floor with carpet tape. Make sure that you have a light switch at the top of the stairs and the bottom of the stairs. If you do not have them, ask someone to add them for you. What else can I do to help prevent falls? Wear shoes that: Do not have high heels. Have rubber bottoms. Are comfortable and fit you well. Are closed at the toe. Do not wear sandals. If you use a stepladder: Make sure that it is fully opened. Do not climb a closed stepladder. Make sure that both sides of the stepladder are locked into place. Ask someone to hold it for you, if possible. Clearly mark and make sure that you can  see: Any grab bars or handrails. First and last steps. Where the edge of each step is. Use tools that help you move around (mobility aids) if they are needed. These include: Canes. Walkers. Scooters. Crutches. Turn on the lights when you go into a dark area. Replace any light bulbs as soon as they burn out. Set up your furniture so you have a clear path. Avoid moving your furniture around. If any of your floors are uneven, fix them. If there are any pets around you, be aware of where they are. Review your medicines with your doctor. Some medicines can make you feel dizzy. This can increase your chance of falling. Ask your doctor what other things that you can do to help prevent falls. This information is not intended to replace advice given to you by your health care provider. Make sure you discuss any questions you have with your health care provider. Document Released: 12/03/2008 Document Revised: 07/15/2015 Document Reviewed: 03/13/2014 Elsevier Interactive Patient Education  2017 [employer].

## 2020-11-12 NOTE — Progress Notes (Signed)
I connected with Tyler Morgan today by telephone and verified that I am speaking with the correct person using two identifiers. Location patient: home Location provider: work Persons participating in the virtual visit: Kayle Correa, Glenna Durand LPN.   I discussed the limitations, risks, security and privacy concerns of performing an evaluation and management service by telephone and the availability of in person appointments. I also discussed with the patient that there may be a patient responsible charge related to this service. The patient expressed understanding and verbally consented to this telephonic visit.    Interactive audio and video telecommunications were attempted between this provider and patient, however failed, due to patient having technical difficulties OR patient did not have access to video capability.  We continued and completed visit with audio only.     Vital signs may be patient reported or missing.  Subjective:   Tyler Morgan is a 75 y.o. male who presents for Medicare Annual/Subsequent preventive examination.  Review of Systems     Cardiac Risk Factors include: advanced age (>21men, >28 women);diabetes mellitus;dyslipidemia;hypertension;male gender;sedentary lifestyle     Objective:    Today's Vitals   11/12/20 1031  Weight: 175 lb (79.4 kg)  Height: 6' (1.829 m)   Body mass index is 23.73 kg/m.  Advanced Directives 11/12/2020 11/10/2019 09/23/2018 03/27/2018 09/19/2017 02/07/2017 01/30/2017  Does Patient Have a Medical Advance Directive? No No Yes No No No No  Type of Advance Directive - - Living will;Healthcare Power of Attorney - - - -  Copy of [place] in Chart? - - No - copy requested - - - -  Would patient like information on creating a medical advance directive? - - - No - Patient declined Yes (MAU/Ambulatory/Procedural Areas - Information given) No - Patient declined -    Current Medications (verified) Outpatient  Encounter Medications as of 11/12/2020  Medication Sig   atenolol (TENORMIN) 50 MG tablet Take 1 tablet (50 mg total) by mouth daily.   atorvastatin (LIPITOR) 20 MG tablet Take 1 tablet (20 mg total) by mouth daily.   benazepril (LOTENSIN) 40 MG tablet Take 1 tablet (40 mg total) by mouth daily.   Cholecalciferol (VITAMIN D-3) 1000 units CAPS Take 1,000 Units by mouth daily.    metFORMIN (GLUCOPHAGE) 500 MG tablet TAKE ONE TABLET BY MOUTH 3 TIMES DAILY   TURMERIC PO Take 1 capsule by mouth daily.    vitamin B-12 (CYANOCOBALAMIN) 1000 MCG tablet Take 1,000 mcg by mouth daily.   [START ON 03/02/2021] Zoster Vaccine Adjuvanted [facility]) injection Inject 0.5 mLs into the muscle once for 1 dose. Dose # 2   No facility-administered encounter medications on file as of 11/12/2020.    Allergies (verified) Acetaminophen and Amlodipine   History: Past Medical History:  Diagnosis Date   Diabetes mellitus without complication [facility])    ED (erectile dysfunction)    Hypertension    Motion sickness    boats   Neuropathy    Prostate enlargement    Urinary retention    Past Surgical History:  Procedure Laterality Date   BASAL CELL CARCINOMA EXCISION     nose    CATARACT EXTRACTION Bilateral 1986, 2014   COLONOSCOPY WITH PROPOFOL N/A 03/27/2018   Procedure: COLONOSCOPY WITH BIOPSIES;  Surgeon: Lin Landsman, MD;  Location: [facility];  Service: Endoscopy;  Laterality: N/A;  Diabetic - oral meds   EYE SURGERY Bilateral 1093,2355   FRACTURE SURGERY Right    as a child/ right arm  HERNIA REPAIR Right 01-13-14   inguinal hernia   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 N/A 02/07/2017   Procedure: 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;  Surgeon: Hollice Espy, MD;  Location: [facility];  Service: Urology;  Laterality: N/A;   POLYPECTOMY N/A 03/27/2018   Procedure: POLYPECTOMY;  Surgeon: Lin Landsman, MD;  Location: [facility];   Service: Endoscopy;  Laterality: N/A;   RETINAL DETACHMENT SURGERY  Jan 2015   TONSILLECTOMY  1955   Family History  Problem Relation Age of Onset   Colon cancer Father    Angina Father    Macular degeneration Father    Alzheimer's disease Mother    Prostate cancer Neg Hx    Bladder Cancer Neg Hx    Kidney cancer Neg Hx    Social History   Socioeconomic History   Marital status: Married    Spouse name: Not on file   Number of children: Not on file   Years of education: Not on file   Highest education level: Bachelor's degree (e.g., BA, AB, BS)  Occupational History   Not on file  Tobacco Use   Smoking status: Former    Packs/day: 1.00    Years: 13.00    Pack years: 13.00    Types: Cigarettes    Quit date: 02/21/1984    Years since quitting: 36.7   Smokeless tobacco: Never  Vaping Use   Vaping Use: Never used  Substance and Sexual Activity   Alcohol use: Yes    Alcohol/week: 2.0 standard drinks    Types: 2 Cans of beer per week    Comment: wine occasionally    Drug use: No   Sexual activity: Not on file  Other Topics Concern   Not on file  Social History Narrative   Working fulltime    Social Determinants of Radio broadcast assistant Strain: Low Risk    Difficulty of Paying Living Expenses: Not hard at all  Food Insecurity: No Food Insecurity   Worried About Charity fundraiser in the Last Year: Never true   Arboriculturist in the Last Year: Never true  Transportation Needs: No Transportation Needs   Lack of Transportation (Medical): No   Lack of Transportation (Non-Medical): No  Physical Activity: Inactive   Days of Exercise per Week: 0 days   Minutes of Exercise per Session: 0 min  Stress: No Stress Concern Present   Feeling of Stress : Not at all  Social Connections: Not on file    Tobacco Counseling Counseling given: Not Answered   Clinical Intake:  Pre-visit preparation completed: Yes  Pain : No/denies pain     Nutritional Status: BMI  of 19-24  Normal Nutritional Risks: None Diabetes: Yes  How often do you need to have someone help you when you read instructions, pamphlets, or other written materials from your doctor or pharmacy?: 1 - Never What is the last grade level you completed in school?: college  Diabetic? Yes Nutrition Risk Assessment:  Has the patient had any N/V/D within the last 2 months?  No  Does the patient have any non-healing wounds?  No  Has the patient had any unintentional weight loss or weight gain?  No   Diabetes:  Is the patient diabetic?  Yes  If diabetic, was a CBG obtained today?  No  Did the patient bring in their glucometer from home?  No  How often do you monitor your CBG's? Does not.  Financial Strains and Diabetes Management:  Are you having any financial strains with the device, your supplies or your medication? No .  Does the patient want to be seen by Chronic Care Management for management of their diabetes?  No  Would the patient like to be referred to a Nutritionist or for Diabetic Management?  No   Diabetic Exams:  Diabetic Eye Exam: Overdue for diabetic eye exam. Pt has been advised about the importance in completing this exam. Patient advised to call and schedule an eye exam. Diabetic Foot Exam: Completed 11/28/2019   Interpreter Needed?: No  Information entered by :: NAllen LPN   Activities of Daily Living In your present state of health, do you have any difficulty performing the following activities: 11/12/2020 11/28/2019  Hearing? N N  Vision? N N  Difficulty concentrating or making decisions? N N  Walking or climbing stairs? N N  Dressing or bathing? N N  Doing errands, shopping? N N  Preparing Food and eating ? N -  Using the Toilet? N -  In the past six months, have you accidently leaked urine? N -  Do you have problems with loss of bowel control? N -  Managing your Medications? N -  Managing your Finances? N -  Housekeeping or managing your  Housekeeping? N -  Some recent data might be hidden    Patient Care Team: Venita Lick, NP as PCP - General (Nurse Practitioner) Christene Lye, MD (General Surgery) Schnier, Dolores Lory, MD (Vascular Surgery)  Indicate any recent Medical Services you may have received from other than [facility] providers in the past year (date may be approximate).     Assessment:   This is a routine wellness examination for Tyler Morgan.  Hearing/Vision screen Vision Screening - Comments:: No regular eye Conrad  Dietary issues and exercise activities discussed: Current Exercise Habits: The patient does not participate in regular exercise at present   Goals Addressed             This Visit's Progress    Patient Stated       11/12/2020, no goals       Depression Screen PHQ 2/9 Scores 11/12/2020 03/04/2020 11/10/2019 09/23/2018 09/19/2017 07/30/2017 01/26/2017  PHQ - 2 Score 0 0 0 0 0 0 0    Fall Risk Fall Risk  11/12/2020 03/04/2020 11/10/2019 09/23/2018 09/19/2017  Falls in the past year? 0 0 1 0 Yes  Comment - - slips - -  Number falls in past yr: - - 1 - 1  Injury with Fall? - - 0 - No  Risk for fall due to : Medication side effect - Medication side effect;History of fall(s) - -  Follow up Falls evaluation completed;Education provided;Falls prevention discussed - Falls evaluation completed;Education provided;Falls prevention discussed - -    FALL RISK PREVENTION PERTAINING TO THE HOME:  Any stairs in or around the home? Yes  If so, are there any without handrails? No  Home free of loose throw rugs in walkways, pet beds, electrical cords, etc? Yes  Adequate lighting in your home to reduce risk of falls? Yes   ASSISTIVE DEVICES UTILIZED TO PREVENT FALLS:  Life alert? No  Use of a cane, walker or w/c? No  Grab bars in the bathroom? Yes  Shower chair or bench in shower? No  Elevated toilet seat or a handicapped toilet? Yes   TIMED UP AND GO:  Was the test performed?  No .  Cognitive Function:     6CIT Screen 11/12/2020 11/10/2019 09/19/2017 09/14/2016  What Year? 0 points 0 points 0 points 0 points  What month? 0 points 0 points 0 points 0 points  What time? 0 points 0 points 0 points 0 points  Count back from 20 0 points 0 points 0 points 0 points  Months in reverse 0 points 0 points 0 points 0 points  Repeat phrase 2 points 2 points 0 points 2 points  Total Score 2 2 0 2    Immunizations Immunization History  Administered Date(s) Administered   Fluad Quad(high Dose 65+) 11/28/2019   Influenza, High Dose Seasonal PF 11/09/2015, 12/15/2016, 12/13/2017, 11/06/2018   Influenza,inj,Quad PF,6+ Mos 12/15/2014   Influenza-Unspecified 12/19/2016   PFIZER(Purple Top)SARS-COV-2 Vaccination 04/01/2019, 04/22/2019, 10/21/2019, 07/23/2020   Pneumococcal Conjugate-13 05/13/2014   Pneumococcal Polysaccharide-23 07/21/2015   Pneumococcal-Unspecified 01/09/2008   Td 09/01/2020   Tdap 09/02/2009   Zoster Recombinat (Shingrix) 09/14/2020   Zoster, Live 01/09/2008    TDAP status: Up to date  Flu Vaccine status: Due, Education has been provided regarding the importance of this vaccine. Advised may receive this vaccine at local pharmacy or Health Dept. Aware to provide a copy of the vaccination record if obtained from local pharmacy or Health Dept. Verbalized acceptance and understanding.  Pneumococcal vaccine status: Up to date  Covid-19 vaccine status: Completed vaccines  Qualifies for Shingles Vaccine? Yes   Zostavax completed Yes   Shingrix Completed?: needs second dose  Screening Tests Health Maintenance  Topic Date Due   OPHTHALMOLOGY EXAM  03/04/2020   INFLUENZA VACCINE  09/20/2020   Zoster Vaccines- Shingrix (2 of 2) 11/09/2020   COVID-19 Vaccine (5 - Booster for Pfizer series) 11/22/2020   FOOT EXAM  11/27/2020   HEMOGLOBIN A1C  03/04/2021   COLONOSCOPY (Pts 45-39yrs Insurance coverage will need to be confirmed)  03/28/2023    TETANUS/TDAP  09/02/2030   Hepatitis C Screening  Completed   HPV VACCINES  Aged Out    Health Maintenance  Health Maintenance Due  Topic Date Due   OPHTHALMOLOGY EXAM  03/04/2020   INFLUENZA VACCINE  09/20/2020   Zoster Vaccines- Shingrix (2 of 2) 11/09/2020    Colorectal cancer screening: Type of screening: Colonoscopy. Completed 03/27/2018. Repeat every 5 years  Lung Cancer Screening: (Low Dose CT Chest recommended if Age 21-80 years, 30 pack-year currently smoking OR have quit w/in 15years.) does not qualify.   Lung Cancer Screening Referral: no  Additional Screening:  Hepatitis C Screening: does qualify; Completed 07/21/2015  Vision Screening: Recommended annual ophthalmology exams for early detection of glaucoma and other disorders of the eye. Is the patient up to date with their annual eye exam?  No  Who is the provider or what is the name of the office in which the patient attends annual eye exams? [facility] If pt is not established with a provider, would they like to be referred to a provider to establish care? No .   Dental Screening: Recommended annual dental exams for proper oral hygiene  Community Resource Referral / Chronic Care Management: CRR required this visit?  No   CCM required this visit?  No      Plan:     I have personally reviewed and noted the following in the patient's chart:   Medical and social history Use of alcohol, tobacco or illicit drugs  Current medications and supplements including opioid prescriptions. Patient is not currently taking opioid prescriptions. Functional ability and status Nutritional  status Physical activity Advanced directives List of other physicians Hospitalizations, surgeries, and ER visits in previous 12 months Vitals Screenings to include cognitive, depression, and falls Referrals and appointments  In addition, I have reviewed and discussed with patient certain preventive protocols, quality metrics,  and best practice recommendations. A written personalized care plan for preventive services as well as general preventive health recommendations were provided to patient.     Kellie Simmering, LPN   [id number]   Nurse Notes:

## 2020-11-29 ENCOUNTER — Other Ambulatory Visit: Payer: Self-pay

## 2020-11-29 ENCOUNTER — Encounter: Payer: Self-pay | Admitting: Nurse Practitioner

## 2020-11-29 ENCOUNTER — Ambulatory Visit (INDEPENDENT_AMBULATORY_CARE_PROVIDER_SITE_OTHER): Payer: Medicare Other | Admitting: Nurse Practitioner

## 2020-11-29 VITALS — BP 111/64 | HR 71 | Temp 97.6°F | Ht 72.0 in | Wt 181.8 lb

## 2020-11-29 DIAGNOSIS — Z0000 Encounter for general adult medical examination without abnormal findings: Secondary | ICD-10-CM

## 2020-11-29 DIAGNOSIS — E1169 Type 2 diabetes mellitus with other specified complication: Secondary | ICD-10-CM

## 2020-11-29 DIAGNOSIS — E785 Hyperlipidemia, unspecified: Secondary | ICD-10-CM | POA: Diagnosis not present

## 2020-11-29 DIAGNOSIS — Z23 Encounter for immunization: Secondary | ICD-10-CM

## 2020-11-29 DIAGNOSIS — H6123 Impacted cerumen, bilateral: Secondary | ICD-10-CM

## 2020-11-29 DIAGNOSIS — Z8546 Personal history of malignant neoplasm of prostate: Secondary | ICD-10-CM

## 2020-11-29 DIAGNOSIS — E1140 Type 2 diabetes mellitus with diabetic neuropathy, unspecified: Secondary | ICD-10-CM

## 2020-11-29 DIAGNOSIS — I152 Hypertension secondary to endocrine disorders: Secondary | ICD-10-CM

## 2020-11-29 DIAGNOSIS — E1159 Type 2 diabetes mellitus with other circulatory complications: Secondary | ICD-10-CM

## 2020-11-29 DIAGNOSIS — Z7189 Other specified counseling: Secondary | ICD-10-CM | POA: Diagnosis not present

## 2020-11-29 DIAGNOSIS — E538 Deficiency of other specified B group vitamins: Secondary | ICD-10-CM | POA: Diagnosis not present

## 2020-11-29 LAB — BAYER DCA HB A1C WAIVED: HB A1C (BAYER DCA - WAIVED): 6.1 % — ABNORMAL HIGH (ref 4.8–5.6)

## 2020-11-29 NOTE — Assessment & Plan Note (Signed)
Recent visit with Dr. Erlene Quan and is cancer free.  Continue collaboration with urology and PSA [id number].

## 2020-11-29 NOTE — Assessment & Plan Note (Signed)
Ongoing, stable on recent check.  Check B12 today, continue supplement daily. 

## 2020-11-29 NOTE — Progress Notes (Signed)
BP 111/64   Pulse 71   Temp 97.6 F (36.4 C) (Oral)   Ht 6' (1.829 m)   Wt 181 lb 12.8 oz (82.5 kg)   SpO2 96%   BMI 24.66 kg/m    Subjective:    Patient ID: Tyler Morgan, male    DOB: [birth year], 75 y.o.   MRN: [id number]  HPI: Tyler Morgan is a 75 y.o. male presenting on 11/29/2020 for comprehensive medical examination. Current medical complaints include:none  He currently lives with: wife Interim Problems from his last visit: no   DIABETES Last A1C 6.2% and continues Metformin taking 500 MG TID.  Was on Jardiance in past, on for less than a year.  Recent B12 level 881, improved levels. Hypoglycemic episodes:no Polydipsia/polyuria: no Visual disturbance: no Chest pain: no Paresthesias: no Glucose Monitoring: no             Accucheck frequency: Not Checking             Fasting glucose:             Post prandial:             Evening:             Before meals: Taking Insulin?: no             Long acting insulin:             Short acting insulin: Blood Pressure Monitoring: rarely Retinal Examination: Not up to Date -- goes to [place] Exam: Up to Date Pneumovax: Up to Date Influenza: Up to Date Aspirin: no    HYPERTENSION / HYPERLIPIDEMIA Continues on Benazepril and Atenolol + Lipitor. Satisfied with current treatment? yes Duration of hypertension: chronic BP monitoring frequency: rarely BP range: <120/80 BP medication side effects: no Duration of hyperlipidemia: chronic Cholesterol medication side effects: no Cholesterol supplements: none Medication compliance: good compliance Aspirin: no Recent stressors: no Recurrent headaches: no Visual changes: no Palpitations: no Dyspnea: no Chest pain: no Lower extremity edema: no Dizzy/lightheaded: no    PROSTATE CANCER: Followed by Dr. Erlene Quan and last saw 08/04/20, was told at recent visit he was "cancer free".  Continues to have monitoring of PSA, every 6 months.     Functional Status  Survey: Is the patient deaf or have difficulty hearing?: No Does the patient have difficulty seeing, even when wearing glasses/contacts?: No Does the patient have difficulty concentrating, remembering, or making decisions?: No Does the patient have difficulty walking or climbing stairs?: No Does the patient have difficulty dressing or bathing?: No Does the patient have difficulty doing errands alone such as visiting a doctor's office or shopping?: No  FALL RISK: Fall Risk  11/29/2020 11/12/2020 03/04/2020 11/10/2019 09/23/2018  Falls in the past year? 0 0 0 1 0  Comment - - - slips -  Number falls in past yr: 0 - - 1 -  Injury with Fall? 0 - - 0 -  Risk for fall due to : No Fall Risks Medication side effect - Medication side effect;History of fall(s) -  Follow up Falls evaluation completed Falls evaluation completed;Education provided;Falls prevention discussed - Falls evaluation completed;Education provided;Falls prevention discussed -    Depression Screen Depression screen [facility] 2/9 11/29/2020 11/12/2020 03/04/2020 11/10/2019 09/23/2018  Decreased Interest 0 0 0 0 0  Down, Depressed, Hopeless 0 0 0 0 0  PHQ - 2 Score 0 0 0 0 0    Advanced Directives A voluntary discussion about advance care  planning including the explanation and discussion of advance directives was extensively discussed  with the patient for 15 minutes with patient.  Explanation about the health care proxy and Living will was reviewed and packet with forms with explanation of how to fill them out was given.  During this discussion, the patient was able to identify a health care proxy as ** and plans/does not plan to fill out the paperwork required.  Patient was offered a separate [place] visit for further assistance with forms.     Past Medical History:  Past Medical History:  Diagnosis Date   Diabetes mellitus without complication [facility])    ED (erectile dysfunction)    Hypertension    Motion sickness    boats    Neuropathy    Prostate enlargement    Urinary retention     Surgical History:  Past Surgical History:  Procedure Laterality Date   BASAL CELL CARCINOMA EXCISION     nose    CATARACT EXTRACTION Bilateral 1986, 2014   COLONOSCOPY WITH PROPOFOL N/A 03/27/2018   Procedure: COLONOSCOPY WITH BIOPSIES;  Surgeon: Lin Landsman, MD;  Location: [place];  Service: Endoscopy;  Laterality: N/A;  Diabetic - oral meds   EYE SURGERY Bilateral [phone]   FRACTURE SURGERY Right    as a child/ right arm   HERNIA REPAIR Right 01-13-14   inguinal hernia   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 N/A 02/07/2017   Procedure: 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;  Surgeon: Hollice Espy, MD;  Location: [facility];  Service: Urology;  Laterality: N/A;   POLYPECTOMY N/A 03/27/2018   Procedure: POLYPECTOMY;  Surgeon: Lin Landsman, MD;  Location: [place];  Service: Endoscopy;  Laterality: N/A;   RETINAL DETACHMENT SURGERY  Jan 2015   TONSILLECTOMY  1955    Medications:  Current Outpatient Medications on File Prior to Visit  Medication Sig   atenolol (TENORMIN) 50 MG tablet Take 1 tablet (50 mg total) by mouth daily.   atorvastatin (LIPITOR) 20 MG tablet Take 1 tablet (20 mg total) by mouth daily.   benazepril (LOTENSIN) 40 MG tablet Take 1 tablet (40 mg total) by mouth daily.   Cholecalciferol (VITAMIN D-3) 1000 units CAPS Take 1,000 Units by mouth daily.    metFORMIN (GLUCOPHAGE) 500 MG tablet TAKE ONE TABLET BY MOUTH 3 TIMES DAILY   TURMERIC PO Take 1 capsule by mouth daily.    vitamin B-12 (CYANOCOBALAMIN) 1000 MCG tablet Take 1,000 mcg by mouth daily.   [START ON 03/02/2021] Zoster Vaccine Adjuvanted [facility]) injection Inject 0.5 mLs into the muscle once for 1 dose. Dose # 2   No current facility-administered medications on file prior to visit.    Allergies:  Allergies  Allergen Reactions   Acetaminophen Swelling and  Other (See Comments)    Swelling of liver    Amlodipine Swelling    Social History:  Social History   Socioeconomic History   Marital status: Married    Spouse name: Not on file   Number of children: Not on file   Years of education: Not on file   Highest education level: Bachelor's degree (e.g., BA, AB, BS)  Occupational History   Not on file  Tobacco Use   Smoking status: Former    Packs/day: 1.00    Years: 13.00    Pack years: 13.00    Types: Cigarettes    Quit date: 02/21/1984    Years since quitting: 21.7  Smokeless tobacco: Never  Vaping Use   Vaping Use: Never used  Substance and Sexual Activity   Alcohol use: Yes    Alcohol/week: 2.0 standard drinks    Types: 2 Cans of beer per week    Comment: wine occasionally    Drug use: No   Sexual activity: Not on file  Other Topics Concern   Not on file  Social History Narrative   Working fulltime    Social Determinants of Radio broadcast assistant Strain: Low Risk    Difficulty of Paying Living Expenses: Not hard at all  Food Insecurity: No Food Insecurity   Worried About Charity fundraiser in the Last Year: Never true   Arboriculturist in the Last Year: Never true  Transportation Needs: No Transportation Needs   Lack of Transportation (Medical): No   Lack of Transportation (Non-Medical): No  Physical Activity: Inactive   Days of Exercise per Week: 0 days   Minutes of Exercise per Session: 0 min  Stress: No Stress Concern Present   Feeling of Stress : Not at all  Social Connections: Not on file  Intimate Partner Violence: Not on file   Social History   Tobacco Use  Smoking Status Former   Packs/day: 1.00   Years: 13.00   Pack years: 13.00   Types: Cigarettes   Quit date: 02/21/1984   Years since quitting: 36.7  Smokeless Tobacco Never   Social History   Substance and Sexual Activity  Alcohol Use Yes   Alcohol/week: 2.0 standard drinks   Types: 2 Cans of beer per week   Comment: wine  occasionally     Family History:  Family History  Problem Relation Age of Onset   Colon cancer Father    Angina Father    Macular degeneration Father    Alzheimer's disease Mother    Prostate cancer Neg Hx    Bladder Cancer Neg Hx    Kidney cancer Neg Hx     Past medical history, surgical history, medications, allergies, family history and social history reviewed with patient today and changes made to appropriate areas of the chart.   Review of Systems - negative All other ROS negative except what is listed above and in the HPI.      Objective:    BP 111/64   Pulse 71   Temp 97.6 F (36.4 C) (Oral)   Ht 6' (1.829 m)   Wt 181 lb 12.8 oz (82.5 kg)   SpO2 96%   BMI 24.66 kg/m   Wt Readings from Last 3 Encounters:  11/29/20 181 lb 12.8 oz (82.5 kg)  11/12/20 175 lb (79.4 kg)  09/29/20 180 lb 3.2 oz (81.7 kg)    Physical Exam Vitals and nursing note reviewed.  Constitutional:      General: He is awake. He is not in acute distress.    Appearance: He is well-developed and well-groomed. He is not ill-appearing.  HENT:     Head: Normocephalic and atraumatic.     Right Ear: Hearing, ear canal and external ear normal. No drainage. There is impacted cerumen.     Left Ear: Hearing, ear canal and external ear normal. No drainage. There is impacted cerumen.     Ears:     Comments: Cerumen impaction bilaterally, ears irrigated by CMA with full clearance and able to visualize bilateral TM post procedure.    Nose: Nose normal.     Mouth/Throat:     Pharynx: Uvula midline.  Eyes:     General: Lids are normal.        Right eye: No discharge.        Left eye: No discharge.     Extraocular Movements: Extraocular movements intact.     Conjunctiva/sclera: Conjunctivae normal.     Pupils: Pupils are equal, round, and reactive to light.     Visual Fields: Right eye visual fields normal and left eye visual fields normal.  Neck:     Thyroid: No thyromegaly.     Vascular: No carotid  bruit or JVD.     Trachea: Trachea normal.  Cardiovascular:     Rate and Rhythm: Normal rate and regular rhythm.     Heart sounds: Normal heart sounds, S1 normal and S2 normal. No murmur heard.   No gallop.  Pulmonary:     Effort: Pulmonary effort is normal. No accessory muscle usage or respiratory distress.     Breath sounds: Normal breath sounds.  Abdominal:     General: Bowel sounds are normal.     Palpations: Abdomen is soft. There is no hepatomegaly or splenomegaly.     Tenderness: There is no abdominal tenderness.  Musculoskeletal:        General: Normal range of motion.     Cervical back: Normal range of motion and neck supple.     Right lower leg: No edema.     Left lower leg: No edema.  Lymphadenopathy:     Head:     Right side of head: No submental, submandibular, tonsillar, preauricular or posterior auricular adenopathy.     Left side of head: No submental, submandibular, tonsillar, preauricular or posterior auricular adenopathy.     Cervical: No cervical adenopathy.  Skin:    General: Skin is warm and dry.     Capillary Refill: Capillary refill takes less than 2 seconds.     Findings: No rash.  Neurological:     Mental Status: He is alert and oriented to person, place, and time.     Cranial Nerves: Cranial nerves are intact.     Gait: Gait is intact.     Deep Tendon Reflexes: Reflexes are normal and symmetric.     Reflex Scores:      Brachioradialis reflexes are 2+ on the right side and 2+ on the left side.      Patellar reflexes are 2+ on the right side and 2+ on the left side. Psychiatric:        Attention and Perception: Attention normal.        Mood and Affect: Mood normal.        Speech: Speech normal.        Behavior: Behavior normal. Behavior is cooperative.        Thought Content: Thought content normal.        Cognition and Memory: Cognition normal.        Judgment: Judgment normal.   Diabetic Foot Exam - Simple   Simple Foot Form Visual  Inspection See comments: Yes Sensation Testing See comments: Yes Pulse Check Posterior Tibialis and Dorsalis pulse intact bilaterally: Yes Comments 2+ DP and PT pulses bilaterally.  Skin dry and cool to touch.  Monofilament right 6/10 and left 5/10.  Onychomycosis bilaterally.    Results for orders placed or performed in visit on 11/29/20  Bayer DCA Hb A1c Waived  Result Value Ref Range   HB A1C (BAYER DCA - WAIVED) 6.1 (H) 4.8 - 5.6 %  Assessment & Plan:   Problem List Items Addressed This Visit       Cardiovascular and Mediastinum   Hypertension associated with diabetes ([place])    Chronic, stable with BP at goal.  Urine ALB 10 and A:C <21 March 2020.  Continue current medication regimen, Benazepril offering kidney protection.  Recommend checking BP a few mornings a week at home and documenting + focus on DASH diet.  Monitor for worsening edema and may provide PRN Lasix if presents.  Recommend avoid Ibuprofen products and high salt intake.  Return in 6 months.  Labs today: CMP and TSH.      Relevant Orders   Bayer DCA Hb A1c Waived (Completed)   Comprehensive metabolic panel   TSH     Endocrine   Type 2 diabetes mellitus with diabetic neuropathy ([place]) - Primary    Chronic, ongoing with A1C 6.1%, urine ALB 10 and A:C <30 in January 2022.  Continue Metformin TID and diet focus -- may reduce this dosing at next visit if remains stable.  Recommend he check BS at least three mornings a week and document.  If elevation above goal A1C  would consider increasing Metformin to max or adding SGLT.  Return in 6 months for A1c check.      Hyperlipidemia associated with type 2 diabetes mellitus ([facility])    Chronic, ongoing.  Continue current medication regimen and adjust as needed, recent LDL <70.  Check lipid panel today.       Relevant Orders   Bayer DCA Hb A1c Waived (Completed)   Lipid Panel w/o Chol/HDL Ratio     Other   Advanced care planning/counseling discussion    A  voluntary discussion about advance care planning including the explanation and discussion of advance directives was extensively discussed  with the patient for 15 minutes with patient and provided packet for review.  Highly recommend obtaining an advanced directive and POA.      History of prostate cancer    Recent visit with Dr. Erlene Quan and is cancer free.  Continue collaboration with urology and PSA [id number].      Relevant Orders   PSA   B12 deficiency    Ongoing, stable on recent check.  Check B12 today, continue supplement daily.      Relevant Orders   Vitamin B12   Other Visit Diagnoses     Bilateral impacted cerumen       Noted on exam, ear irrigated by CMA with full clearance and able to view TM bilaterally.  Tolerated well.   Need for influenza vaccination       Flu vaccine today   Relevant Orders   Flu Vaccine QUAD High Dose(Fluad) (Completed)   Encounter for annual physical exam       Annual labs today with health maintenance reviewed.  Flu vaccine today.       Discussed aspirin prophylaxis for myocardial infarction prevention and decision was that we recommended ASA, and patient refused  LABORATORY TESTING:  Health maintenance labs ordered today as discussed above.   The natural history of prostate cancer and ongoing controversy regarding screening and potential treatment outcomes of prostate cancer has been discussed with the patient. The meaning of a false positive PSA and a false negative PSA has been discussed. He indicates understanding of the limitations of this screening test and wishes to proceed with screening PSA testing -- has performed with urology.  IMMUNIZATIONS:   - Tdap: Tetanus vaccination status reviewed: last tetanus booster within  10 years. - Influenza: Up to date - Pneumovax: Up to date - Prevnar: Up to date - Zostavax vaccine: Up to date  SCREENING: - Colonoscopy: Up to date  Discussed with patient purpose of the colonoscopy is to detect  colon cancer at curable precancerous or early stages   - AAA Screening: Not applicable  -Hearing Test: Not applicable  -Spirometry: Not applicable   PATIENT COUNSELING:    Sexuality: Discussed sexually transmitted diseases, partner selection, use of condoms, avoidance of unintended pregnancy  and contraceptive alternatives.   Advised to avoid cigarette smoking.  I discussed with the patient that most people either abstain from alcohol or drink within safe limits (<=14/week and <=4 drinks/occasion for males, <=7/weeks and <= 3 drinks/occasion for females) and that the risk for alcohol disorders and other health effects rises proportionally with the number of drinks per week and how often a drinker exceeds daily limits.  Discussed cessation/primary prevention of drug use and availability of treatment for abuse.   Diet: Encouraged to adjust caloric intake to maintain  or achieve ideal body weight, to reduce intake of dietary saturated fat and total fat, to limit sodium intake by avoiding high sodium foods and not adding table salt, and to maintain adequate dietary potassium and calcium preferably from fresh fruits, vegetables, and low-fat dairy products.    Stressed the importance of regular exercise  Injury prevention: Discussed safety belts, safety helmets, smoke detector, smoking near bedding or upholstery.   Dental health: Discussed importance of regular tooth brushing, flossing, and dental visits.   Follow up plan: NEXT PREVENTATIVE PHYSICAL DUE IN 1 YEAR. Return in about 6 months (around 05/30/2021) for T2DM, HTN/hLD, B12.

## 2020-11-29 NOTE — Assessment & Plan Note (Signed)
A voluntary discussion about advance care planning including the explanation and discussion of advance directives was extensively discussed  with the patient for 15 minutes with patient and provided packet for review.  Highly recommend obtaining an advanced directive and POA.

## 2020-11-29 NOTE — Assessment & Plan Note (Signed)
Chronic, ongoing with A1C 6.1%, urine ALB 10 and A:C <30 in January 2022.  Continue Metformin TID and diet focus -- may reduce this dosing at next visit if remains stable.  Recommend he check BS at least three mornings a week and document.  If elevation above goal A1C  would consider increasing Metformin to max or adding SGLT.  Return in 6 months for A1c check.

## 2020-11-29 NOTE — Assessment & Plan Note (Signed)
Chronic, stable with BP at goal.  Urine ALB 10 and A:C <21 March 2020.  Continue current medication regimen, Benazepril offering kidney protection.  Recommend checking BP a few mornings a week at home and documenting + focus on DASH diet.  Monitor for worsening edema and may provide PRN Lasix if presents.  Recommend avoid Ibuprofen products and high salt intake.  Return in 6 months.  Labs today: CMP and TSH.

## 2020-11-29 NOTE — Patient Instructions (Signed)
Peripheral Neuropathy ?Peripheral neuropathy is a type of nerve damage. It affects nerves that carry signals between the spinal cord and the arms, legs, and the rest of the body (peripheral nerves). It does not affect nerves in the spinal cord or brain. In peripheral neuropathy, one nerve or a group of nerves may be damaged. Peripheral neuropathy is a broad category that includes many specific nerve disorders, like diabetic neuropathy, hereditary neuropathy, and carpal tunnel syndrome. ?What are the causes? ?This condition may be caused by: ?Diabetes. This is the most common cause of peripheral neuropathy. ?Nerve injury. ?Pressure or stress on a nerve that lasts a long time. ?Lack (deficiency) of B vitamins. This can result from alcoholism, poor diet, or a restricted diet. ?Infections. ?Autoimmune diseases, such as rheumatoid arthritis and systemic lupus erythematosus. ?Nerve diseases that are passed from parent to child (inherited). ?Some medicines, such as cancer medicines (chemotherapy). ?Poisonous (toxic) substances, such as lead and mercury. ?Too little blood flowing to the legs. ?Kidney disease. ?Thyroid disease. ?In some cases, the cause of this condition is not known. ?What are the signs or symptoms? ?Symptoms of this condition depend on which of your nerves is damaged. Common symptoms include: ?Loss of feeling (numbness) in the feet, hands, or both. ?Tingling in the feet, hands, or both. ?Burning pain. ?Very sensitive skin. ?Weakness. ?Not being able to move a part of the body (paralysis). ?Muscle twitching. ?Clumsiness or poor coordination. ?Loss of balance. ?Not being able to control your bladder. ?Feeling dizzy. ?Sexual problems. ?How is this diagnosed? ?Diagnosing and finding the cause of peripheral neuropathy can be difficult. Your health care provider will take your medical history and do a physical exam. A neurological exam will also be done. This involves checking things that are affected by your  brain, spinal cord, and nerves (nervous system). For example, your health care provider will check your reflexes, how you move, and what you can feel. ?You may have other tests, such as: ?Blood tests. ?Electromyogram (EMG) and nerve conduction tests. These tests check nerve function and how well the nerves are controlling the muscles. ?Imaging tests, such as CT scans or MRI to rule out other causes of your symptoms. ?Removing a small piece of nerve to be examined in a lab (nerve biopsy). ?Removing and examining a small amount of the fluid that surrounds the brain and spinal cord (lumbar puncture). ?How is this treated? ?Treatment for this condition may involve: ?Treating the underlying cause of the neuropathy, such as diabetes, kidney disease, or vitamin deficiencies. ?Stopping medicines that can cause neuropathy, such as chemotherapy. ?Medicine to help relieve pain. Medicines may include: ?Prescription or over-the-counter pain medicine. ?Antiseizure medicine. ?Antidepressants. ?Pain-relieving patches that are applied to painful areas of skin. ?Surgery to relieve pressure on a nerve or to destroy a nerve that is causing pain. ?Physical therapy to help improve movement and balance. ?Devices to help you move around (assistive devices). ?Follow these instructions at home: ?Medicines ?Take over-the-counter and prescription medicines only as told by your health care provider. Do not take any other medicines without first asking your health care provider. ?Do not drive or use heavy machinery while taking prescription pain medicine. ?Lifestyle ? ?Do not use any products that contain nicotine or tobacco, such as cigarettes and e-cigarettes. Smoking keeps blood from reaching damaged nerves. If you need help quitting, ask your health care provider. ?Avoid or limit alcohol. Too much alcohol can cause a vitamin B deficiency, and vitamin B is needed for healthy nerves. ?Eat a   healthy diet. This includes: ?Eating foods that are  high in fiber, such as fresh fruits and vegetables, whole grains, and beans. ?Limiting foods that are high in fat and processed sugars, such as fried or sweet foods. ?General instructions ? ?If you have diabetes, work closely with your health care provider to keep your blood sugar under control. ?If you have numbness in your feet: ?Check every day for signs of injury or infection. Watch for redness, warmth, and swelling. ?Wear padded socks and comfortable shoes. These help protect your feet. ?Develop a good support system. Living with peripheral neuropathy can be stressful. Consider talking with a mental health specialist or joining a support group. ?Use assistive devices and attend physical therapy as told by your health care provider. This may include using a walker or a cane. ?Keep all follow-up visits as told by your health care provider. This is important. ?Contact a health care provider if: ?You have new signs or symptoms of peripheral neuropathy. ?You are struggling emotionally from dealing with peripheral neuropathy. ?Your pain is not well-controlled. ?Get help right away if: ?You have an injury or infection that is not healing normally. ?You develop new weakness in an arm or leg. ?You have fallen or do so frequently. ?Summary ?Peripheral neuropathy is when the nerves in the arms, or legs are damaged, resulting in numbness, weakness, or pain. ?There are many causes of peripheral neuropathy, including diabetes, pinched nerves, vitamin deficiencies, autoimmune disease, and hereditary conditions. ?Diagnosing and finding the cause of peripheral neuropathy can be difficult. Your health care provider will take your medical history, do a physical exam, and do tests, including blood tests and nerve function tests. ?Treatment involves treating the underlying cause of the neuropathy and taking medicines to help control pain. Physical therapy and assistive devices may also help. ?This information is not intended to  replace advice given to you by your health care provider. Make sure you discuss any questions you have with your health care provider. ?Document Revised: 11/18/2019 Document Reviewed: 11/18/2019 ?Elsevier Patient Education ? 2022 [employer] ? ?

## 2020-11-29 NOTE — Assessment & Plan Note (Signed)
Chronic, ongoing.  Continue current medication regimen and adjust as needed, recent LDL <70.  Check lipid panel today.

## 2020-11-30 ENCOUNTER — Other Ambulatory Visit: Payer: Self-pay | Admitting: Nurse Practitioner

## 2020-11-30 DIAGNOSIS — Z8546 Personal history of malignant neoplasm of prostate: Secondary | ICD-10-CM

## 2020-11-30 LAB — LIPID PANEL W/O CHOL/HDL RATIO
Cholesterol, Total: 151 mg/dL (ref 100–199)
HDL: 52 mg/dL (ref >39)
LDL Chol Calc (NIH): 76 mg/dL (ref 0–99)
Triglycerides: 130 mg/dL (ref 0–149)
VLDL Cholesterol Cal: 23 mg/dL (ref 5–40)

## 2020-11-30 LAB — COMPREHENSIVE METABOLIC PANEL
ALT: 12 IU/L (ref 0–44)
AST: 14 IU/L (ref 0–40)
Albumin/Globulin Ratio: 2.4 — ABNORMAL HIGH (ref 1.2–2.2)
Albumin: 4.7 g/dL (ref 3.7–4.7)
Alkaline Phosphatase: 58 IU/L (ref 44–121)
BUN/Creatinine Ratio: 25 — ABNORMAL HIGH (ref 10–24)
BUN: 16 mg/dL (ref 8–27)
Bilirubin Total: 0.4 mg/dL (ref 0.0–1.2)
CO2: 17 mmol/L — ABNORMAL LOW (ref 20–29)
Calcium: 9.5 mg/dL (ref 8.6–10.2)
Chloride: 102 mmol/L (ref 96–106)
Creatinine, Ser: 0.65 mg/dL — ABNORMAL LOW (ref 0.76–1.27)
Globulin, Total: 2 g/dL (ref 1.5–4.5)
Glucose: 186 mg/dL — ABNORMAL HIGH (ref 70–99)
Potassium: 4.2 mmol/L (ref 3.5–5.2)
Sodium: 139 mmol/L (ref 134–144)
Total Protein: 6.7 g/dL (ref 6.0–8.5)
eGFR: 99 mL/min/{1.73_m2} (ref >59)

## 2020-11-30 LAB — VITAMIN B12: Vitamin B-12: 813 pg/mL (ref 232–1245)

## 2020-11-30 LAB — TSH: TSH: 1.32 u[IU]/mL (ref 0.450–4.500)

## 2020-11-30 LAB — PSA: Prostate Specific Ag, Serum: 10.5 ng/mL — ABNORMAL HIGH (ref 0.0–4.0)

## 2020-11-30 NOTE — Progress Notes (Signed)
Contacted via MyChart  -- please schedule 4 week lab visit only   Good evening Tyler Morgan, your labs have returned and overall remain stable with exception of PSA testing which is elevated -- was recently normal just two months back.  I would like you to return in four weeks for lab only visit to recheck this -- if it remains elevated we will get you back in sooner with urology.  Please schedule lab only visit.  Continue all current medications.  Any questions? Keep being amazing!!  Thank you for allowing me to participate in your care.  I appreciate you. Kindest regards, Devontae Casasola

## 2020-12-01 ENCOUNTER — Other Ambulatory Visit: Payer: Self-pay | Admitting: Nurse Practitioner

## 2020-12-02 NOTE — Telephone Encounter (Signed)
Requested Prescriptions  Pending Prescriptions Disp Refills  . atorvastatin (LIPITOR) 20 MG tablet [Pharmacy Med Name: ATORVASTATIN CALCIUM 20 MG TAB] 90 tablet 3    Sig: TAKE 1 TABLET BY MOUTH DAILY     Cardiovascular:  Antilipid - Statins Passed - 12/01/2020 10:15 PM      Passed - Total Cholesterol in normal range and within 360 days    Cholesterol, Total  Date Value Ref Range Status  11/29/2020 151 100 - 199 mg/dL Final   Cholesterol Piccolo, Waived  Date Value Ref Range Status  07/30/2018 131 <200 mg/dL Final    Comment:                            Desirable                <200                         Borderline High      200- 239                         High                     >239          Passed - LDL in normal range and within 360 days    LDL Chol Calc (NIH)  Date Value Ref Range Status  11/29/2020 76 0 - 99 mg/dL Final         Passed - HDL in normal range and within 360 days    HDL  Date Value Ref Range Status  11/29/2020 52 >39 mg/dL Final         Passed - Triglycerides in normal range and within 360 days    Triglycerides  Date Value Ref Range Status  11/29/2020 130 0 - 149 mg/dL Final   Triglycerides Piccolo,Waived  Date Value Ref Range Status  07/30/2018 121 <150 mg/dL Final    Comment:                            Normal                   <150                         Borderline High     150 - 199                         High                200 - 499                         Very High                >499          Passed - Patient is not pregnant      Passed - Valid encounter within last 12 months    Recent Outpatient Visits          3 days ago Type 2 diabetes mellitus with diabetic neuropathy, without long-term current use of insulin ([place])   [place], Cobb T, NP   1 month ago COVID-19 virus RNA  test result positive at limit of detection   [facility] [place], Longfellow T, NP   2 months ago Hypertension associated  with diabetes ([place])   [place], Greenbush T, NP   3 months ago DM (diabetes mellitus), secondary, uncontrolled, w/neurologic complic ([place])   [place], Jolene T, NP   9 months ago DM (diabetes mellitus), secondary, uncontrolled, w/neurologic complic ([place])   [place], Jolene T, NP      Future Appointments            In 2 months Hollice Espy, MD [place]   In 6 months [place], Barbaraann Faster, NP [facility]   In 72 months  [employer] [facility], [place]

## 2020-12-22 ENCOUNTER — Other Ambulatory Visit: Payer: Self-pay

## 2020-12-22 ENCOUNTER — Other Ambulatory Visit: Payer: Medicare Other

## 2020-12-22 DIAGNOSIS — Z8546 Personal history of malignant neoplasm of prostate: Secondary | ICD-10-CM

## 2020-12-23 LAB — PSA: Prostate Specific Ag, Serum: 2.4 ng/mL (ref 0.0–4.0)

## 2020-12-23 NOTE — Progress Notes (Signed)
Contacted via MyChart   Good evening Tyler Morgan, your labs have returned and PSA is back to normal.  No further interventions needed.  Continue to follow with urology.  Any questions? Keep being stellar!!  Thank you for allowing me to participate in your care.  I appreciate you. Kindest regards, Meda Dudzinski

## 2020-12-28 NOTE — Progress Notes (Signed)
12/29/20 11:07 AM   Tyler Morgan 1946-01-10 [id number]  Referring provider:  Venita Lick, NP [address] 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   He is s/p of laser nucleation of massive prostate 01/2017 with incidental low risk prostate cancer returns today for surveillance.   A total of 114 g were resected.  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  He also has a personal history of elevated PSA initiaially post-op PSA was  0.9 08/2017 on finasteride.   As further evaluation for this, he underwent prostate MRI on 12/05/2018.  This revealed a 2.2 cm peripheral zone nodule at the left lateral and posterior mid apex which was read as of PI-RADS 5 lesion.  No evidence of extracapsular extension or neurovascular involvement.  No lymphadenopathy.  Estimated residual prostatic volume 74 cc.   He underwent cognitive fusion biopsy which demonstrated some prostatic asymmetry, left greater than right.  Several additional samples of the left area of interest were biopsied but combined with the usual 12 core biopsy.  No complications. Pathology was benign.   His most recent PSA as on 12/22/2020 was 2.4 with a previous PSA on 11/29/2020 of 10.5.   He reports that he is pleased with his urinary symptoms today.  He voids in a teenager with a good stream and adequate emptying.  He is on no medications.  Is very pleased with his results.  PSA trend:  Component Prostate Specific Ag, Serum  Latest Ref Rng & Units 0.0 - 4.0 ng/mL  10/11/2016 8.5 (H)  09/19/2017 0.9  03/28/2018 2.0  10/23/2018 4.8 (H)  11/20/2018 4.9 (H)  03/25/2019 4.8 (H)  07/07/2019 2.9   01/27/2020 2.6  08/03/2020 2.9  11/29/2020 10.5 (H)  12/22/2020 2.4       PMH: Past Medical History:  Diagnosis Date   Diabetes mellitus without complication ([place])    ED (erectile dysfunction)    Hypertension    Motion sickness    boats   Neuropathy    Prostate enlargement    Urinary retention     Surgical History: Past Surgical History:  Procedure Laterality Date   BASAL CELL CARCINOMA EXCISION     nose    CATARACT EXTRACTION Bilateral 1986, 2014   COLONOSCOPY WITH PROPOFOL N/A 03/27/2018   Procedure: COLONOSCOPY WITH BIOPSIES;  Surgeon: Lin Landsman, MD;  Location: [facility];  Service: Endoscopy;  Laterality: N/A;  Diabetic - oral meds   EYE SURGERY Bilateral [phone]   FRACTURE SURGERY Right    as a child/ right arm   HERNIA REPAIR Right 01-13-14   inguinal hernia   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 N/A 02/07/2017   Procedure: 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;  Surgeon: Hollice Espy, MD;  Location: [facility];  Service: Urology;  Laterality: N/A;   POLYPECTOMY N/A 03/27/2018   Procedure: POLYPECTOMY;  Surgeon: Lin Landsman, MD;  Location: [facility];  Service: Endoscopy;  Laterality: N/A;   RETINAL DETACHMENT SURGERY  Jan 2015   TONSILLECTOMY  1955    Home Medications:  Allergies as of 12/29/2020       Reactions  Acetaminophen Swelling, Other (See Comments)   Swelling of liver    Amlodipine Swelling        Medication List        Accurate as of December 29, 2020 11:07 AM. If you have any questions, ask your nurse or doctor.          atenolol 50 MG tablet Commonly known as: TENORMIN Take 1 tablet (50 mg total) by mouth daily.   atorvastatin 20 MG tablet Commonly known as: LIPITOR TAKE 1 TABLET BY MOUTH DAILY   benazepril 40 MG tablet Commonly known as: LOTENSIN Take 1 tablet (40 mg total) by mouth daily.   metFORMIN 500 MG tablet Commonly known as:  GLUCOPHAGE TAKE ONE TABLET BY MOUTH 3 TIMES DAILY   Shingrix injection Generic drug: Zoster Vaccine Adjuvanted Inject 0.5 mLs into the muscle once for 1 dose. Dose # 2 Start taking on: March 02, 2021   TURMERIC PO Take 1 capsule by mouth daily.   vitamin B-12 1000 MCG tablet Commonly known as: CYANOCOBALAMIN Take 1,000 mcg by mouth daily.   Vitamin D-3 25 MCG (1000 UT) Caps Take 1,000 Units by mouth daily.        Allergies:  Allergies  Allergen Reactions   Acetaminophen Swelling and Other (See Comments)    Swelling of liver    Amlodipine Swelling    Family History: Family History  Problem Relation Age of Onset   Colon cancer Father    Angina Father    Macular degeneration Father    Alzheimer's disease Mother    Prostate cancer Neg Hx    Bladder Cancer Neg Hx    Kidney cancer Neg Hx     Social History:  reports that he quit smoking about 36 years ago. His smoking use included cigarettes. He has a 13.00 pack-year smoking history. He has never used smokeless tobacco. He reports current alcohol use of about 2.0 standard drinks per week. He reports that he does not use drugs.   Physical Exam: BP (!) 145/87   Pulse 70   Ht 6\' 1"  (1.854 m)   Wt 176 lb (79.8 kg)   BMI 23.22 kg/m   Constitutional:  Alert and oriented, No acute distress. HEENT: [place] AT, moist mucus membranes.  Trachea midline, no masses. Cardiovascular: No clubbing, cyanosis, or edema. Respiratory: Normal respiratory effort, no increased work of breathing. Rectal: Normal sphincter tone,  50+  CC prostate, smooth no nodules Skin: No rashes, bruises or suspicious lesions. Neurologic: Grossly intact, no focal deficits, moving all 4 extremities. Psychiatric: Normal mood and affect. He presents today with a cane due to neuropathy    Laboratory Data:  Lab Results  Component Value Date   CREATININE 0.65 (L) 11/29/2020    Lab Results  Component Value Date   HGBA1C 6.1 (H) 11/29/2020     Assessment & Plan:   Prostate cancer  - Incidental low risk prostate cancer on active surveillance - PSA has trended down to 2.4 which is reassuring  -Recent abrupt rise to 10 but has returned back down to his baseline which is reassuring, likely inflammatory nature - Continue to follow with serial labs on a every 6 month basis with rectal exam annually  BPH w/o urinary symptoms  - Asymptomatic on no medication   Return in about 1 year (around 12/29/2021) for 35mo PSA only, 1year w/PSA prior &DRE.  Conley Rolls as a scribe for Hollice Espy, MD.,have documented all relevant documentation on the behalf of Hollice Espy, MD,as  directed by  Hollice Espy, MD while in the presence of Hollice Espy, MD.  I have reviewed the above documentation for accuracy and completeness, and I agree with the above.   Hollice Espy, MD  [facility] [address] [place], [place] [address] [phone]

## 2020-12-29 ENCOUNTER — Other Ambulatory Visit: Payer: Self-pay

## 2020-12-29 ENCOUNTER — Ambulatory Visit: Payer: Medicare Other | Admitting: Urology

## 2020-12-29 VITALS — BP 145/87 | HR 70 | Ht 73.0 in | Wt 176.0 lb

## 2020-12-29 DIAGNOSIS — C61 Malignant neoplasm of prostate: Secondary | ICD-10-CM

## 2020-12-30 ENCOUNTER — Other Ambulatory Visit: Payer: Medicare Other

## 2021-01-03 ENCOUNTER — Ambulatory Visit (INDEPENDENT_AMBULATORY_CARE_PROVIDER_SITE_OTHER): Payer: Medicare Other | Admitting: Nurse Practitioner

## 2021-01-03 ENCOUNTER — Other Ambulatory Visit: Payer: Self-pay

## 2021-01-03 ENCOUNTER — Encounter: Payer: Self-pay | Admitting: Nurse Practitioner

## 2021-01-03 VITALS — BP 142/78 | HR 89 | Temp 98.3°F | Wt 183.0 lb

## 2021-01-03 DIAGNOSIS — J0110 Acute frontal sinusitis, unspecified: Secondary | ICD-10-CM | POA: Diagnosis not present

## 2021-01-03 DIAGNOSIS — R051 Acute cough: Secondary | ICD-10-CM | POA: Diagnosis not present

## 2021-01-03 LAB — VERITOR FLU A/B WAIVED
Influenza A: NEGATIVE
Influenza B: NEGATIVE

## 2021-01-03 MED ORDER — AMOXICILLIN-POT CLAVULANATE 875-125 MG PO TABS: 1.0000 | ORAL_TABLET | Freq: Two times a day (BID) | ORAL | 0 refills | Status: DC

## 2021-01-03 NOTE — Patient Instructions (Addendum)
Drink plenty of fluids Get rest Start augmentin twice a day for 7 days Can use flonase (fluticasone) daily

## 2021-01-03 NOTE — Progress Notes (Signed)
Acute Office Visit  Subjective:    Patient ID: Tyler Morgan, male    DOB: [birth year], 75 y.o.   MRN: [id number]  Chief Complaint  Patient presents with   URI    Pt states he has been having a cough, congestion, sinus pressure, fever, and fatigue that started about 5 days ago    HPI Patient is in today for congestion and cough for since Wednesday. Home covid-19 test negative yesterday.   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, hospital Strep contacts: no  Context: worse Recurrent sinusitis: no Relief with OTC cold/cough medications: no  Treatments attempted: none and cough syrup    Past Medical History:  Diagnosis Date   Diabetes mellitus without complication ([facility])    ED (erectile dysfunction)    Hypertension    Motion sickness    boats   Neuropathy    Prostate enlargement    Urinary retention     Past Surgical History:  Procedure Laterality Date   BASAL CELL CARCINOMA EXCISION     nose    CATARACT EXTRACTION Bilateral 1986, 2014   COLONOSCOPY WITH PROPOFOL N/A 03/27/2018   Procedure: COLONOSCOPY WITH BIOPSIES;  Surgeon: Lin Landsman, MD;  Location: [facility];  Service: Endoscopy;  Laterality: N/A;  Diabetic - oral meds   EYE SURGERY Bilateral [phone]   FRACTURE SURGERY Right    as a child/ right arm   HERNIA REPAIR Right 01-13-14   inguinal hernia   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 N/A 02/07/2017   Procedure:  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;  Surgeon: Hollice Espy, MD;  Location: [facility];  Service: Urology;  Laterality: N/A;   POLYPECTOMY N/A 03/27/2018   Procedure: POLYPECTOMY;  Surgeon: Lin Landsman, MD;  Location: [facility];  Service: Endoscopy;  Laterality: N/A;   RETINAL DETACHMENT SURGERY  Jan 2015   TONSILLECTOMY  1955    Family History  Problem Relation Age of Onset   Colon cancer Father    Angina Father    Macular degeneration Father    Alzheimer's disease Mother    Prostate cancer Neg Hx    Bladder Cancer Neg Hx    Kidney cancer Neg Hx     Social History   Socioeconomic History   Marital status: Married    Spouse name: Not on file   Number of children: Not on file   Years of education: Not on file   Highest education level: Bachelor's degree (e.g., BA, AB, BS)  Occupational History   Not on file  Tobacco Use   Smoking status: Former    Packs/day: 1.00    Years: 13.00    Pack years: 13.00    Types: Cigarettes    Quit date: 02/21/1984    Years since quitting: 36.8   Smokeless tobacco: Never  Vaping Use   Vaping Use: Never used  Substance and Sexual Activity   Alcohol use: Yes    Alcohol/week: 2.0 standard drinks    Types: 2  Cans of beer per week    Comment: wine occasionally    Drug use: No   Sexual activity: Not on file  Other Topics Concern   Not on file  Social History Narrative   Working fulltime    Social Determinants of Radio broadcast assistant Strain: Low Risk    Difficulty of Paying Living Expenses: Not hard at all  Food Insecurity: No Food Insecurity   Worried About Charity fundraiser in the Last Year: Never true   Arboriculturist in the Last Year: Never true  Transportation Needs: No Transportation Needs   Lack of Transportation (Medical): No   Lack of Transportation (Non-Medical): No  Physical Activity: Inactive   Days of Exercise per Week: 0 days   Minutes of Exercise per Session: 0 min   Stress: No Stress Concern Present   Feeling of Stress : Not at all  Social Connections: Not on file  Intimate Partner Violence: Not on file    Outpatient Medications Prior to Visit  Medication Sig Dispense Refill   atenolol (TENORMIN) 50 MG tablet Take 1 tablet (50 mg total) by mouth daily. 90 tablet 4   atorvastatin (LIPITOR) 20 MG tablet TAKE 1 TABLET BY MOUTH DAILY 90 tablet 3   benazepril (LOTENSIN) 40 MG tablet Take 1 tablet (40 mg total) by mouth daily. 90 tablet 4   Cholecalciferol (VITAMIN D-3) 1000 units CAPS Take 1,000 Units by mouth daily.      metFORMIN (GLUCOPHAGE) 500 MG tablet TAKE ONE TABLET BY MOUTH 3 TIMES DAILY 270 tablet 4   TURMERIC PO Take 1 capsule by mouth daily.      vitamin B-12 (CYANOCOBALAMIN) 1000 MCG tablet Take 1,000 mcg by mouth daily.     [START ON 03/02/2021] Zoster Vaccine Adjuvanted [facility]) injection Inject 0.5 mLs into the muscle once for 1 dose. Dose # 2 0.5 mL 0   No facility-administered medications prior to visit.    Allergies  Allergen Reactions   Acetaminophen Swelling and Other (See Comments)    Swelling of liver    Amlodipine Swelling    Review of Systems  Constitutional:  Positive for fatigue and fever.  HENT:  Positive for congestion, sinus pressure and sinus pain. Negative for ear pain, postnasal drip and sore throat.   Eyes: Negative.   Respiratory:  Positive for cough. Negative for shortness of breath.   Cardiovascular: Negative.   Gastrointestinal: Negative.   Genitourinary: Negative.   Musculoskeletal: Negative.   Skin: Negative.   Neurological:  Positive for headaches. Negative for dizziness.      Objective:    Physical Exam Vitals and nursing note reviewed.  Constitutional:      Appearance: Normal appearance.  HENT:     Head: Normocephalic.     Right Ear: Tympanic membrane, ear canal and external ear normal.     Left Ear: Tympanic membrane, ear canal and external ear normal.     Mouth/Throat:     Mouth:  Mucous membranes are moist.     Pharynx: Posterior oropharyngeal erythema present.  Eyes:     Conjunctiva/sclera: Conjunctivae normal.  Cardiovascular:     Rate and Rhythm: Normal rate and regular rhythm.     Pulses: Normal pulses.     Heart sounds: Normal heart sounds.  Pulmonary:     Effort: Pulmonary effort is normal.     Breath sounds: Normal breath sounds.  Abdominal:     Palpations: Abdomen is soft.  Tenderness: There is no abdominal tenderness.  Musculoskeletal:     Cervical back: Normal range of motion.  Skin:    General: Skin is warm.  Neurological:     General: No focal deficit present.     Mental Status: He is alert and oriented to person, place, and time.  Psychiatric:        Mood and Affect: Mood normal.        Behavior: Behavior normal.        Thought Content: Thought content normal.        Judgment: Judgment normal.    BP (!) 142/78   Pulse 89   Temp 98.3 F (36.8 C) (Oral)   Wt 183 lb (83 kg)   SpO2 96%   BMI 24.14 kg/m  Wt Readings from Last 3 Encounters:  01/03/21 183 lb (83 kg)  12/29/20 176 lb (79.8 kg)  11/29/20 181 lb 12.8 oz (82.5 kg)    Health Maintenance Due  Topic Date Due   OPHTHALMOLOGY EXAM  03/04/2020   COVID-19 Vaccine (5 - Booster for Pfizer series) 09/17/2020    There are no preventive care reminders to display for this patient.   Lab Results  Component Value Date   TSH 1.320 11/29/2020   Lab Results  Component Value Date   WBC 5.5 11/28/2019   HGB 14.0 11/28/2019   HCT 40.9 11/28/2019   MCV 86 11/28/2019   PLT 236 11/28/2019   Lab Results  Component Value Date   NA 139 11/29/2020   K 4.2 11/29/2020   CO2 17 (L) 11/29/2020   GLUCOSE 186 (H) 11/29/2020   BUN 16 11/29/2020   CREATININE 0.65 (L) 11/29/2020   BILITOT 0.4 11/29/2020   ALKPHOS 58 11/29/2020   AST 14 11/29/2020   ALT 12 11/29/2020   PROT 6.7 11/29/2020   ALBUMIN 4.7 11/29/2020   CALCIUM 9.5 11/29/2020   ANIONGAP 11 01/30/2017   EGFR 99  11/29/2020   Lab Results  Component Value Date   CHOL 151 11/29/2020   Lab Results  Component Value Date   HDL 52 11/29/2020   Lab Results  Component Value Date   LDLCALC 76 11/29/2020   Lab Results  Component Value Date   TRIG 130 11/29/2020   Lab Results  Component Value Date   CHOLHDL 4.8 10/11/2016   Lab Results  Component Value Date   HGBA1C 6.1 (H) 11/29/2020       Assessment & Plan:   Problem List Items Addressed This Visit   None Visit Diagnoses     Acute cough    -  Primary   Flu negative. Covid test ordered and pending   Relevant Orders   Veritor Flu A/B Waived (Completed)   Novel Coronavirus, NAA ([facility])   Acute non-recurrent frontal sinusitis       Worsening symptoms and fever, will treat with augmentin. Increase fluids, rest. Can take flonase daily. Work note given.    Relevant Medications   amoxicillin-clavulanate (AUGMENTIN) 875-125 MG tablet        Meds ordered this encounter  Medications   amoxicillin-clavulanate (AUGMENTIN) 875-125 MG tablet    Sig: Take 1 tablet by mouth 2 (two) times daily.    Dispense:  14 tablet    Refill:  0     Charyl Dancer, NP

## 2021-01-05 LAB — NOVEL CORONAVIRUS, NAA: SARS-CoV-2, NAA: NOT DETECTED

## 2021-01-05 LAB — SARS-COV-2, NAA 2 DAY TAT

## 2021-01-31 ENCOUNTER — Other Ambulatory Visit: Payer: Self-pay | Admitting: Nurse Practitioner

## 2021-01-31 NOTE — Telephone Encounter (Signed)
Requested Prescriptions  Pending Prescriptions Disp Refills  . metFORMIN (GLUCOPHAGE) 500 MG tablet [Pharmacy Med Name: METFORMIN HCL 500 MG TAB] 270 tablet 1    Sig: TAKE 1 TABLET BY MOUTH 3 TIMES DAILY     Endocrinology:  Diabetes - Biguanides Failed - 01/31/2021  1:47 PM      Failed - Cr in normal range and within 360 days    Creatinine  Date Value Ref Range Status  01/05/2014 0.75 0.60 - 1.30 mg/dL Final   Creatinine, Ser  Date Value Ref Range Status  11/29/2020 0.65 (L) 0.76 - 1.27 mg/dL Final         Passed - HBA1C is between 0 and 7.9 and within 180 days    Hemoglobin A1C  Date Value Ref Range Status  07/21/2015 8.2  Final   HB A1C (BAYER DCA - WAIVED)  Date Value Ref Range Status  11/29/2020 6.1 (H) 4.8 - 5.6 % Final    Comment:             Prediabetes: 5.7 - 6.4          Diabetes: >6.4          Glycemic control for adults with diabetes: <7.0               **Please note reference interval change**          Passed - eGFR in normal range and within 360 days    EGFR (African American)  Date Value Ref Range Status  01/05/2014 >60 >69m/min Final   GFR calc Af Amer  Date Value Ref Range Status  03/04/2020 103 >59 mL/min/1.73 Final    Comment:    **In accordance with recommendations from the NKF-ASN Task force,**   [facility] is in the process of updating its eGFR calculation to the   2021 CKD-EPI creatinine equation that estimates kidney function   without a race variable.    EGFR (Non-African Amer.)  Date Value Ref Range Status  01/05/2014 >60 >618mmin Final    Comment:    eGFR values <6051min/1.73 m2 may be an indication of chronic kidney disease (CKD). Calculated eGFR, using the MRDR Study equation, is useful in  patients with stable renal function. The eGFR calculation will not be reliable in acutely ill patients when serum creatinine is changing rapidly. It is not useful in patients on dialysis. The eGFR calculation may not be applicable to patients  at the low and high extremes of body sizes, pregnant women, and vegetarians.    GFR calc non Af Amer  Date Value Ref Range Status  03/04/2020 89 >59 mL/min/1.73 Final   eGFR  Date Value Ref Range Status  11/29/2020 99 >59 mL/min/1.73 Final         Passed - Valid encounter within last 6 months    Recent Outpatient Visits          4 weeks ago Acute cough   [facility], [facility]   2 months ago Type 2 diabetes mellitus with diabetic neuropathy, without long-term current use of insulin ([facility] [place] [facility]   3 months ago COVID-19 virus RNA test result positive at limit of detection   [facility]   4 months ago Hypertension associated with diabetes ([facility] [place], [place]   5 months ago DM (diabetes mellitus), secondary, uncontrolled, w/neurologic complic ([facility]  Barbaraann Faster, NP      Future Appointments            In 4 months Cannady, Barbaraann Faster, NP [employer] [place]   In 9 months  [employer] [place]   In 83 months Hollice Espy, MD [facility]

## 2021-02-01 ENCOUNTER — Other Ambulatory Visit: Payer: Self-pay

## 2021-02-02 ENCOUNTER — Ambulatory Visit: Payer: Self-pay | Admitting: Urology

## 2021-05-17 DIAGNOSIS — S46109A Unspecified injury of muscle, fascia and tendon of long head of biceps, unspecified arm, initial encounter: Secondary | ICD-10-CM | POA: Insufficient documentation

## 2021-05-17 DIAGNOSIS — S46209A Unspecified injury of muscle, fascia and tendon of other parts of biceps, unspecified arm, initial encounter: Secondary | ICD-10-CM | POA: Insufficient documentation

## 2021-05-17 DIAGNOSIS — S46101A Unspecified injury of muscle, fascia and tendon of long head of biceps, right arm, initial encounter: Secondary | ICD-10-CM | POA: Diagnosis not present

## 2021-05-17 DIAGNOSIS — S46201A Unspecified injury of muscle, fascia and tendon of other parts of biceps, right arm, initial encounter: Secondary | ICD-10-CM | POA: Diagnosis not present

## 2021-05-28 NOTE — Patient Instructions (Signed)

## 2021-05-30 DIAGNOSIS — S46101A Unspecified injury of muscle, fascia and tendon of long head of biceps, right arm, initial encounter: Secondary | ICD-10-CM | POA: Diagnosis not present

## 2021-05-30 IMAGING — MR MR PROSTATE WO/W CM
56 series · 56 of 56 positions shown · IV contrast (gadavist)
Comparison: None.

CLINICAL DATA: Prostate carcinoma, Gleason score 3+3=6. Active
surveillance. Status post laser nucleation of prostate for massive
BPH.

EXAM:
MR PROSTATE WITHOUT AND WITH CONTRAST
TECHNIQUE: Multiplanar multisequence MRI images were obtained of the pelvis
centered about the prostate. Pre and post contrast images were
obtained.
CONTRAST:  7mL GADAVIST GADOBUTROL 1 MMOL/ML IV SOLN

[Series 3: T1 · axial · 8.0mm · 0.74mm/px · 1 of 25 slices shown (1 of 48)]
[im 1/25]
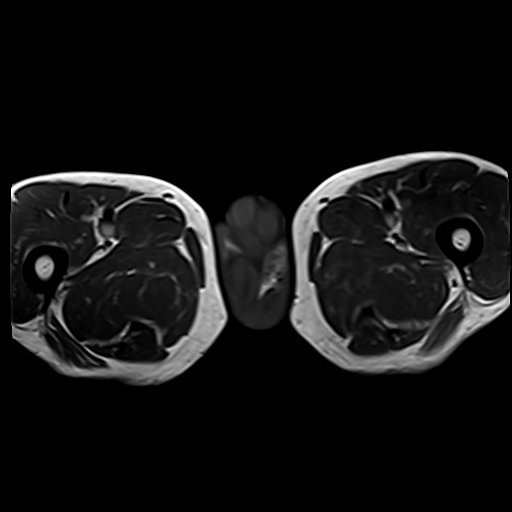

[Series 4: bSSFP fat-sat · axial · 8.0mm · 0.74mm/px · 1 of 25 slices shown]
[im 1/25]
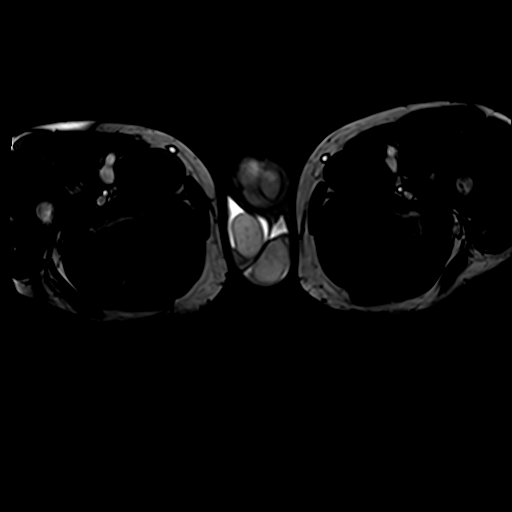

[Series 5: T2 · coronal · 3.0mm · 0.70mm/px · 1 of 30 slices shown (1 of 3)]
[im 1/30]
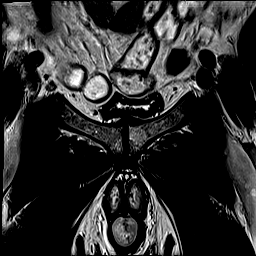

[Series 6: T2 · sagittal · 3.5mm · 0.62mm/px · 1 of 35 slices shown (2 of 3)]
[im 1/35]
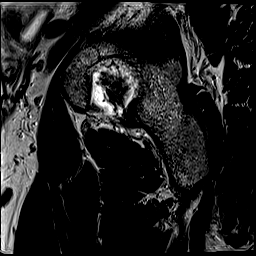

[Series 7: T1 · axial · 3.0mm · 0.35mm/px · 1 of 30 slices shown (2 of 48)]
[im 1/30]
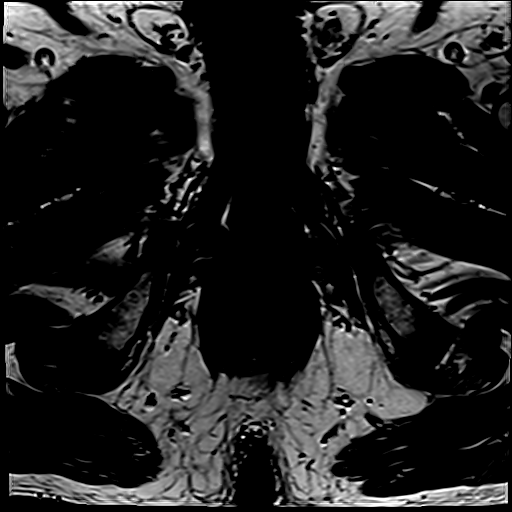

[Series 8: T2 · axial · 3.5mm · 0.56mm/px · 1 of 23 slices shown (3 of 3)]
[im 1/23]
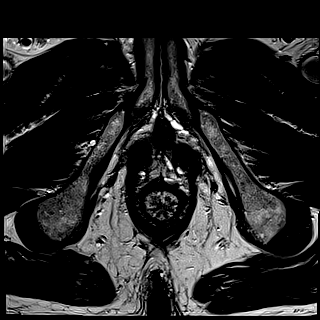

[Series 9: ax dwi_tracew · axial · 3.0mm · 0.78mm/px · 1 of 75 slices shown]
[im 1/75]
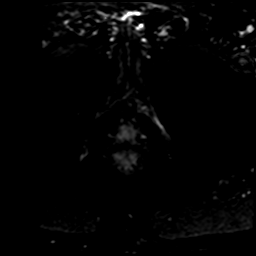

[Series 10: ax dwi_adc · axial · 3.0mm · 0.78mm/px · 1 of 25 slices shown]
[im 1/25]
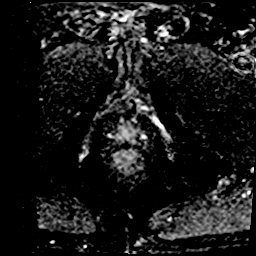

[Series 11: ax dwi_calc_bval · axial · 3.0mm · 0.78mm/px · 1 of 25 slices shown]
[im 1/25]
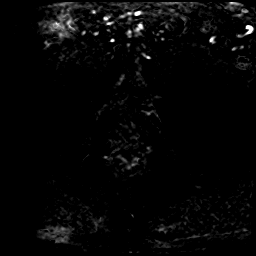

[Series 12: t2_space_[id number]_288 · axial · 1.0mm · 1.04mm/px · 1 of 72 slices shown]
[im 1/72]
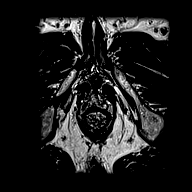

[Series 13: T1 · axial · 3.0mm · 1.15mm/px · 1 of 28 slices shown (3 of 48)]
[im 1/28]
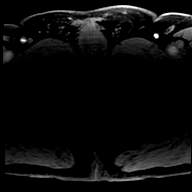

[Series 14: T1 · axial · 3.0mm · 1.15mm/px · 1 of 28 slices shown (4 of 48)]
[im 1/28]
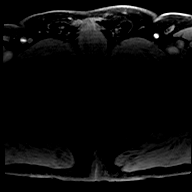

[Series 15: T1 · axial · 3.0mm · 1.15mm/px · 1 of 28 slices shown (5 of 48)]
[im 1/28]
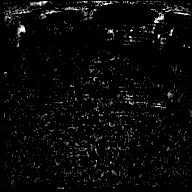

[Series 16: T1 · axial · 3.0mm · 1.15mm/px · 1 of 28 slices shown (6 of 48)]
[im 1/28]
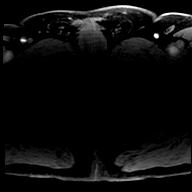

[Series 17: T1 · axial · 3.0mm · 1.15mm/px · 1 of 28 slices shown (7 of 48)]
[im 1/28]
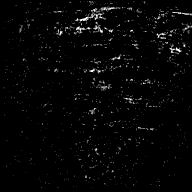

[Series 18: T1 · axial · 3.0mm · 1.15mm/px · 1 of 28 slices shown (8 of 48)]
[im 1/28]
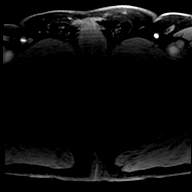

[Series 19: T1 · axial · 3.0mm · 1.15mm/px · 1 of 28 slices shown (9 of 48)]
[im 1/28]
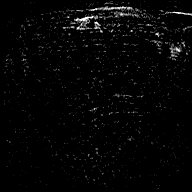

[Series 20: T1 · axial · 3.0mm · 1.15mm/px · 1 of 28 slices shown (10 of 48)]
[im 1/28]
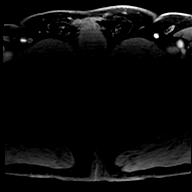

[Series 21: T1 · axial · 3.0mm · 1.15mm/px · 1 of 28 slices shown (11 of 48)]
[im 1/28]
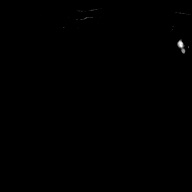

[Series 22: T1 · axial · 3.0mm · 1.15mm/px · 1 of 28 slices shown (12 of 48)]
[im 1/28]
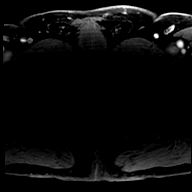

[Series 23: T1 · axial · 3.0mm · 1.15mm/px · 1 of 28 slices shown (13 of 48)]
[im 1/28]
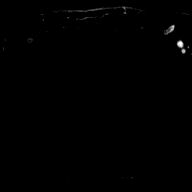

[Series 24: T1 · axial · 3.0mm · 1.15mm/px · 1 of 28 slices shown (14 of 48)]
[im 1/28]
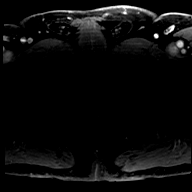

[Series 25: T1 · axial · 3.0mm · 1.15mm/px · 1 of 28 slices shown (15 of 48)]
[im 1/28]
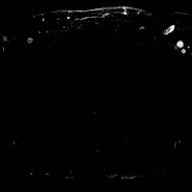

[Series 26: T1 · axial · 3.0mm · 1.15mm/px · 1 of 28 slices shown (16 of 48)]
[im 1/28]
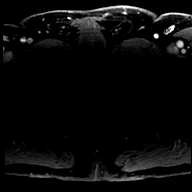

[Series 27: T1 · axial · 3.0mm · 1.15mm/px · 1 of 28 slices shown (17 of 48)]
[im 1/28]
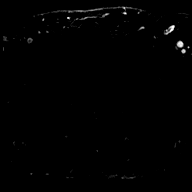

[Series 28: T1 · axial · 3.0mm · 1.15mm/px · 1 of 28 slices shown (18 of 48)]
[im 1/28]
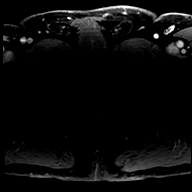

[Series 29: T1 · axial · 3.0mm · 1.15mm/px · 1 of 28 slices shown (19 of 48)]
[im 1/28]
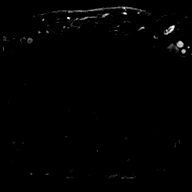

[Series 30: T1 · axial · 3.0mm · 1.15mm/px · 1 of 28 slices shown (20 of 48)]
[im 1/28]
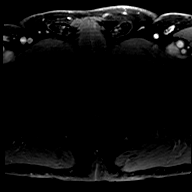

[Series 31: T1 · axial · 3.0mm · 1.15mm/px · 1 of 28 slices shown (21 of 48)]
[im 1/28]
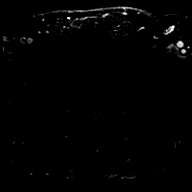

[Series 32: T1 · axial · 3.0mm · 1.15mm/px · 1 of 28 slices shown (22 of 48)]
[im 1/28]
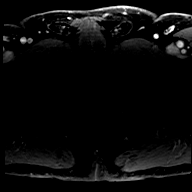

[Series 33: T1 · axial · 3.0mm · 1.15mm/px · 1 of 28 slices shown (23 of 48)]
[im 1/28]
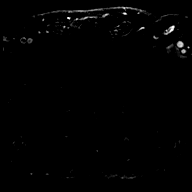

[Series 34: T1 · axial · 3.0mm · 1.15mm/px · 1 of 28 slices shown (24 of 48)]
[im 1/28]
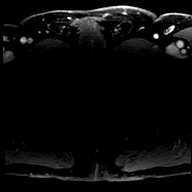

[Series 35: T1 · axial · 3.0mm · 1.15mm/px · 1 of 28 slices shown (25 of 48)]
[im 1/28]
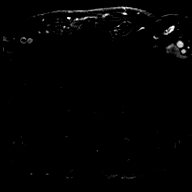

[Series 36: T1 · axial · 3.0mm · 1.15mm/px · 1 of 28 slices shown (26 of 48)]
[im 1/28]
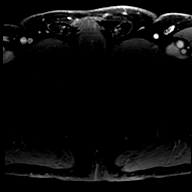

[Series 37: T1 · axial · 3.0mm · 1.15mm/px · 1 of 28 slices shown (27 of 48)]
[im 1/28]
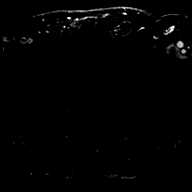

[Series 38: T1 · axial · 3.0mm · 1.15mm/px · 1 of 28 slices shown (28 of 48)]
[im 1/28]
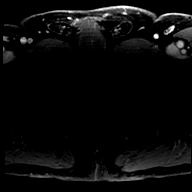

[Series 39: T1 · axial · 3.0mm · 1.15mm/px · 1 of 28 slices shown (29 of 48)]
[im 1/28]
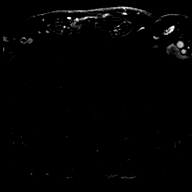

[Series 40: T1 · axial · 3.0mm · 1.15mm/px · 1 of 28 slices shown (30 of 48)]
[im 1/28]
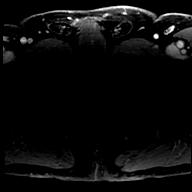

[Series 41: T1 · axial · 3.0mm · 1.15mm/px · 1 of 28 slices shown (31 of 48)]
[im 1/28]
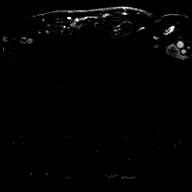

[Series 42: T1 · axial · 3.0mm · 1.15mm/px · 1 of 28 slices shown (32 of 48)]
[im 1/28]
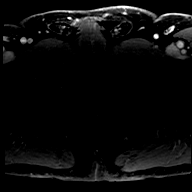

[Series 43: T1 · axial · 3.0mm · 1.15mm/px · 1 of 28 slices shown (33 of 48)]
[im 1/28]
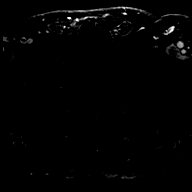

[Series 44: T1 · axial · 3.0mm · 1.15mm/px · 1 of 28 slices shown (34 of 48)]
[im 1/28]
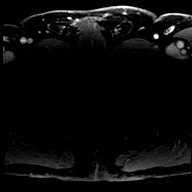

[Series 45: T1 · axial · 3.0mm · 1.15mm/px · 1 of 26 slices shown (35 of 48)]
[im 1/26]
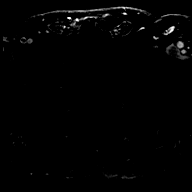

[Series 46: T1 · axial · 3.0mm · 1.15mm/px · 1 of 28 slices shown (36 of 48)]
[im 1/28]
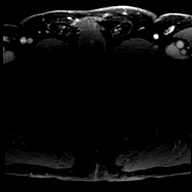

[Series 47: T1 · axial · 3.0mm · 1.15mm/px · 1 of 26 slices shown (37 of 48)]
[im 1/26]
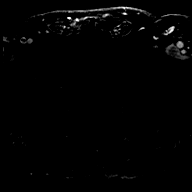

[Series 48: T1 · axial · 3.0mm · 1.15mm/px · 1 of 28 slices shown (38 of 48)]
[im 1/28]
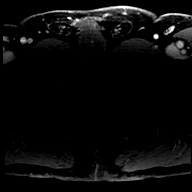

[Series 49: T1 · axial · 3.0mm · 1.15mm/px · 1 of 27 slices shown (39 of 48)]
[im 1/27]
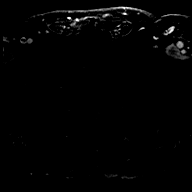

[Series 50: T1 · axial · 3.0mm · 1.15mm/px · 1 of 28 slices shown (40 of 48)]
[im 1/28]
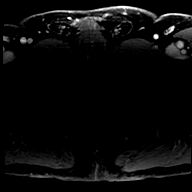

[Series 51: T1 · axial · 3.0mm · 1.15mm/px · 1 of 27 slices shown (41 of 48)]
[im 1/27]
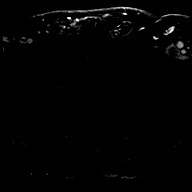

[Series 52: T1 · axial · 3.0mm · 1.15mm/px · 1 of 28 slices shown (42 of 48)]
[im 1/28]
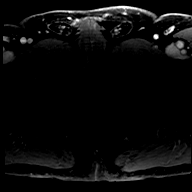

[Series 53: T1 · axial · 3.0mm · 1.15mm/px · 1 of 27 slices shown (43 of 48)]
[im 1/27]
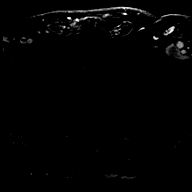

[Series 54: T1 · axial · 3.0mm · 1.15mm/px · 1 of 28 slices shown (44 of 48)]
[im 1/28]
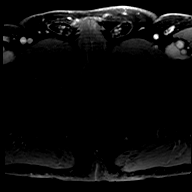

[Series 55: T1 · axial · 3.0mm · 1.15mm/px · 1 of 28 slices shown (45 of 48)]
[im 1/28]
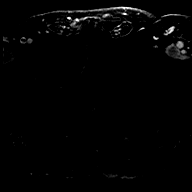

[Series 56: T1 · axial · 3.0mm · 1.15mm/px · 1 of 28 slices shown (46 of 48)]
[im 1/28]
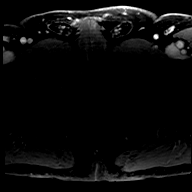

[Series 57: T1 · axial · 3.0mm · 1.15mm/px · 1 of 28 slices shown (47 of 48)]
[im 1/28]
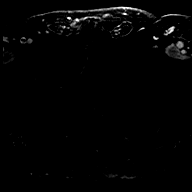

[Series 58: T1 · axial · 3.0mm · 1.15mm/px · 1 of 28 slices shown (48 of 48)]
[im 1/28]
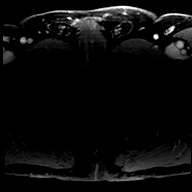

[56 of 56 positions shown; findings below may reference images not displayed]

FINDINGS: Prostate:

-- Peripheral Zone: A poorly defined nodule is seen in the left
lateral and posterior mid gland and apex. This nodule shows moderate
ADC hypointensity, DWI hyperintensity, and early focal contrast
enhancement. This nodule measures approximately 2.2 x 1.7 cm by
cm on images 56/[REDACTED].

-- Transition/Central Zone: Prior TURP defect. Circumscribed BPH
nodules noted, but no suspicious nodules with obscured or
non-circumscribed margins seen.

-- Measurements/Volume:  5.7 x 4.4 x 5.6 cm (volume = 74 cm^3)

Transcapsular spread: Although no gross extracapsular extension is
seen, the length of the nodule abutting the capsule resulting
increased risk for microscopic extracapsular extension

Seminal vesicle involvement:  Absent

Neurovascular bundle involvement:  Absent

Pelvic adenopathy: None visualized

Bone metastasis: None visualized

Other:  None
IMPRESSION: 2.2 cm peripheral zone nodule in the left lateral and posterior mid
gland and apex, highly suspicious for high-grade carcinoma. PI-RADS
5: Very high (clinically significant cancer is highly likely to be
present).

No gross extracapsular extension visualized, however increased risk
of microscopic extracapsular extension due to nodule size abutting
the capsule.

No evidence of neurovascular bundle involvement or other pelvic
metastatic disease.

## 2021-05-31 ENCOUNTER — Ambulatory Visit (INDEPENDENT_AMBULATORY_CARE_PROVIDER_SITE_OTHER): Payer: Medicare Other | Admitting: Nurse Practitioner

## 2021-05-31 ENCOUNTER — Other Ambulatory Visit: Payer: Self-pay | Admitting: Nurse Practitioner

## 2021-05-31 ENCOUNTER — Encounter: Payer: Self-pay | Admitting: Nurse Practitioner

## 2021-05-31 VITALS — BP 122/72 | HR 70 | Temp 97.7°F | Ht 73.0 in | Wt 185.0 lb

## 2021-05-31 DIAGNOSIS — E1140 Type 2 diabetes mellitus with diabetic neuropathy, unspecified: Secondary | ICD-10-CM

## 2021-05-31 DIAGNOSIS — E785 Hyperlipidemia, unspecified: Secondary | ICD-10-CM

## 2021-05-31 DIAGNOSIS — E1159 Type 2 diabetes mellitus with other circulatory complications: Secondary | ICD-10-CM | POA: Diagnosis not present

## 2021-05-31 DIAGNOSIS — I152 Hypertension secondary to endocrine disorders: Secondary | ICD-10-CM

## 2021-05-31 DIAGNOSIS — E1169 Type 2 diabetes mellitus with other specified complication: Secondary | ICD-10-CM | POA: Diagnosis not present

## 2021-05-31 DIAGNOSIS — S46102A Unspecified injury of muscle, fascia and tendon of long head of biceps, left arm, initial encounter: Secondary | ICD-10-CM

## 2021-05-31 DIAGNOSIS — Z8546 Personal history of malignant neoplasm of prostate: Secondary | ICD-10-CM

## 2021-05-31 LAB — MICROALBUMIN, URINE WAIVED
Creatinine, Urine Waived: 100 mg/dL (ref 10–300)
Microalb, Ur Waived: 10 mg/L (ref 0–19)
Microalb/Creat Ratio: <30 mg/g (ref <30)

## 2021-05-31 LAB — BAYER DCA HB A1C WAIVED: HB A1C (BAYER DCA - WAIVED): 6.2 % — ABNORMAL HIGH (ref 4.8–5.6)

## 2021-05-31 NOTE — Assessment & Plan Note (Signed)
Chronic, ongoing.  Continue current medication regimen and adjust as needed, recent LDL <70.  Check lipid panel today.  ?

## 2021-05-31 NOTE — Progress Notes (Signed)
? ?BP 122/72 (BP Location: Left Arm, Patient Position: Sitting, Cuff Size: Normal)   Pulse 70   Temp 97.7 ?F (36.5 ?C) (Oral)   Ht '6\' 1"'$  (1.854 m)   Wt 185 lb (83.9 kg)   SpO2 96%   BMI 24.41 kg/m?   ? ?Subjective:  ? ? Patient ID: Tyler Morgan, male    DOB: 10/01/1945, 76 y.o.   MRN: [id number] ? ?HPI: ?Tyler Morgan is a 76 y.o. male ? ?Chief Complaint  ?Patient presents with  ? Diabetes  ?  Patient declines having a recent Diabetic Eye Exam.   ? Hyperlipidemia  ? Hypertension  ? ?He is being followed by ortho at this time due to tear of bicep muscle left arm 2 to 2 1/2 weeks ago, seen at ortho at that time.   ? ?DIABETES ?Last A1c 6.1% in October.  Continues Metformin 500 MG TID, + focus on diet changes.  Was on Jardiance in past, on for less then a year.   ?Hypoglycemic episodes:no ?Polydipsia/polyuria: no ?Visual disturbance: no ?Chest pain: no ?Paresthesias: no ?Glucose Monitoring: no ? Accucheck frequency: Not Checking ? Fasting glucose: ? Post prandial: ? Evening: ? Before meals: ?Taking Insulin?: no ? Long acting insulin: ? Short acting insulin: ?Blood Pressure Monitoring: rarely ?Retinal Examination: Not up to Date -- needs to schedule, has tried calling [facility] ?Foot Exam: Up to Date ?Pneumovax: Up to Date ?Influenza: Up to Date ?Aspirin: no  ? ?HYPERTENSION / HYPERLIPIDEMIA ?Continues on Benazepril, Atenolol, + Lipitor.   ?Satisfied with current treatment? yes ?Duration of hypertension: chronic ?BP monitoring frequency: occasionally ?BP range: <130/80 at home, low 120's ?BP medication side effects: no ?Duration of hyperlipidemia: chronic ?Cholesterol medication side effects: no ?Cholesterol supplements: none ?Medication compliance: good compliance ?Aspirin: no ?Recent stressors: no ?Recurrent headaches: no ?Visual changes: no ?Palpitations: no ?Dyspnea: no ?Chest pain: no ?Lower extremity edema: no ?Dizzy/lightheaded: no  ? ?PROSTATE CANCER: ?Followed by Dr. Erlene Quan and last saw  08/04/20, was told he was cancer free in 2021.  Continues to have monitoring of PSA, every 6 months, recent November 2022 was 2.4. Denies any symptoms at this time.   ? ?Relevant past medical, surgical, family and social history reviewed and updated as indicated. Interim medical history since our last visit reviewed. ?Allergies and medications reviewed and updated. ? ?Review of Systems  ?Constitutional:  Negative for activity change, diaphoresis, fatigue and fever.  ?Respiratory:  Negative for cough, chest tightness, shortness of breath and wheezing.   ?Cardiovascular:  Negative for chest pain, palpitations and leg swelling.  ?Gastrointestinal: Negative.   ?Endocrine: Negative for cold intolerance, heat intolerance, polydipsia, polyphagia and polyuria.  ?Neurological: Negative.   ?Psychiatric/Behavioral: Negative.    ? ?Per HPI unless specifically indicated above ? ?   ?Objective:  ?  ?BP 122/72 (BP Location: Left Arm, Patient Position: Sitting, Cuff Size: Normal)   Pulse 70   Temp 97.7 ?F (36.5 ?C) (Oral)   Ht '6\' 1"'$  (1.854 m)   Wt 185 lb (83.9 kg)   SpO2 96%   BMI 24.41 kg/m?   ?Wt Readings from Last 3 Encounters:  ?05/31/21 185 lb (83.9 kg)  ?01/03/21 183 lb (83 kg)  ?12/29/20 176 lb (79.8 kg)  ?  ?Physical Exam ?Vitals and nursing note reviewed.  ?Constitutional:   ?   General: He is awake. He is not in acute distress. ?   Appearance: He is well-developed. He is not ill-appearing.  ?HENT:  ?   Head:  Normocephalic and atraumatic.  ?   Right Ear: Hearing normal. No drainage.  ?   Left Ear: Hearing normal. No drainage.  ?Eyes:  ?   General: Lids are normal.     ?   Right eye: No discharge.     ?   Left eye: No discharge.  ?   Conjunctiva/sclera: Conjunctivae normal.  ?   Pupils: Pupils are equal, round, and reactive to light.  ?Neck:  ?   Thyroid: No thyromegaly.  ?   Vascular: No carotid bruit.  ?Cardiovascular:  ?   Rate and Rhythm: Normal rate and regular rhythm.  ?   Heart sounds: Normal heart sounds, S1  normal and S2 normal. No murmur heard. ?  No gallop.  ?Pulmonary:  ?   Effort: Pulmonary effort is normal. No accessory muscle usage or respiratory distress.  ?   Breath sounds: Normal breath sounds.  ?Abdominal:  ?   General: Bowel sounds are normal.  ?   Palpations: Abdomen is soft.  ?Musculoskeletal:     ?   General: Normal range of motion.  ?   Cervical back: Normal range of motion and neck supple.  ?   Right lower leg: No edema.  ?   Left lower leg: No edema.  ?Skin: ?   General: Skin is warm and dry.  ?Neurological:  ?   Mental Status: He is alert and oriented to person, place, and time.  ?   Deep Tendon Reflexes: Reflexes are normal and symmetric.  ?Psychiatric:     ?   Attention and Perception: Attention normal.     ?   Mood and Affect: Mood normal.     ?   Speech: Speech normal.     ?   Behavior: Behavior normal. Behavior is cooperative.     ?   Thought Content: Thought content normal.  ? ?Results for orders placed or performed in visit on 05/31/21  ?Bayer DCA Hb A1c Waived  ?Result Value Ref Range  ? HB A1C (BAYER DCA - WAIVED) 6.2 (H) 4.8 - 5.6 %  ?Microalbumin, Urine Waived  ?Result Value Ref Range  ? Microalb, Ur Waived 10 0 - 19 mg/L  ? Creatinine, Urine Waived 100 10 - 300 mg/dL  ? Microalb/Creat Ratio <30 <30 mg/g  ? ?   ?Assessment & Plan:  ? ?Problem List Items Addressed This Visit   ? ?  ? Cardiovascular and Mediastinum  ? Hypertension associated with diabetes ([place])  ?  Chronic, stable with BP at goal.  Urine ALB 10 and A:C <19 June 2021.  Continue current medication regimen, Benazepril offering kidney protection.  Recommend checking BP a few mornings a week at home and documenting + focus on DASH diet.  Monitor for worsening edema and may provide PRN Lasix if presents.  Recommend avoid Ibuprofen products and high salt intake.  Return in 6 months.  Labs today: CMP. ?  ?  ? Relevant Orders  ? Bayer DCA Hb A1c Waived (Completed)  ? Microalbumin, Urine Waived (Completed)  ? Comprehensive metabolic  panel  ?  ? Endocrine  ? Hyperlipidemia associated with type 2 diabetes mellitus ([place])  ?  Chronic, ongoing.  Continue current medication regimen and adjust as needed, recent LDL <70.  Check lipid panel today.  ?  ?  ? Relevant Orders  ? Bayer DCA Hb A1c Waived (Completed)  ? Comprehensive metabolic panel  ? Lipid Panel w/o Chol/HDL Ratio  ? Type 2 diabetes mellitus with  diabetic neuropathy ([place]) - Primary  ?  Chronic, ongoing with A1c 6.2% today, urine ALB 10 and A:C <30 in April 2023.  Continue Metformin TID and diet focus -- may reduce this dosing at next visit if remains stable.  Recommend he check BS at least three mornings a week and document.  If elevation above goal A1c would consider increasing Metformin to max or adding SGLT.  Return in 6 months for A1c check. ?  ?  ? Relevant Orders  ? Bayer DCA Hb A1c Waived (Completed)  ? Microalbumin, Urine Waived (Completed)  ?  ? Musculoskeletal and Integument  ? Injury of tendon of long head of biceps  ?  Followed by ortho at this time, continue this collaboration.  ?  ?  ?  ? Other  ? History of prostate cancer  ?  Recent visit with Dr. Erlene Quan and is cancer free.  Continue collaboration with urology and PSA with them. ?  ?  ?  ? ?Follow up plan: ?Return in about 6 months (around 11/30/2021) for Annual physical and diabetes visit. ?

## 2021-05-31 NOTE — Assessment & Plan Note (Signed)
Chronic, ongoing with A1c 6.2% today, urine ALB 10 and A:C <30 in April 2023.  Continue Metformin TID and diet focus -- may reduce this dosing at next visit if remains stable.  Recommend he check BS at least three mornings a week and document.  If elevation above goal A1c would consider increasing Metformin to max or adding SGLT.  Return in 6 months for A1c check. ?

## 2021-05-31 NOTE — Assessment & Plan Note (Signed)
Recent visit with Dr. Brandon and is cancer free.  Continue collaboration with urology and PSA with them. 

## 2021-05-31 NOTE — Assessment & Plan Note (Signed)
Chronic, stable with BP at goal.  Urine ALB 10 and A:C <19 June 2021.  Continue current medication regimen, Benazepril offering kidney protection.  Recommend checking BP a few mornings a week at home and documenting + focus on DASH diet.  Monitor for worsening edema and may provide PRN Lasix if presents.  Recommend avoid Ibuprofen products and high salt intake.  Return in 6 months.  Labs today: CMP. ?

## 2021-05-31 NOTE — Assessment & Plan Note (Signed)
Followed by ortho at this time, continue this collaboration.  ?

## 2021-06-01 LAB — LIPID PANEL W/O CHOL/HDL RATIO
Cholesterol, Total: 222 mg/dL — ABNORMAL HIGH (ref 100–199)
HDL: 46 mg/dL (ref >39)
LDL Chol Calc (NIH): 139 mg/dL — ABNORMAL HIGH (ref 0–99)
Triglycerides: 208 mg/dL — ABNORMAL HIGH (ref 0–149)
VLDL Cholesterol Cal: 37 mg/dL (ref 5–40)

## 2021-06-01 LAB — COMPREHENSIVE METABOLIC PANEL
ALT: 18 IU/L (ref 0–44)
AST: 17 IU/L (ref 0–40)
Albumin/Globulin Ratio: 2.3 — ABNORMAL HIGH (ref 1.2–2.2)
Albumin: 4.6 g/dL (ref 3.7–4.7)
Alkaline Phosphatase: 61 IU/L (ref 44–121)
BUN/Creatinine Ratio: 26 — ABNORMAL HIGH (ref 10–24)
BUN: 19 mg/dL (ref 8–27)
Bilirubin Total: 0.5 mg/dL (ref 0.0–1.2)
CO2: 20 mmol/L (ref 20–29)
Calcium: 9.6 mg/dL (ref 8.6–10.2)
Chloride: 105 mmol/L (ref 96–106)
Creatinine, Ser: 0.74 mg/dL — ABNORMAL LOW (ref 0.76–1.27)
Globulin, Total: 2 g/dL (ref 1.5–4.5)
Glucose: 119 mg/dL — ABNORMAL HIGH (ref 70–99)
Potassium: 4.3 mmol/L (ref 3.5–5.2)
Sodium: 141 mmol/L (ref 134–144)
Total Protein: 6.6 g/dL (ref 6.0–8.5)
eGFR: 94 mL/min/{1.73_m2} (ref >59)

## 2021-06-01 NOTE — Telephone Encounter (Signed)
Requested medication (s) are due for refill today: yes ? ?Requested medication (s) are on the active medication list: yes ? ?Last refill:  01/31/21 #270/1 ? ?Future visit scheduled: yes ? ?Notes to clinic:  Unable to refill per protocol due to failed labs, no updated results. ? ? ?  ?Requested Prescriptions  ?Pending Prescriptions Disp Refills  ? metFORMIN (GLUCOPHAGE) 500 MG tablet [Pharmacy Med Name: METFORMIN HCL 500 MG TAB] 270 tablet 1  ?  Sig: TAKE 1 TABLET BY MOUTH 3 TIMES DAILY  ?  ? Endocrinology:  Diabetes - Biguanides Failed - 05/31/2021  9:06 PM  ?  ?  Failed - Cr in normal range and within 360 days  ?  Creatinine  ?Date Value Ref Range Status  ?01/05/2014 0.75 0.60 - 1.30 mg/dL Final  ? ?Creatinine, Ser  ?Date Value Ref Range Status  ?05/31/2021 0.74 (L) 0.76 - 1.27 mg/dL Final  ?  ?  ?  ?  Failed - CBC within normal limits and completed in the last 12 months  ?  WBC  ?Date Value Ref Range Status  ?11/28/2019 5.5 3.4 - 10.8 x10E3/uL Final  ?01/30/2017 5.3 3.8 - 10.6 K/uL Final  ? ?RBC  ?Date Value Ref Range Status  ?11/28/2019 4.75 4.14 - 5.80 x10E6/uL Final  ?01/30/2017 5.16 4.40 - 5.90 MIL/uL Final  ? ?Hemoglobin  ?Date Value Ref Range Status  ?11/28/2019 14.0 13.0 - 17.7 g/dL Final  ? ?Hematocrit  ?Date Value Ref Range Status  ?11/28/2019 40.9 37.5 - 51.0 % Final  ? ?MCHC  ?Date Value Ref Range Status  ?11/28/2019 34.2 31.5 - 35.7 g/dL Final  ?01/30/2017 33.7 32.0 - 36.0 g/dL Final  ? ?MCH  ?Date Value Ref Range Status  ?11/28/2019 29.5 26.6 - 33.0 pg Final  ?01/30/2017 29.0 26.0 - 34.0 pg Final  ? ?MCV  ?Date Value Ref Range Status  ?11/28/2019 86 79 - 97 fL Final  ?01/05/2014 86 80 - 100 fL Final  ? ?No results found for: PLTCOUNTKUC, LABPLAT, [place] ?RDW  ?Date Value Ref Range Status  ?11/28/2019 13.0 11.6 - 15.4 % Final  ?01/05/2014 13.4 11.5 - 14.5 % Final  ? ?  ?  ?  Passed - HBA1C is between 0 and 7.9 and within 180 days  ?  Hemoglobin A1C  ?Date Value Ref Range Status  ?07/21/2015 8.2  Final   ? ?HB A1C (BAYER DCA - WAIVED)  ?Date Value Ref Range Status  ?05/31/2021 6.2 (H) 4.8 - 5.6 % Final  ?  Comment:  ?           Prediabetes: 5.7 - 6.4 ?         Diabetes: >6.4 ?         Glycemic control for adults with diabetes: <7.0 ?  ?  ?  ?  ?  Passed - eGFR in normal range and within 360 days  ?  EGFR (African American)  ?Date Value Ref Range Status  ?01/05/2014 >60 >24m/min Final  ? ?GFR calc Af AWyvonnia Lora ?Date Value Ref Range Status  ?03/04/2020 103 >59 mL/min/1.73 Final  ?  Comment:  ?  **In accordance with recommendations from the NKF-ASN Task force,** ?  [facility] is in the process of updating its eGFR calculation to the ?  2021 CKD-EPI creatinine equation that estimates kidney function ?  without a race variable. ?  ? ?EGFR (Non-African Amer.)  ?Date Value Ref Range Status  ?01/05/2014 >60 >671mmin Final  ?  Comment:  ?  eGFR values <40m/min/1.73 m2 may be an indication of chronic ?kidney disease (CKD). ?Calculated eGFR, using the MRDR Study equation, is useful in  ?patients with stable renal function. ?The eGFR calculation will not be reliable in acutely ill patients ?when serum creatinine is changing rapidly. It is not useful in ?patients on dialysis. The eGFR calculation may not be applicable ?to patients at the low and high extremes of body sizes, pregnant ?women, and vegetarians. ?  ? ?GFR calc non Af Amer  ?Date Value Ref Range Status  ?03/04/2020 89 >59 mL/min/1.73 Final  ? ?eGFR  ?Date Value Ref Range Status  ?05/31/2021 94 >59 mL/min/1.73 Final  ?  ?  ?  ?  Passed - B12 Level in normal range and within 720 days  ?  Vitamin B-12  ?Date Value Ref Range Status  ?11/29/2020 813 232 - 1,245 pg/mL Final  ?  ?  ?  ?  Passed - Valid encounter within last 6 months  ?  Recent Outpatient Visits   ? ?      ? Yesterday Type 2 diabetes mellitus with diabetic neuropathy, without long-term current use of insulin ([place]  ? [place], NP  ? 4 months ago Acute cough  ? [place] NP  ? 6 months ago Type 2 diabetes mellitus with diabetic neuropathy, without long-term current use of insulin ([place]  ? [place] J[place]ampion [place], NP  ? 7 months ago COVID-19 virus RNA test result positive at limit of detection  ? [place] J[place], NP  ? 8 months ago Hypertension associated with diabetes ([place]  ? [facility] J[facility]rine ScrewsT, NP  ? ?  ?  ?Future Appointments   ? ?        ? In 5 months KRalene Bathe MD [place] ? [facility]  ? In 6 months [place], JBarbaraann Faster NP [facility]  ? In 7 months BHollice Espy MD [place] ? ?  ? ?  ?  ?  ? ?

## 2021-06-01 NOTE — Progress Notes (Signed)
Contacted via [place] ? ? ?Good morning Tyler Morgan, your labs have returned: ?- Kidney function, creatinine and eGFR, remains normal, as is liver function, AST and ALT.  ?- Cholesterol levels are more elevated this check.  I assume you were not fasting, correct?  Are you still taking Atorvastatin daily? Continue this and we will check fasting labs next visit.  Any questions? ?Keep being stellar!!  Thank you for allowing me to participate in your care.  I appreciate you. ?Kindest regards, ?Kenzi Bardwell ?

## 2021-06-27 ENCOUNTER — Other Ambulatory Visit: Payer: Medicare Other

## 2021-06-27 DIAGNOSIS — C61 Malignant neoplasm of prostate: Secondary | ICD-10-CM

## 2021-06-28 ENCOUNTER — Other Ambulatory Visit: Payer: Medicare Other

## 2021-06-28 LAB — PSA: Prostate Specific Ag, Serum: 2.8 ng/mL (ref 0.0–4.0)

## 2021-09-05 DIAGNOSIS — Z961 Presence of intraocular lens: Secondary | ICD-10-CM | POA: Diagnosis not present

## 2021-09-05 DIAGNOSIS — H33001 Unspecified retinal detachment with retinal break, right eye: Secondary | ICD-10-CM | POA: Diagnosis not present

## 2021-09-05 DIAGNOSIS — E119 Type 2 diabetes mellitus without complications: Secondary | ICD-10-CM | POA: Diagnosis not present

## 2021-09-05 LAB — HM DIABETES EYE EXAM

## 2021-09-30 ENCOUNTER — Other Ambulatory Visit: Payer: Self-pay | Admitting: Nurse Practitioner

## 2021-09-30 DIAGNOSIS — E1159 Type 2 diabetes mellitus with other circulatory complications: Secondary | ICD-10-CM

## 2021-09-30 NOTE — Telephone Encounter (Signed)
Requested Prescriptions  Pending Prescriptions Disp Refills  . atenolol (TENORMIN) 50 MG tablet [Pharmacy Med Name: ATENOLOL 50 MG TAB] 90 tablet 0    Sig: TAKE ONE TABLET BY MOUTH EVERY DAY     Cardiovascular: Beta Blockers 2 Failed - 09/30/2021  4:46 PM      Failed - Cr in normal range and within 360 days    Creatinine  Date Value Ref Range Status  01/05/2014 0.75 0.60 - 1.30 mg/dL Final   Creatinine, Ser  Date Value Ref Range Status  05/31/2021 0.74 (L) 0.76 - 1.27 mg/dL Final         Passed - Last BP in normal range    BP Readings from Last 1 Encounters:  05/31/21 122/72         Passed - Last Heart Rate in normal range    Pulse Readings from Last 1 Encounters:  05/31/21 70         Passed - Valid encounter within last 6 months    Recent Outpatient Visits          4 months ago Type 2 diabetes mellitus with diabetic neuropathy, without long-term current use of insulin ([place])   [place], [place] T, NP   9 months ago Acute cough   [place], Lauren A, NP   10 months ago Type 2 diabetes mellitus with diabetic neuropathy, without long-term current use of insulin ([place])   [place] Cannady, Jolene T, NP   11 months ago COVID-19 virus RNA test result positive at limit of detection   [facility] [place], Henrine Screws T, NP   1 year ago Hypertension associated with diabetes ([place])   [place], Jolene T, NP      Future Appointments            In 1 month Ralene Bathe, MD [place]   In 3 month  [place], [place]   In 2 months [place], Barbaraann Faster, NP [facility], [place]   In 3 months Hollice Espy, MD [facility]           . benazepril (LOTENSIN) 40 MG tablet [Pharmacy Med Name: BENAZEPRIL HCL 40 MG TAB] 90 tablet 0    Sig: TAKE ONE TABLET BY MOUTH EVERY DAY     Cardiovascular:  ACE Inhibitors Failed - 09/30/2021  4:46 PM       Failed - Cr in normal range and within 180 days    Creatinine  Date Value Ref Range Status  01/05/2014 0.75 0.60 - 1.30 mg/dL Final   Creatinine, Ser  Date Value Ref Range Status  05/31/2021 0.74 (L) 0.76 - 1.27 mg/dL Final         Passed - K in normal range and within 180 days    Potassium  Date Value Ref Range Status  05/31/2021 4.3 3.5 - 5.2 mmol/L Final  01/05/2014 4.0 3.5 - 5.1 mmol/L Final         Passed - Patient is not pregnant      Passed - Last BP in normal range    BP Readings from Last 1 Encounters:  05/31/21 122/72         Passed - Valid encounter within last 6 months    Recent Outpatient Visits          4 months ago Type 2 diabetes mellitus with diabetic neuropathy, without long-term current use of insulin ([place])   [facility] [place], [place] T, NP   9 months ago Acute cough   [place], Lauren A, NP   10 months ago Type 2 diabetes mellitus with diabetic neuropathy, without long-term current use of insulin ([place])   [place], [place] T, NP   11 months ago COVID-19 virus RNA test result positive at limit of detection   [facility] [place], Henrine Screws T, NP   1 year ago Hypertension associated with diabetes [facility])   [place], Jolene T, NP      Future Appointments            In 1 month Ralene Bathe, MD [place]   In 60 month  [place], [place]   In 2 months [place], Barbaraann Faster, NP [facility], [place]   In 3 months Hollice Espy, MD [place]

## 2021-10-31 ENCOUNTER — Ambulatory Visit: Payer: Medicare Other | Admitting: Dermatology

## 2021-11-14 ENCOUNTER — Ambulatory Visit (INDEPENDENT_AMBULATORY_CARE_PROVIDER_SITE_OTHER): Payer: Medicare Other

## 2021-11-14 DIAGNOSIS — Z0000 Encounter for general adult medical examination without abnormal findings: Secondary | ICD-10-CM | POA: Diagnosis not present

## 2021-11-14 NOTE — Patient Instructions (Signed)
Health Maintenance, Male Adopting a healthy lifestyle and getting preventive care are important in promoting health and wellness. Ask your health care provider about: The right schedule for you to have regular tests and exams. Things you can do on your own to prevent diseases and keep yourself healthy. What should I know about diet, weight, and exercise? Eat a healthy diet  Eat a diet that includes plenty of vegetables, fruits, low-fat dairy products, and lean protein. Do not eat a lot of foods that are high in solid fats, added sugars, or sodium. Maintain a healthy weight Body mass index (BMI) is a measurement that can be used to identify possible weight problems. It estimates body fat based on height and weight. Your health care provider can help determine your BMI and help you achieve or maintain a healthy weight. Get regular exercise Get regular exercise. This is one of the most important things you can do for your health. Most adults should: Exercise for at least 150 minutes each week. The exercise should increase your heart rate and make you sweat (moderate-intensity exercise). Do strengthening exercises at least twice a week. This is in addition to the moderate-intensity exercise. Spend less time sitting. Even light physical activity can be beneficial. Watch cholesterol and blood lipids Have your blood tested for lipids and cholesterol at 76 years of age, then have this test every 5 years. You may need to have your cholesterol levels checked more often if: Your lipid or cholesterol levels are high. You are older than 76 years of age. You are at high risk for heart disease. What should I know about cancer screening? Many types of cancers can be detected early and may often be prevented. Depending on your health history and family history, you may need to have cancer screening at various ages. This may include screening for: Colorectal cancer. Prostate cancer. Skin cancer. Lung  cancer. What should I know about heart disease, diabetes, and high blood pressure? Blood pressure and heart disease High blood pressure causes heart disease and increases the risk of stroke. This is more likely to develop in people who have high blood pressure readings or are overweight. Talk with your health care provider about your target blood pressure readings. Have your blood pressure checked: Every 3-5 years if you are 18-39 years of age. Every year if you are 40 years old or older. If you are between the ages of 65 and 75 and are a current or former smoker, ask your health care provider if you should have a one-time screening for abdominal aortic aneurysm (AAA). Diabetes Have regular diabetes screenings. This checks your fasting blood sugar level. Have the screening done: Once every three years after age 45 if you are at a normal weight and have a low risk for diabetes. More often and at a younger age if you are overweight or have a high risk for diabetes. What should I know about preventing infection? Hepatitis B If you have a higher risk for hepatitis B, you should be screened for this virus. Talk with your health care provider to find out if you are at risk for hepatitis B infection. Hepatitis C Blood testing is recommended for: Everyone born from 1945 through 1965. Anyone with known risk factors for hepatitis C. Sexually transmitted infections (STIs) You should be screened each year for STIs, including gonorrhea and chlamydia, if: You are sexually active and are younger than 76 years of age. You are older than 76 years of age and your   health care provider tells you that you are at risk for this type of infection. Your sexual activity has changed since you were last screened, and you are at increased risk for chlamydia or gonorrhea. Ask your health care provider if you are at risk. Ask your health care provider about whether you are at high risk for HIV. Your health care provider  may recommend a prescription medicine to help prevent HIV infection. If you choose to take medicine to prevent HIV, you should first get tested for HIV. You should then be tested every 3 months for as long as you are taking the medicine. Follow these instructions at home: Alcohol use Do not drink alcohol if your health care provider tells you not to drink. If you drink alcohol: Limit how much you have to 0-2 drinks a day. Know how much alcohol is in your drink. In the [place] one drink equals one 12 oz bottle of beer (355 mL), one 5 oz glass of wine (148 mL), or one 1 oz glass of hard liquor (44 mL). Lifestyle Do not use any products that contain nicotine or tobacco. These products include cigarettes, chewing tobacco, and vaping devices, such as e-cigarettes. If you need help quitting, ask your health care provider. Do not use street drugs. Do not share needles. Ask your health care provider for help if you need support or information about quitting drugs. General instructions Schedule regular health, dental, and eye exams. Stay current with your vaccines. Tell your health care provider if: You often feel depressed. You have ever been abused or do not feel safe at home. Summary Adopting a healthy lifestyle and getting preventive care are important in promoting health and wellness. Follow your health care provider's instructions about healthy diet, exercising, and getting tested or screened for diseases. Follow your health care provider's instructions on monitoring your cholesterol and blood pressure. This information is not intended to replace advice given to you by your health care provider. Make sure you discuss any questions you have with your health care provider. Document Revised: 06/28/2020 Document Reviewed: 06/28/2020 Elsevier Patient Education  2023 [employer]  

## 2021-11-14 NOTE — Progress Notes (Signed)
I connected with  [name] on 11/14/21 by a audio enabled telemedicine application and verified that I am speaking with the correct person using two identifiers.  Patient Location: Home  Provider Location: Office/Clinic  I discussed the limitations of evaluation and management by telemedicine. The patient expressed understanding and agreed to proceed.    Subjective:   Tyler Morgan is a 76 y.o. male who presents for Medicare Annual/Subsequent preventive examination.  Review of Systems    Defer to PCP.     Objective:    There were no vitals filed for this visit. There is no height or weight on file to calculate BMI.     11/12/2020   10:36 AM 11/10/2019   10:31 AM 09/23/2018    2:12 PM 03/27/2018    7:27 AM 09/19/2017   10:06 AM 02/07/2017   10:03 AM 01/30/2017    8:10 AM  Advanced Directives  Does Patient Have a Medical Advance Directive? No No Yes No No No No  Type of Advance Directive   Living will;Healthcare Power of Attorney      Copy of [place] in Chart?   No - copy requested      Would patient like information on creating a medical advance directive?    No - Patient declined Yes (MAU/Ambulatory/Procedural Areas - Information given) No - Patient declined     Current Medications (verified) Outpatient Encounter Medications as of 11/14/2021  Medication Sig   atenolol (TENORMIN) 50 MG tablet TAKE ONE TABLET BY MOUTH EVERY DAY   benazepril (LOTENSIN) 40 MG tablet TAKE ONE TABLET BY MOUTH EVERY DAY   metFORMIN (GLUCOPHAGE) 500 MG tablet TAKE 1 TABLET BY MOUTH 3 TIMES DAILY   TURMERIC PO Take 1 capsule by mouth daily.    vitamin B-12 (CYANOCOBALAMIN) 1000 MCG tablet Take 1,000 mcg by mouth daily.   VITAMIN D PO Take 500 Units by mouth daily.   [DISCONTINUED] Cholecalciferol (VITAMIN D-3) 1000 units CAPS Take 1,000 Units by mouth daily.    atorvastatin (LIPITOR) 20 MG tablet TAKE 1 TABLET BY MOUTH DAILY (Patient not taking: Reported on  11/14/2021)   No facility-administered encounter medications on file as of 11/14/2021.    Allergies (verified) Acetaminophen and Amlodipine   History: Past Medical History:  Diagnosis Date   Diabetes mellitus without complication [facility])    ED (erectile dysfunction)    Hypertension    Motion sickness    boats   Neuropathy    Prostate enlargement    Urinary retention    Past Surgical History:  Procedure Laterality Date   BASAL CELL CARCINOMA EXCISION     nose    CATARACT EXTRACTION Bilateral 1986, 2014   COLONOSCOPY WITH PROPOFOL N/A 03/27/2018   Procedure: COLONOSCOPY WITH BIOPSIES;  Surgeon: Lin Landsman, MD;  Location: [place];  Service: Endoscopy;  Laterality: N/A;  Diabetic - oral meds   EYE SURGERY Bilateral [id number]   FRACTURE SURGERY Right    as a child/ right arm   HERNIA REPAIR Right 01-13-14   inguinal hernia   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 N/A 02/07/2017   Procedure: 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;  Surgeon: Hollice Espy, MD;  Location: [facility];  Service: Urology;  Laterality: N/A;   POLYPECTOMY N/A 03/27/2018   Procedure: POLYPECTOMY;  Surgeon: Lin Landsman, MD;  Location: [place];  Service: Endoscopy;  Laterality: N/A;   RETINAL DETACHMENT SURGERY  Jan 2015  TONSILLECTOMY  1955   Family History  Problem Relation Age of Onset   Colon cancer Father    Angina Father    Macular degeneration Father    Alzheimer's disease Mother    Prostate cancer Neg Hx    Bladder Cancer Neg Hx    Kidney cancer Neg Hx    Social History   Socioeconomic History   Marital status: Married    Spouse name: Not on file   Number of children: Not on file   Years of education: Not on file   Highest education level: Bachelor's degree (e.g., BA, AB, BS)  Occupational History   Not on file  Tobacco Use   Smoking status: Former    Packs/day: 1.00    Years: 13.00    Total pack years:  13.00    Types: Cigarettes    Quit date: 02/21/1984    Years since quitting: 37.7   Smokeless tobacco: Never  Vaping Use   Vaping Use: Never used  Substance and Sexual Activity   Alcohol use: Yes    Alcohol/week: 2.0 standard drinks of alcohol    Types: 2 Cans of beer per week    Comment: wine occasionally    Drug use: No   Sexual activity: Not on file  Other Topics Concern   Not on file  Social History Narrative   Working fulltime    Social Determinants of Health   Financial Resource Strain: Low Risk  (11/14/2021)   Overall Financial Resource Strain (CARDIA)    Difficulty of Paying Living Expenses: Not hard at all  Food Insecurity: No Food Insecurity (11/14/2021)   Hunger Vital Sign    Worried About Running Out of Food in the Last Year: Never true    [place] in the Last Year: Never true  Transportation Needs: No Transportation Needs (11/14/2021)   PRAPARE - Hydrologist (Medical): No    Lack of Transportation (Non-Medical): No  Physical Activity: Sufficiently Active (11/14/2021)   Exercise Vital Sign    Days of Exercise per Week: 7 days    Minutes of Exercise per Session: 30 min  Stress: No Stress Concern Present (11/14/2021)   [place]    Feeling of Stress : Not at all  Social Connections: Moderately Isolated (11/14/2021)   Social Connection and Isolation Panel [NHANES]    Frequency of Communication with Friends and Family: More than three times a week    Frequency of Social Gatherings with Friends and Family: Twice a week    Attends Religious Services: Never    Marine scientist or Organizations: No    Attends Music therapist: Never    Marital Status: Married    Tobacco Counseling Counseling given: Not Answered   Clinical Intake:  Pre-visit preparation completed: Yes  Pain : No/denies pain     Diabetes: Yes CBG done?: No Did pt. bring in  CBG monitor from home?: No  How often do you need to have someone help you when you read instructions, pamphlets, or other written materials from your doctor or pharmacy?: 1 - Never What is the last grade level you completed in school?: Bachelor's Degree  Diabetic- Yes  Interpreter Needed?: No      Activities of Daily Living    11/14/2021    9:44 AM 11/29/2020    4:10 PM  In your present state of health, do you have any difficulty performing  the following activities:  Hearing? 0 0  Vision? 0 0  Difficulty concentrating or making decisions? 0 0  Walking or climbing stairs? 0 0  Dressing or bathing? 0 0  Doing errands, shopping? 0 0    Patient Care Team: Venita Lick, NP as PCP - General (Nurse Practitioner) Christene Lye, MD (General Surgery) Schnier, Dolores Lory, MD (Vascular Surgery)  Indicate any recent Medical Services you may have received from other than [facility] providers in the past year (date may be approximate).     Assessment:   This is a routine wellness examination for [place].  Hearing/Vision screen No results found.  Dietary issues and exercise activities discussed:     Goals Addressed   None   Depression Screen    11/14/2021    9:40 AM 05/31/2021    3:41 PM 11/29/2020    3:48 PM 11/12/2020   10:38 AM 03/04/2020   10:58 AM 11/10/2019   10:33 AM 09/23/2018    2:11 PM  PHQ 2/9 Scores  PHQ - 2 Score 0 0 0 0 0 0 0  PHQ- 9 Score 0 0         Fall Risk    11/14/2021    9:43 AM 05/31/2021    3:41 PM 11/29/2020    4:10 PM 11/12/2020   10:37 AM 03/04/2020   10:58 AM  Fall Risk   Falls in the past year? 0 1 0 0 0  Number falls in past yr: 0 1 0    Injury with Fall? 0 0 0    Risk for fall due to : No Fall Risks History of fall(s) No Fall Risks Medication side effect   Follow up Falls evaluation completed Falls evaluation completed Falls evaluation completed Falls evaluation completed;Education provided;Falls prevention discussed     FALL RISK  PREVENTION PERTAINING TO THE HOME:  Any stairs in or around the home? Yes  If so, are there any without handrails? No  Home free of loose throw rugs in walkways, pet beds, electrical cords, etc? Yes  Adequate lighting in your home to reduce risk of falls? Yes   ASSISTIVE DEVICES UTILIZED TO PREVENT FALLS:  Life alert? No  Use of a cane, walker or w/c? Yes  Grab bars in the bathroom? Yes  Shower chair or bench in shower? No  Elevated toilet seat or a handicapped toilet? Yes   TIMED UP AND GO:  Was the test performed? No .   Cognitive Function:        11/14/2021    9:46 AM 11/12/2020   10:39 AM 11/10/2019   10:36 AM 09/19/2017   10:07 AM 09/14/2016    8:35 AM  6CIT Screen  What Year? 0 points 0 points 0 points 0 points 0 points  What month? 0 points 0 points 0 points 0 points 0 points  What time? 0 points 0 points 0 points 0 points 0 points  Count back from 20 0 points 0 points 0 points 0 points 0 points  Months in reverse 0 points 0 points 0 points 0 points 0 points  Repeat phrase 0 points 2 points 2 points 0 points 2 points  Total Score 0 points 2 points 2 points 0 points 2 points    Immunizations Immunization History  Administered Date(s) Administered   Fluad Quad(high Dose 65+) 11/28/2019, 11/29/2020   Influenza, High Dose Seasonal PF 11/09/2015, 12/15/2016, 12/13/2017, 11/06/2018   Influenza,inj,Quad PF,6+ Mos 12/15/2014   Influenza-Unspecified 12/19/2016   PFIZER(Purple  Top)SARS-COV-2 Vaccination 04/01/2019, 04/22/2019, 10/21/2019, 07/23/2020, 02/18/2021   Pneumococcal Conjugate-13 05/13/2014   Pneumococcal Polysaccharide-23 07/21/2015   Pneumococcal-Unspecified 01/09/2008   Td 09/01/2020   Tdap 09/02/2009   Zoster Recombinat (Shingrix) 09/14/2020, 12/14/2020   Zoster, Live 01/09/2008    TDAP status: Up to date  Flu Vaccine status: Due, Education has been provided regarding the importance of this vaccine. Advised may receive this vaccine at local pharmacy or  Health Dept. Aware to provide a copy of the vaccination record if obtained from local pharmacy or Health Dept. Verbalized acceptance and understanding.  Pneumococcal vaccine status: Up to date  Covid-19 vaccine status: Completed vaccines  Qualifies for Shingles Vaccine? Yes   Zostavax completed Yes   Shingrix Completed?: Yes  Screening Tests Health Maintenance  Topic Date Due   COVID-19 Vaccine (6 - Pfizer risk series) 04/15/2021   INFLUENZA VACCINE  09/20/2021   FOOT EXAM  11/29/2021   HEMOGLOBIN A1C  11/30/2021   Diabetic kidney evaluation - GFR measurement  06/01/2022   Diabetic kidney evaluation - Urine ACR  06/01/2022   OPHTHALMOLOGY EXAM  09/06/2022   COLONOSCOPY (Pts 45-83yr Insurance coverage will need to be confirmed)  03/28/2023   TETANUS/TDAP  09/02/2030   Pneumonia Vaccine 76 Years old  Completed   Hepatitis C Screening  Completed   Zoster Vaccines- Shingrix  Completed   HPV VACCINES  Aged Out    Health Maintenance  Health Maintenance Due  Topic Date Due   COVID-19 Vaccine (6 - Pfizer risk series) 04/15/2021   INFLUENZA VACCINE  09/20/2021    Colorectal cancer screening: Type of screening: Colonoscopy. Completed 03/27/18. Repeat every 5 years  Lung Cancer Screening: (Low Dose CT Chest recommended if Age 76-80years, 30 pack-year currently smoking OR have quit w/in 15years.) does not qualify.   Lung Cancer Screening Referral: N/A  Additional Screening:  Hepatitis C Screening: does qualify; Completed 07/21/15  Vision Screening: Recommended annual ophthalmology exams for early detection of glaucoma and other disorders of the eye. Is the patient up to date with their annual eye exam?  Yes  Who is the provider or what is the name of the office in which the patient attends annual [facility] exams[facility] not [facility] a provider, would they like to be referred to a provider to establish care? No .   Dental Screening: Recommended annual  dental exams for proper oral hygiene  Community Resource Referral / Chronic Care Management: CRR required this visit?  No   CCM required this visit?  No      Plan:     I have personally reviewed and noted the following in the patient's chart:   Medical and social history Use of alcohol, tobacco or illicit drugs  Current medications and supplements including opioid prescriptions. Patient is not currently taking opioid prescriptions. Functional ability and status Nutritional status Physical activity Advanced directives List of other physicians Hospitalizations, surgeries, and ER visits in previous 12 months Vitals Screenings to include cognitive, depression, and falls Referrals and appointments  In addition, I have reviewed and discussed with patient certain preventive protocols, quality metrics, and best practice recommendations. A written personalized care plan for preventive services as well as general preventive health recommendations were provided to patient.     BGeorgina Peer [place]  11/14/2021   Nurse Notes: Non Face to Face 30 minutes.     Mr. RElting, Thank you for taking time to come for your Medicare Wellness Visit.  I appreciate your ongoing commitment to your health goals. Please review the following plan we discussed and let me know if I can assist you in the future.   These are the goals we discussed:  Goals      DIET - INCREASE WATER INTAKE     Recommend drinking at least 6-8 glasses of water a day      Increase water intake     Recommend drinking at least 5-6 glasses of water a day      Patient Stated     11/10/2019, wants to walk more     Patient Stated     11/12/2020, no goals        This is a list of the screening recommended for you and due dates:  Health Maintenance  Topic Date Due   COVID-19 Vaccine (6 - Pfizer risk series) 04/15/2021   Flu Shot  09/20/2021   Complete foot exam   11/29/2021   Hemoglobin A1C  11/30/2021   Yearly  kidney function blood test for diabetes  06/01/2022   Yearly kidney health urinalysis for diabetes  06/01/2022   Eye exam for diabetics  09/06/2022   Colon Cancer Screening  03/28/2023   Tetanus Vaccine  09/02/2030   Pneumonia Vaccine  Completed   Hepatitis C Screening: USPSTF Recommendation to screen - Ages 55-79 yo.  Completed   Zoster (Shingles) Vaccine  Completed   HPV Vaccine  Aged Out

## 2021-11-27 NOTE — Patient Instructions (Signed)
Diabetes Mellitus Basics  Diabetes mellitus, or diabetes, is a long-term (chronic) disease. It occurs when the body does not properly use sugar (glucose) that is released from food after you eat. Diabetes mellitus may be caused by one or both of these problems: Your pancreas does not make enough of a hormone called insulin. Your body does not react in a normal way to the insulin that it makes. Insulin lets glucose enter cells in your body. This gives you energy. If you have diabetes, glucose cannot get into cells. This causes high blood glucose (hyperglycemia). How to treat and manage diabetes You may need to take insulin or other diabetes medicines daily to keep your glucose in balance. If you are prescribed insulin, you will learn how to give yourself insulin by injection. You may need to adjust the amount of insulin you take based on the foods that you eat. You will need to check your blood glucose levels using a glucose monitor as told by your health care provider. The readings can help determine if you have low or high blood glucose. Generally, you should have these blood glucose levels: Before meals (preprandial): 80-130 mg/dL (4.4-7.2 mmol/L). After meals (postprandial): below 180 mg/dL (10 mmol/L). Hemoglobin A1c (HbA1c) level: less than 7%. Your health care provider will set treatment goals for you. Keep all follow-up visits. This is important. Follow these instructions at home: Diabetes medicines Take your diabetes medicines every day as told by your health care provider. List your diabetes medicines here: Name of medicine: ______________________________ Amount (dose): _______________ Time (a.m./p.m.): _______________ Notes: ___________________________________ Name of medicine: ______________________________ Amount (dose): _______________ Time (a.m./p.m.): _______________ Notes: ___________________________________ Name of medicine: ______________________________ Amount (dose):  _______________ Time (a.m./p.m.): _______________ Notes: ___________________________________ Insulin If you use insulin, list the types of insulin you use here: Insulin type: ______________________________ Amount (dose): _______________ Time (a.m./p.m.): _______________Notes: ___________________________________ Insulin type: ______________________________ Amount (dose): _______________ Time (a.m./p.m.): _______________ Notes: ___________________________________ Insulin type: ______________________________ Amount (dose): _______________ Time (a.m./p.m.): _______________ Notes: ___________________________________ Insulin type: ______________________________ Amount (dose): _______________ Time (a.m./p.m.): _______________ Notes: ___________________________________ Insulin type: ______________________________ Amount (dose): _______________ Time (a.m./p.m.): _______________ Notes: ___________________________________ Managing blood glucose  Check your blood glucose levels using a glucose monitor as told by your health care provider. Write down the times that you check your glucose levels here: Time: _______________ Notes: ___________________________________ Time: _______________ Notes: ___________________________________ Time: _______________ Notes: ___________________________________ Time: _______________ Notes: ___________________________________ Time: _______________ Notes: ___________________________________ Time: _______________ Notes: ___________________________________  Low blood glucose Low blood glucose (hypoglycemia) is when glucose is at or below 70 mg/dL (3.9 mmol/L). Symptoms may include: Feeling: Hungry. Sweaty and clammy. Irritable or easily upset. Dizzy. Sleepy. Having: A fast heartbeat. A headache. A change in your vision. Numbness around the mouth, lips, or tongue. Having trouble with: Moving (coordination). Sleeping. Treating low blood glucose To treat low blood  glucose, eat or drink something containing sugar right away. If you can think clearly and swallow safely, follow the 15:15 rule: Take 15 grams of a fast-acting carb (carbohydrate), as told by your health care provider. Some fast-acting carbs are: Glucose tablets: take 3-4 tablets. Hard candy: eat 3-5 pieces. Fruit juice: drink 4 oz (120 mL). Regular (not diet) soda: drink 4-6 oz (120-180 mL). Honey or sugar: eat 1 Tbsp (15 mL). Check your blood glucose levels 15 minutes after you take the carb. If your glucose is still at or below 70 mg/dL (3.9 mmol/L), take 15 grams of a carb again. If your glucose does not go above 70 mg/dL (3.9 mmol/L) after   3 tries, get help right away. After your glucose goes back to normal, eat a meal or a snack within 1 hour. Treating very low blood glucose If your glucose is at or below 54 mg/dL (3 mmol/L), you have very low blood glucose (severe hypoglycemia). This is an emergency. Do not wait to see if the symptoms will go away. Get medical help right away. Call your local emergency services (911 in the [place] Do not drive yourself to the hospital. Questions to ask your health care provider Should I talk with a diabetes educator? What equipment will I need to care for myself at home? What diabetes medicines do I need? When should I take them? How often do I need to check my blood glucose levels? What number can I call if I have questions? When is my follow-up visit? Where can I find a support group for people with diabetes? Where to find more information American Diabetes Association: www.diabetes.org Association of Diabetes Care and Education Specialists: www.diabeteseducator.org Contact a health care provider if: Your blood glucose is at or above 240 mg/dL (13.3 mmol/L) for 2 days in a row. You have been sick or have had a fever for 2 days or more, and you are not getting better. You have any of these problems for more than 6 hours: You cannot eat or  drink. You feel nauseous. You vomit. You have diarrhea. Get help right away if: Your blood glucose is lower than 54 mg/dL (3 mmol/L). You get confused. You have trouble thinking clearly. You have trouble breathing. These symptoms may represent a serious problem that is an emergency. Do not wait to see if the symptoms will go away. Get medical help right away. Call your local emergency services (911 in the [place] Do not drive yourself to the hospital. Summary Diabetes mellitus is a chronic disease that occurs when the body does not properly use sugar (glucose) that is released from food after you eat. Take insulin and diabetes medicines as told. Check your blood glucose every day, as often as told. Keep all follow-up visits. This is important. This information is not intended to replace advice given to you by your health care provider. Make sure you discuss any questions you have with your health care provider. Document Revised: 06/10/2019 Document Reviewed: 06/10/2019 Elsevier Patient Education  2023 Elsevier Inc.  

## 2021-11-30 ENCOUNTER — Encounter: Payer: Self-pay | Admitting: Nurse Practitioner

## 2021-11-30 ENCOUNTER — Ambulatory Visit (INDEPENDENT_AMBULATORY_CARE_PROVIDER_SITE_OTHER): Payer: Medicare Other | Admitting: Nurse Practitioner

## 2021-11-30 ENCOUNTER — Ambulatory Visit: Payer: Medicare Other | Admitting: Dermatology

## 2021-11-30 VITALS — BP 138/78 | HR 66 | Temp 97.7°F | Ht 73.0 in | Wt 184.1 lb

## 2021-11-30 DIAGNOSIS — Z0000 Encounter for general adult medical examination without abnormal findings: Secondary | ICD-10-CM | POA: Diagnosis not present

## 2021-11-30 DIAGNOSIS — E1140 Type 2 diabetes mellitus with diabetic neuropathy, unspecified: Secondary | ICD-10-CM | POA: Diagnosis not present

## 2021-11-30 DIAGNOSIS — I152 Hypertension secondary to endocrine disorders: Secondary | ICD-10-CM

## 2021-11-30 DIAGNOSIS — E1159 Type 2 diabetes mellitus with other circulatory complications: Secondary | ICD-10-CM

## 2021-11-30 DIAGNOSIS — E538 Deficiency of other specified B group vitamins: Secondary | ICD-10-CM | POA: Diagnosis not present

## 2021-11-30 DIAGNOSIS — E785 Hyperlipidemia, unspecified: Secondary | ICD-10-CM

## 2021-11-30 DIAGNOSIS — Z23 Encounter for immunization: Secondary | ICD-10-CM

## 2021-11-30 DIAGNOSIS — I739 Peripheral vascular disease, unspecified: Secondary | ICD-10-CM

## 2021-11-30 DIAGNOSIS — E1169 Type 2 diabetes mellitus with other specified complication: Secondary | ICD-10-CM

## 2021-11-30 DIAGNOSIS — Z8546 Personal history of malignant neoplasm of prostate: Secondary | ICD-10-CM

## 2021-11-30 LAB — BAYER DCA HB A1C WAIVED: HB A1C (BAYER DCA - WAIVED): 6.4 % — ABNORMAL HIGH (ref 4.8–5.6)

## 2021-11-30 MED ORDER — ROSUVASTATIN CALCIUM 10 MG PO TABS: ORAL_TABLET | ORAL | 4 refills | Status: DC

## 2021-11-30 MED ORDER — BENAZEPRIL HCL 40 MG PO TABS: 40.0000 mg | ORAL_TABLET | Freq: Every day | ORAL | 4 refills | Status: DC

## 2021-11-30 MED ORDER — ATENOLOL 50 MG PO TABS: 50.0000 mg | ORAL_TABLET | Freq: Every day | ORAL | 4 refills | Status: DC

## 2021-11-30 MED ORDER — METFORMIN HCL 500 MG PO TABS: 500.0000 mg | ORAL_TABLET | Freq: Three times a day (TID) | ORAL | 4 refills | Status: DC

## 2021-11-30 NOTE — Assessment & Plan Note (Signed)
Chronic, stable.  Urine ALB 10 and A:C <19 June 2021.  Continue current medication regimen, Benazepril offering kidney protection.  Recommend checking BP a few mornings a week at home and documenting + focus on DASH diet.  Monitor for worsening edema and may provide PRN Lasix if presents, suspect some underlyin PVD based on exam.  Recommend avoid Ibuprofen products and high salt intake.  Return in 6 months.  Labs today: CMP, CBC, TSH.

## 2021-11-30 NOTE — Assessment & Plan Note (Signed)
Suspect some underlying PVD based on exam with diminished lower extremity hair pattern, edema at ankles, and dry skin.  At this time monitor for wounds and continue frequent foot exams.  To notify provider if any wounds present or pain.

## 2021-11-30 NOTE — Assessment & Plan Note (Signed)
Chronic, ongoing.  Myalgia presenting with daily Atorvastatin.  Stop this and trial Rosuvastatin 10 MG three days a week, educated him on this and that may be less side effects with this statin.  If myalgia with 3 days a week dosing then will trial one day a week, if ongoing myalgia then stop and consider Zetia.

## 2021-11-30 NOTE — Assessment & Plan Note (Signed)
Recent visit with Dr. Erlene Quan and is cancer free.  Continue collaboration with urology and PSA with them.

## 2021-11-30 NOTE — Assessment & Plan Note (Signed)
Ongoing, stable on recent check.  Check B12 today, continue supplement daily.

## 2021-11-30 NOTE — Progress Notes (Signed)
BP 138/78   Pulse 66   Temp 97.7 F (36.5 C) (Oral)   Ht '6\' 1"'$  (1.854 m)   Wt 184 lb 1.6 oz (83.5 kg)   SpO2 95%   BMI 24.29 kg/m    Subjective:    Patient ID: Tyler Morgan, male    DOB: [birth year], 76 y.o.   MRN: [id number]  HPI: Tyler Morgan is a 76 y.o. male presenting on 11/30/2021 for comprehensive medical examination. Current medical complaints include:none  He currently lives with: wife Interim Problems from his last visit: no   DIABETES A1c 6.2% April and continues Metformin taking 500 MG TID.  Was on Jardiance in past, on for less than a year.  Continues on B12 supplement for history of low levels. Hypoglycemic episodes:no Polydipsia/polyuria: no Visual disturbance: no Chest pain: no Paresthesias: no Glucose Monitoring: no             Accucheck frequency: Not Checking             Fasting glucose:             Post prandial:             Evening:             Before meals: Taking Insulin?: no             Long acting insulin:             Short acting insulin: Blood Pressure Monitoring: rarely Retinal Examination: Up to Date -- [facility] Foot Exam: Up to Date Pneumovax: Up to Date Influenza: Up to Date Aspirin: no    HYPERTENSION / HYPERLIPIDEMIA Continues on Benazepril and Atenolol for HTN.  Tried Atorvastatin and had muscle aches with this, stopped taking.   Satisfied with current treatment? yes Duration of hypertension: chronic BP monitoring frequency: rarely BP range:  BP medication side effects: no Duration of hyperlipidemia: chronic Cholesterol medication side effects: no Cholesterol supplements: none Medication compliance: good compliance Aspirin: no Recent stressors: no Recurrent headaches: no Visual changes: no Palpitations: no Dyspnea: no Chest pain: no Lower extremity edema: no Dizzy/lightheaded: no The 10-year ASCVD risk score (Arnett DK, et al., 2019) is: 54.5%   Values used to calculate the score:     Age: 80 years      Sex: Male     Is Non-Hispanic African American: No     Diabetic: Yes     Tobacco smoker: No     Systolic Blood Pressure: 989 mmHg     Is BP treated: Yes     HDL Cholesterol: 46 mg/dL     Total Cholesterol: 222 mg/dL   PROSTATE CANCER: Followed by Dr. Erlene Quan and scheduled to see 01/03/22 was told he was "cancer free".  Continues to have monitoring of PSA with them.     Functional Status Survey: Is the patient deaf or have difficulty hearing?: No Does the patient have difficulty seeing, even when wearing glasses/contacts?: No Does the patient have difficulty concentrating, remembering, or making decisions?: No Does the patient have difficulty walking or climbing stairs?: No Does the patient have difficulty dressing or bathing?: No Does the patient have difficulty doing errands alone such as visiting a doctor's office or shopping?: No  FALL RISK:    11/30/2021    4:25 PM 11/30/2021    3:53 PM 11/14/2021    9:43 AM 05/31/2021    3:41 PM 11/29/2020    4:10 PM  Fall Risk   Falls in  the past year? 1 0 0 1 0  Number falls in past yr: 0 0 0 1 0  Injury with Fall? 0 0 0 0 0  Risk for fall due to : History of fall(s) No Fall Risks No Fall Risks History of fall(s) No Fall Risks  Follow up Falls prevention discussed Falls evaluation completed Falls evaluation completed Falls evaluation completed Falls evaluation completed    Depression Screen    11/30/2021    3:54 PM 11/14/2021    9:40 AM 05/31/2021    3:41 PM 11/29/2020    3:48 PM 11/12/2020   10:38 AM  Depression screen PHQ 2/9  Decreased Interest 0 0 0 0 0  Down, Depressed, Hopeless 0 0 0 0 0  PHQ - 2 Score 0 0 0 0 0  Altered sleeping 0 0 0    Tired, decreased energy 0 0 0    Change in appetite 0 0 0    Feeling bad or failure about yourself  0 0 0    Trouble concentrating 0 0 0    Moving slowly or fidgety/restless 0 0 0    Suicidal thoughts 0 0 0    PHQ-9 Score 0 0 0    Difficult doing work/chores Not difficult at all Not  difficult at all Not difficult at all     Past Medical History:  Past Medical History:  Diagnosis Date   Diabetes mellitus without complication [facility])    ED (erectile dysfunction)    Hypertension    Motion sickness    boats   Neuropathy    Prostate enlargement    Urinary retention     Surgical History:  Past Surgical History:  Procedure Laterality Date   BASAL CELL CARCINOMA EXCISION     nose    CATARACT EXTRACTION Bilateral 1986, 2014   COLONOSCOPY WITH PROPOFOL N/A 03/27/2018   Procedure: COLONOSCOPY WITH BIOPSIES;  Surgeon: Lin Landsman, MD;  Location: [facility];  Service: Endoscopy;  Laterality: N/A;  Diabetic - oral meds   EYE SURGERY Bilateral [phone]   FRACTURE SURGERY Right    as a child/ right arm   HERNIA REPAIR Right 01-13-14   inguinal hernia   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 N/A 02/07/2017   Procedure: 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;  Surgeon: Hollice Espy, MD;  Location: [facility];  Service: Urology;  Laterality: N/A;   POLYPECTOMY N/A 03/27/2018   Procedure: POLYPECTOMY;  Surgeon: Lin Landsman, MD;  Location: [facility];  Service: Endoscopy;  Laterality: N/A;   RETINAL DETACHMENT SURGERY  Jan 2015   TONSILLECTOMY  1955    Medications:  Current Outpatient Medications on File Prior to Visit  Medication Sig   TURMERIC PO Take 1 capsule by mouth daily.    vitamin B-12 (CYANOCOBALAMIN) 1000 MCG tablet Take 1,000 mcg by mouth daily.   VITAMIN D PO Take 500 Units by mouth daily.   No current facility-administered medications on file prior to visit.    Allergies:  Allergies  Allergen Reactions   Acetaminophen Swelling and Other (See Comments)    Swelling of liver    Amlodipine Swelling    Social History:  Social History   Socioeconomic History   Marital status: Married    Spouse name: Not on file   Number of children: Not on file   Years of education: Not on file    Highest education level: Bachelor's degree (e.g., BA, AB, BS)  Occupational History  Not on file  Tobacco Use   Smoking status: Former    Packs/day: 1.00    Years: 13.00    Total pack years: 13.00    Types: Cigarettes    Quit date: 02/21/1984    Years since quitting: 37.8   Smokeless tobacco: Never  Vaping Use   Vaping Use: Never used  Substance and Sexual Activity   Alcohol use: Yes    Alcohol/week: 2.0 standard drinks of alcohol    Types: 2 Cans of beer per week    Comment: wine occasionally    Drug use: No   Sexual activity: Not on file  Other Topics Concern   Not on file  Social History Narrative   Working fulltime    Social Determinants of Health   Financial Resource Strain: Low Risk  (11/14/2021)   Overall Financial Resource Strain (CARDIA)    Difficulty of Paying Living Expenses: Not hard at all  Food Insecurity: No Food Insecurity (11/14/2021)   Hunger Vital Sign    Worried About Running Out of Food in the Last Year: Never true    [place] in the Last Year: Never true  Transportation Needs: No Transportation Needs (11/14/2021)   PRAPARE - Hydrologist (Medical): No    Lack of Transportation (Non-Medical): No  Physical Activity: Sufficiently Active (11/14/2021)   Exercise Vital Sign    Days of Exercise per Week: 7 days    Minutes of Exercise per Session: 30 min  Stress: No Stress Concern Present (11/14/2021)   [place]    Feeling of Stress : Not at all  Social Connections: Moderately Isolated (11/14/2021)   Social Connection and Isolation Panel [NHANES]    Frequency of Communication with Friends and Family: More than three times a week    Frequency of Social Gatherings with Friends and Family: Twice a week    Attends Religious Services: Never    Marine scientist or Organizations: No    Attends Archivist Meetings: Never    Marital Status: Married   Human resources officer Violence: Not At Risk (11/14/2021)   Humiliation, Afraid, Rape, and Kick questionnaire    Fear of Current or Ex-Partner: No    Emotionally Abused: No    Physically Abused: No    Sexually Abused: No   Social History   Tobacco Use  Smoking Status Former   Packs/day: 1.00   Years: 13.00   Total pack years: 13.00   Types: Cigarettes   Quit date: 02/21/1984   Years since quitting: 37.8  Smokeless Tobacco Never   Social History   Substance and Sexual Activity  Alcohol Use Yes   Alcohol/week: 2.0 standard drinks of alcohol   Types: 2 Cans of beer per week   Comment: wine occasionally     Family History:  Family History  Problem Relation Age of Onset   Colon cancer Father    Angina Father    Macular degeneration Father    Alzheimer's disease Mother    Prostate cancer Neg Hx    Bladder Cancer Neg Hx    Kidney cancer Neg Hx     Past medical history, surgical history, medications, allergies, family history and social history reviewed with patient today and changes made to appropriate areas of the chart.   Review of Systems - negative All other ROS negative except what is listed above and in the HPI.  Objective:    BP 138/78   Pulse 66   Temp 97.7 F (36.5 C) (Oral)   Ht '6\' 1"'$  (1.854 m)   Wt 184 lb 1.6 oz (83.5 kg)   SpO2 95%   BMI 24.29 kg/m   Wt Readings from Last 3 Encounters:  11/30/21 184 lb 1.6 oz (83.5 kg)  05/31/21 185 lb (83.9 kg)  01/03/21 183 lb (83 kg)    Physical Exam Vitals and nursing note reviewed.  Constitutional:      General: He is awake. He is not in acute distress.    Appearance: He is well-developed and well-groomed. He is not ill-appearing.  HENT:     Head: Normocephalic and atraumatic.     Right Ear: Hearing, ear canal and external ear normal. No drainage.     Left Ear: Hearing, ear canal and external ear normal. No drainage.     Nose: Nose normal.     Mouth/Throat:     Pharynx: Uvula midline.  Eyes:      General: Lids are normal.        Right eye: No discharge.        Left eye: No discharge.     Extraocular Movements: Extraocular movements intact.     Conjunctiva/sclera: Conjunctivae normal.     Pupils: Pupils are equal, round, and reactive to light.     Visual Fields: Right eye visual fields normal and left eye visual fields normal.  Neck:     Thyroid: No thyromegaly.     Vascular: No carotid bruit or JVD.     Trachea: Trachea normal.  Cardiovascular:     Rate and Rhythm: Normal rate and regular rhythm.     Heart sounds: Normal heart sounds, S1 normal and S2 normal. No murmur heard.    No gallop.  Pulmonary:     Effort: Pulmonary effort is normal. No accessory muscle usage or respiratory distress.     Breath sounds: Normal breath sounds.  Abdominal:     General: Bowel sounds are normal.     Palpations: Abdomen is soft. There is no hepatomegaly or splenomegaly.     Tenderness: There is no abdominal tenderness.  Musculoskeletal:        General: Normal range of motion.     Cervical back: Normal range of motion and neck supple.     Right lower leg: 2+ Edema present.     Left lower leg: 2+ Edema present.  Lymphadenopathy:     Head:     Right side of head: No submental, submandibular, tonsillar, preauricular or posterior auricular adenopathy.     Left side of head: No submental, submandibular, tonsillar, preauricular or posterior auricular adenopathy.     Cervical: No cervical adenopathy.  Skin:    General: Skin is warm and dry.     Capillary Refill: Capillary refill takes less than 2 seconds.     Findings: No rash.  Neurological:     Mental Status: He is alert and oriented to person, place, and time.     Gait: Gait is intact.     Deep Tendon Reflexes: Reflexes are normal and symmetric.     Reflex Scores:      Brachioradialis reflexes are 2+ on the right side and 2+ on the left side.      Patellar reflexes are 2+ on the right side and 2+ on the left side. Psychiatric:         Attention and Perception: Attention normal.  Mood and Affect: Mood normal.        Speech: Speech normal.        Behavior: Behavior normal. Behavior is cooperative.        Thought Content: Thought content normal.        Cognition and Memory: Cognition normal.        Judgment: Judgment normal.    Diabetic Foot Exam - Simple   Simple Foot Form Visual Inspection See comments: Yes Sensation Testing See comments: Yes Pulse Check Posterior Tibialis and Dorsalis pulse intact bilaterally: Yes Comments Dry skin bilateral feet and 2+ edema at ankles (baseline).  Thickened toenails bilateral feet. Left foot 8/10 and Right foot 8/10 monofilament testing.    Results for orders placed or performed in visit on 11/30/21  Bayer DCA Hb A1c Waived  Result Value Ref Range   HB A1C (BAYER DCA - WAIVED) 6.4 (H) 4.8 - 5.6 %      Assessment & Plan:   Problem List Items Addressed This Visit       Cardiovascular and Mediastinum   Hypertension associated with diabetes ([place])    Chronic, stable.  Urine ALB 10 and A:C <19 June 2021.  Continue current medication regimen, Benazepril offering kidney protection.  Recommend checking BP a few mornings a week at home and documenting + focus on DASH diet.  Monitor for worsening edema and may provide PRN Lasix if presents, suspect some underlyin PVD based on exam.  Recommend avoid Ibuprofen products and high salt intake.  Return in 6 months.  Labs today: CMP, CBC, TSH.      Relevant Medications   rosuvastatin (CRESTOR) 10 MG tablet   benazepril (LOTENSIN) 40 MG tablet   metFORMIN (GLUCOPHAGE) 500 MG tablet   atenolol (TENORMIN) 50 MG tablet   Other Relevant Orders   Bayer DCA Hb A1c Waived (Completed)   CBC with Differential/Platelet   Comprehensive metabolic panel   TSH   PVD (peripheral vascular disease) (HCC)    Suspect some underlying PVD based on exam with diminished lower extremity hair pattern, edema at ankles, and dry skin.  At this time  monitor for wounds and continue frequent foot exams.  To notify provider if any wounds present or pain.      Relevant Medications   rosuvastatin (CRESTOR) 10 MG tablet   benazepril (LOTENSIN) 40 MG tablet   atenolol (TENORMIN) 50 MG tablet     Endocrine   Hyperlipidemia associated with type 2 diabetes mellitus (HCC)    Chronic, ongoing.  Myalgia presenting with daily Atorvastatin.  Stop this and trial Rosuvastatin 10 MG three days a week, educated him on this and that may be less side effects with this statin.  If myalgia with 3 days a week dosing then will trial one day a week, if ongoing myalgia then stop and consider Zetia.      Relevant Medications   rosuvastatin (CRESTOR) 10 MG tablet   benazepril (LOTENSIN) 40 MG tablet   metFORMIN (GLUCOPHAGE) 500 MG tablet   atenolol (TENORMIN) 50 MG tablet   Other Relevant Orders   Bayer DCA Hb A1c Waived (Completed)   Comprehensive metabolic panel   Lipid Panel w/o Chol/HDL Ratio   Type 2 diabetes mellitus with diabetic neuropathy (HCC) - Primary    Chronic, ongoing with A1c 6.4% today, urine ALB 10 and A:C <30 in April 2023.  Continue Metformin TID and diet focus -- may reduce this dosing at next visit if remains stable.  Recommend he check BS  at least three mornings a week and document.  If elevation above goal A1c would consider increasing Metformin to max or adding SGLT.  Discussed with him medications we can use for neuropathy or consider PT in future for strengthening and fall prevention.  Return in 6 months for A1c check.      Relevant Medications   rosuvastatin (CRESTOR) 10 MG tablet   benazepril (LOTENSIN) 40 MG tablet   metFORMIN (GLUCOPHAGE) 500 MG tablet   Other Relevant Orders   Bayer DCA Hb A1c Waived (Completed)     Other   B12 deficiency    Ongoing, stable on recent check.  Check B12 today, continue supplement daily.      Relevant Orders   CBC with Differential/Platelet   Vitamin B12   History of prostate cancer     Recent visit with Dr. Erlene Quan and is cancer free.  Continue collaboration with urology and PSA with them.      Other Visit Diagnoses     Flu vaccine need       Flu vaccine in office today, discussed with patient.   Relevant Orders   Flu Vaccine QUAD High Dose(Fluad) (Completed)   Encounter for annual physical exam       Annual physical today with labs and health maintenance reviewed, discussed with patient.       Discussed aspirin prophylaxis for myocardial infarction prevention and decision was that we recommended ASA, and patient refused  LABORATORY TESTING:  Health maintenance labs ordered today as discussed above.   The natural history of prostate cancer and ongoing controversy regarding screening and potential treatment outcomes of prostate cancer has been discussed with the patient. The meaning of a false positive PSA and a false negative PSA has been discussed. He indicates understanding of the limitations of this screening test and wishes to proceed with screening PSA testing -- has performed with urology.  IMMUNIZATIONS:   - Tdap: Tetanus vaccination status reviewed: last tetanus booster within 10 years. - Influenza: Up to date - Pneumovax: Up to date - Prevnar: Up to date - Zostavax vaccine: Up to date - Covid vaccines = Up To Date  SCREENING: - Colonoscopy: Up to date  Discussed with patient purpose of the colonoscopy is to detect colon cancer at curable precancerous or early stages   - AAA Screening: Not applicable  -Hearing Test: Not applicable  -Spirometry: Not applicable   PATIENT COUNSELING:    Sexuality: Discussed sexually transmitted diseases, partner selection, use of condoms, avoidance of unintended pregnancy  and contraceptive alternatives.   Advised to avoid cigarette smoking.  I discussed with the patient that most people either abstain from alcohol or drink within safe limits (<=14/week and <=4 drinks/occasion for males, <=7/weeks and <= 3  drinks/occasion for females) and that the risk for alcohol disorders and other health effects rises proportionally with the number of drinks per week and how often a drinker exceeds daily limits.  Discussed cessation/primary prevention of drug use and availability of treatment for abuse.   Diet: Encouraged to adjust caloric intake to maintain  or achieve ideal body weight, to reduce intake of dietary saturated fat and total fat, to limit sodium intake by avoiding high sodium foods and not adding table salt, and to maintain adequate dietary potassium and calcium preferably from fresh fruits, vegetables, and low-fat dairy products.    Stressed the importance of regular exercise  Injury prevention: Discussed safety belts, safety helmets, smoke detector, smoking near bedding or upholstery.   Dental  health: Discussed importance of regular tooth brushing, flossing, and dental visits.   Follow up plan: NEXT PREVENTATIVE PHYSICAL DUE IN 1 YEAR. Return in about 6 months (around 06/01/2022) for T2DM, HTN/HLD, H/O PROSTATE CA.

## 2021-11-30 NOTE — Assessment & Plan Note (Addendum)
Chronic, ongoing with A1c 6.4% today, urine ALB 10 and A:C <30 in April 2023.  Continue Metformin TID and diet focus -- may reduce this dosing at next visit if remains stable.  Recommend he check BS at least three mornings a week and document.  If elevation above goal A1c would consider increasing Metformin to max or adding SGLT.  Discussed with him medications we can use for neuropathy or consider PT in future for strengthening and fall prevention.  Return in 6 months for A1c check.

## 2021-12-01 LAB — COMPREHENSIVE METABOLIC PANEL
ALT: 14 IU/L (ref 0–44)
AST: 17 IU/L (ref 0–40)
Albumin/Globulin Ratio: 2 (ref 1.2–2.2)
Albumin: 4.5 g/dL (ref 3.8–4.8)
Alkaline Phosphatase: 57 IU/L (ref 44–121)
BUN/Creatinine Ratio: 28 — ABNORMAL HIGH (ref 10–24)
BUN: 20 mg/dL (ref 8–27)
Bilirubin Total: 0.4 mg/dL (ref 0.0–1.2)
CO2: 23 mmol/L (ref 20–29)
Calcium: 9.6 mg/dL (ref 8.6–10.2)
Chloride: 104 mmol/L (ref 96–106)
Creatinine, Ser: 0.72 mg/dL — ABNORMAL LOW (ref 0.76–1.27)
Globulin, Total: 2.2 g/dL (ref 1.5–4.5)
Glucose: 113 mg/dL — ABNORMAL HIGH (ref 70–99)
Potassium: 4 mmol/L (ref 3.5–5.2)
Sodium: 142 mmol/L (ref 134–144)
Total Protein: 6.7 g/dL (ref 6.0–8.5)
eGFR: 95 mL/min/{1.73_m2} (ref >59)

## 2021-12-01 LAB — CBC WITH DIFFERENTIAL/PLATELET
Basophils Absolute: 0 10*3/uL (ref 0.0–0.2)
Basos: 0 %
EOS (ABSOLUTE): 0.3 10*3/uL (ref 0.0–0.4)
Eos: 5 %
Hematocrit: 41.6 % (ref 37.5–51.0)
Hemoglobin: 14.1 g/dL (ref 13.0–17.7)
Immature Grans (Abs): 0 10*3/uL (ref 0.0–0.1)
Immature Granulocytes: 0 %
Lymphocytes Absolute: 1.3 10*3/uL (ref 0.7–3.1)
Lymphs: 24 %
MCH: 28.7 pg (ref 26.6–33.0)
MCHC: 33.9 g/dL (ref 31.5–35.7)
MCV: 85 fL (ref 79–97)
Monocytes Absolute: 0.5 10*3/uL (ref 0.1–0.9)
Monocytes: 9 %
Neutrophils Absolute: 3.4 10*3/uL (ref 1.4–7.0)
Neutrophils: 62 %
Platelets: 222 10*3/uL (ref 150–450)
RBC: 4.92 x10E6/uL (ref 4.14–5.80)
RDW: 12.9 % (ref 11.6–15.4)
WBC: 5.5 10*3/uL (ref 3.4–10.8)

## 2021-12-01 LAB — TSH: TSH: 0.922 u[IU]/mL (ref 0.450–4.500)

## 2021-12-01 LAB — LIPID PANEL W/O CHOL/HDL RATIO
Cholesterol, Total: 214 mg/dL — ABNORMAL HIGH (ref 100–199)
HDL: 44 mg/dL (ref >39)
LDL Chol Calc (NIH): 134 mg/dL — ABNORMAL HIGH (ref 0–99)
Triglycerides: 199 mg/dL — ABNORMAL HIGH (ref 0–149)
VLDL Cholesterol Cal: 36 mg/dL (ref 5–40)

## 2021-12-01 LAB — VITAMIN B12: Vitamin B-12: 852 pg/mL (ref 232–1245)

## 2021-12-01 NOTE — Progress Notes (Signed)
Contacted via MyChart  Good evening Tyler Morgan, your labs have returned: - Kidney function, creatinine and eGFR, remains normal, as is liver function, AST and ALT.   - Cholesterol labs remain elevated above goal, would like to see that LDL back in 70 range for stroke prevention.  Please try that Rosuvastatin I sent in 3 days a week and let me know if any issues with this. - Remainder of your labs look great!!  Any questions? Keep being amazing!!  Thank you for allowing me to participate in your care.  I appreciate you. Kindest regards, Vaani Morren

## 2021-12-05 ENCOUNTER — Other Ambulatory Visit: Payer: Self-pay | Admitting: Nurse Practitioner

## 2021-12-06 NOTE — Telephone Encounter (Signed)
Requested medications are due for refill today.  unsure  Requested medications are on the active medications list.  no  Last refill. 12/02/2020  Future visit scheduled.   yes  Notes to clinic.  Med not on med list, a different statin is.   Requested Prescriptions  Pending Prescriptions Disp Refills   atorvastatin (LIPITOR) 20 MG tablet [Pharmacy Med Name: ATORVASTATIN CALCIUM 20 MG TAB] 90 tablet     Sig: TAKE 1 TABLET BY MOUTH DAILY     Cardiovascular:  Antilipid - Statins Failed - 12/05/2021  5:44 PM      Failed - Lipid Panel in normal range within the last 12 months    Cholesterol, Total  Date Value Ref Range Status  11/30/2021 214 (H) 100 - 199 mg/dL Final   Cholesterol Piccolo, Waived  Date Value Ref Range Status  07/30/2018 131 <200 mg/dL Final    Comment:                            Desirable                <200                         Borderline High      200- 239                         High                     >239    LDL Chol Calc (NIH)  Date Value Ref Range Status  11/30/2021 134 (H) 0 - 99 mg/dL Final   HDL  Date Value Ref Range Status  11/30/2021 44 >39 mg/dL Final   Triglycerides  Date Value Ref Range Status  11/30/2021 199 (H) 0 - 149 mg/dL Final   Triglycerides Piccolo,Waived  Date Value Ref Range Status  07/30/2018 121 <150 mg/dL Final    Comment:                            Normal                   <150                         Borderline High     150 - 199                         High                200 - 499                         Very High                >499          Passed - Patient is not pregnant      Passed - Valid encounter within last 12 months    Recent Outpatient Visits           6 days ago Type 2 diabetes mellitus with diabetic neuropathy, without long-term current use of insulin ([place])   [place], Pence T, NP   6 months ago Type 2 diabetes mellitus with diabetic neuropathy,  without long-term current  use of insulin ([place])   [place], [place] T, NP   11 months ago Acute cough   [place], Lauren A, NP   1 year ago Type 2 diabetes mellitus with diabetic neuropathy, without long-term current use of insulin ([place])   [place], [place] T, NP   1 year ago COVID-19 virus RNA test result positive at limit of detection   [facility] Venita Lick, NP       Future Appointments             In 1 week Ralene Bathe, MD [place]   In 4 weeks Hollice Espy, MD [place]   In 5 months [place], Barbaraann Faster, NP [facility]

## 2021-12-19 ENCOUNTER — Ambulatory Visit: Payer: Medicare Other | Admitting: Dermatology

## 2021-12-19 DIAGNOSIS — L821 Other seborrheic keratosis: Secondary | ICD-10-CM

## 2021-12-19 DIAGNOSIS — Z79899 Other long term (current) drug therapy: Secondary | ICD-10-CM

## 2021-12-19 DIAGNOSIS — L814 Other melanin hyperpigmentation: Secondary | ICD-10-CM

## 2021-12-19 DIAGNOSIS — B353 Tinea pedis: Secondary | ICD-10-CM | POA: Diagnosis not present

## 2021-12-19 DIAGNOSIS — D229 Melanocytic nevi, unspecified: Secondary | ICD-10-CM | POA: Diagnosis not present

## 2021-12-19 DIAGNOSIS — D1722 Benign lipomatous neoplasm of skin and subcutaneous tissue of left arm: Secondary | ICD-10-CM | POA: Diagnosis not present

## 2021-12-19 DIAGNOSIS — L578 Other skin changes due to chronic exposure to nonionizing radiation: Secondary | ICD-10-CM | POA: Diagnosis not present

## 2021-12-19 DIAGNOSIS — Z1283 Encounter for screening for malignant neoplasm of skin: Secondary | ICD-10-CM

## 2021-12-19 DIAGNOSIS — Z808 Family history of malignant neoplasm of other organs or systems: Secondary | ICD-10-CM

## 2021-12-19 DIAGNOSIS — L720 Epidermal cyst: Secondary | ICD-10-CM

## 2021-12-19 MED ORDER — TERBINAFINE HCL 250 MG PO TABS: 250.0000 mg | ORAL_TABLET | Freq: Every day | ORAL | 0 refills | Status: DC

## 2021-12-19 NOTE — Patient Instructions (Addendum)
Terbinafine Counseling  Terbinafine is an anti-fungal medicine that can be applied to the skin (over the counter) or taken by mouth (prescription) to treat fungal infections. The pill version is often used to treat fungal infections of the nails or scalp. While most people do not have any side effects from taking terbinafine pills, some possible side effects of the medicine can include taste changes, headache, loss of smell, vision changes, nausea, vomiting, or diarrhea.   Rare side effects can include irritation of the liver, allergic reaction, or decrease in blood counts (which may show up as not feeling well or developing an infection). If you are concerned about any of these side effects, please stop the medicine and call your doctor, or in the case of an emergency such as feeling very unwell, seek immediate medical care.       Melanoma ABCDEs  Melanoma is the most dangerous type of skin cancer, and is the leading cause of death from skin disease.  You are more likely to develop melanoma if you: Have light-colored skin, light-colored eyes, or red or blond hair Spend a lot of time in the sun Tan regularly, either outdoors or in a tanning bed Have had blistering sunburns, especially during childhood Have a close family member who has had a melanoma Have atypical moles or large birthmarks  Early detection of melanoma is key since treatment is typically straightforward and cure rates are extremely high if we catch it early.   The first sign of melanoma is often a change in a mole or a new dark spot.  The ABCDE system is a way of remembering the signs of melanoma.  A for asymmetry:  The two halves do not match. B for border:  The edges of the growth are irregular. C for color:  A mixture of colors are present instead of an even brown color. D for diameter:  Melanomas are usually (but not always) greater than 30m - the size of a pencil eraser. E for evolution:  The spot keeps changing in  size, shape, and color.  Please check your skin once per month between visits. You can use a small mirror in front and a large mirror behind you to keep an eye on the back side or your body.   If you see any new or changing lesions before your next follow-up, please call to schedule a visit.  Please continue daily skin protection including broad spectrum sunscreen SPF 30+ to sun-exposed areas, reapplying every 2 hours as needed when you're outdoors.   Staying in the shade or wearing long sleeves, sun glasses (UVA+UVB protection) and wide brim hats (4-inch brim around the entire circumference of the hat) are also recommended for sun protection.     Due to recent changes in healthcare laws, you may see results of your pathology and/or laboratory studies on MyChart before the doctors have had a chance to review them. We understand that in some cases there may be results that are confusing or concerning to you. Please understand that not all results are received at the same time and often the doctors may need to interpret multiple results in order to provide you with the best plan of care or course of treatment. Therefore, we ask that you please give u[place]2 business days to thoroughly review all your results before contacting the office for clarification. Should we see a critical lab result, you will be contacted sooner.   If You Need Anything After Your Visit  If  you have any questions or concerns for your doctor, please call our main line at [phone] and press option 4 to reach your doctor's medical assistant. If no one answers, please leave a voicemail as directed and we will return your call as soon as possible. Messages left after 4 pm will be answered the following business day.   You may also send [place] a message via [place]. We typically respond to MyChart messages within 1-2 business days.  For prescription refills, please ask your pharmacy to contact our office. Our fax number is  [phone].  If you have an urgent issue when the clinic is closed that cannot wait until the next business day, you can page your doctor at the number below.    Please note that while we do our best to be available for urgent issues outside of office hours, we are not available 24/7.   If you have an urgent issue and are unable to reach [place], you may choose to seek medical care at your doctor's office, retail clinic, urgent care center, or emergency room.  If you have a medical emergency, please immediately call 911 or go to the emergency department.  Pager Numbers  - Dr. Nehemiah Massed: [phone]  - Dr. Laurence Ferrari: [phone]  - Dr. Nicole Kindred: [phone]  In the event of inclement weather, please call our main line at [phone] for an update on the status of any delays or closures.  Dermatology Medication Tips: Please keep the boxes that topical medications come in in order to help keep track of the instructions about where and how to use these. Pharmacies typically print the medication instructions only on the boxes and not directly on the medication tubes.   If your medication is too expensive, please contact our office at [phone] option 4 or send [place] a message through [place].   We are unable to tell what your co-pay for medications will be in advance as this is different depending on your insurance coverage. However, we may be able to find a substitute medication at lower cost or fill out paperwork to get insurance to cover a needed medication.   If a prior authorization is required to get your medication covered by your insurance company, please allow [place] 1-2 business days to complete this process.  Drug prices often vary depending on where the prescription is filled and some pharmacies may offer cheaper prices.  The website www.goodrx.com contains coupons for medications through different pharmacies. The prices here do not account for what the cost may be with help from  insurance (it may be cheaper with your insurance), but the website can give you the price if you did not use any insurance.  - You can print the associated coupon and take it with your prescription to the pharmacy.  - You may also stop by our office during regular business hours and pick up a GoodRx coupon card.  - If you need your prescription sent electronically to a different pharmacy, notify our office through [facility] or by phone at [phone] option 4.     Si Usted Necesita Algo Despus de Su Visita  Tambin puede enviarnos un mensaje a travs de Pharmacist, community. Por lo general respondemos a los mensajes de MyChart en el transcurso de 1 a 2 das hbiles.  Para renovar recetas, por favor pida a su farmacia que se ponga en contacto con nuestra oficina. Harland Dingwall de fax es [place] [phone].  Si tiene un asunto urgente cuando la clnica est cerrada  y que no puede esperar hasta el siguiente da hbil, puede llamar/localizar a su doctor(a) al nmero que aparece a continuacin.   Por favor, tenga en cuenta que aunque hacemos todo lo posible para estar disponibles para asuntos urgentes fuera del horario de [place], no estamos disponibles las 24 horas del da, los 7 das de la [place].   Si tiene un problema urgente y no puede comunicarse con nosotros, puede optar por buscar atencin mdica  en el consultorio de su doctor(a), en una clnica privada, en un centro de atencin urgente o en una sala de emergencias.  Si tiene Engineering geologist, por favor llame inmediatamente al 911 o vaya a la sala de emergencias.  Nmeros de bper  - Dr. Nehemiah Massed: [phone]  - Dra. Moye: [phone]  - Dra. Nicole Kindred: [phone]  En caso de inclemencias del [place], por favor llame a Johnsie Kindred principal al [phone] para una actualizacin sobre el [place] de cualquier retraso o cierre.  Consejos para la medicacin en dermatologa: Por favor, guarde las cajas en las que vienen los  medicamentos de uso tpico para ayudarle a seguir las instrucciones sobre dnde y cmo usarlos. Las farmacias generalmente imprimen las instrucciones del medicamento slo en las cajas y no directamente en los tubos del [place].   Si su medicamento es muy caro, por favor, pngase en contacto con Zigmund Daniel llamando al [phone] y presione la opcin 4 o envenos un mensaje a travs de Pharmacist, community.   No podemos decirle cul ser su copago por los medicamentos por adelantado ya que esto es diferente dependiendo de la cobertura de su seguro. Sin embargo, es posible que podamos encontrar un medicamento sustituto a Electrical engineer un formulario para que el seguro cubra el medicamento que se considera necesario.   Si se requiere una autorizacin previa para que su compaa de seguros [place] su medicamento, por favor permtanos de 1 a 2 das hbiles para completar este proceso.  Los precios de los medicamentos varan con frecuencia dependiendo del Environmental consultant de dnde se surte la receta y alguna farmacias pueden ofrecer precios ms baratos.  El sitio web www.goodrx.com tiene cupones para medicamentos de Airline pilot. Los precios aqu no tienen en cuenta lo que podra costar con la ayuda del seguro (puede ser ms barato con su seguro), pero el sitio web puede darle el precio si no utiliz Research scientist (physical sciences).  - Puede imprimir el cupn correspondiente y llevarlo con su receta a la farmacia.  - Tambin puede pasar por nuestra oficina durante el horario de atencin regular y Charity fundraiser una tarjeta de cupones de GoodRx.  - Si necesita que su receta se enve electrnicamente a una farmacia diferente, informe a nuestra oficina a travs de MyChart de [place] o por telfono llamando al [phone] y presione la opcin 4.

## 2021-12-19 NOTE — Progress Notes (Unsigned)
New Patient Visit  Subjective  Tyler Morgan is a 76 y.o. male who presents for the following: New Patient (Initial Visit) (Tbse. No personal history of skin cancer. Reports family history of skin cancer in maternal uncle. ). The patient presents for Total-Body Skin Exam (TBSE) for skin cancer screening and mole check.  The patient has spots, moles and lesions to be evaluated, some may be new or changing and the patient has concerns that these could be cancer.  The following portions of the chart were reviewed this encounter and updated as appropriate:   Tobacco  Allergies  Meds  Problems  Med Hx  Surg Hx  Fam Hx     Review of Systems:  No other skin or systemic complaints except as noted in HPI or Assessment and Plan.  Objective  Well appearing patient in no apparent distress; mood and affect are within normal limits.  A full examination was performed including scalp, head, eyes, ears, nose, lips, neck, chest, axillae, abdomen, back, buttocks, bilateral upper extremities, bilateral lower extremities, hands, feet, fingers, toes, fingernails, and toenails. All findings within normal limits unless otherwise noted below.  Left Forearm - Anterior Rubbery nodule   Right Upper Back 1.5 cm Subcutaneous nodule.   b/l feet and nails Scaling and maceration web spaces and over distal and lateral soles.    Assessment & Plan  Lipoma of left upper extremity Left Forearm - Anterior Benign-appearing.  Observation.  Call clinic for new or changing lesions.   Epidermal inclusion cyst Right Upper Back Benign-appearing. Exam most consistent with an epidermal inclusion cyst. Discussed that a cyst is a benign growth that can grow over time and sometimes get irritated or inflamed. Recommend observation if it is not bothersome. Discussed option of surgical excision to remove it if it is growing, symptomatic, or other changes noted. Please call for new or changing lesions so they can be  evaluated.  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 Condition is symptomatic / bothersome to patient. Not to goal.  Liver test in October 2023 reviewed and are normal  Start terbinafine 250 mg tab po qd for 30 days Recheck in January 2024   Terbinafine Counseling  Terbinafine is an anti-fungal medicine that can be applied to the skin (over the counter) or taken by mouth (prescription) to treat fungal infections. The pill version is often used to treat fungal infections of the nails or scalp. While most people do not have any side effects from taking terbinafine pills, some possible side effects of the medicine can include taste changes, headache, loss of smell, vision changes, nausea, vomiting, or diarrhea.   Rare side effects can include irritation of the liver, allergic reaction, or decrease in blood counts (which may show up as not feeling well or developing an infection). If you are concerned about any of these side effects, please stop the medicine and call your doctor, or in the case of an emergency such as feeling very unwell, seek immediate medical care.   terbinafine (LAMISIL) 250 MG tablet - b/l feet and nails Take 1 tablet (250 mg total) by mouth daily. Tinea pedis  Lentigines - Scattered tan macules - Due to sun exposure - Benign-appearing, observe - Recommend daily broad spectrum sunscreen SPF 30+ to sun-exposed areas, reapply every 2 hours as needed. - Call for any changes  Sebaceous Hyperplasia - Small yellow papules with a  central dell - Benign - Observe  Seborrheic Keratoses - Stuck-on, waxy, tan-brown papules and/or plaques  - Benign-appearing - Discussed benign etiology and prognosis. - Observe - Call for any changes  Melanocytic Nevi - Tan-brown and/or pink-flesh-colored symmetric macules and papules - Benign appearing on exam today - Observation -  Call clinic for new or changing moles - Recommend daily use of broad spectrum spf 30+ sunscreen to sun-exposed areas.   Hemangiomas - Red papules - Discussed benign nature - Observe - Call for any changes  Actinic Damage - Chronic condition, secondary to cumulative UV/sun exposure - diffuse scaly erythematous macules with underlying dyspigmentation - Recommend daily broad spectrum sunscreen SPF 30+ to sun-exposed areas, reapply every 2 hours as needed.  - Staying in the shade or wearing long sleeves, sun glasses (UVA+UVB protection) and wide brim hats (4-inch brim around the entire circumference of the hat) are also recommended for sun protection.  - Call for new or changing lesions.  Family history of skin cancer - what type(s): melanoma - who affected:maternal uncle   Skin cancer screening performed today. Return for jan follow up and 1 year tbse .  IRuthell Rummage, CMA, am acting as scribe for Sarina Ser, MD.  Documentation: I have reviewed the above documentation for accuracy and completeness, and I agree with the above.  Sarina Ser, MD

## 2021-12-20 ENCOUNTER — Encounter: Payer: Self-pay | Admitting: Dermatology

## 2021-12-27 ENCOUNTER — Other Ambulatory Visit: Payer: Medicare Other

## 2021-12-28 ENCOUNTER — Other Ambulatory Visit: Payer: Self-pay

## 2021-12-28 DIAGNOSIS — C61 Malignant neoplasm of prostate: Secondary | ICD-10-CM

## 2021-12-29 ENCOUNTER — Other Ambulatory Visit: Payer: Medicare Other

## 2021-12-29 DIAGNOSIS — C61 Malignant neoplasm of prostate: Secondary | ICD-10-CM

## 2021-12-30 LAB — PSA: Prostate Specific Ag, Serum: 2.1 ng/mL (ref 0.0–4.0)

## 2022-01-03 ENCOUNTER — Ambulatory Visit: Payer: Medicare Other | Admitting: Urology

## 2022-01-05 ENCOUNTER — Ambulatory Visit: Payer: Medicare Other | Admitting: Urology

## 2022-01-10 ENCOUNTER — Ambulatory Visit (LOCAL_COMMUNITY_HEALTH_CENTER): Payer: Medicare Other

## 2022-01-10 DIAGNOSIS — Z23 Encounter for immunization: Secondary | ICD-10-CM

## 2022-01-10 DIAGNOSIS — Z7185 Encounter for immunization safety counseling: Secondary | ICD-10-CM

## 2022-01-10 NOTE — Progress Notes (Signed)
  Are you feeling sick today? No   Have you ever received a dose of COVID-19 Vaccine? [employer], [place], [place], [place], Other) Yes  If yes, which vaccine and how many doses?   Pfizer and 5 doses   Did you bring the vaccination record card or other documentation?  Yes   Do you have a health condition or are undergoing treatment that makes you moderately or severely immunocompromised? This would include, but not be limited to: cancer, HIV, organ transplant, immunosuppressive therapy/high-dose corticosteroids, or moderate/severe primary immunodeficiency.  No  Have you received COVID-19 vaccine before or during hematopoietic cell transplant (HCT) or CAR-T-cell therapies? No  Have you ever had an allergic reaction to: (This would include a severe allergic reaction or a reaction that caused hives, swelling, or respiratory distress, including wheezing.) A component of a COVID-19 vaccine or a previous dose of COVID-19 vaccine? No   Have you ever had an allergic reaction to another vaccine (other thanCOVID-19 vaccine) or an injectable medication? (This would include a severe allergic reaction or a reaction that caused hives, swelling, or respiratory distress, including wheezing.)   No    Do you have a history of any of the following:  Myocarditis or Pericarditis No  Dermal fillers:  No  Multisystem Inflammatory Syndrome (MIS-C or MIS-A)? No  COVID-19 disease within the past 3 months? No  Vaccinated with monkeypox vaccine in the last 4 weeks? No  Pfizer Comirnaty [phone] (12 yrs+) given and tolerated well. Updated [facility] copy and Covid card given. Stayed for 15 min observation without problem. Josie Saunders, RN

## 2022-01-11 ENCOUNTER — Ambulatory Visit: Payer: Medicare Other | Admitting: Urology

## 2022-01-11 VITALS — BP 156/84 | HR 71 | Ht 73.0 in | Wt 182.0 lb

## 2022-01-11 DIAGNOSIS — N401 Enlarged prostate with lower urinary tract symptoms: Secondary | ICD-10-CM | POA: Diagnosis not present

## 2022-01-11 DIAGNOSIS — C61 Malignant neoplasm of prostate: Secondary | ICD-10-CM | POA: Diagnosis not present

## 2022-01-11 DIAGNOSIS — R350 Frequency of micturition: Secondary | ICD-10-CM | POA: Diagnosis not present

## 2022-01-11 NOTE — Progress Notes (Signed)
01/11/2022 10:45 AM   Tyler Morgan 01/10/1946 [id number]  Referring provider: Venita Lick, NP [address] [place],  [place] [address]  Chief Complaint  Patient presents with   Elevated PSA    HPI: 76 year old male with low risk incidental prostate cancer who presents today for annual follow-up of this.  He is s/p of laser nucleation of massive prostate 01/2017 with incidental low risk prostate cancer returns today for surveillance.   A total of 114 g were resected.  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   He also has a personal history of elevated PSA initiaially post-op PSA was  0.9 08/2017 on finasteride.    As further evaluation for this, he underwent prostate MRI on 12/05/2018.  This revealed a 2.2 cm peripheral zone nodule at the left lateral and posterior mid apex which was read as of PI-RADS 5 lesion.  No evidence of extracapsular extension or neurovascular involvement.  No lymphadenopathy.  Estimated residual prostatic volume 74 cc.   He underwent cognitive fusion biopsy which demonstrated some prostatic asymmetry, left greater than right.  Several additional samples of the left area of interest were biopsied but combined with the usual 12 core biopsy.  No complications. Pathology was benign.    In terms of urinary symptoms, he is doing extremely well.  He has no complaints.  He very rarely will have some urgency with near episode of urge incontinence but this is few and far between.   Prostate Specific Ag, Serum  Latest Ref Rng 0.0 - 4.0 ng/mL  09/19/2017 0.9   03/28/2018 2.0   10/23/2018 4.8 (H)   11/20/2018 4.9 (H)   03/25/2019 4.8 (H)   07/07/2019 2.9   01/27/2020 2.6   08/03/2020 2.9   11/29/2020 10.5 (H)   12/22/2020 2.4   06/27/2021 2.8   12/29/2021 2.1     Legend: (H) High  PMH: Past Medical History:  Diagnosis Date   Diabetes mellitus  without complication ([place])    ED (erectile dysfunction)    Hypertension    Motion sickness    boats   Neuropathy    Prostate enlargement    Urinary retention     Surgical History: Past Surgical History:  Procedure Laterality Date   BASAL CELL CARCINOMA EXCISION     nose    CATARACT EXTRACTION Bilateral 1986, 2014   COLONOSCOPY WITH PROPOFOL N/A 03/27/2018   Procedure: COLONOSCOPY WITH BIOPSIES;  Surgeon: Lin Landsman, MD;  Location: [facility];  Service: Endoscopy;  Laterality: N/A;  Diabetic - oral meds   EYE SURGERY Bilateral [phone]   FRACTURE SURGERY Right    as a child/ right arm   HERNIA REPAIR Right 01-13-14   inguinal hernia   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 N/A 02/07/2017   Procedure: 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;  Surgeon: Hollice Espy, MD;  Location: [facility];  Service: Urology;  Laterality: N/A;   POLYPECTOMY N/A 03/27/2018   Procedure: POLYPECTOMY;  Surgeon: Lin Landsman, MD;  Location: [facility];  Service: Endoscopy;  Laterality: N/A;   RETINAL DETACHMENT SURGERY  Jan 2015   TONSILLECTOMY  1955    Home Medications:  Allergies as of 01/11/2022       Reactions   Acetaminophen Swelling, Other (See Comments)   Swelling of liver    Amlodipine Swelling  Medication List        Accurate as of January 11, 2022 11:59 PM. If you have any questions, ask your nurse or doctor.          atenolol 50 MG tablet Commonly known as: TENORMIN Take 1 tablet (50 mg total) by mouth daily.   benazepril 40 MG tablet Commonly known as: LOTENSIN Take 1 tablet (40 mg total) by mouth daily.   cyanocobalamin 1000 MCG tablet Commonly known as: VITAMIN B12 Take 1,000 mcg by mouth daily.   metFORMIN 500 MG tablet Commonly known as: GLUCOPHAGE Take 1 tablet (500 mg total) by mouth 3 (three) times daily.   rosuvastatin 10 MG tablet Commonly known as: Crestor Take one tablet  (10 MG) by mouth three days a week.   terbinafine 250 MG tablet Commonly known as: LamISIL Take 1 tablet (250 mg total) by mouth daily. Tinea pedis   TURMERIC PO Take 1 capsule by mouth daily.   VITAMIN D PO Take 500 Units by mouth daily.        Allergies:  Allergies  Allergen Reactions   Acetaminophen Swelling and Other (See Comments)    Swelling of liver    Amlodipine Swelling    Family History: Family History  Problem Relation Age of Onset   Colon cancer Father    Angina Father    Macular degeneration Father    Alzheimer's disease Mother    Prostate cancer Neg Hx    Bladder Cancer Neg Hx    Kidney cancer Neg Hx     Social History:  reports that he quit smoking about 37 years ago. His smoking use included cigarettes. He has a 13.00 pack-year smoking history. He has never used smokeless tobacco. He reports current alcohol use of about 2.0 standard drinks of alcohol per week. He reports that he does not use drugs.   Physical Exam: BP (!) 156/84   Pulse 71   Ht '6\' 1"'$  (1.854 m)   Wt 182 lb (82.6 kg)   BMI 24.01 kg/m   Constitutional:  Alert and oriented, No acute distress. HEENT: [place] AT, moist mucus membranes.  Trachea midline, no masses. Cardiovascular: No clubbing, cyanosis, or edema. Respiratory: Normal respiratory effort, no increased work of breathing. Rectal exam: Enlarged prostate, nontender, no nodules.  Skin: No rashes, bruises or suspicious lesions. Neurologic: Grossly intact, no focal deficits, moving all 4 extremities. Psychiatric: Normal mood and affect.  Laboratory Data: Lab Results  Component Value Date   WBC 5.5 11/30/2021   HGB 14.1 11/30/2021   HCT 41.6 11/30/2021   MCV 85 11/30/2021   PLT 222 11/30/2021    Lab Results  Component Value Date   CREATININE 0.72 (L) 11/30/2021    Lab Results  Component Value Date   HGBA1C 6.4 (H) 11/30/2021    Assessment & Plan:    1. Prostate cancer ([place]) PSA is stable  No indication for  further evaluation, continue active surveillance to which he is agreeable  Recommend PSA every 6 months, rectal exam annually  2. Benign prostatic hyperplasia with urinary frequency Well controlled symptoms s/p HoLEP   Return in about 1 year (around 01/12/2023) for psa in 6 months andd 1 year  follow follow up  dre psa .  Hollice Espy, MD  [facility] [address] [place], [place] [address] [phone]

## 2022-03-01 ENCOUNTER — Other Ambulatory Visit: Payer: Self-pay | Admitting: Nurse Practitioner

## 2022-03-01 NOTE — Telephone Encounter (Signed)
Future visit in 3 months.  Requested Prescriptions  Pending Prescriptions Disp Refills   rosuvastatin (CRESTOR) 10 MG tablet [Pharmacy Med Name: ROSUVASTATIN CALCIUM 10 MG TAB] 12 tablet 4    Sig: TAKE ONE (1) TABLET BY MOUTH THREE TIMESPER WEEK     Cardiovascular:  Antilipid - Statins 2 Failed - 03/01/2022 12:36 PM      Failed - Cr in normal range and within 360 days    Creatinine  Date Value Ref Range Status  01/05/2014 0.75 0.60 - 1.30 mg/dL Final   Creatinine, Ser  Date Value Ref Range Status  11/30/2021 0.72 (L) 0.76 - 1.27 mg/dL Final         Failed - Lipid Panel in normal range within the last 12 months    Cholesterol, Total  Date Value Ref Range Status  11/30/2021 214 (H) 100 - 199 mg/dL Final   Cholesterol Piccolo, Waived  Date Value Ref Range Status  07/30/2018 131 <200 mg/dL Final    Comment:                            Desirable                <200                         Borderline High      200- 239                         High                     >239    LDL Chol Calc (NIH)  Date Value Ref Range Status  11/30/2021 134 (H) 0 - 99 mg/dL Final   HDL  Date Value Ref Range Status  11/30/2021 44 >39 mg/dL Final   Triglycerides  Date Value Ref Range Status  11/30/2021 199 (H) 0 - 149 mg/dL Final   Triglycerides Piccolo,Waived  Date Value Ref Range Status  07/30/2018 121 <150 mg/dL Final    Comment:                            Normal                   <150                         Borderline High     150 - 199                         High                200 - 499                         Very High                >499          Passed - Patient is not pregnant      Passed - Valid encounter within last 12 months    Recent Outpatient Visits           3 months ago Type 2 diabetes mellitus with diabetic neuropathy, without long-term current use of insulin ([place])   [facility] Cannady, Jolene T, NP   9 months ago Type 2 diabetes mellitus with  diabetic neuropathy, without long-term current use of insulin ([place])   [place], [place] T, NP   1 year ago Acute cough   [place], Lauren A, NP   1 year ago Type 2 diabetes mellitus with diabetic neuropathy, without long-term current use of insulin ([place])   [place], [place] T, NP   1 year ago COVID-19 virus RNA test result positive at limit of detection   [facility] Venita Lick, NP       Future Appointments             In 5 days Ralene Bathe, MD [place]   In 3 months Stevens, Barbaraann Faster, NP [employer], [place]   In 10 months Hollice Espy, MD [place]

## 2022-03-06 ENCOUNTER — Ambulatory Visit: Payer: Medicare Other | Admitting: Dermatology

## 2022-03-06 VITALS — BP 133/77 | HR 76

## 2022-03-06 DIAGNOSIS — B351 Tinea unguium: Secondary | ICD-10-CM

## 2022-03-06 DIAGNOSIS — B353 Tinea pedis: Secondary | ICD-10-CM | POA: Diagnosis not present

## 2022-03-06 DIAGNOSIS — L230 Allergic contact dermatitis due to metals: Secondary | ICD-10-CM | POA: Diagnosis not present

## 2022-03-06 DIAGNOSIS — Z79899 Other long term (current) drug therapy: Secondary | ICD-10-CM | POA: Diagnosis not present

## 2022-03-06 MED ORDER — TERBINAFINE HCL 250 MG PO TABS: ORAL_TABLET | ORAL | 1 refills | Status: DC

## 2022-03-06 NOTE — Patient Instructions (Signed)
Due to recent changes in healthcare laws, you may see results of your pathology and/or laboratory studies on MyChart before the doctors have had a chance to review them. We understand that in some cases there may be results that are confusing or concerning to you. Please understand that not all results are received at the same time and often the doctors may need to interpret multiple results in order to provide you with the best plan of care or course of treatment. Therefore, we ask that you please give us 2 business days to thoroughly review all your results before contacting the office for clarification. Should we see a critical lab result, you will be contacted sooner.   If You Need Anything After Your Visit  If you have any questions or concerns for your doctor, please call our main line at [phone] and press option 4 to reach your doctor's medical assistant. If no one answers, please leave a voicemail as directed and we will return your call as soon as possible. Messages left after 4 pm will be answered the following business day.   You may also send us a message via MyChart. We typically respond to MyChart messages within 1-2 business days.  For prescription refills, please ask your pharmacy to contact our office. Our fax number is [phone].  If you have an urgent issue when the clinic is closed that cannot wait until the next business day, you can page your doctor at the number below.    Please note that while we do our best to be available for urgent issues outside of office hours, we are not available 24/7.   If you have an urgent issue and are unable to reach us, you may choose to seek medical care at your doctor's office, retail clinic, urgent care center, or emergency room.  If you have a medical emergency, please immediately call 911 or go to the emergency department.  Pager Numbers  - Dr. Kowalski: [phone]  - Dr. Moye: [phone]  - Dr. Stewart:  [phone]  In the event of inclement weather, please call our main line at [phone] for an update on the status of any delays or closures.  Dermatology Medication Tips: Please keep the boxes that topical medications come in in order to help keep track of the instructions about where and how to use these. Pharmacies typically print the medication instructions only on the boxes and not directly on the medication tubes.   If your medication is too expensive, please contact our office at [phone] option 4 or send us a message through MyChart.   We are unable to tell what your co-pay for medications will be in advance as this is different depending on your insurance coverage. However, we may be able to find a substitute medication at lower cost or fill out paperwork to get insurance to cover a needed medication.   If a prior authorization is required to get your medication covered by your insurance company, please allow us 1-2 business days to complete this process.  Drug prices often vary depending on where the prescription is filled and some pharmacies may offer cheaper prices.  The website www.goodrx.com contains coupons for medications through different pharmacies. The prices here do not account for what the cost may be with help from insurance (it may be cheaper with your insurance), but the website can give you the price if you did not use any insurance.  - You can print the associated coupon and take it with   your prescription to the pharmacy.  - You may also stop by our office during regular business hours and pick up a GoodRx coupon card.  - If you need your prescription sent electronically to a different pharmacy, notify our office through [facility] MyChart or by phone at [phone] option 4.     Si Usted Necesita Algo Despus de Su Visita  Tambin puede enviarnos un mensaje a travs de MyChart. Por lo general respondemos a los mensajes de MyChart en el transcurso de 1 a 2  das hbiles.  Para renovar recetas, por favor pida a su farmacia que se ponga en contacto con nuestra oficina. Nuestro nmero de fax es el [phone].  Si tiene un asunto urgente cuando la clnica est cerrada y que no puede esperar hasta el siguiente da hbil, puede llamar/localizar a su doctor(a) al nmero que aparece a continuacin.   Por favor, tenga en cuenta que aunque hacemos todo lo posible para estar disponibles para asuntos urgentes fuera del horario de oficina, no estamos disponibles las 24 horas del da, los 7 das de la semana.   Si tiene un problema urgente y no puede comunicarse con nosotros, puede optar por buscar atencin mdica  en el consultorio de su doctor(a), en una clnica privada, en un centro de atencin urgente o en una sala de emergencias.  Si tiene una emergencia mdica, por favor llame inmediatamente al 911 o vaya a la sala de emergencias.  Nmeros de bper  - Dr. Kowalski: [phone]  - Dra. Moye: [phone]  - Dra. Stewart: [phone]  En caso de inclemencias del tiempo, por favor llame a nuestra lnea principal al [phone] para una actualizacin sobre el estado de cualquier retraso o cierre.  Consejos para la medicacin en dermatologa: Por favor, guarde las cajas en las que vienen los medicamentos de uso tpico para ayudarle a seguir las instrucciones sobre dnde y cmo usarlos. Las farmacias generalmente imprimen las instrucciones del medicamento slo en las cajas y no directamente en los tubos del medicamento.   Si su medicamento es muy caro, por favor, pngase en contacto con nuestra oficina llamando al [phone] y presione la opcin 4 o envenos un mensaje a travs de MyChart.   No podemos decirle cul ser su copago por los medicamentos por adelantado ya que esto es diferente dependiendo de la cobertura de su seguro. Sin embargo, es posible que podamos encontrar un medicamento sustituto a menor costo o llenar un formulario para que el  seguro cubra el medicamento que se considera necesario.   Si se requiere una autorizacin previa para que su compaa de seguros cubra su medicamento, por favor permtanos de 1 a 2 das hbiles para completar este proceso.  Los precios de los medicamentos varan con frecuencia dependiendo del lugar de dnde se surte la receta y alguna farmacias pueden ofrecer precios ms baratos.  El sitio web www.goodrx.com tiene cupones para medicamentos de diferentes farmacias. Los precios aqu no tienen en cuenta lo que podra costar con la ayuda del seguro (puede ser ms barato con su seguro), pero el sitio web puede darle el precio si no utiliz ningn seguro.  - Puede imprimir el cupn correspondiente y llevarlo con su receta a la farmacia.  - Tambin puede pasar por nuestra oficina durante el horario de atencin regular y recoger una tarjeta de cupones de GoodRx.  - Si necesita que su receta se enve electrnicamente a una farmacia diferente, informe a nuestra oficina a travs de MyChart de [place]   o por telfono llamando al [phone] y presione la opcin 4.  

## 2022-03-06 NOTE — Progress Notes (Signed)
   Follow-Up Visit   Subjective  Tyler Morgan is a 77 y.o. male who presents for the following: Tinea pedis and unguium (S/P Lamisil 250 mg po QD x 1 mth - improved, but persistent). Hx of rash on the leg where his keys sit in his pocket. Patient reports not being able to wear rings either.   The following portions of the chart were reviewed this encounter and updated as appropriate:   Tobacco  Allergies  Meds  Problems  Med Hx  Surg Hx  Fam Hx     Review of Systems:  No other skin or systemic complaints except as noted in HPI or Assessment and Plan.  Objective  Well appearing patient in no apparent distress; mood and affect are within normal limits.  A focused examination was performed including the face and feet. Relevant physical exam findings are noted in the Assessment and Plan.  B/L foot and toenails Diffuse thinning of hair at vertex or temples. Scale and toenail dystrophy.             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 Condition is symptomatic / bothersome to patient. Not to goal.  Continue Lamisil 250 mg po QD x 2 more months. Terbinafine Counseling  Terbinafine is an anti-fungal medicine that can be applied to the skin (over the counter) or taken by mouth (prescription) to treat fungal infections. The pill version is often used to treat fungal infections of the nails or scalp. While most people do not have any side effects from taking terbinafine pills, some possible side effects of the medicine can include taste changes, headache, loss of smell, vision changes, nausea, vomiting, or diarrhea.   Rare side effects can include irritation of the liver, allergic reaction, or decrease in blood counts (which may show up as not feeling well or developing an infection). If you are concerned about any of these side  effects, please stop the medicine and call your doctor, or in the case of an emergency such as feeling very unwell, seek immediate medical care.  Tinea pedis of both feet  Related Medications terbinafine (LAMISIL) 250 MG tablet Take one tab po QD.  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 Consistent with nickel allergy Avoid contact with Nickel products.  Return in about 6 months (around 09/04/2022) for recheck tinea S/P Lamisil .  Luther Redo, CMA, am acting as scribe for Sarina Ser, MD . Documentation: I have reviewed the above documentation for accuracy and completeness, and I agree with the above.  Sarina Ser, MD

## 2022-03-08 ENCOUNTER — Encounter: Payer: Self-pay | Admitting: Dermatology

## 2022-03-09 ENCOUNTER — Ambulatory Visit: Payer: Medicare Other | Admitting: Dermatology

## 2022-04-05 ENCOUNTER — Other Ambulatory Visit: Payer: Self-pay | Admitting: Dermatology

## 2022-04-05 DIAGNOSIS — B353 Tinea pedis: Secondary | ICD-10-CM

## 2022-05-28 NOTE — Patient Instructions (Signed)
Be Involved in Your Health Care:  Taking Medications When medications are taken as directed, they can greatly improve your health. But if they are not taken as instructed, they may not work. In some cases, not taking them correctly can be harmful. To help ensure your treatment remains effective and safe, understand your medications and how to take them.  Your lab results, notes and after visit summary will be available on My Chart. We strongly encourage you to use this feature. If lab results are abnormal the clinic will contact you with the appropriate steps. If the clinic does not contact you assume the results are satisfactory. You can always see your results on My Chart. If you have questions regarding your condition, please contact the clinic during office hours. You can also ask questions on My Chart.  We at [facility] are grateful that you chose us to provide care. We strive to provide excellent and compassionate care and are always looking for feedback. If you get a survey from the clinic please complete this.   Diabetes Mellitus Basics  Diabetes mellitus, or diabetes, is a long-term (chronic) disease. It occurs when the body does not properly use sugar (glucose) that is released from food after you eat. Diabetes mellitus may be caused by one or both of these problems: Your pancreas does not make enough of a hormone called insulin. Your body does not react in a normal way to the insulin that it makes. Insulin lets glucose enter cells in your body. This gives you energy. If you have diabetes, glucose cannot get into cells. This causes high blood glucose (hyperglycemia). How to treat and manage diabetes You may need to take insulin or other diabetes medicines daily to keep your glucose in balance. If you are prescribed insulin, you will learn how to give yourself insulin by injection. You may need to adjust the amount of insulin you take based on the foods that you eat. You will  need to check your blood glucose levels using a glucose monitor as told by your health care provider. The readings can help determine if you have low or high blood glucose. Generally, you should have these blood glucose levels: Before meals (preprandial): 80-130 mg/dL (4.4-7.2 mmol/L). After meals (postprandial): below 180 mg/dL (10 mmol/L). Hemoglobin A1c (HbA1c) level: less than 7%. Your health care provider will set treatment goals for you. Keep all follow-up visits. This is important. Follow these instructions at home: Diabetes medicines Take your diabetes medicines every day as told by your health care provider. List your diabetes medicines here: Name of medicine: ______________________________ Amount (dose): _______________ Time (a.m./p.m.): _______________ Notes: ___________________________________ Name of medicine: ______________________________ Amount (dose): _______________ Time (a.m./p.m.): _______________ Notes: ___________________________________ Name of medicine: ______________________________ Amount (dose): _______________ Time (a.m./p.m.): _______________ Notes: ___________________________________ Insulin If you use insulin, list the types of insulin you use here: Insulin type: ______________________________ Amount (dose): _______________ Time (a.m./p.m.): _______________Notes: ___________________________________ Insulin type: ______________________________ Amount (dose): _______________ Time (a.m./p.m.): _______________ Notes: ___________________________________ Insulin type: ______________________________ Amount (dose): _______________ Time (a.m./p.m.): _______________ Notes: ___________________________________ Insulin type: ______________________________ Amount (dose): _______________ Time (a.m./p.m.): _______________ Notes: ___________________________________ Insulin type: ______________________________ Amount (dose): _______________ Time (a.m./p.m.): _______________  Notes: ___________________________________ Managing blood glucose  Check your blood glucose levels using a glucose monitor as told by your health care provider. Write down the times that you check your glucose levels here: Time: _______________ Notes: ___________________________________ Time: _______________ Notes: ___________________________________ Time: _______________ Notes: ___________________________________ Time: _______________ Notes: ___________________________________ Time: _______________ Notes: ___________________________________ Time: _______________ Notes: ___________________________________    Low blood glucose Low blood glucose (hypoglycemia) is when glucose is at or below 70 mg/dL (3.9 mmol/L). Symptoms may include: Feeling: Hungry. Sweaty and clammy. Irritable or easily upset. Dizzy. Sleepy. Having: A fast heartbeat. A headache. A change in your vision. Numbness around the mouth, lips, or tongue. Having trouble with: Moving (coordination). Sleeping. Treating low blood glucose To treat low blood glucose, eat or drink something containing sugar right away. If you can think clearly and swallow safely, follow the 15:15 rule: Take 15 grams of a fast-acting carb (carbohydrate), as told by your health care provider. Some fast-acting carbs are: Glucose tablets: take 3-4 tablets. Hard candy: eat 3-5 pieces. Fruit juice: drink 4 oz (120 mL). Regular (not diet) soda: drink 4-6 oz (120-180 mL). Honey or sugar: eat 1 Tbsp (15 mL). Check your blood glucose levels 15 minutes after you take the carb. If your glucose is still at or below 70 mg/dL (3.9 mmol/L), take 15 grams of a carb again. If your glucose does not go above 70 mg/dL (3.9 mmol/L) after 3 tries, get help right away. After your glucose goes back to normal, eat a meal or a snack within 1 hour. Treating very low blood glucose If your glucose is at or below 54 mg/dL (3 mmol/L), you have very low blood glucose  (severe hypoglycemia). This is an emergency. Do not wait to see if the symptoms will go away. Get medical help right away. Call your local emergency services (911 in the [place] Do not drive yourself to the hospital. Questions to ask your health care provider Should I talk with a diabetes educator? What equipment will I need to care for myself at home? What diabetes medicines do I need? When should I take them? How often do I need to check my blood glucose levels? What number can I call if I have questions? When is my follow-up visit? Where can I find a support group for people with diabetes? Where to find more information American Diabetes Association: www.diabetes.org Association of Diabetes Care and Education Specialists: www.diabeteseducator.org Contact a health care provider if: Your blood glucose is at or above 240 mg/dL (13.3 mmol/L) for 2 days in a row. You have been sick or have had a fever for 2 days or more, and you are not getting better. You have any of these problems for more than 6 hours: You cannot eat or drink. You feel nauseous. You vomit. You have diarrhea. Get help right away if: Your blood glucose is lower than 54 mg/dL (3 mmol/L). You get confused. You have trouble thinking clearly. You have trouble breathing. These symptoms may represent a serious problem that is an emergency. Do not wait to see if the symptoms will go away. Get medical help right away. Call your local emergency services (911 in the [place] Do not drive yourself to the hospital. Summary Diabetes mellitus is a chronic disease that occurs when the body does not properly use sugar (glucose) that is released from food after you eat. Take insulin and diabetes medicines as told. Check your blood glucose every day, as often as told. Keep all follow-up visits. This is important. This information is not intended to replace advice given to you by your health care provider. Make sure you discuss any  questions you have with your health care provider. Document Revised: 06/10/2019 Document Reviewed: 06/10/2019 Elsevier Patient Education  2023 Elsevier Inc.  

## 2022-06-02 ENCOUNTER — Ambulatory Visit (INDEPENDENT_AMBULATORY_CARE_PROVIDER_SITE_OTHER): Payer: Medicare Other | Admitting: Nurse Practitioner

## 2022-06-02 ENCOUNTER — Encounter: Payer: Self-pay | Admitting: Nurse Practitioner

## 2022-06-02 VITALS — BP 140/80 | HR 74 | Temp 97.6°F | Ht 65.98 in | Wt 189.9 lb

## 2022-06-02 DIAGNOSIS — E1159 Type 2 diabetes mellitus with other circulatory complications: Secondary | ICD-10-CM

## 2022-06-02 DIAGNOSIS — E1140 Type 2 diabetes mellitus with diabetic neuropathy, unspecified: Secondary | ICD-10-CM | POA: Diagnosis not present

## 2022-06-02 DIAGNOSIS — E1169 Type 2 diabetes mellitus with other specified complication: Secondary | ICD-10-CM

## 2022-06-02 DIAGNOSIS — E785 Hyperlipidemia, unspecified: Secondary | ICD-10-CM | POA: Diagnosis not present

## 2022-06-02 DIAGNOSIS — I152 Hypertension secondary to endocrine disorders: Secondary | ICD-10-CM

## 2022-06-02 DIAGNOSIS — I739 Peripheral vascular disease, unspecified: Secondary | ICD-10-CM | POA: Diagnosis not present

## 2022-06-02 DIAGNOSIS — Z8546 Personal history of malignant neoplasm of prostate: Secondary | ICD-10-CM

## 2022-06-02 LAB — BAYER DCA HB A1C WAIVED: HB A1C (BAYER DCA - WAIVED): 7.7 % — ABNORMAL HIGH (ref 4.8–5.6)

## 2022-06-02 LAB — MICROALBUMIN, URINE WAIVED
Creatinine, Urine Waived: 50 mg/dL (ref 10–300)
Microalb, Ur Waived: 30 mg/L — ABNORMAL HIGH (ref 0–19)

## 2022-06-02 MED ORDER — LIDOCAINE 5 % EX PTCH: 1.0000 | MEDICATED_PATCH | CUTANEOUS | 0 refills | Status: DC

## 2022-06-02 NOTE — Assessment & Plan Note (Signed)
Chronic, ongoing.  Myalgia presenting with daily Atorvastatin in past.  Continue Rosuvastatin 10 MG three days a week, educated him on this.  If myalgia with 3 days a week dosing then will trial one day a week, if ongoing myalgia then stop and consider Zetia or injectable.

## 2022-06-02 NOTE — Assessment & Plan Note (Signed)
Chronic, ongoing with A1c trending up today at 7.7%, urine ALB 30 in April 2024.  Continue Metformin TID and recommend heavy diet focus -- may need to restart Jardiance if ongoing elevations next visit.  Recommend he check BS at least three mornings a week and document.  If elevation above goal A1c would consider increasing Metformin to max or adding SGLT.  Discussed with him medications we can use for neuropathy or consider PT in future for strengthening and fall prevention -- at this time place PT referral which may offer benefit.  Return in 3 months for A1c check due to trend up.

## 2022-06-02 NOTE — Assessment & Plan Note (Signed)
Recent visit with Dr. Brandon and is cancer free.  Continue collaboration with urology and PSA with them. 

## 2022-06-02 NOTE — Assessment & Plan Note (Signed)
Chronic, ongoing with BP elevated today due to increase Ibuprofen use.  Recommend he cut back on this and Naprosyn, instead use Lidocaine patches as needed that were sent in.  Urine ALB 20 June 2022.  Continue current medication regimen at this time, Benazepril offering kidney protection.  Recommend checking BP a few mornings a week at home and documenting + focus on DASH diet.  Monitor for worsening edema and may provide PRN Lasix if presents, suspect some underlyin PVD based on exam.  Recommend avoid Ibuprofen products and high salt intake.  Return in 3 months.  Labs today: CMP.

## 2022-06-02 NOTE — Progress Notes (Signed)
BP (!) 140/80 (BP Location: Left Arm, Patient Position: Sitting, Cuff Size: Normal)   Pulse 74   Temp 97.6 F (36.4 C) (Oral)   Ht 5' 5.98" (1.676 m)   Wt 189 lb 14.4 oz (86.1 kg)   SpO2 98%   BMI 30.67 kg/m    Subjective:    Patient ID: Tyler Morgan, male    DOB: Jul 14, 1945, 77 y.o.   MRN: [id number]  HPI: Tyler Morgan is a 77 y.o. male  Chief Complaint  Patient presents with   Diabetes   Hypertension   Hyperlipidemia   H/O Prostate Ca   DIABETES A1c in October was 6.4%, remaining stable.  Continues Metformin 500 MG TID, + focus on diet changes.  Was on Jardiance in past, on for less then a year.  He does endorse over past months did get into bad diet habits.  Weakness in ankles and legs seems to be getting worse lately with neuropathy.  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  Hypoglycemic episodes:no Polydipsia/polyuria: no Visual disturbance: no Chest pain: no Paresthesias: no Glucose Monitoring: no  Accucheck frequency: Not Checking  Fasting glucose:  Post prandial:  Evening:  Before meals: Taking Insulin?: no  Long acting insulin:  Short acting insulin: Blood Pressure Monitoring: rarely Retinal Examination: Up To Date - [facility] Foot Exam: Up to Date Pneumovax: Up to Date Influenza: Up to Date Aspirin: no   HYPERTENSION / HYPERLIPIDEMIA Continues on Benazepril, Atenolol, + Crestor.  Has been taking Ibuprofen recently for nagging pain in left shoulder, moving lots of timber in yard which is causing some discomfort.  History of injury to this arm in past.   Satisfied with current treatment? yes Duration of hypertension: chronic BP monitoring frequency: not checking BP range:  BP medication side effects: no Duration of hyperlipidemia: chronic Cholesterol medication side effects: no Cholesterol supplements:  none Medication compliance: good compliance Aspirin: no Recent stressors: no Recurrent headaches: no Visual changes: no Palpitations: no Dyspnea: no Chest pain: no Lower extremity edema: no Dizzy/lightheaded: no   PROSTATE CANCER: Followed by Dr. Apolinar Junes with urology and last saw 01/11/22, was told he was cancer free in 2021.  Continues to have monitoring of PSA, every 6 months, recent November 2023 was 2.1. Denies any symptoms at this time.  His brother just passed from abdominal cancer, was a heavier smoker.  Father had colon cancer.    Relevant past medical, surgical, family and social history reviewed and updated as indicated. Interim medical history since our last visit reviewed. Allergies and medications reviewed and updated.  Review of Systems  Constitutional:  Negative for activity change, diaphoresis, fatigue and fever.  Respiratory:  Negative for cough, chest tightness, shortness of breath and wheezing.   Cardiovascular:  Negative for chest pain, palpitations and leg swelling.  Gastrointestinal: Negative.   Endocrine: Negative for cold intolerance, heat intolerance, polydipsia, polyphagia and polyuria.  Neurological: Negative.   Psychiatric/Behavioral: Negative.      Per HPI unless specifically indicated above     Objective:    BP (!) 140/80 (BP Location: Left Arm, Patient Position: Sitting, Cuff Size: Normal)   Pulse 74   Temp 97.6 F (36.4 C) (Oral)   Ht 5' 5.98" (1.676 m)   Wt 189 lb 14.4 oz (86.1 kg)   SpO2 98%   BMI 30.67 kg/m   Wt Readings from Last 3  Encounters:  06/02/22 189 lb 14.4 oz (86.1 kg)  01/11/22 182 lb (82.6 kg)  11/30/21 184 lb 1.6 oz (83.5 kg)    Physical Exam Vitals and nursing note reviewed.  Constitutional:      General: He is awake. He is not in acute distress.    Appearance: He is well-developed and well-groomed. He is obese. He is not ill-appearing or toxic-appearing.  HENT:     Head: Normocephalic and atraumatic.     Right  Ear: Hearing normal. No drainage.     Left Ear: Hearing normal. No drainage.  Eyes:     General: Lids are normal.        Right eye: No discharge.        Left eye: No discharge.     Conjunctiva/sclera: Conjunctivae normal.     Pupils: Pupils are equal, round, and reactive to light.  Neck:     Thyroid: No thyromegaly.     Vascular: No carotid bruit.  Cardiovascular:     Rate and Rhythm: Normal rate and regular rhythm.     Heart sounds: Normal heart sounds, S1 normal and S2 normal. No murmur heard.    No gallop.  Pulmonary:     Effort: Pulmonary effort is normal. No accessory muscle usage or respiratory distress.     Breath sounds: Normal breath sounds.  Abdominal:     General: Bowel sounds are normal.     Palpations: Abdomen is soft.  Musculoskeletal:        General: Normal range of motion.     Cervical back: Normal range of motion and neck supple.     Right lower leg: No edema.     Left lower leg: No edema.  Skin:    General: Skin is warm and dry.  Neurological:     Mental Status: He is alert and oriented to person, place, and time.     Deep Tendon Reflexes: Reflexes are normal and symmetric.  Psychiatric:        Attention and Perception: Attention normal.        Mood and Affect: Mood normal.        Speech: Speech normal.        Behavior: Behavior normal. Behavior is cooperative.        Thought Content: Thought content normal.    Results for orders placed or performed in visit on 06/02/22  Bayer DCA Hb A1c Waived  Result Value Ref Range   HB A1C (BAYER DCA - WAIVED) 7.7 (H) 4.8 - 5.6 %  Microalbumin, Urine Waived  Result Value Ref Range   Microalb, Ur Waived 30 (H) 0 - 19 mg/L   Creatinine, Urine Waived 50 10 - 300 mg/dL   Microalb/Creat Ratio 30-300 (H) <30 mg/g      Assessment & Plan:   Problem List Items Addressed This Visit       Cardiovascular and Mediastinum   Hypertension associated with diabetes    Chronic, ongoing with BP elevated today due to increase  Ibuprofen use.  Recommend he cut back on this and Naprosyn, instead use Lidocaine patches as needed that were sent in.  Urine ALB 20 June 2022.  Continue current medication regimen at this time, Benazepril offering kidney protection.  Recommend checking BP a few mornings a week at home and documenting + focus on DASH diet.  Monitor for worsening edema and may provide PRN Lasix if presents, suspect some underlyin PVD based on exam.  Recommend avoid Ibuprofen products and high  salt intake.  Return in 3 months.  Labs today: CMP.      Relevant Orders   Bayer DCA Hb A1c Waived (Completed)   Microalbumin, Urine Waived (Completed)   Comprehensive metabolic panel   PVD (peripheral vascular disease)    Suspect some underlying PVD based on exam with diminished lower extremity hair pattern, edema at ankles, and dry skin.  At this time monitor for wounds and continue frequent foot exams.  To notify provider if any wounds present or pain.      Relevant Orders   Comprehensive metabolic panel   Lipid Panel w/o Chol/HDL Ratio     Endocrine   Hyperlipidemia associated with type 2 diabetes mellitus    Chronic, ongoing.  Myalgia presenting with daily Atorvastatin in past.  Continue Rosuvastatin 10 MG three days a week, educated him on this.  If myalgia with 3 days a week dosing then will trial one day a week, if ongoing myalgia then stop and consider Zetia or injectable.      Relevant Orders   Bayer DCA Hb A1c Waived (Completed)   Comprehensive metabolic panel   Lipid Panel w/o Chol/HDL Ratio   Type 2 diabetes mellitus with diabetic neuropathy - Primary    Chronic, ongoing with A1c trending up today at 7.7%, urine ALB 30 in April 2024.  Continue Metformin TID and recommend heavy diet focus -- may need to restart Jardiance if ongoing elevations next visit.  Recommend he check BS at least three mornings a week and document.  If elevation above goal A1c would consider increasing Metformin to max or adding SGLT.   Discussed with him medications we can use for neuropathy or consider PT in future for strengthening and fall prevention -- at this time place PT referral which may offer benefit.  Return in 3 months for A1c check due to trend up.      Relevant Orders   Bayer DCA Hb A1c Waived (Completed)   Microalbumin, Urine Waived (Completed)   Ambulatory referral to Physical Therapy     Other   History of prostate cancer    Recent visit with Dr. Apolinar Junes and is cancer free.  Continue collaboration with urology and PSA with them.        Follow up plan: Return in about 3 months (around 09/01/2022) for T2DM, HTN/HLD.

## 2022-06-02 NOTE — Assessment & Plan Note (Signed)
Suspect some underlying PVD based on exam with diminished lower extremity hair pattern, edema at ankles, and dry skin.  At this time monitor for wounds and continue frequent foot exams.  To notify provider if any wounds present or pain. 

## 2022-06-03 LAB — COMPREHENSIVE METABOLIC PANEL
ALT: 20 IU/L (ref 0–44)
AST: 22 IU/L (ref 0–40)
Albumin/Globulin Ratio: 2 (ref 1.2–2.2)
Albumin: 4.3 g/dL (ref 3.8–4.8)
Alkaline Phosphatase: 65 IU/L (ref 44–121)
BUN/Creatinine Ratio: 31 — ABNORMAL HIGH (ref 10–24)
BUN: 22 mg/dL (ref 8–27)
Bilirubin Total: 0.4 mg/dL (ref 0.0–1.2)
CO2: 22 mmol/L (ref 20–29)
Calcium: 9.1 mg/dL (ref 8.6–10.2)
Chloride: 105 mmol/L (ref 96–106)
Creatinine, Ser: 0.71 mg/dL — ABNORMAL LOW (ref 0.76–1.27)
Globulin, Total: 2.1 g/dL (ref 1.5–4.5)
Glucose: 240 mg/dL — ABNORMAL HIGH (ref 70–99)
Potassium: 4 mmol/L (ref 3.5–5.2)
Sodium: 142 mmol/L (ref 134–144)
Total Protein: 6.4 g/dL (ref 6.0–8.5)
eGFR: 95 mL/min/{1.73_m2} (ref >59)

## 2022-06-03 LAB — LIPID PANEL W/O CHOL/HDL RATIO
Cholesterol, Total: 143 mg/dL (ref 100–199)
HDL: 47 mg/dL (ref >39)
LDL Chol Calc (NIH): 57 mg/dL (ref 0–99)
Triglycerides: 244 mg/dL — ABNORMAL HIGH (ref 0–149)
VLDL Cholesterol Cal: 39 mg/dL (ref 5–40)

## 2022-06-03 NOTE — Progress Notes (Signed)
Contacted via MyChart   Good afternoon Tyler Morgan, your labs have returned: - Kidney function, creatinine and eGFR, remains normal, as is liver function, AST and ALT.  - Cholesterol labs show normal LDL at goal --- great news!!  Triglycerides are a little elevated, for this focus heavily on diet changes as we discussed.  Any questions? Keep being amazing!!  Thank you for allowing me to participate in your care.  I appreciate you. Kindest regards, Jenavieve Freda

## 2022-06-29 ENCOUNTER — Other Ambulatory Visit: Payer: Self-pay | Admitting: Nurse Practitioner

## 2022-06-29 NOTE — Telephone Encounter (Signed)
Requested Prescriptions  Pending Prescriptions Disp Refills   rosuvastatin (CRESTOR) 10 MG tablet [Pharmacy Med Name: ROSUVASTATIN CALCIUM 10 MG TAB] 12 tablet 4    Sig: TAKE ONE (1) TABLET BY MOUTH THREE TIMESPER WEEK     Cardiovascular:  Antilipid - Statins 2 Failed - 06/29/2022  2:56 PM      Failed - Cr in normal range and within 360 days    Creatinine  Date Value Ref Range Status  01/05/2014 0.75 0.60 - 1.30 mg/dL Final   Creatinine, Ser  Date Value Ref Range Status  06/02/2022 0.71 (L) 0.76 - 1.27 mg/dL Final         Failed - Lipid Panel in normal range within the last 12 months    Cholesterol, Total  Date Value Ref Range Status  06/02/2022 143 100 - 199 mg/dL Final   Cholesterol Piccolo, Waived  Date Value Ref Range Status  07/30/2018 131 <200 mg/dL Final    Comment:                            Desirable                <200                         Borderline High      200- 239                         High                     >239    LDL Chol Calc (NIH)  Date Value Ref Range Status  06/02/2022 57 0 - 99 mg/dL Final   HDL  Date Value Ref Range Status  06/02/2022 47 >39 mg/dL Final   Triglycerides  Date Value Ref Range Status  06/02/2022 244 (H) 0 - 149 mg/dL Final   Triglycerides Piccolo,Waived  Date Value Ref Range Status  07/30/2018 121 <150 mg/dL Final    Comment:                            Normal                   <150                         Borderline High     150 - 199                         High                200 - 499                         Very High                >499          Passed - Patient is not pregnant      Passed - Valid encounter within last 12 months    Recent Outpatient Visits           3 weeks ago Type 2 diabetes mellitus with diabetic neuropathy, without long-term current use of insulin (HCC)   [place] [facility] [place], Dorie Rank, NP  7 months ago Type 2 diabetes mellitus with diabetic neuropathy, without  long-term current use of insulin (HCC)   [place] [facility] [place], Bourbonnais T, NP   1 year ago Type 2 diabetes mellitus with diabetic neuropathy, without long-term current use of insulin (HCC)   [place] [facility] [place], Corrie Dandy T, NP   1 year ago Acute cough   [place] [facility] [place], Lauren A, NP   1 year ago Type 2 diabetes mellitus with diabetic neuropathy, without long-term current use of insulin (HCC)   [place] [facility] [place], Dorie Rank, NP       Future Appointments             In 2 months [place], Dorie Rank, NP [facility]   In 2 months Deirdre Evener, MD [facility]   In 6 months Vanna Scotland, MD [facility]

## 2022-07-10 ENCOUNTER — Other Ambulatory Visit: Payer: Self-pay

## 2022-07-10 DIAGNOSIS — C61 Malignant neoplasm of prostate: Secondary | ICD-10-CM

## 2022-07-10 DIAGNOSIS — N401 Enlarged prostate with lower urinary tract symptoms: Secondary | ICD-10-CM

## 2022-07-11 ENCOUNTER — Other Ambulatory Visit: Payer: Medicare Other

## 2022-07-11 DIAGNOSIS — N401 Enlarged prostate with lower urinary tract symptoms: Secondary | ICD-10-CM

## 2022-07-11 DIAGNOSIS — R350 Frequency of micturition: Secondary | ICD-10-CM | POA: Diagnosis not present

## 2022-07-11 DIAGNOSIS — C61 Malignant neoplasm of prostate: Secondary | ICD-10-CM

## 2022-07-12 LAB — PSA: Prostate Specific Ag, Serum: 1.9 ng/mL (ref 0.0–4.0)

## 2022-07-24 ENCOUNTER — Ambulatory Visit: Payer: Medicare Other | Attending: Nurse Practitioner

## 2022-07-24 DIAGNOSIS — R262 Difficulty in walking, not elsewhere classified: Secondary | ICD-10-CM | POA: Diagnosis not present

## 2022-07-24 DIAGNOSIS — R2681 Unsteadiness on feet: Secondary | ICD-10-CM | POA: Insufficient documentation

## 2022-07-24 DIAGNOSIS — E1140 Type 2 diabetes mellitus with diabetic neuropathy, unspecified: Secondary | ICD-10-CM | POA: Diagnosis not present

## 2022-07-24 DIAGNOSIS — Z9181 History of falling: Secondary | ICD-10-CM | POA: Diagnosis not present

## 2022-07-24 DIAGNOSIS — M6281 Muscle weakness (generalized): Secondary | ICD-10-CM | POA: Insufficient documentation

## 2022-07-24 NOTE — Therapy (Signed)
[facility] [address], [place], [address] Phone: [phone]   Fax:  [phone]  Physical Therapy Evaluation  Patient Details  Name: Tyler Morgan MRN: [id number] Date of Birth: 02-03-1946 Referring Provider (PT): [name], NP   Encounter Date: 07/24/2022   PT End of Session - 07/24/22 1259     Visit Number 1    Number of Visits 17    Date for PT Re-Evaluation 09/21/22    Progress Note Due on Visit 10    PT Start Time 1300    PT Stop Time 1353    PT Time Calculation (min) 53 min    Equipment Utilized During Treatment Gait belt    Activity Tolerance Patient tolerated treatment well    Behavior During Therapy WFL for tasks assessed/performed             Past Medical History:  Diagnosis Date   Diabetes mellitus without complication [facility])    ED (erectile dysfunction)    Hypertension    Motion sickness    boats   Neuropathy    Prostate enlargement    Urinary retention     Past Surgical History:  Procedure Laterality Date   BASAL CELL CARCINOMA EXCISION     nose    CATARACT EXTRACTION Bilateral 1986, 2014   COLONOSCOPY WITH PROPOFOL N/A 03/27/2018   Procedure: COLONOSCOPY WITH BIOPSIES;  Surgeon: Toney Reil, MD;  Location: [facility];  Service: Endoscopy;  Laterality: N/A;  Diabetic - oral meds   EYE SURGERY Bilateral [phone]   FRACTURE SURGERY Right    as a child/ right arm   HERNIA REPAIR Right 01-13-14   inguinal hernia   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 N/A 02/07/2017   Procedure: 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;  Surgeon: Vanna Scotland, MD;  Location: [facility];  Service: Urology;  Laterality: N/A;   POLYPECTOMY N/A 03/27/2018   Procedure: POLYPECTOMY;  Surgeon: Toney Reil, MD;  Location: [facility];  Service: Endoscopy;  Laterality: N/A;   RETINAL DETACHMENT SURGERY  Jan 2015   TONSILLECTOMY   1955    There were no vitals filed for this visit.    Subjective Assessment - 07/24/22 1302     Subjective Low back pain: 0/10 currently and worst in the past 3 months.    Pertinent History Balance, weakness. Can get around well without a cane. Standing still, pt loses balance. Has to hold onto things to stabilize himself. Has labradore thats about 77 year old and can walk his dog without a leash because the dog's pulling stabilizes him. Symptoms have been going on for a while and was told to have diabetic neuropathy. Had a fall in 2015 while working on the flooring in his den at home which caused R inguinal hernia S/P mesh placement. Pt states noticing problems in his legs since 2015. Pt also could not push up on his toes (tip toe) since then. No back pain currently but has had a hx of back pain over the years. Decreased sensation B feet, almost not noticeable. Feels like he has a lot of weakness which makes it difficult to keep his balance. Has B LE swelling, comes and goes, not currently getting treated for it. No low back pain, just L arm and shoulder.    Diagnostic tests Per MRI on 12/26/2016: 1. 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 There is probable chronic bilateral L5 nerve root encroachment.    Patient Stated Goals Be able to get around better. Be able to hold a camera with both hands without losing balance without support.    Currently in Pain? No/denies                [facility] PT Assessment - 07/24/22 1257       Assessment   Medical Diagnosis E11.40 (ICD-10-CM) - Type 2 diabetes mellitus with diabetic neuropathy, without long-term current use of insulin [facility])    Referring Provider (PT) [name], NP    Onset Date/Surgical Date 06/02/22   Date PT referral signed. Chronic condition   Prior Therapy None for current condition      Precautions   Precaution Comments Fall risk; L5  anterolisthesis      Restrictions   Other Position/Activity Restrictions No known restrictions      Balance Screen   Has the patient fallen in the past 6 months Yes    How many times? 4   usually when his dog pulls him suddenly.   Has the patient had a decrease in activity level because of a fear of falling?  No    Is the patient reluctant to leave their home because of a fear of falling?  No      Home Environment   Additional Comments Pt lives in a 1 story home with wife and dog. 1 step to enter home, no rails but has door way for pt to hold onto.      Prior Function   Vocation Full time Designer, industrial/product.     Posture/Postural Control   Posture Comments Posture: B knees in flexion, slight backward lean      AROM   Lumbar Flexion WFL with unsteadiness    Lumbar Extension WFL with unsteadiness    Lumbar - Right Side Bend WFL with unsteadiness    Lumbar - Left Side Bend WFL with unsteadiness    Lumbar - Right Rotation WFL with unsteadiness    Lumbar - Left Rotation WFL with unsteadiness      Strength   Overall Strength Comments Ankle strength measured manually    Right Hip Flexion 4/5    Right Hip Extension 3+/5    Right Hip ABduction 4/5    Left Hip Flexion 4/5    Left Hip Extension 3+/5    Left Hip ABduction 4/5    Right Knee Flexion 4+/5    Right Knee Extension 4+/5    Left Knee Flexion 4+/5    Left Knee Extension 5/5    Right Ankle Dorsiflexion 3+/5    Right Ankle Plantar Flexion 3+/5    Right Ankle Inversion 3+/5    Right Ankle Eversion 4-/5    Left Ankle Dorsiflexion 3/5    Left Ankle Plantar Flexion 3/5    Left Ankle Inversion 3+/5    Left Ankle Eversion 3+/5      Ambulation/Gait   Gait Comments Gait: with SPC on R, backward lean, B pelvic drop during stance phase. Weakness B quadriceps observed.      Dynamic Gait Index   Level Surface Mild Impairment    Change in Gait Speed Moderate Impairment    Gait with Horizontal Head Turns Moderate  Impairment    Gait with Vertical Head Turns Moderate Impairment    Gait and Pivot Turn Mild Impairment    Step Over Obstacle Mild Impairment    Step Around  Obstacles Mild Impairment    Steps Mild Impairment    Total Score 13    DGI comment: < 19 suggests increase fall risk                        Objective measurements completed on examination: See above findings.   Has HTN per pt   Per MRI on 12/26/2016: 1. 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 There is probable chronic bilateral L5 nerve root encroachment.   Blood pressure, L arm sitting, normal cuff: 140/71, HR 64  Has not yet had PT for current condition     Therapeutic exercise  Seated transversus abdominis contraction 2x5 seconds. Reviewed and given as part of his HEP. Pt demonstrated and verbalized understanding. Handout provided.    Improved exercise technique, movement at target joints, use of target muscles after mod verbal, visual, tactile cues.      Response to treatment Pt tolerated session well without aggravation of symptoms.      Clinical impression Pt is a 77 year old male who came to physical therapy secondary to decreased balance, history of falls, and LE weakness. He also presents with weakness of B hips (hip extension > hip flexion > hip abduction), thighs, and especially B ankles (S1, S2), altered gait pattern and posture, a score of 13 for the Dynamic Gait Index suggesting decreased balance and increased fall risk, and difficulty performing standing tasks secondary to LE weakness and balance. Pt also demonstrates anterolisthesis at L5 with "probable chronic L5 nerve root encroachment" based on his 12/26/2016 MRI. Pt will benefit from skilled physical therapy services to address the aforementioned deficits.       Home Exercises  Access Code: [email or web address] URL:  [email or web address] Date: 07/24/2022 Prepared by: Loralyn Freshwater  Exercises - Seated Transversus Abdominis Bracing  - 5 x daily - 7 x weekly - 3 sets - 10 reps - 5 seconds hold              PT Education - 07/24/22 1438     Education Details ther-ex., HEP, POC    Person(s) Educated Patient    Methods Explanation;Demonstration;Verbal cues;Handout    Comprehension Verbalized understanding;Returned demonstration              PT Short Term Goals - 07/24/22 1422       PT SHORT TERM GOAL #1   Title Pt will be independent with his initial HEP to improve strength, balance, and function.    Baseline Pt has started his HEP. (07/24/2022)    Time 3    Period Weeks    Status New    Target Date 08/18/22               PT Long Term Goals - 07/24/22 1423       PT LONG TERM GOAL #1   Title Pt will improve B ankle PF, DF, EV, and IV strength by at least 1/2 MMT grade to promote ability to ambulate, as well as perform standing tasks with less difficulty and decreased fall risk.    Baseline Manually resisted ankle: PF 3+/5 R, and L;  DF 3+/5 R, 3/5 L; EV 4-/5 R, 3+/5 L; IV 3+/5 R and L (07/24/2022)    Time 8    Period Weeks    Status New    Target Date 09/22/22      PT LONG TERM GOAL #2  Title Pt will improve B hip extension and abduction strength to promote ability to perform standing tasks with less difficulty and decreased fall risk.    Baseline Hip extension 3+/5 R and L; hip abduction 4/5 R and L (07/24/2022)    Time 8    Period Weeks    Status New    Target Date 09/22/22      PT LONG TERM GOAL #3   Title Pt will improve his FOTO score by at least 10 points as a demonsration of improved function.    Baseline Foot FOTO 67 (07/24/2022)    Time 8    Period Weeks    Status New    Target Date 09/22/22      PT LONG TERM GOAL #4   Title Pt will improve his DGI score to at least 19 points as a demonstration of improved balance and decreased fall risk.     Baseline DGI 13 (07/24/2022)    Time 8    Period Weeks    Status New    Target Date 09/22/22                    Plan - 07/24/22 1439     Clinical Impression Statement Pt is a 77 year old male who came to physical therapy secondary to decreased balance, history of falls, and LE weakness. He also presents with weakness of B hips (hip extension > hip flexion > hip abduction), thighs, and especially B ankles (S1, S2), altered gait pattern and posture, a score of 13 for the Dynamic Gait Index suggesting decreased balance and increased fall risk, and difficulty performing standing tasks secondary to LE weakness and balance. Pt also demonstrates anterolisthesis at L5 with "probable chronic L5 nerve root encroachment" based on his 12/26/2016 MRI. Pt will benefit from skilled physical therapy services to address the aforementioned deficits.    Personal Factors and Comorbidities Comorbidity 3+;Age;Past/Current Experience;Time since onset of injury/illness/exacerbation;Profession;Fitness    Comorbidities HTN, DM, neuropathy    Examination-Activity Limitations Stand;Lift;Carry;Bend;Locomotion Level;Dressing;Squat;Transfers;Stairs    Stability/Clinical Decision Making Evolving/Moderate complexity   Balance seem to be worsening based on subjective reports.   Clinical Decision Making Moderate    Rehab Potential Fair    PT Frequency 2x / week    PT Duration 8 weeks    PT Treatment/Interventions Therapeutic exercise;Balance training;Functional mobility training;Stair training;Therapeutic activities;Neuromuscular re-education;Patient/family education;Manual techniques;Electrical Stimulation;Gait training    PT Next Visit Plan lumbopelvic strengthening, stability and control, B hip, thigh, and ankle strengthening, balance, gait, manual techniques, modalities PRN.    PT Home Exercise Plan Medbridge Access Code: [email or web address]    Consulted and Agree with Plan of Care Patient             Patient will  benefit from skilled therapeutic intervention in order to improve the following deficits and impairments:  Abnormal gait, Decreased balance, Decreased mobility, Decreased strength, Difficulty walking, Improper body mechanics, Postural dysfunction  Visit Diagnosis: Unsteadiness on feet - Plan: PT plan of care cert/re-cert  Difficulty in walking, not elsewhere classified - Plan: PT plan of care cert/re-cert  Muscle weakness (generalized) - Plan: PT plan of care cert/re-cert  History of falling - Plan: PT plan of care cert/re-cert     Problem List Patient Active Problem List   Diagnosis Date Noted   PVD (peripheral vascular disease) (HCC) 11/30/2021   B12 deficiency 01/29/2019   History of prostate cancer 12/13/2017   Hemorrhoids 12/13/2017   Hyperlipidemia associated with type 2 diabetes  mellitus (HCC) 04/02/2017   Advanced care planning/counseling discussion 10/11/2016   Type 2 diabetes mellitus with diabetic neuropathy (HCC) 12/15/2014   Hypertension associated with diabetes [facility])    Loralyn Freshwater PT, DPT  07/24/2022, 2:56 PM  [facility] [address] [place], [address] Phone: [phone]   Fax:  [phone]  Name: FARZAD AVITABILE MRN: [id number] Date of Birth: 1945/09/16

## 2022-07-26 ENCOUNTER — Ambulatory Visit: Payer: Medicare Other

## 2022-07-26 DIAGNOSIS — Z9181 History of falling: Secondary | ICD-10-CM | POA: Diagnosis not present

## 2022-07-26 DIAGNOSIS — R262 Difficulty in walking, not elsewhere classified: Secondary | ICD-10-CM

## 2022-07-26 DIAGNOSIS — R2681 Unsteadiness on feet: Secondary | ICD-10-CM

## 2022-07-26 DIAGNOSIS — M6281 Muscle weakness (generalized): Secondary | ICD-10-CM

## 2022-07-26 DIAGNOSIS — E1140 Type 2 diabetes mellitus with diabetic neuropathy, unspecified: Secondary | ICD-10-CM | POA: Diagnosis not present

## 2022-07-26 NOTE — Therapy (Signed)
OUTPATIENT PHYSICAL THERAPY TREATMENT NOTE   Patient Name: Tyler Morgan MRN: [id number] DOB:1945-12-04, 77 y.o., male Today's Date: 07/26/2022  PCP: [name], NP  REFERRING PROVIDER: [name], NP   END OF SESSION:  PT End of Session - 07/26/22 1305     Visit Number 2    Number of Visits 17    Date for PT Re-Evaluation 09/21/22    Progress Note Due on Visit 10    PT Start Time 1305    PT Stop Time 1346    PT Time Calculation (min) 41 min    Equipment Utilized During Treatment Gait belt    Activity Tolerance Patient tolerated treatment well    Behavior During Therapy WFL for tasks assessed/performed             Past Medical History:  Diagnosis Date   Diabetes mellitus without complication (HCC)    ED (erectile dysfunction)    Hypertension    Motion sickness    boats   Neuropathy    Prostate enlargement    Urinary retention    Past Surgical History:  Procedure Laterality Date   BASAL CELL CARCINOMA EXCISION     nose    CATARACT EXTRACTION Bilateral 1986, 2014   COLONOSCOPY WITH PROPOFOL N/A 03/27/2018   Procedure: COLONOSCOPY WITH BIOPSIES;  Surgeon: Toney Reil, MD;  Location: [facility];  Service: Endoscopy;  Laterality: N/A;  Diabetic - oral meds   EYE SURGERY Bilateral [phone]   FRACTURE SURGERY Right    as a child/ right arm   HERNIA REPAIR Right 01-13-14   inguinal hernia   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 N/A 02/07/2017   Procedure: 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;  Surgeon: Vanna Scotland, MD;  Location: [facility];  Service: Urology;  Laterality: N/A;   POLYPECTOMY N/A 03/27/2018   Procedure: POLYPECTOMY;  Surgeon: Toney Reil, MD;  Location: [facility];  Service: Endoscopy;  Laterality: N/A;   RETINAL DETACHMENT SURGERY  Jan 2015   TONSILLECTOMY  1955   Patient Active Problem List   Diagnosis Date Noted   PVD (peripheral vascular disease)  (HCC) 11/30/2021   B12 deficiency 01/29/2019   History of prostate cancer 12/13/2017   Hemorrhoids 12/13/2017   Hyperlipidemia associated with type 2 diabetes mellitus (HCC) 04/02/2017   Advanced care planning/counseling discussion 10/11/2016   Type 2 diabetes mellitus with diabetic neuropathy (HCC) 12/15/2014   Hypertension associated with diabetes (HCC)     REFERRING DIAG: E11.40 (ICD-10-CM) - Type 2 diabetes mellitus with diabetic neuropathy, without long-term current use of insulin (HCC)   THERAPY DIAG:  Unsteadiness on feet  Difficulty in walking, not elsewhere classified  Muscle weakness (generalized)  History of falling  Rationale for Evaluation and Treatment Rehabilitation  PERTINENT HISTORY: Balance, weakness. Can get around well without a cane. Standing still, pt loses balance. Has to hold onto things to stabilize himself. Has labradore thats about 77 year old and can walk his dog without a leash because the dog's pulling stabilizes him. Symptoms have been going on for a while and was told to have diabetic neuropathy. Had a fall in 2015 while working on the flooring in his den at home which caused R inguinal hernia S/P mesh placement. Pt states noticing problems in his legs since 2015. Pt also could not push up on his toes (tip toe) since then. No back pain currently but has had a hx of back pain over the years.  Decreased sensation B feet, almost not noticeable. Feels like he has a lot of weakness which makes it difficult to keep his balance. Has B LE swelling, comes and goes, not currently getting treated for it. No low back pain, just L arm and shoulder.   PRECAUTIONS: Fall risk; L5 anterolisthesis   SUBJECTIVE:   SUBJECTIVE STATEMENT: Neck discomfort. Other than that he's fine.     PAIN:  Are you having pain? See subjective   TODAY'S TREATMENT:                                                                                                                                          DATE: 07/26/2022      Per MRI on 12/26/2016: 1. 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 There is probable chronic bilateral L5 nerve root encroachment.      No latex allergies.      Therapeutic exercise   Reclined  Hooklying transversus abdominis contraction 10x5 seconds   Posterior pelvic tilt 10x3 with 5 second holds   Mini crunch 10x2   Ankle PF    R 10x2 yellow band (Easy per pt)    Then red band 10x3    L 10x3 with red band    Ankle DF   Yellow band 10x each LE    No resistance     R 10x5 seconds     Difficult     L 10x5 seconds     Difficult   Posterior pelvic tilt with march 10x2 each LE      Improved exercise technique, movement at target joints, use of target muscles after mod verbal, visual, tactile cues.          Response to treatment Pt tolerated session well without aggravation of symptoms. Improved B LE sensation after session.          Clinical impression Worked on trunk strength and improving lumbopelvic posture to decrease stress to lumbar nerves innervating his B LE. Also worked on ankle strengthening to promote ability to ambulate with less difficulty. Pt tolerated session well without aggravation of symptoms. Improved B LE sensation reported after session. Pt will benefit from continued skilled physical therapy services to improve strength, balance, function, and decrease fall risk.         PATIENT EDUCATION: Education details: there-ex, HEP Person educated: Patient Education method: Explanation, Demonstration, Tactile cues, Verbal cues, and Handouts Education comprehension: verbalized understanding and returned demonstration  HOME EXERCISE PROGRAM: Access Code: [email or web address] URL: [email or web address] Date: 07/24/2022 Prepared by: Loralyn Freshwater   Exercises - Seated Transversus Abdominis Bracing  - 5 x daily - 7  x weekly - 3 sets - 10 reps - 5 seconds hold  - Supine Posterior Pelvic Tilt  - 1 x daily - 7 x weekly - 3 sets - 10 reps - 5  seconds hold - Long Sitting Ankle Plantar Flexion with Resistance  - 1 x daily - 7 x weekly - 3 sets - 10 reps  - Long Sitting Ankle Dorsiflexion AROM  - 1 x daily - 7 x weekly - 3 sets - 10 reps - 5 seconds hold  PT Short Term Goals - 07/24/22 1422       PT SHORT TERM GOAL #1   Title Pt will be independent with his initial HEP to improve strength, balance, and function.    Baseline Pt has started his HEP. (07/24/2022)    Time 3    Period Weeks    Status New    Target Date 08/18/22              PT Long Term Goals - 07/24/22 1423       PT LONG TERM GOAL #1   Title Pt will improve B ankle PF, DF, EV, and IV strength by at least 1/2 MMT grade to promote ability to ambulate, as well as perform standing tasks with less difficulty and decreased fall risk.    Baseline Manually resisted ankle: PF 3+/5 R, and L;  DF 3+/5 R, 3/5 L; EV 4-/5 R, 3+/5 L; IV 3+/5 R and L (07/24/2022)    Time 8    Period Weeks    Status New    Target Date 09/22/22      PT LONG TERM GOAL #2   Title Pt will improve B hip extension and abduction strength to promote ability to perform standing tasks with less difficulty and decreased fall risk.    Baseline Hip extension 3+/5 R and L; hip abduction 4/5 R and L (07/24/2022)    Time 8    Period Weeks    Status New    Target Date 09/22/22      PT LONG TERM GOAL #3   Title Pt will improve his FOTO score by at least 10 points as a demonsration of improved function.    Baseline Foot FOTO 67 (07/24/2022)    Time 8    Period Weeks    Status New    Target Date 09/22/22      PT LONG TERM GOAL #4   Title Pt will improve his DGI score to at least 19 points as a demonstration of improved balance and decreased fall risk.    Baseline DGI 13 (07/24/2022)    Time 8    Period Weeks    Status New    Target Date 09/22/22              Plan -  07/26/22 1255     Clinical Impression Statement Worked on trunk strength and improving lumbopelvic posture to decrease stress to lumbar nerves innervating his B LE. Also worked on ankle strengthening to promote ability to ambulate with less difficulty. Pt tolerated session well without aggravation of symptoms. Improved B LE sensation reported after session. Pt will benefit from continued skilled physical therapy services to improve strength, balance, function, and decrease fall risk.    Personal Factors and Comorbidities Comorbidity 3+;Age;Past/Current Experience;Time since onset of injury/illness/exacerbation;Profession;Fitness    Comorbidities HTN, DM, neuropathy    Examination-Activity Limitations Stand;Lift;Carry;Bend;Locomotion Level;Dressing;Squat;Transfers;Stairs    Stability/Clinical Decision Making Evolving/Moderate complexity   Balance seem to be worsening based on subjective reports.   Rehab Potential Fair    PT Frequency 2x / week    PT Duration 8 weeks    PT Treatment/Interventions Therapeutic exercise;Balance training;Functional mobility training;Stair training;Therapeutic activities;Neuromuscular  re-education;Patient/family education;Manual techniques;Electrical Stimulation;Gait training    PT Next Visit Plan lumbopelvic strengthening, stability and control, B hip, thigh, and ankle strengthening, balance, gait, manual techniques, modalities PRN.    PT Home Exercise Plan Medbridge Access Code: [email or web address]    Consulted and Agree with Plan of Care Patient              Loralyn Freshwater PT, DPT  07/26/2022, 3:32 PM

## 2022-07-31 ENCOUNTER — Ambulatory Visit: Payer: Medicare Other

## 2022-07-31 DIAGNOSIS — M6281 Muscle weakness (generalized): Secondary | ICD-10-CM

## 2022-07-31 DIAGNOSIS — Z9181 History of falling: Secondary | ICD-10-CM

## 2022-07-31 DIAGNOSIS — E1140 Type 2 diabetes mellitus with diabetic neuropathy, unspecified: Secondary | ICD-10-CM | POA: Diagnosis not present

## 2022-07-31 DIAGNOSIS — R262 Difficulty in walking, not elsewhere classified: Secondary | ICD-10-CM | POA: Diagnosis not present

## 2022-07-31 DIAGNOSIS — R2681 Unsteadiness on feet: Secondary | ICD-10-CM | POA: Diagnosis not present

## 2022-07-31 NOTE — Therapy (Signed)
OUTPATIENT PHYSICAL THERAPY TREATMENT NOTE   Patient Name: Tyler Morgan MRN: [id number] DOB:12/10/1945, 77 y.o., male Today's Date: 07/31/2022  PCP: [name], NP  REFERRING PROVIDER: [name], NP   END OF SESSION:  PT End of Session - 07/31/22 1516     Visit Number 3    Number of Visits 17    Date for PT Re-Evaluation 09/21/22    Progress Note Due on Visit 10    PT Start Time 1516    PT Stop Time 1600    PT Time Calculation (min) 44 min    Equipment Utilized During Treatment Gait belt    Activity Tolerance Patient tolerated treatment well    Behavior During Therapy WFL for tasks assessed/performed              Past Medical History:  Diagnosis Date   Diabetes mellitus without complication (HCC)    ED (erectile dysfunction)    Hypertension    Motion sickness    boats   Neuropathy    Prostate enlargement    Urinary retention    Past Surgical History:  Procedure Laterality Date   BASAL CELL CARCINOMA EXCISION     nose    CATARACT EXTRACTION Bilateral 1986, 2014   COLONOSCOPY WITH PROPOFOL N/A 03/27/2018   Procedure: COLONOSCOPY WITH BIOPSIES;  Surgeon: Toney Reil, MD;  Location: [facility];  Service: Endoscopy;  Laterality: N/A;  Diabetic - oral meds   EYE SURGERY Bilateral [phone]   FRACTURE SURGERY Right    as a child/ right arm   HERNIA REPAIR Right 01-13-14   inguinal hernia   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 N/A 02/07/2017   Procedure: 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;  Surgeon: Vanna Scotland, MD;  Location: [facility];  Service: Urology;  Laterality: N/A;   POLYPECTOMY N/A 03/27/2018   Procedure: POLYPECTOMY;  Surgeon: Toney Reil, MD;  Location: [facility];  Service: Endoscopy;  Laterality: N/A;   RETINAL DETACHMENT SURGERY  Jan 2015   TONSILLECTOMY  1955   Patient Active Problem List   Diagnosis Date Noted   PVD (peripheral vascular disease)  (HCC) 11/30/2021   B12 deficiency 01/29/2019   History of prostate cancer 12/13/2017   Hemorrhoids 12/13/2017   Hyperlipidemia associated with type 2 diabetes mellitus (HCC) 04/02/2017   Advanced care planning/counseling discussion 10/11/2016   Type 2 diabetes mellitus with diabetic neuropathy (HCC) 12/15/2014   Hypertension associated with diabetes (HCC)     REFERRING DIAG: E11.40 (ICD-10-CM) - Type 2 diabetes mellitus with diabetic neuropathy, without long-term current use of insulin (HCC)   THERAPY DIAG:  Unsteadiness on feet  Difficulty in walking, not elsewhere classified  Muscle weakness (generalized)  History of falling  Rationale for Evaluation and Treatment Rehabilitation  PERTINENT HISTORY: Balance, weakness. Can get around well without a cane. Standing still, pt loses balance. Has to hold onto things to stabilize himself. Has labradore thats about 77 year old and can walk his dog without a leash because the dog's pulling stabilizes him. Symptoms have been going on for a while and was told to have diabetic neuropathy. Had a fall in 2015 while working on the flooring in his den at home which caused R inguinal hernia S/P mesh placement. Pt states noticing problems in his legs since 2015. Pt also could not push up on his toes (tip toe) since then. No back pain currently but has had a hx of back pain over the  years. Decreased sensation B feet, almost not noticeable. Feels like he has a lot of weakness which makes it difficult to keep his balance. Has B LE swelling, comes and goes, not currently getting treated for it. No low back pain, just L arm and shoulder.   PRECAUTIONS: Fall risk; L5 anterolisthesis   SUBJECTIVE:   SUBJECTIVE STATEMENT: Doing pretty good. Feels weak in his legs after doing the ankle exercises. Takes his time and be careful afterwards. His PCP knows about the swelling in his legs.     PAIN:  Are you having pain? See subjective   TODAY'S TREATMENT:                                                                                                                                          DATE: 07/31/2022      Per MRI on 12/26/2016: 1. 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 There is probable chronic bilateral L5 nerve root encroachment.      No latex allergies.      Therapeutic exercise   No pain with calf squeeze R and L  Reclined    Posterior pelvic tilt 10x3 with 5 second holds   Posterior pelvic tilt with march 10x3 each LE   Supine DKTC (double knee to chest 10x5 seconds for 2 sets   Slight L lateral thigh tightness, eases with rest.     Mini crunch 10x3   Reverse crunch 10x2   Leg press green band    R 10x3    L 10x3    Seated trunk flexion stretch 10x5 seconds, then 10x10 seconds  Standing B shoulder extension red band 10x5 seconds        Improved exercise technique, movement at target joints, use of target muscles after mod verbal, visual, tactile cues.          Response to treatment LE feels "spongy" after session reported.          Clinical impression  Continued working on trunk strength and improving lumbopelvic posture to decrease stress to lumbar nerves innervating his B LE. Added hip and quadriceps extension strengthening to improve ability to support himself when standing. Pt will benefit from continued skilled physical therapy services to improve strength, balance, function, and decrease fall risk.         PATIENT EDUCATION: Education details: there-ex, HEP Person educated: Patient Education method: Explanation, Demonstration, Tactile cues, Verbal cues, and Handouts Education comprehension: verbalized understanding and returned demonstration  HOME EXERCISE PROGRAM: Access Code: [email or web address] URL: [email or web address] Date: 07/24/2022 Prepared by: Loralyn Freshwater   Exercises - Seated  Transversus Abdominis Bracing  - 5 x daily - 7 x weekly - 3 sets - 10 reps - 5 seconds hold  - Supine Posterior Pelvic Tilt  - 1 x daily - 7 x weekly - 3 sets -  10 reps - 5 seconds hold - Long Sitting Ankle Plantar Flexion with Resistance  - 1 x daily - 7 x weekly - 3 sets - 10 reps  - Long Sitting Ankle Dorsiflexion AROM  - 1 x daily - 7 x weekly - 3 sets - 10 reps - 5 seconds hold  - Supine March with Posterior Pelvic Tilt  - 1 x daily - 7 x weekly - 3 sets - 10 reps - Curl Up with Arms Crossed  - 1 x daily - 7 x weekly - 3 sets - 10 reps     PT Short Term Goals - 07/24/22 1422       PT SHORT TERM GOAL #1   Title Pt will be independent with his initial HEP to improve strength, balance, and function.    Baseline Pt has started his HEP. (07/24/2022)    Time 3    Period Weeks    Status New    Target Date 08/18/22              PT Long Term Goals - 07/24/22 1423       PT LONG TERM GOAL #1   Title Pt will improve B ankle PF, DF, EV, and IV strength by at least 1/2 MMT grade to promote ability to ambulate, as well as perform standing tasks with less difficulty and decreased fall risk.    Baseline Manually resisted ankle: PF 3+/5 R, and L;  DF 3+/5 R, 3/5 L; EV 4-/5 R, 3+/5 L; IV 3+/5 R and L (07/24/2022)    Time 8    Period Weeks    Status New    Target Date 09/22/22      PT LONG TERM GOAL #2   Title Pt will improve B hip extension and abduction strength to promote ability to perform standing tasks with less difficulty and decreased fall risk.    Baseline Hip extension 3+/5 R and L; hip abduction 4/5 R and L (07/24/2022)    Time 8    Period Weeks    Status New    Target Date 09/22/22      PT LONG TERM GOAL #3   Title Pt will improve his FOTO score by at least 10 points as a demonsration of improved function.    Baseline Foot FOTO 67 (07/24/2022)    Time 8    Period Weeks    Status New    Target Date 09/22/22      PT LONG TERM GOAL #4   Title Pt will improve his DGI score  to at least 19 points as a demonstration of improved balance and decreased fall risk.    Baseline DGI 13 (07/24/2022)    Time 8    Period Weeks    Status New    Target Date 09/22/22              Plan - 07/31/22 1516     Clinical Impression Statement Continued working on trunk strength and improving lumbopelvic posture to decrease stress to lumbar nerves innervating his B LE. Added hip and quadriceps extension strengthening to improve ability to support himself when standing. Pt will benefit from continued skilled physical therapy services to improve strength, balance, function, and decrease fall risk.    Personal Factors and Comorbidities Comorbidity 3+;Age;Past/Current Experience;Time since onset of injury/illness/exacerbation;Profession;Fitness    Comorbidities HTN, DM, neuropathy    Examination-Activity Limitations Stand;Lift;Carry;Bend;Locomotion Level;Dressing;Squat;Transfers;Stairs    Stability/Clinical Decision Making Evolving/Moderate complexity   Balance seem to  be worsening based on subjective reports.   Rehab Potential Fair    PT Frequency 2x / week    PT Duration 8 weeks    PT Treatment/Interventions Therapeutic exercise;Balance training;Functional mobility training;Stair training;Therapeutic activities;Neuromuscular re-education;Patient/family education;Manual techniques;Electrical Stimulation;Gait training    PT Next Visit Plan lumbopelvic strengthening, stability and control, B hip, thigh, and ankle strengthening, balance, gait, manual techniques, modalities PRN.    PT Home Exercise Plan Medbridge Access Code: [email or web address]    Consulted and Agree with Plan of Care Patient              Loralyn Freshwater PT, DPT  07/31/2022, 4:12 PM

## 2022-08-02 ENCOUNTER — Ambulatory Visit: Payer: Medicare Other

## 2022-08-02 DIAGNOSIS — M6281 Muscle weakness (generalized): Secondary | ICD-10-CM

## 2022-08-02 DIAGNOSIS — Z9181 History of falling: Secondary | ICD-10-CM | POA: Diagnosis not present

## 2022-08-02 DIAGNOSIS — R262 Difficulty in walking, not elsewhere classified: Secondary | ICD-10-CM | POA: Diagnosis not present

## 2022-08-02 DIAGNOSIS — R2681 Unsteadiness on feet: Secondary | ICD-10-CM

## 2022-08-02 DIAGNOSIS — E1140 Type 2 diabetes mellitus with diabetic neuropathy, unspecified: Secondary | ICD-10-CM | POA: Diagnosis not present

## 2022-08-02 NOTE — Therapy (Signed)
OUTPATIENT PHYSICAL THERAPY TREATMENT NOTE   Patient Name: Tyler Morgan MRN: [id number] DOB:05-05-1945, 77 y.o., male Today's Date: 08/02/2022  PCP: [name], NP  REFERRING PROVIDER: [name], NP   END OF SESSION:  PT End of Session - 08/02/22 1520     Visit Number 4    Number of Visits 17    Date for PT Re-Evaluation 09/21/22    Progress Note Due on Visit 10    PT Start Time 1520    PT Stop Time 1604    PT Time Calculation (min) 44 min    Equipment Utilized During Treatment --    Activity Tolerance Patient tolerated treatment well    Behavior During Therapy WFL for tasks assessed/performed               Past Medical History:  Diagnosis Date   Diabetes mellitus without complication (HCC)    ED (erectile dysfunction)    Hypertension    Motion sickness    boats   Neuropathy    Prostate enlargement    Urinary retention    Past Surgical History:  Procedure Laterality Date   BASAL CELL CARCINOMA EXCISION     nose    CATARACT EXTRACTION Bilateral 1986, 2014   COLONOSCOPY WITH PROPOFOL N/A 03/27/2018   Procedure: COLONOSCOPY WITH BIOPSIES;  Surgeon: Toney Reil, MD;  Location: [facility];  Service: Endoscopy;  Laterality: N/A;  Diabetic - oral meds   EYE SURGERY Bilateral [phone]   FRACTURE SURGERY Right    as a child/ right arm   HERNIA REPAIR Right 01-13-14   inguinal hernia   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 N/A 02/07/2017   Procedure: 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;  Surgeon: Vanna Scotland, MD;  Location: [facility];  Service: Urology;  Laterality: N/A;   POLYPECTOMY N/A 03/27/2018   Procedure: POLYPECTOMY;  Surgeon: Toney Reil, MD;  Location: [facility];  Service: Endoscopy;  Laterality: N/A;   RETINAL DETACHMENT SURGERY  Jan 2015   TONSILLECTOMY  1955   Patient Active Problem List   Diagnosis Date Noted   PVD (peripheral vascular disease) (HCC)  11/30/2021   B12 deficiency 01/29/2019   History of prostate cancer 12/13/2017   Hemorrhoids 12/13/2017   Hyperlipidemia associated with type 2 diabetes mellitus (HCC) 04/02/2017   Advanced care planning/counseling discussion 10/11/2016   Type 2 diabetes mellitus with diabetic neuropathy (HCC) 12/15/2014   Hypertension associated with diabetes (HCC)     REFERRING DIAG: E11.40 (ICD-10-CM) - Type 2 diabetes mellitus with diabetic neuropathy, without long-term current use of insulin (HCC)   THERAPY DIAG:  Unsteadiness on feet  Difficulty in walking, not elsewhere classified  Muscle weakness (generalized)  History of falling  Rationale for Evaluation and Treatment Rehabilitation  PERTINENT HISTORY: Balance, weakness. Can get around well without a cane. Standing still, pt loses balance. Has to hold onto things to stabilize himself. Has labradore thats about 77 year old and can walk his dog without a leash because the dog's pulling stabilizes him. Symptoms have been going on for a while and was told to have diabetic neuropathy. Had a fall in 2015 while working on the flooring in his den at home which caused R inguinal hernia S/P mesh placement. Pt states noticing problems in his legs since 2015. Pt also could not push up on his toes (tip toe) since then. No back pain currently but has had a hx of back pain over the  years. Decreased sensation B feet, almost not noticeable. Feels like he has a lot of weakness which makes it difficult to keep his balance. Has B LE swelling, comes and goes, not currently getting treated for it. No low back pain, just L arm and shoulder.   PRECAUTIONS: Fall risk; L5 anterolisthesis   SUBJECTIVE:   SUBJECTIVE STATEMENT: Doing pretty good. Walking seems to be improved.     PAIN:  Are you having pain? See subjective   TODAY'S TREATMENT:                                                                                                                                          DATE: 08/02/2022      Per MRI on 12/26/2016: 1. 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 There is probable chronic bilateral L5 nerve root encroachment.      No latex allergies.      Therapeutic exercise   No pain with calf squeeze R and L  Reclined    Posterior pelvic tilt 10x2 with 10 second holds   Posterior pelvic tilt with march 10x3 each LE    Supine DKTC (double knee to chest 10x5 seconds for 3 sets   No symptoms.    Bent knee fallout 10x3 each LE   Leg press green band    R 10x3    L 10x3     Reverse crunch 10x, then 10x5 seconds for 2 sets   Try reclined hooklying pallof press next visit if appropriate        Improved exercise technique, movement at target joints, use of target muscles after mod verbal, visual, tactile cues.          Response to treatment Pt tolerated session well without aggravation of symptoms.           Clinical impression  Improved ease with standing up from a seated position and walking right afterwards observed at the start of session. Continued working on trunk strength and improving lumbopelvic posture and control to decrease stress to lumbar nerves innervating his B LE. Pt tolerated session well without aggravation of symptoms. Pt will benefit from continued skilled physical therapy services to improve strength, balance, function, and decrease fall risk.         PATIENT EDUCATION: Education details: there-ex, HEP Person educated: Patient Education method: Explanation, Demonstration, Tactile cues, Verbal cues, and Handouts Education comprehension: verbalized understanding and returned demonstration  HOME EXERCISE PROGRAM: Access Code: [email or web address] URL: [email or web address] Date: 07/24/2022 Prepared by: Loralyn Freshwater   Exercises - Seated Transversus Abdominis Bracing  - 5 x daily - 7 x weekly - 3  sets - 10 reps - 5 seconds hold  - Supine Posterior Pelvic Tilt  - 1 x daily - 7 x weekly - 3 sets - 10 reps - 5 seconds hold -  Long Sitting Ankle Plantar Flexion with Resistance  - 1 x daily - 7 x weekly - 3 sets - 10 reps  - Long Sitting Ankle Dorsiflexion AROM  - 1 x daily - 7 x weekly - 3 sets - 10 reps - 5 seconds hold  - Supine March with Posterior Pelvic Tilt  - 1 x daily - 7 x weekly - 3 sets - 10 reps - Curl Up with Arms Crossed  - 1 x daily - 7 x weekly - 3 sets - 10 reps - Supine Double Knee to Chest  - 1 x daily - 7 x weekly - 3 sets - 10 reps - 5 seconds hold - Bent Knee Fallouts with Alternating Legs  - 1 x daily - 7 x weekly - 3 sets - 10 reps      PT Short Term Goals - 07/24/22 1422       PT SHORT TERM GOAL #1   Title Pt will be independent with his initial HEP to improve strength, balance, and function.    Baseline Pt has started his HEP. (07/24/2022)    Time 3    Period Weeks    Status New    Target Date 08/18/22              PT Long Term Goals - 07/24/22 1423       PT LONG TERM GOAL #1   Title Pt will improve B ankle PF, DF, EV, and IV strength by at least 1/2 MMT grade to promote ability to ambulate, as well as perform standing tasks with less difficulty and decreased fall risk.    Baseline Manually resisted ankle: PF 3+/5 R, and L;  DF 3+/5 R, 3/5 L; EV 4-/5 R, 3+/5 L; IV 3+/5 R and L (07/24/2022)    Time 8    Period Weeks    Status New    Target Date 09/22/22      PT LONG TERM GOAL #2   Title Pt will improve B hip extension and abduction strength to promote ability to perform standing tasks with less difficulty and decreased fall risk.    Baseline Hip extension 3+/5 R and L; hip abduction 4/5 R and L (07/24/2022)    Time 8    Period Weeks    Status New    Target Date 09/22/22      PT LONG TERM GOAL #3   Title Pt will improve his FOTO score by at least 10 points as a demonsration of improved function.    Baseline Foot FOTO 67 (07/24/2022)    Time 8     Period Weeks    Status New    Target Date 09/22/22      PT LONG TERM GOAL #4   Title Pt will improve his DGI score to at least 19 points as a demonstration of improved balance and decreased fall risk.    Baseline DGI 13 (07/24/2022)    Time 8    Period Weeks    Status New    Target Date 09/22/22              Plan - 08/02/22 1519     Clinical Impression Statement Improved ease with standing up from a seated position and walking right afterwards observed at the start of session. Continued working on trunk strength and improving lumbopelvic posture and control to decrease stress to lumbar nerves innervating his B LE. Pt tolerated session well without aggravation of symptoms. Pt will benefit  from continued skilled physical therapy services to improve strength, balance, function, and decrease fall risk.    Personal Factors and Comorbidities Comorbidity 3+;Age;Past/Current Experience;Time since onset of injury/illness/exacerbation;Profession;Fitness    Comorbidities HTN, DM, neuropathy    Examination-Activity Limitations Stand;Lift;Carry;Bend;Locomotion Level;Dressing;Squat;Transfers;Stairs    Stability/Clinical Decision Making Evolving/Moderate complexity   Balance seem to be worsening based on subjective reports.   Rehab Potential Fair    PT Frequency 2x / week    PT Duration 8 weeks    PT Treatment/Interventions Therapeutic exercise;Balance training;Functional mobility training;Stair training;Therapeutic activities;Neuromuscular re-education;Patient/family education;Manual techniques;Electrical Stimulation;Gait training    PT Next Visit Plan lumbopelvic strengthening, stability and control, B hip, thigh, and ankle strengthening, balance, gait, manual techniques, modalities PRN.    PT Home Exercise Plan Medbridge Access Code: [email or web address]    Consulted and Agree with Plan of Care Patient               Loralyn Freshwater PT, DPT  08/02/2022, 4:17 PM

## 2022-08-07 ENCOUNTER — Ambulatory Visit: Payer: Medicare Other

## 2022-08-07 DIAGNOSIS — Z9181 History of falling: Secondary | ICD-10-CM | POA: Diagnosis not present

## 2022-08-07 DIAGNOSIS — E1140 Type 2 diabetes mellitus with diabetic neuropathy, unspecified: Secondary | ICD-10-CM | POA: Diagnosis not present

## 2022-08-07 DIAGNOSIS — R262 Difficulty in walking, not elsewhere classified: Secondary | ICD-10-CM

## 2022-08-07 DIAGNOSIS — M6281 Muscle weakness (generalized): Secondary | ICD-10-CM

## 2022-08-07 DIAGNOSIS — R2681 Unsteadiness on feet: Secondary | ICD-10-CM

## 2022-08-07 NOTE — Therapy (Signed)
OUTPATIENT PHYSICAL THERAPY TREATMENT NOTE   Patient Name: Tyler Morgan MRN: [id number] DOB:May 19, 1945, 77 y.o., male Today's Date: 08/07/2022  PCP: [name], NP  REFERRING PROVIDER: [name], NP   END OF SESSION:  PT End of Session - 08/07/22 1522     Visit Number 5    Number of Visits 17    Date for PT Re-Evaluation 09/21/22    Progress Note Due on Visit 10    PT Start Time 1522    PT Stop Time 1602    PT Time Calculation (min) 40 min    Activity Tolerance Patient tolerated treatment well    Behavior During Therapy [facility] for tasks assessed/performed                Past Medical History:  Diagnosis Date   Diabetes mellitus without complication (HCC)    ED (erectile dysfunction)    Hypertension    Motion sickness    boats   Neuropathy    Prostate enlargement    Urinary retention    Past Surgical History:  Procedure Laterality Date   BASAL CELL CARCINOMA EXCISION     nose    CATARACT EXTRACTION Bilateral 1986, 2014   COLONOSCOPY WITH PROPOFOL N/A 03/27/2018   Procedure: COLONOSCOPY WITH BIOPSIES;  Surgeon: Toney Reil, MD;  Location: [facility];  Service: Endoscopy;  Laterality: N/A;  Diabetic - oral meds   EYE SURGERY Bilateral [phone]   FRACTURE SURGERY Right    as a child/ right arm   HERNIA REPAIR Right 01-13-14   inguinal hernia   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 N/A 02/07/2017   Procedure: 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;  Surgeon: Vanna Scotland, MD;  Location: [facility];  Service: Urology;  Laterality: N/A;   POLYPECTOMY N/A 03/27/2018   Procedure: POLYPECTOMY;  Surgeon: Toney Reil, MD;  Location: [facility];  Service: Endoscopy;  Laterality: N/A;   RETINAL DETACHMENT SURGERY  Jan 2015   TONSILLECTOMY  1955   Patient Active Problem List   Diagnosis Date Noted   PVD (peripheral vascular disease) (HCC) 11/30/2021   B12 deficiency 01/29/2019    History of prostate cancer 12/13/2017   Hemorrhoids 12/13/2017   Hyperlipidemia associated with type 2 diabetes mellitus (HCC) 04/02/2017   Advanced care planning/counseling discussion 10/11/2016   Type 2 diabetes mellitus with diabetic neuropathy (HCC) 12/15/2014   Hypertension associated with diabetes (HCC)     REFERRING DIAG: E11.40 (ICD-10-CM) - Type 2 diabetes mellitus with diabetic neuropathy, without long-term current use of insulin (HCC)   THERAPY DIAG:  Unsteadiness on feet  Difficulty in walking, not elsewhere classified  Muscle weakness (generalized)  History of falling  Rationale for Evaluation and Treatment Rehabilitation  PERTINENT HISTORY: Balance, weakness. Can get around well without a cane. Standing still, pt loses balance. Has to hold onto things to stabilize himself. Has labradore thats about 77 year old and can walk his dog without a leash because the dog's pulling stabilizes him. Symptoms have been going on for a while and was told to have diabetic neuropathy. Had a fall in 2015 while working on the flooring in his den at home which caused R inguinal hernia S/P mesh placement. Pt states noticing problems in his legs since 2015. Pt also could not push up on his toes (tip toe) since then. No back pain currently but has had a hx of back pain over the years. Decreased sensation B feet, almost not  noticeable. Feels like he has a lot of weakness which makes it difficult to keep his balance. Has B LE swelling, comes and goes, not currently getting treated for it. No low back pain, just L arm and shoulder.   PRECAUTIONS: Fall risk; L5 anterolisthesis   SUBJECTIVE:   SUBJECTIVE STATEMENT: Walking is pretty good. LE strength feels better.     PAIN:  Are you having pain? See subjective   TODAY'S TREATMENT:                                                                                                                                         DATE: 08/07/2022      Per  MRI on 12/26/2016: 1. 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 There is probable chronic bilateral L5 nerve root encroachment.      No latex allergies.      Therapeutic exercise   Sitting posture: R lumbar convexity  Seated transversus abdominis and glute max contraction 10x3 with 5 second holds  Seated manually resisted L lateral shift isometrics with PT to decrease R lumbar convexity 10x5 seconds for 3 sets  Improved posture observed during exercise  Seated B scapular retraction 10x3 with 5 second holds to promote thoracic extension and decrease lumbar extension stress  Seated B shoulder low rows red band with B scapular retraction 10x5 seconds for 3 sets  CGA with gait belt  Standing posterior pelvic tilt (with transversus and glute max contraction) with B UE assist 10x3 with 5 sec  Standing mini squats with forward trunk flexion with B UE assist 10x3  To improve LE strength with less extension stress to low back.       Improved exercise technique, movement at target joints, use of target muscles after mod verbal, visual, tactile cues.          Response to treatment Fair tolerance to today's session.      Clinical impression  Improving B LE strength based on subjective reports. Continued working on improving posture, and trunk strength to decrease stress to B LE nerves. Worked on standing mini squats today with trunk in slight flexion to improve LE strength while keeping stress away from low back. Fair tolerance to today's session. Pt will benefit from continued skilled physical therapy services to improve strength, balance, function, and decrease fall risk.         PATIENT EDUCATION: Education details: there-ex, HEP Person educated: Patient Education method: Explanation, Demonstration, Tactile cues, Verbal cues, and Handouts Education comprehension: verbalized  understanding and returned demonstration  HOME EXERCISE PROGRAM: Access Code: [email or web address] URL: [email or web address] Date: 07/24/2022 Prepared by: Loralyn Freshwater   Exercises - Seated Transversus Abdominis Bracing  - 5 x daily - 7 x weekly - 3 sets - 10 reps - 5 seconds hold  - Supine Posterior Pelvic Tilt  - 1  x daily - 7 x weekly - 3 sets - 10 reps - 5 seconds hold - Long Sitting Ankle Plantar Flexion with Resistance  - 1 x daily - 7 x weekly - 3 sets - 10 reps  - Long Sitting Ankle Dorsiflexion AROM  - 1 x daily - 7 x weekly - 3 sets - 10 reps - 5 seconds hold  - Supine March with Posterior Pelvic Tilt  - 1 x daily - 7 x weekly - 3 sets - 10 reps - Curl Up with Arms Crossed  - 1 x daily - 7 x weekly - 3 sets - 10 reps - Supine Double Knee to Chest  - 1 x daily - 7 x weekly - 3 sets - 10 reps - 5 seconds hold - Bent Knee Fallouts with Alternating Legs  - 1 x daily - 7 x weekly - 3 sets - 10 reps      PT Short Term Goals - 07/24/22 1422       PT SHORT TERM GOAL #1   Title Pt will be independent with his initial HEP to improve strength, balance, and function.    Baseline Pt has started his HEP. (07/24/2022)    Time 3    Period Weeks    Status New    Target Date 08/18/22              PT Long Term Goals - 07/24/22 1423       PT LONG TERM GOAL #1   Title Pt will improve B ankle PF, DF, EV, and IV strength by at least 1/2 MMT grade to promote ability to ambulate, as well as perform standing tasks with less difficulty and decreased fall risk.    Baseline Manually resisted ankle: PF 3+/5 R, and L;  DF 3+/5 R, 3/5 L; EV 4-/5 R, 3+/5 L; IV 3+/5 R and L (07/24/2022)    Time 8    Period Weeks    Status New    Target Date 09/22/22      PT LONG TERM GOAL #2   Title Pt will improve B hip extension and abduction strength to promote ability to perform standing tasks with less difficulty and decreased fall risk.    Baseline Hip extension 3+/5 R and L; hip abduction 4/5 R and  L (07/24/2022)    Time 8    Period Weeks    Status New    Target Date 09/22/22      PT LONG TERM GOAL #3   Title Pt will improve his FOTO score by at least 10 points as a demonsration of improved function.    Baseline Foot FOTO 67 (07/24/2022)    Time 8    Period Weeks    Status New    Target Date 09/22/22      PT LONG TERM GOAL #4   Title Pt will improve his DGI score to at least 19 points as a demonstration of improved balance and decreased fall risk.    Baseline DGI 13 (07/24/2022)    Time 8    Period Weeks    Status New    Target Date 09/22/22              Plan - 08/07/22 1522     Clinical Impression Statement Improving B LE strength based on subjective reports. Continued working on improving posture, and trunk strength to decrease stress to B LE nerves. Worked on standing mini squats today with trunk in slight flexion  to improve LE strength while keeping stress away from low back. Fair tolerance to today's session. Pt will benefit from continued skilled physical therapy services to improve strength, balance, function, and decrease fall risk.    Personal Factors and Comorbidities Comorbidity 3+;Age;Past/Current Experience;Time since onset of injury/illness/exacerbation;Profession;Fitness    Comorbidities HTN, DM, neuropathy    Examination-Activity Limitations Stand;Lift;Carry;Bend;Locomotion Level;Dressing;Squat;Transfers;Stairs    Stability/Clinical Decision Making Evolving/Moderate complexity   Balance seem to be worsening based on subjective reports.   Rehab Potential Fair    PT Frequency 2x / week    PT Duration 8 weeks    PT Treatment/Interventions Therapeutic exercise;Balance training;Functional mobility training;Stair training;Therapeutic activities;Neuromuscular re-education;Patient/family education;Manual techniques;Electrical Stimulation;Gait training    PT Next Visit Plan lumbopelvic strengthening, stability and control, B hip, thigh, and ankle strengthening, balance,  gait, manual techniques, modalities PRN.    PT Home Exercise Plan Medbridge Access Code: [email or web address]    Consulted and Agree with Plan of Care Patient               Loralyn Freshwater PT, DPT  08/07/2022, 4:07 PM

## 2022-08-14 ENCOUNTER — Ambulatory Visit: Payer: Medicare Other

## 2022-08-14 DIAGNOSIS — R2681 Unsteadiness on feet: Secondary | ICD-10-CM

## 2022-08-14 DIAGNOSIS — Z9181 History of falling: Secondary | ICD-10-CM | POA: Diagnosis not present

## 2022-08-14 DIAGNOSIS — R262 Difficulty in walking, not elsewhere classified: Secondary | ICD-10-CM

## 2022-08-14 DIAGNOSIS — M6281 Muscle weakness (generalized): Secondary | ICD-10-CM | POA: Diagnosis not present

## 2022-08-14 DIAGNOSIS — E1140 Type 2 diabetes mellitus with diabetic neuropathy, unspecified: Secondary | ICD-10-CM | POA: Diagnosis not present

## 2022-08-14 NOTE — Therapy (Signed)
OUTPATIENT PHYSICAL THERAPY TREATMENT NOTE   Patient Name: Tyler Morgan MRN: [id number] DOB:June 17, 1945, 77 y.o., male Today's Date: 08/14/2022  PCP: [name], NP  REFERRING PROVIDER: [name], NP   END OF SESSION:  PT End of Session - 08/14/22 1516     Visit Number 6    Number of Visits 17    Date for PT Re-Evaluation 09/21/22    Progress Note Due on Visit 10    PT Start Time 1516    PT Stop Time 1557    PT Time Calculation (min) 41 min    Activity Tolerance Patient tolerated treatment well    Behavior During Therapy WFL for tasks assessed/performed                 Past Medical History:  Diagnosis Date   Diabetes mellitus without complication (HCC)    ED (erectile dysfunction)    Hypertension    Motion sickness    boats   Neuropathy    Prostate enlargement    Urinary retention    Past Surgical History:  Procedure Laterality Date   BASAL CELL CARCINOMA EXCISION     nose    CATARACT EXTRACTION Bilateral 1986, 2014   COLONOSCOPY WITH PROPOFOL N/A 03/27/2018   Procedure: COLONOSCOPY WITH BIOPSIES;  Surgeon: Toney Reil, MD;  Location: [facility];  Service: Endoscopy;  Laterality: N/A;  Diabetic - oral meds   EYE SURGERY Bilateral [phone]   FRACTURE SURGERY Right    as a child/ right arm   HERNIA REPAIR Right 01-13-14   inguinal hernia   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 N/A 02/07/2017   Procedure: 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;  Surgeon: Vanna Scotland, MD;  Location: [facility];  Service: Urology;  Laterality: N/A;   POLYPECTOMY N/A 03/27/2018   Procedure: POLYPECTOMY;  Surgeon: Toney Reil, MD;  Location: [facility];  Service: Endoscopy;  Laterality: N/A;   RETINAL DETACHMENT SURGERY  Jan 2015   TONSILLECTOMY  1955   Patient Active Problem List   Diagnosis Date Noted   PVD (peripheral vascular disease) (HCC) 11/30/2021   B12 deficiency  01/29/2019   History of prostate cancer 12/13/2017   Hemorrhoids 12/13/2017   Hyperlipidemia associated with type 2 diabetes mellitus (HCC) 04/02/2017   Advanced care planning/counseling discussion 10/11/2016   Type 2 diabetes mellitus with diabetic neuropathy (HCC) 12/15/2014   Hypertension associated with diabetes (HCC)     REFERRING DIAG: E11.40 (ICD-10-CM) - Type 2 diabetes mellitus with diabetic neuropathy, without long-term current use of insulin (HCC)   THERAPY DIAG:  Unsteadiness on feet  Difficulty in walking, not elsewhere classified  Muscle weakness (generalized)  History of falling  Rationale for Evaluation and Treatment Rehabilitation  PERTINENT HISTORY: Balance, weakness. Can get around well without a cane. Standing still, pt loses balance. Has to hold onto things to stabilize himself. Has labradore thats about 77 year old and can walk his dog without a leash because the dog's pulling stabilizes him. Symptoms have been going on for a while and was told to have diabetic neuropathy. Had a fall in 2015 while working on the flooring in his den at home which caused R inguinal hernia S/P mesh placement. Pt states noticing problems in his legs since 2015. Pt also could not push up on his toes (tip toe) since then. No back pain currently but has had a hx of back pain over the years. Decreased sensation B feet, almost  not noticeable. Feels like he has a lot of weakness which makes it difficult to keep his balance. Has B LE swelling, comes and goes, not currently getting treated for it. No low back pain, just L arm and shoulder.   PRECAUTIONS: Fall risk; L5 anterolisthesis   SUBJECTIVE:   SUBJECTIVE STATEMENT: Did not exercise yesterday. Balance and walking has been pretty good but today was not feeling pretty good. Did some garden work to clear the area for getting a fence.     PAIN:  Are you having pain? See subjective   TODAY'S TREATMENT:                                                                                                                                          DATE: 08/14/2022      Per MRI on 12/26/2016: 1. 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 There is probable chronic bilateral L5 nerve root encroachment.      No latex allergies.      Therapeutic exercise   Standing posterior pelvic tilt with B UE assist  10x3 with 5 second holds   Then with B shoulder extension red band 10x3 with 5 second holds    Then with mini squats 10x3  Seated trunk flexion with transversus abdominis contraction 10x10 seconds for 2 sets  Seated B heel toe raises 10x3 each direction   difficult  Seated manually resisted L lateral shift isometrics with PT to decrease R lumbar convexity 10x5 seconds for 3 sets  Improved posture observed during exercise        Improved exercise technique, movement at target joints, use of target muscles after mod verbal, visual, tactile cues.          Response to treatment Fair tolerance to today's session.      Clinical impression  Continued working on improving posture, and trunk strength to decrease stress to B LE nerves. Worked on standing mini squats with trunk in slight flexion/posterior pelvic tilt to improve LE strength while keeping stress away from low back. Worked on B soleus strengthening in sitting secondary to difficulty with standing position. Fair tolerance to today's session. Pt will benefit from continued skilled physical therapy services to improve strength, balance, function, and decrease fall risk.         PATIENT EDUCATION: Education details: there-ex, HEP Person educated: Patient Education method: Explanation, Demonstration, Tactile cues, Verbal cues, and Handouts Education comprehension: verbalized understanding and returned demonstration  HOME EXERCISE PROGRAM: Access Code: [email or web address] URL:  [email or web address] Date: 07/24/2022 Prepared by: Loralyn Freshwater   Exercises - Seated Transversus Abdominis Bracing  - 5 x daily - 7 x weekly - 3 sets - 10 reps - 5 seconds hold  - Supine Posterior Pelvic Tilt  - 1 x daily - 7 x weekly - 3 sets - 10 reps -  5 seconds hold - Long Sitting Ankle Plantar Flexion with Resistance  - 1 x daily - 7 x weekly - 3 sets - 10 reps  - Long Sitting Ankle Dorsiflexion AROM  - 1 x daily - 7 x weekly - 3 sets - 10 reps - 5 seconds hold  - Supine March with Posterior Pelvic Tilt  - 1 x daily - 7 x weekly - 3 sets - 10 reps - Curl Up with Arms Crossed  - 1 x daily - 7 x weekly - 3 sets - 10 reps - Supine Double Knee to Chest  - 1 x daily - 7 x weekly - 3 sets - 10 reps - 5 seconds hold - Bent Knee Fallouts with Alternating Legs  - 1 x daily - 7 x weekly - 3 sets - 10 reps - Standing Posterior Pelvic Tilt  - 3 x daily - 7 x weekly - 3 sets - 10 reps - 5 seconds hold     PT Short Term Goals - 07/24/22 1422       PT SHORT TERM GOAL #1   Title Pt will be independent with his initial HEP to improve strength, balance, and function.    Baseline Pt has started his HEP. (07/24/2022)    Time 3    Period Weeks    Status New    Target Date 08/18/22              PT Long Term Goals - 07/24/22 1423       PT LONG TERM GOAL #1   Title Pt will improve B ankle PF, DF, EV, and IV strength by at least 1/2 MMT grade to promote ability to ambulate, as well as perform standing tasks with less difficulty and decreased fall risk.    Baseline Manually resisted ankle: PF 3+/5 R, and L;  DF 3+/5 R, 3/5 L; EV 4-/5 R, 3+/5 L; IV 3+/5 R and L (07/24/2022)    Time 8    Period Weeks    Status New    Target Date 09/22/22      PT LONG TERM GOAL #2   Title Pt will improve B hip extension and abduction strength to promote ability to perform standing tasks with less difficulty and decreased fall risk.    Baseline Hip extension 3+/5 R and L; hip abduction 4/5 R and L  (07/24/2022)    Time 8    Period Weeks    Status New    Target Date 09/22/22      PT LONG TERM GOAL #3   Title Pt will improve his FOTO score by at least 10 points as a demonsration of improved function.    Baseline Foot FOTO 67 (07/24/2022)    Time 8    Period Weeks    Status New    Target Date 09/22/22      PT LONG TERM GOAL #4   Title Pt will improve his DGI score to at least 19 points as a demonstration of improved balance and decreased fall risk.    Baseline DGI 13 (07/24/2022)    Time 8    Period Weeks    Status New    Target Date 09/22/22              Plan - 08/14/22 1516     Clinical Impression Statement Continued working on improving posture, and trunk strength to decrease stress to B LE nerves. Worked on standing mini squats with trunk in slight  flexion/posterior pelvic tilt to improve LE strength while keeping stress away from low back. Worked on B soleus strengthening in sitting secondary to difficulty with standing position. Fair tolerance to today's session. Pt will benefit from continued skilled physical therapy services to improve strength, balance, function, and decrease fall risk.    Personal Factors and Comorbidities Comorbidity 3+;Age;Past/Current Experience;Time since onset of injury/illness/exacerbation;Profession;Fitness    Comorbidities HTN, DM, neuropathy    Examination-Activity Limitations Stand;Lift;Carry;Bend;Locomotion Level;Dressing;Squat;Transfers;Stairs    Stability/Clinical Decision Making Evolving/Moderate complexity   Balance seem to be worsening based on subjective reports.   Rehab Potential Fair    PT Frequency 2x / week    PT Duration 8 weeks    PT Treatment/Interventions Therapeutic exercise;Balance training;Functional mobility training;Stair training;Therapeutic activities;Neuromuscular re-education;Patient/family education;Manual techniques;Electrical Stimulation;Gait training    PT Next Visit Plan lumbopelvic strengthening, stability and  control, B hip, thigh, and ankle strengthening, balance, gait, manual techniques, modalities PRN.    PT Home Exercise Plan Medbridge Access Code: [email or web address]    Consulted and Agree with Plan of Care Patient               Loralyn Freshwater PT, DPT  08/14/2022, 4:06 PM

## 2022-08-15 ENCOUNTER — Ambulatory Visit: Payer: Medicare Other | Admitting: Dermatology

## 2022-08-15 ENCOUNTER — Encounter: Payer: Self-pay | Admitting: Dermatology

## 2022-08-15 DIAGNOSIS — Z7189 Other specified counseling: Secondary | ICD-10-CM

## 2022-08-15 DIAGNOSIS — L720 Epidermal cyst: Secondary | ICD-10-CM

## 2022-08-15 DIAGNOSIS — B353 Tinea pedis: Secondary | ICD-10-CM

## 2022-08-15 DIAGNOSIS — Z79899 Other long term (current) drug therapy: Secondary | ICD-10-CM | POA: Diagnosis not present

## 2022-08-15 DIAGNOSIS — L821 Other seborrheic keratosis: Secondary | ICD-10-CM | POA: Diagnosis not present

## 2022-08-15 DIAGNOSIS — L729 Follicular cyst of the skin and subcutaneous tissue, unspecified: Secondary | ICD-10-CM

## 2022-08-15 DIAGNOSIS — L230 Allergic contact dermatitis due to metals: Secondary | ICD-10-CM | POA: Diagnosis not present

## 2022-08-15 DIAGNOSIS — B351 Tinea unguium: Secondary | ICD-10-CM | POA: Diagnosis not present

## 2022-08-15 MED ORDER — TERBINAFINE HCL 250 MG PO TABS: 250.0000 mg | ORAL_TABLET | Freq: Every day | ORAL | 0 refills | Status: DC

## 2022-08-15 NOTE — Patient Instructions (Signed)
Due to recent changes in healthcare laws, you may see results of your pathology and/or laboratory studies on MyChart before the doctors have had a chance to review them. We understand that in some cases there may be results that are confusing or concerning to you. Please understand that not all results are received at the same time and often the doctors may need to interpret multiple results in order to provide you with the best plan of care or course of treatment. Therefore, we ask that you please give us 2 business days to thoroughly review all your results before contacting the office for clarification. Should we see a critical lab result, you will be contacted sooner.   If You Need Anything After Your Visit  If you have any questions or concerns for your doctor, please call our main line at [phone] and press option 4 to reach your doctor's medical assistant. If no one answers, please leave a voicemail as directed and we will return your call as soon as possible. Messages left after 4 pm will be answered the following business day.   You may also send us a message via MyChart. We typically respond to MyChart messages within 1-2 business days.  For prescription refills, please ask your pharmacy to contact our office. Our fax number is [phone].  If you have an urgent issue when the clinic is closed that cannot wait until the next business day, you can page your doctor at the number below.    Please note that while we do our best to be available for urgent issues outside of office hours, we are not available 24/7.   If you have an urgent issue and are unable to reach us, you may choose to seek medical care at your doctor's office, retail clinic, urgent care center, or emergency room.  If you have a medical emergency, please immediately call 911 or go to the emergency department.  Pager Numbers  - Dr. Kowalski: [phone]  - Dr. Moye: [phone]  - Dr. Stewart:  [phone]  In the event of inclement weather, please call our main line at [phone] for an update on the status of any delays or closures.  Dermatology Medication Tips: Please keep the boxes that topical medications come in in order to help keep track of the instructions about where and how to use these. Pharmacies typically print the medication instructions only on the boxes and not directly on the medication tubes.   If your medication is too expensive, please contact our office at [phone] option 4 or send us a message through MyChart.   We are unable to tell what your co-pay for medications will be in advance as this is different depending on your insurance coverage. However, we may be able to find a substitute medication at lower cost or fill out paperwork to get insurance to cover a needed medication.   If a prior authorization is required to get your medication covered by your insurance company, please allow us 1-2 business days to complete this process.  Drug prices often vary depending on where the prescription is filled and some pharmacies may offer cheaper prices.  The website www.goodrx.com contains coupons for medications through different pharmacies. The prices here do not account for what the cost may be with help from insurance (it may be cheaper with your insurance), but the website can give you the price if you did not use any insurance.  - You can print the associated coupon and take it with   your prescription to the pharmacy.  - You may also stop by our office during regular business hours and pick up a GoodRx coupon card.  - If you need your prescription sent electronically to a different pharmacy, notify our office through [facility] MyChart or by phone at [phone] option 4.     Si Usted Necesita Algo Despus de Su Visita  Tambin puede enviarnos un mensaje a travs de MyChart. Por lo general respondemos a los mensajes de MyChart en el transcurso de 1 a 2  das hbiles.  Para renovar recetas, por favor pida a su farmacia que se ponga en contacto con nuestra oficina. Nuestro nmero de fax es el [phone].  Si tiene un asunto urgente cuando la clnica est cerrada y que no puede esperar hasta el siguiente da hbil, puede llamar/localizar a su doctor(a) al nmero que aparece a continuacin.   Por favor, tenga en cuenta que aunque hacemos todo lo posible para estar disponibles para asuntos urgentes fuera del horario de oficina, no estamos disponibles las 24 horas del da, los 7 das de la semana.   Si tiene un problema urgente y no puede comunicarse con nosotros, puede optar por buscar atencin mdica  en el consultorio de su doctor(a), en una clnica privada, en un centro de atencin urgente o en una sala de emergencias.  Si tiene una emergencia mdica, por favor llame inmediatamente al 911 o vaya a la sala de emergencias.  Nmeros de bper  - Dr. Kowalski: [phone]  - Dra. Moye: [phone]  - Dra. Stewart: [phone]  En caso de inclemencias del tiempo, por favor llame a nuestra lnea principal al [phone] para una actualizacin sobre el estado de cualquier retraso o cierre.  Consejos para la medicacin en dermatologa: Por favor, guarde las cajas en las que vienen los medicamentos de uso tpico para ayudarle a seguir las instrucciones sobre dnde y cmo usarlos. Las farmacias generalmente imprimen las instrucciones del medicamento slo en las cajas y no directamente en los tubos del medicamento.   Si su medicamento es muy caro, por favor, pngase en contacto con nuestra oficina llamando al [phone] y presione la opcin 4 o envenos un mensaje a travs de MyChart.   No podemos decirle cul ser su copago por los medicamentos por adelantado ya que esto es diferente dependiendo de la cobertura de su seguro. Sin embargo, es posible que podamos encontrar un medicamento sustituto a menor costo o llenar un formulario para que el  seguro cubra el medicamento que se considera necesario.   Si se requiere una autorizacin previa para que su compaa de seguros cubra su medicamento, por favor permtanos de 1 a 2 das hbiles para completar este proceso.  Los precios de los medicamentos varan con frecuencia dependiendo del lugar de dnde se surte la receta y alguna farmacias pueden ofrecer precios ms baratos.  El sitio web www.goodrx.com tiene cupones para medicamentos de diferentes farmacias. Los precios aqu no tienen en cuenta lo que podra costar con la ayuda del seguro (puede ser ms barato con su seguro), pero el sitio web puede darle el precio si no utiliz ningn seguro.  - Puede imprimir el cupn correspondiente y llevarlo con su receta a la farmacia.  - Tambin puede pasar por nuestra oficina durante el horario de atencin regular y recoger una tarjeta de cupones de GoodRx.  - Si necesita que su receta se enve electrnicamente a una farmacia diferente, informe a nuestra oficina a travs de MyChart [facility]   o por telfono llamando al [phone] y presione la opcin 4.  

## 2022-08-15 NOTE — Progress Notes (Signed)
   Follow-Up Visit   Subjective  Tyler Morgan is a 77 y.o. male who presents for the following: tinea unguium and pedis, improved with Lamisil 250 mg po once daily for three months. The patient has spots, moles and lesions to be evaluated, some may be new or changing and the patient may have concern these could be cancer.  The following portions of the chart were reviewed this encounter and updated as appropriate: medications, allergies, medical history  Review of Systems:  No other skin or systemic complaints except as noted in HPI or Assessment and Plan.  Objective  Well appearing patient in no apparent distress; mood and affect are within normal limits.  A focused examination was performed of the following areas: the L foot    Relevant exam findings are noted in the Assessment and Plan.    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 Condition is symptomatic / bothersome to patient. Improved, but not to goal.   Continue Lamisil 250 mg po QD x 1 more months. Terbinafine Counseling   Terbinafine is an anti-fungal medicine that can be applied to the skin (over the counter) or taken by mouth (prescription) to treat fungal infections. The pill version is often used to treat fungal infections of the nails or scalp. While most people do not have any side effects from taking terbinafine pills, some possible side effects of the medicine can include taste changes, headache, loss of smell, vision changes, nausea, vomiting, or diarrhea.    Rare side effects can include irritation of the liver, allergic reaction, or decrease in blood counts (which may show up as not feeling well or developing an infection). If you are concerned about any of these side effects, please stop the medicine and call your doctor, or in the case of an emergency such as  feeling very unwell, seek immediate medical care.   EPIDERMAL INCLUSION CYST Exam: Subcutaneous nodule at R upper back 1.0 cm  Benign-appearing. Exam most consistent with an epidermal inclusion cyst. Discussed that a cyst is a benign growth that can grow over time and sometimes get irritated or inflamed. Recommend observation if it is not bothersome. Discussed option of surgical excision to remove it if it is growing, symptomatic, or other changes noted. Please call for new or changing lesions so they can be evaluated.  SEBORRHEIC KERATOSIS - Stuck-on, waxy, tan-brown papules and/or plaques  - Benign-appearing - Discussed benign etiology and prognosis. - Observe - Call for any changes  Allergic contact dermatitis due to metals Leg Hx of rash where keys sit in pocket - patient reports not being able to wear rings. Consistent with nickel allergy Avoid contact with Nickel products.  Return if symptoms worsen or fail to improve.  Maylene Roes, CMA, am acting as scribe for Armida Sans, MD .  Documentation: I have reviewed the above documentation for accuracy and completeness, and I agree with the above.  Armida Sans, MD

## 2022-08-16 ENCOUNTER — Ambulatory Visit: Payer: Medicare Other

## 2022-08-16 DIAGNOSIS — Z9181 History of falling: Secondary | ICD-10-CM | POA: Diagnosis not present

## 2022-08-16 DIAGNOSIS — M6281 Muscle weakness (generalized): Secondary | ICD-10-CM

## 2022-08-16 DIAGNOSIS — E1140 Type 2 diabetes mellitus with diabetic neuropathy, unspecified: Secondary | ICD-10-CM | POA: Diagnosis not present

## 2022-08-16 DIAGNOSIS — R262 Difficulty in walking, not elsewhere classified: Secondary | ICD-10-CM | POA: Diagnosis not present

## 2022-08-16 DIAGNOSIS — R2681 Unsteadiness on feet: Secondary | ICD-10-CM

## 2022-08-16 NOTE — Therapy (Signed)
OUTPATIENT PHYSICAL THERAPY TREATMENT NOTE   Patient Name: Tyler Morgan MRN: [id number] DOB:1946-01-09, 77 y.o., male Today's Date: 08/16/2022  PCP: [name], NP  REFERRING PROVIDER: [name], NP   END OF SESSION:  PT End of Session - 08/16/22 1513     Visit Number 7    Number of Visits 17    Date for PT Re-Evaluation 09/21/22    Progress Note Due on Visit 10    PT Start Time 1513    PT Stop Time 1603    PT Time Calculation (min) 50 min    Activity Tolerance Patient tolerated treatment well    Behavior During Therapy WFL for tasks assessed/performed                  Past Medical History:  Diagnosis Date   Diabetes mellitus without complication (HCC)    ED (erectile dysfunction)    Hypertension    Motion sickness    boats   Neuropathy    Prostate enlargement    Urinary retention    Past Surgical History:  Procedure Laterality Date   BASAL CELL CARCINOMA EXCISION     nose    CATARACT EXTRACTION Bilateral 1986, 2014   COLONOSCOPY WITH PROPOFOL N/A 03/27/2018   Procedure: COLONOSCOPY WITH BIOPSIES;  Surgeon: Toney Reil, MD;  Location: [facility];  Service: Endoscopy;  Laterality: N/A;  Diabetic - oral meds   EYE SURGERY Bilateral [id number]   FRACTURE SURGERY Right    as a child/ right arm   HERNIA REPAIR Right 01-13-14   inguinal hernia   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 N/A 02/07/2017   Procedure: 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;  Surgeon: Vanna Scotland, MD;  Location: [facility];  Service: Urology;  Laterality: N/A;   POLYPECTOMY N/A 03/27/2018   Procedure: POLYPECTOMY;  Surgeon: Toney Reil, MD;  Location: [facility];  Service: Endoscopy;  Laterality: N/A;   RETINAL DETACHMENT SURGERY  Jan 2015   TONSILLECTOMY  1955   Patient Active Problem List   Diagnosis Date Noted   PVD (peripheral vascular disease) (HCC) 11/30/2021   B12 deficiency  01/29/2019   History of prostate cancer 12/13/2017   Hemorrhoids 12/13/2017   Hyperlipidemia associated with type 2 diabetes mellitus (HCC) 04/02/2017   Advanced care planning/counseling discussion 10/11/2016   Type 2 diabetes mellitus with diabetic neuropathy (HCC) 12/15/2014   Hypertension associated with diabetes (HCC)     REFERRING DIAG: E11.40 (ICD-10-CM) - Type 2 diabetes mellitus with diabetic neuropathy, without long-term current use of insulin (HCC)   THERAPY DIAG:  Unsteadiness on feet  Difficulty in walking, not elsewhere classified  Muscle weakness (generalized)  History of falling  Rationale for Evaluation and Treatment Rehabilitation  PERTINENT HISTORY: Balance, weakness. Can get around well without a cane. Standing still, pt loses balance. Has to hold onto things to stabilize himself. Has labradore thats about 77 year old and can walk his dog without a leash because the dog's pulling stabilizes him. Symptoms have been going on for a while and was told to have diabetic neuropathy. Had a fall in 2015 while working on the flooring in his den at home which caused R inguinal hernia S/P mesh placement. Pt states noticing problems in his legs since 2015. Pt also could not push up on his toes (tip toe) since then. No back pain currently but has had a hx of back pain over the years. Decreased sensation B feet,  almost not noticeable. Feels like he has a lot of weakness which makes it difficult to keep his balance. Has B LE swelling, comes and goes, not currently getting treated for it. No low back pain, just L arm and shoulder.   PRECAUTIONS: Fall risk; L5 anterolisthesis   SUBJECTIVE:   SUBJECTIVE STATEMENT: Legs are ok. Not a great day from doing lots of stuff.     PAIN:  Are you having pain? See subjective   TODAY'S TREATMENT:                                                                                                                                         DATE:  08/16/2022      Per MRI on 12/26/2016: 1. 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 There is probable chronic bilateral L5 nerve root encroachment.      No latex allergies.      Therapeutic exercise   Hooklying posterior pelvic tilts 10x10 seconds   Then with mini bridge  Then with march 10x each LE    Then with dead bugs alternate hip extension shoulder flexion 5x3  Dizziness with supine to sit Blood pressure L arm sitting, mechanically taken, normal cuff: 145/74, HR 66 No dizziness afterwards   Seated manually resisted L lateral shift isometrics with PT to decrease R lumbar convexity 10x5 seconds for 2 sets   Seated manually resisted R lateral shift isometrics in neutral 10x5 seconds for 2 sets to improve trunk strength   Improved posture observed  Sitting with upright posture in neutral  Manually resisted trunk extension isometrics 10x5 seconds for 2 sets   Manually resisted L upper trunk rotation isometrics to decrease L lumbar rotation posture 10x3 with 5 second holds  Improved lumbar posture observed.    Seated with transversus abdominis contraction   B heel toe raises 10x3   Feels like pt can move more initially reported        Improved exercise technique, movement at target joints, use of target muscles after mod verbal, visual, tactile cues.          Response to treatment Pt tolerated session well without aggravation of symptoms.       Clinical impression  Continued working on improving trunk strength (abdominals, lumbar paraspinals, and obliques) and decreasing R lumbar convexity and L lumbar rotation posture to decrease stress to B LE nerves. Pt tolerated session well without aggravation of symptoms. Pt will benefit from continued skilled physical therapy services to improve strength, balance, function, and decrease fall risk.         PATIENT  EDUCATION: Education details: there-ex, HEP Person educated: Patient Education method: Explanation, Demonstration, Tactile cues, Verbal cues, and Handouts Education comprehension: verbalized understanding and returned demonstration  HOME EXERCISE PROGRAM: Access Code: [email or web address] URL: [email or web address] Date: 07/24/2022 Prepared by: Loralyn Freshwater  Exercises - Seated Transversus Abdominis Bracing  - 5 x daily - 7 x weekly - 3 sets - 10 reps - 5 seconds hold  - Supine Posterior Pelvic Tilt  - 1 x daily - 7 x weekly - 3 sets - 10 reps - 5 seconds hold - Long Sitting Ankle Plantar Flexion with Resistance  - 1 x daily - 7 x weekly - 3 sets - 10 reps  - Long Sitting Ankle Dorsiflexion AROM  - 1 x daily - 7 x weekly - 3 sets - 10 reps - 5 seconds hold  - Supine March with Posterior Pelvic Tilt  - 1 x daily - 7 x weekly - 3 sets - 10 reps - Curl Up with Arms Crossed  - 1 x daily - 7 x weekly - 3 sets - 10 reps - Supine Double Knee to Chest  - 1 x daily - 7 x weekly - 3 sets - 10 reps - 5 seconds hold - Bent Knee Fallouts with Alternating Legs  - 1 x daily - 7 x weekly - 3 sets - 10 reps - Standing Posterior Pelvic Tilt  - 3 x daily - 7 x weekly - 3 sets - 10 reps - 5 seconds hold     PT Short Term Goals - 07/24/22 1422       PT SHORT TERM GOAL #1   Title Pt will be independent with his initial HEP to improve strength, balance, and function.    Baseline Pt has started his HEP. (07/24/2022)    Time 3    Period Weeks    Status New    Target Date 08/18/22              PT Long Term Goals - 07/24/22 1423       PT LONG TERM GOAL #1   Title Pt will improve B ankle PF, DF, EV, and IV strength by at least 1/2 MMT grade to promote ability to ambulate, as well as perform standing tasks with less difficulty and decreased fall risk.    Baseline Manually resisted ankle: PF 3+/5 R, and L;  DF 3+/5 R, 3/5 L; EV 4-/5 R, 3+/5 L; IV 3+/5 R and L (07/24/2022)    Time 8    Period  Weeks    Status New    Target Date 09/22/22      PT LONG TERM GOAL #2   Title Pt will improve B hip extension and abduction strength to promote ability to perform standing tasks with less difficulty and decreased fall risk.    Baseline Hip extension 3+/5 R and L; hip abduction 4/5 R and L (07/24/2022)    Time 8    Period Weeks    Status New    Target Date 09/22/22      PT LONG TERM GOAL #3   Title Pt will improve his FOTO score by at least 10 points as a demonsration of improved function.    Baseline Foot FOTO 67 (07/24/2022)    Time 8    Period Weeks    Status New    Target Date 09/22/22      PT LONG TERM GOAL #4   Title Pt will improve his DGI score to at least 19 points as a demonstration of improved balance and decreased fall risk.    Baseline DGI 13 (07/24/2022)    Time 8    Period Weeks    Status New    Target Date 09/22/22  Plan - 08/16/22 1512     Clinical Impression Statement Continued working on improving trunk strength (abdominals, lumbar paraspinals, and obliques) and decreasing R lumbar convexity and L lumbar rotation posture to decrease stress to B LE nerves. Pt tolerated session well without aggravation of symptoms. Pt will benefit from continued skilled physical therapy services to improve strength, balance, function, and decrease fall risk.    Personal Factors and Comorbidities Comorbidity 3+;Age;Past/Current Experience;Time since onset of injury/illness/exacerbation;Profession;Fitness    Comorbidities HTN, DM, neuropathy    Examination-Activity Limitations Stand;Lift;Carry;Bend;Locomotion Level;Dressing;Squat;Transfers;Stairs    Stability/Clinical Decision Making Evolving/Moderate complexity   Balance seem to be worsening based on subjective reports.   Rehab Potential Fair    PT Frequency 2x / week    PT Duration 8 weeks    PT Treatment/Interventions Therapeutic exercise;Balance training;Functional mobility training;Stair training;Therapeutic  activities;Neuromuscular re-education;Patient/family education;Manual techniques;Electrical Stimulation;Gait training    PT Next Visit Plan lumbopelvic strengthening, stability and control, B hip, thigh, and ankle strengthening, balance, gait, manual techniques, modalities PRN.    PT Home Exercise Plan Medbridge Access Code: [email or web address]    Consulted and Agree with Plan of Care Patient               Loralyn Freshwater PT, DPT  08/16/2022, 4:07 PM

## 2022-08-21 ENCOUNTER — Ambulatory Visit: Payer: Medicare Other | Attending: Nurse Practitioner

## 2022-08-21 DIAGNOSIS — Z9181 History of falling: Secondary | ICD-10-CM | POA: Insufficient documentation

## 2022-08-21 DIAGNOSIS — R262 Difficulty in walking, not elsewhere classified: Secondary | ICD-10-CM | POA: Diagnosis not present

## 2022-08-21 DIAGNOSIS — R2681 Unsteadiness on feet: Secondary | ICD-10-CM | POA: Diagnosis not present

## 2022-08-21 DIAGNOSIS — M6281 Muscle weakness (generalized): Secondary | ICD-10-CM | POA: Diagnosis not present

## 2022-08-21 NOTE — Therapy (Signed)
OUTPATIENT PHYSICAL THERAPY TREATMENT NOTE   Patient Name: Tyler Morgan MRN: [id number] DOB:1945/07/01, 77 y.o., male Today's Date: 08/21/2022  PCP: [name], NP  REFERRING PROVIDER: [name], NP   END OF SESSION:  PT End of Session - 08/21/22 1516     Visit Number 8    Number of Visits 17    Date for PT Re-Evaluation 09/21/22    Progress Note Due on Visit 10    PT Start Time 1516    PT Stop Time 1558    PT Time Calculation (min) 42 min    Activity Tolerance Patient tolerated treatment well    Behavior During Therapy WFL for tasks assessed/performed                   Past Medical History:  Diagnosis Date   Diabetes mellitus without complication (HCC)    ED (erectile dysfunction)    Hypertension    Motion sickness    boats   Neuropathy    Prostate enlargement    Urinary retention    Past Surgical History:  Procedure Laterality Date   BASAL CELL CARCINOMA EXCISION     nose    CATARACT EXTRACTION Bilateral 1986, 2014   COLONOSCOPY WITH PROPOFOL N/A 03/27/2018   Procedure: COLONOSCOPY WITH BIOPSIES;  Surgeon: Toney Reil, MD;  Location: [facility];  Service: Endoscopy;  Laterality: N/A;  Diabetic - oral meds   EYE SURGERY Bilateral [phone]   FRACTURE SURGERY Right    as a child/ right arm   HERNIA REPAIR Right 01-13-14   inguinal hernia   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 N/A 02/07/2017   Procedure: 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;  Surgeon: Vanna Scotland, MD;  Location: [facility];  Service: Urology;  Laterality: N/A;   POLYPECTOMY N/A 03/27/2018   Procedure: POLYPECTOMY;  Surgeon: Toney Reil, MD;  Location: [facility];  Service: Endoscopy;  Laterality: N/A;   RETINAL DETACHMENT SURGERY  Jan 2015   TONSILLECTOMY  1955   Patient Active Problem List   Diagnosis Date Noted   PVD (peripheral vascular disease) (HCC) 11/30/2021   B12 deficiency  01/29/2019   History of prostate cancer 12/13/2017   Hemorrhoids 12/13/2017   Hyperlipidemia associated with type 2 diabetes mellitus (HCC) 04/02/2017   Advanced care planning/counseling discussion 10/11/2016   Type 2 diabetes mellitus with diabetic neuropathy (HCC) 12/15/2014   Hypertension associated with diabetes (HCC)     REFERRING DIAG: E11.40 (ICD-10-CM) - Type 2 diabetes mellitus with diabetic neuropathy, without long-term current use of insulin (HCC)   THERAPY DIAG:  Unsteadiness on feet  Difficulty in walking, not elsewhere classified  Muscle weakness (generalized)  History of falling  Rationale for Evaluation and Treatment Rehabilitation  PERTINENT HISTORY: Balance, weakness. Can get around well without a cane. Standing still, pt loses balance. Has to hold onto things to stabilize himself. Has labradore thats about 77 year old and can walk his dog without a leash because the dog's pulling stabilizes him. Symptoms have been going on for a while and was told to have diabetic neuropathy. Had a fall in 2015 while working on the flooring in his den at home which caused R inguinal hernia S/P mesh placement. Pt states noticing problems in his legs since 2015. Pt also could not push up on his toes (tip toe) since then. No back pain currently but has had a hx of back pain over the years. Decreased sensation B  feet, almost not noticeable. Feels like he has a lot of weakness which makes it difficult to keep his balance. Has B LE swelling, comes and goes, not currently getting treated for it. No low back pain, just L arm and shoulder.   PRECAUTIONS: Fall risk; L5 anterolisthesis   SUBJECTIVE:   SUBJECTIVE STATEMENT: Legs don't seem to be doing any better today.     PAIN:  Are you having pain? See subjective   TODAY'S TREATMENT:                                                                                                                                         DATE: 08/21/2022       Per MRI on 12/26/2016: 1. 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 There is probable chronic bilateral L5 nerve root encroachment.      No latex allergies.      Therapeutic exercise   Standing mini squat with B UE assist PRN 10x3  Standing with B UE assist   R 10x5 seconds    Then with yellow band around knees 10x5 seconds   Then with yellow band around ankles 10x5 seconds    L 10x5 seconds    Then with yellow band around knees 10x5 seconds    Then with yellow band around ankles 10x5 seconds    Split squat    R 10x3   L 10x3  NuStep, LE only level 4 x 5 minutes.  Seat 10  Cues for maintaining at least 80 SPM  For LE strength and endurance  Standing with B UE assist   Forward weight shifting onto B forefeet with emphasis on ankle strategy 10x5 seconds for 3 sets.       Improved exercise technique, movement at target joints, use of target muscles after mod verbal, visual, tactile cues.          Response to treatment Pt tolerated session well without aggravation of symptoms.       Clinical impression  Focused more on B LE strengthening today. Good LE muscle use felt with exercises observed. Pt tolerated session well without aggravation of symptoms. Legs feel alright reported after session.  Pt will benefit from continued skilled physical therapy services to improve strength, balance, function, and decrease fall risk.         PATIENT EDUCATION: Education details: there-ex, HEP Person educated: Patient Education method: Explanation, Demonstration, Tactile cues, Verbal cues, and Handouts Education comprehension: verbalized understanding and returned demonstration  HOME EXERCISE PROGRAM: Access Code: [email or web address] URL: [email or web address] Date: 07/24/2022 Prepared by: Loralyn Freshwater   Exercises - Seated Transversus Abdominis Bracing  - 5 x daily - 7 x  weekly - 3 sets - 10 reps - 5 seconds hold  - Supine Posterior Pelvic Tilt  - 1 x daily - 7 x weekly - 3  sets - 10 reps - 5 seconds hold - Long Sitting Ankle Plantar Flexion with Resistance  - 1 x daily - 7 x weekly - 3 sets - 10 reps  - Long Sitting Ankle Dorsiflexion AROM  - 1 x daily - 7 x weekly - 3 sets - 10 reps - 5 seconds hold  - Supine March with Posterior Pelvic Tilt  - 1 x daily - 7 x weekly - 3 sets - 10 reps - Curl Up with Arms Crossed  - 1 x daily - 7 x weekly - 3 sets - 10 reps - Supine Double Knee to Chest  - 1 x daily - 7 x weekly - 3 sets - 10 reps - 5 seconds hold - Bent Knee Fallouts with Alternating Legs  - 1 x daily - 7 x weekly - 3 sets - 10 reps - Standing Posterior Pelvic Tilt  - 3 x daily - 7 x weekly - 3 sets - 10 reps - 5 seconds hold  - Mini Squat with Counter Support  - 1 x daily - 7 x weekly - 3 sets - 10 reps  - Standing Hip Abduction with Resistance at Ankles and Counter Support  - 1 x daily - 7 x weekly - 1-3 sets - 10 reps - 5 seconds hold      PT Short Term Goals - 07/24/22 1422       PT SHORT TERM GOAL #1   Title Pt will be independent with his initial HEP to improve strength, balance, and function.    Baseline Pt has started his HEP. (07/24/2022)    Time 3    Period Weeks    Status New    Target Date 08/18/22              PT Long Term Goals - 07/24/22 1423       PT LONG TERM GOAL #1   Title Pt will improve B ankle PF, DF, EV, and IV strength by at least 1/2 MMT grade to promote ability to ambulate, as well as perform standing tasks with less difficulty and decreased fall risk.    Baseline Manually resisted ankle: PF 3+/5 R, and L;  DF 3+/5 R, 3/5 L; EV 4-/5 R, 3+/5 L; IV 3+/5 R and L (07/24/2022)    Time 8    Period Weeks    Status New    Target Date 09/22/22      PT LONG TERM GOAL #2   Title Pt will improve B hip extension and abduction strength to promote ability to perform standing tasks with less difficulty and decreased fall  risk.    Baseline Hip extension 3+/5 R and L; hip abduction 4/5 R and L (07/24/2022)    Time 8    Period Weeks    Status New    Target Date 09/22/22      PT LONG TERM GOAL #3   Title Pt will improve his FOTO score by at least 10 points as a demonsration of improved function.    Baseline Foot FOTO 67 (07/24/2022)    Time 8    Period Weeks    Status New    Target Date 09/22/22      PT LONG TERM GOAL #4   Title Pt will improve his DGI score to at least 19 points as a demonstration of improved balance and decreased fall risk.    Baseline DGI 13 (07/24/2022)    Time 8    Period Weeks  Status New    Target Date 09/22/22              Plan - 08/21/22 1515     Clinical Impression Statement Focused more on B LE strengthening today. Good LE muscle use felt with exercises observed. Pt tolerated session well without aggravation of symptoms. Legs feel alright reported after session.  Pt will benefit from continued skilled physical therapy services to improve strength, balance, function, and decrease fall risk.    Personal Factors and Comorbidities Comorbidity 3+;Age;Past/Current Experience;Time since onset of injury/illness/exacerbation;Profession;Fitness    Comorbidities HTN, DM, neuropathy    Examination-Activity Limitations Stand;Lift;Carry;Bend;Locomotion Level;Dressing;Squat;Transfers;Stairs    Stability/Clinical Decision Making Evolving/Moderate complexity   Balance seem to be worsening based on subjective reports.   Rehab Potential Fair    PT Frequency 2x / week    PT Duration 8 weeks    PT Treatment/Interventions Therapeutic exercise;Balance training;Functional mobility training;Stair training;Therapeutic activities;Neuromuscular re-education;Patient/family education;Manual techniques;Electrical Stimulation;Gait training    PT Next Visit Plan lumbopelvic strengthening, stability and control, B hip, thigh, and ankle strengthening, balance, gait, manual techniques, modalities PRN.    PT  Home Exercise Plan Medbridge Access Code: [email or web address]    Consulted and Agree with Plan of Care Patient               Loralyn Freshwater PT, DPT  08/21/2022, 4:05 PM

## 2022-08-23 ENCOUNTER — Ambulatory Visit: Payer: Medicare Other

## 2022-08-23 DIAGNOSIS — R262 Difficulty in walking, not elsewhere classified: Secondary | ICD-10-CM | POA: Diagnosis not present

## 2022-08-23 DIAGNOSIS — R2681 Unsteadiness on feet: Secondary | ICD-10-CM | POA: Diagnosis not present

## 2022-08-23 DIAGNOSIS — M6281 Muscle weakness (generalized): Secondary | ICD-10-CM

## 2022-08-23 DIAGNOSIS — Z9181 History of falling: Secondary | ICD-10-CM | POA: Diagnosis not present

## 2022-08-23 NOTE — Therapy (Signed)
OUTPATIENT PHYSICAL THERAPY TREATMENT NOTE   Patient Name: Tyler Morgan MRN: [id number] DOB:1945-07-12, 77 y.o., male Today's Date: 08/23/2022  PCP: [name], NP  REFERRING PROVIDER: [name], NP   END OF SESSION:  PT End of Session - 08/23/22 1520     Visit Number 9    Number of Visits 17    Date for PT Re-Evaluation 09/21/22    Progress Note Due on Visit 10    PT Start Time 1520    PT Stop Time 1608    PT Time Calculation (min) 48 min    Activity Tolerance Patient tolerated treatment well    Behavior During Therapy WFL for tasks assessed/performed                    Past Medical History:  Diagnosis Date   Diabetes mellitus without complication (HCC)    ED (erectile dysfunction)    Hypertension    Motion sickness    boats   Neuropathy    Prostate enlargement    Urinary retention    Past Surgical History:  Procedure Laterality Date   BASAL CELL CARCINOMA EXCISION     nose    CATARACT EXTRACTION Bilateral 1986, 2014   COLONOSCOPY WITH PROPOFOL N/A 03/27/2018   Procedure: COLONOSCOPY WITH BIOPSIES;  Surgeon: Toney Reil, MD;  Location: [facility];  Service: Endoscopy;  Laterality: N/A;  Diabetic - oral meds   EYE SURGERY Bilateral [phone]   FRACTURE SURGERY Right    as a child/ right arm   HERNIA REPAIR Right 01-13-14   inguinal hernia   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 N/A 02/07/2017   Procedure: 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;  Surgeon: Vanna Scotland, MD;  Location: [facility];  Service: Urology;  Laterality: N/A;   POLYPECTOMY N/A 03/27/2018   Procedure: POLYPECTOMY;  Surgeon: Toney Reil, MD;  Location: [facility];  Service: Endoscopy;  Laterality: N/A;   RETINAL DETACHMENT SURGERY  Jan 2015   TONSILLECTOMY  1955   Patient Active Problem List   Diagnosis Date Noted   PVD (peripheral vascular disease) (HCC) 11/30/2021   B12 deficiency  01/29/2019   History of prostate cancer 12/13/2017   Hemorrhoids 12/13/2017   Hyperlipidemia associated with type 2 diabetes mellitus (HCC) 04/02/2017   Advanced care planning/counseling discussion 10/11/2016   Type 2 diabetes mellitus with diabetic neuropathy (HCC) 12/15/2014   Hypertension associated with diabetes (HCC)     REFERRING DIAG: E11.40 (ICD-10-CM) - Type 2 diabetes mellitus with diabetic neuropathy, without long-term current use of insulin (HCC)   THERAPY DIAG:  Unsteadiness on feet  Difficulty in walking, not elsewhere classified  Muscle weakness (generalized)  History of falling  Rationale for Evaluation and Treatment Rehabilitation  PERTINENT HISTORY: Balance, weakness. Can get around well without a cane. Standing still, pt loses balance. Has to hold onto things to stabilize himself. Has labradore thats about 77 year old and can walk his dog without a leash because the dog's pulling stabilizes him. Symptoms have been going on for a while and was told to have diabetic neuropathy. Had a fall in 2015 while working on the flooring in his den at home which caused R inguinal hernia S/P mesh placement. Pt states noticing problems in his legs since 2015. Pt also could not push up on his toes (tip toe) since then. No back pain currently but has had a hx of back pain over the years. Decreased sensation  B feet, almost not noticeable. Feels like he has a lot of weakness which makes it difficult to keep his balance. Has B LE swelling, comes and goes, not currently getting treated for it. No low back pain, just L arm and shoulder.   PRECAUTIONS: Fall risk; L5 anterolisthesis   SUBJECTIVE:   SUBJECTIVE STATEMENT: Was going up and down a ladder yesterday, replacing some out door lighting, legs were fine, woke up with L posterior knee cramp which went away.     PAIN:  Are you having pain? See subjective   TODAY'S TREATMENT:                                                                                                                                          DATE: 08/23/2022      Per MRI on 12/26/2016: 1. 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 There is probable chronic bilateral L5 nerve root encroachment.      No latex allergies.      Therapeutic exercise   Sit <> stand from chair with Air Ex pad without UE assist, CGA to min A  10x2  Forward weight shifting onto Air Ex pad with contralateral UE assist   R 10x5 seconds for 3 sets   L 10x5 seconds for 3 sets   Standing with B UE assist   Forward weight shifting onto B forefeet with emphasis on ankle strategy 10x5 seconds for 3 sets.   Standing with B UE assist   R  with yellow band around ankles 10x5 seconds for 2 sets   L with yellow band around ankles 10x5 seconds for 2 sets     Improved exercise technique, movement at target joints, use of target muscles after mod verbal, visual, tactile cues.          Response to treatment Pt tolerated session well without aggravation of symptoms.       Clinical impression  Continued working on B LE strengthening as well as challenging closed chain ankle stability. Good LE muscle use felt with exercises observed and reported. Pt tolerated session well without aggravation of symptoms.  Pt will benefit from continued skilled physical therapy services to improve strength, balance, function, and decrease fall risk.         PATIENT EDUCATION: Education details: there-ex, HEP Person educated: Patient Education method: Explanation, Demonstration, Tactile cues, Verbal cues, and Handouts Education comprehension: verbalized understanding and returned demonstration  HOME EXERCISE PROGRAM: Access Code: [email or web address] URL: [email or web address] Date: 07/24/2022 Prepared by: Loralyn Freshwater   Exercises - Seated Transversus Abdominis Bracing  - 5 x daily - 7 x  weekly - 3 sets - 10 reps - 5 seconds hold  - Supine Posterior Pelvic Tilt  - 1 x daily - 7 x weekly - 3 sets - 10 reps - 5 seconds hold -  Long Sitting Ankle Plantar Flexion with Resistance  - 1 x daily - 7 x weekly - 3 sets - 10 reps  - Long Sitting Ankle Dorsiflexion AROM  - 1 x daily - 7 x weekly - 3 sets - 10 reps - 5 seconds hold  - Supine March with Posterior Pelvic Tilt  - 1 x daily - 7 x weekly - 3 sets - 10 reps - Curl Up with Arms Crossed  - 1 x daily - 7 x weekly - 3 sets - 10 reps - Supine Double Knee to Chest  - 1 x daily - 7 x weekly - 3 sets - 10 reps - 5 seconds hold - Bent Knee Fallouts with Alternating Legs  - 1 x daily - 7 x weekly - 3 sets - 10 reps - Standing Posterior Pelvic Tilt  - 3 x daily - 7 x weekly - 3 sets - 10 reps - 5 seconds hold  - Mini Squat with Counter Support  - 1 x daily - 7 x weekly - 3 sets - 10 reps  - Standing Hip Abduction with Resistance at Ankles and Counter Support  - 1 x daily - 7 x weekly - 1-3 sets - 10 reps - 5 seconds hold      PT Short Term Goals - 07/24/22 1422       PT SHORT TERM GOAL #1   Title Pt will be independent with his initial HEP to improve strength, balance, and function.    Baseline Pt has started his HEP. (07/24/2022)    Time 3    Period Weeks    Status New    Target Date 08/18/22              PT Long Term Goals - 07/24/22 1423       PT LONG TERM GOAL #1   Title Pt will improve B ankle PF, DF, EV, and IV strength by at least 1/2 MMT grade to promote ability to ambulate, as well as perform standing tasks with less difficulty and decreased fall risk.    Baseline Manually resisted ankle: PF 3+/5 R, and L;  DF 3+/5 R, 3/5 L; EV 4-/5 R, 3+/5 L; IV 3+/5 R and L (07/24/2022)    Time 8    Period Weeks    Status New    Target Date 09/22/22      PT LONG TERM GOAL #2   Title Pt will improve B hip extension and abduction strength to promote ability to perform standing tasks with less difficulty and decreased fall  risk.    Baseline Hip extension 3+/5 R and L; hip abduction 4/5 R and L (07/24/2022)    Time 8    Period Weeks    Status New    Target Date 09/22/22      PT LONG TERM GOAL #3   Title Pt will improve his FOTO score by at least 10 points as a demonsration of improved function.    Baseline Foot FOTO 67 (07/24/2022)    Time 8    Period Weeks    Status New    Target Date 09/22/22      PT LONG TERM GOAL #4   Title Pt will improve his DGI score to at least 19 points as a demonstration of improved balance and decreased fall risk.    Baseline DGI 13 (07/24/2022)    Time 8    Period Weeks    Status New    Target  Date 09/22/22              Plan - 08/23/22 1520     Clinical Impression Statement Continued working on B LE strengthening as well as challenging closed chain ankle stability. Good LE muscle use felt with exercises observed and reported. Pt tolerated session well without aggravation of symptoms.  Pt will benefit from continued skilled physical therapy services to improve strength, balance, function, and decrease fall risk.    Personal Factors and Comorbidities Comorbidity 3+;Age;Past/Current Experience;Time since onset of injury/illness/exacerbation;Profession;Fitness    Comorbidities HTN, DM, neuropathy    Examination-Activity Limitations Stand;Lift;Carry;Bend;Locomotion Level;Dressing;Squat;Transfers;Stairs    Stability/Clinical Decision Making Evolving/Moderate complexity   Balance seem to be worsening based on subjective reports.   Rehab Potential Fair    PT Frequency 2x / week    PT Duration 8 weeks    PT Treatment/Interventions Therapeutic exercise;Balance training;Functional mobility training;Stair training;Therapeutic activities;Neuromuscular re-education;Patient/family education;Manual techniques;Electrical Stimulation;Gait training    PT Next Visit Plan lumbopelvic strengthening, stability and control, B hip, thigh, and ankle strengthening, balance, gait, manual techniques,  modalities PRN.    PT Home Exercise Plan Medbridge Access Code: [email or web address]    Consulted and Agree with Plan of Care Patient               Loralyn Freshwater PT, DPT  08/23/2022, 4:19 PM

## 2022-08-27 NOTE — Patient Instructions (Signed)
Be Involved in Caring For Your Health:  Taking Medications When medications are taken as directed, they can greatly improve your health. But if they are not taken as prescribed, they may not work. In some cases, not taking them correctly can be harmful. To help ensure your treatment remains effective and safe, understand your medications and how to take them. Bring your medications to each visit for review by your provider.  Your lab results, notes, and after visit summary will be available on My Chart. We strongly encourage you to use this feature. If lab results are abnormal the clinic will contact you with the appropriate steps. If the clinic does not contact you assume the results are satisfactory. You can always view your results on My Chart. If you have questions regarding your health or results, please contact the clinic during office hours. You can also ask questions on My Chart.  We at [facility] are grateful that you chose us to provide your care. We strive to provide evidence-based and compassionate care and are always looking for feedback. If you get a survey from the clinic please complete this so we can hear your opinions.  Diabetes Mellitus Basics  Diabetes mellitus, or diabetes, is a long-term (chronic) disease. It occurs when the body does not properly use sugar (glucose) that is released from food after you eat. Diabetes mellitus may be caused by one or both of these problems: Your pancreas does not make enough of a hormone called insulin. Your body does not react in a normal way to the insulin that it makes. Insulin lets glucose enter cells in your body. This gives you energy. If you have diabetes, glucose cannot get into cells. This causes high blood glucose (hyperglycemia). How to treat and manage diabetes You may need to take insulin or other diabetes medicines daily to keep your glucose in balance. If you are prescribed insulin, you will learn how to give  yourself insulin by injection. You may need to adjust the amount of insulin you take based on the foods that you eat. You will need to check your blood glucose levels using a glucose monitor as told by your health care provider. The readings can help determine if you have low or high blood glucose. Generally, you should have these blood glucose levels: Before meals (preprandial): 80-130 mg/dL (4.4-7.2 mmol/L). After meals (postprandial): below 180 mg/dL (10 mmol/L). Hemoglobin A1c (HbA1c) level: less than 7%. Your health care provider will set treatment goals for you. Keep all follow-up visits. This is important. Follow these instructions at home: Diabetes medicines Take your diabetes medicines every day as told by your health care provider. List your diabetes medicines here: Name of medicine: ______________________________ Amount (dose): _______________ Time (a.m./p.m.): _______________ Notes: ___________________________________ Name of medicine: ______________________________ Amount (dose): _______________ Time (a.m./p.m.): _______________ Notes: ___________________________________ Name of medicine: ______________________________ Amount (dose): _______________ Time (a.m./p.m.): _______________ Notes: ___________________________________ Insulin If you use insulin, list the types of insulin you use here: Insulin type: ______________________________ Amount (dose): _______________ Time (a.m./p.m.): _______________Notes: ___________________________________ Insulin type: ______________________________ Amount (dose): _______________ Time (a.m./p.m.): _______________ Notes: ___________________________________ Insulin type: ______________________________ Amount (dose): _______________ Time (a.m./p.m.): _______________ Notes: ___________________________________ Insulin type: ______________________________ Amount (dose): _______________ Time (a.m./p.m.): _______________ Notes:  ___________________________________ Insulin type: ______________________________ Amount (dose): _______________ Time (a.m./p.m.): _______________ Notes: ___________________________________ Managing blood glucose  Check your blood glucose levels using a glucose monitor as told by your health care provider. Write down the times that you check your glucose levels here: Time: _______________ Notes: ___________________________________   Time: _______________ Notes: ___________________________________ Time: _______________ Notes: ___________________________________ Time: _______________ Notes: ___________________________________ Time: _______________ Notes: ___________________________________ Time: _______________ Notes: ___________________________________  Low blood glucose Low blood glucose (hypoglycemia) is when glucose is at or below 70 mg/dL (3.9 mmol/L). Symptoms may include: Feeling: Hungry. Sweaty and clammy. Irritable or easily upset. Dizzy. Sleepy. Having: A fast heartbeat. A headache. A change in your vision. Numbness around the mouth, lips, or tongue. Having trouble with: Moving (coordination). Sleeping. Treating low blood glucose To treat low blood glucose, eat or drink something containing sugar right away. If you can think clearly and swallow safely, follow the 15:15 rule: Take 15 grams of a fast-acting carb (carbohydrate), as told by your health care provider. Some fast-acting carbs are: Glucose tablets: take 3-4 tablets. Hard candy: eat 3-5 pieces. Fruit juice: drink 4 oz (120 mL). Regular (not diet) soda: drink 4-6 oz (120-180 mL). Honey or sugar: eat 1 Tbsp (15 mL). Check your blood glucose levels 15 minutes after you take the carb. If your glucose is still at or below 70 mg/dL (3.9 mmol/L), take 15 grams of a carb again. If your glucose does not go above 70 mg/dL (3.9 mmol/L) after 3 tries, get help right away. After your glucose goes back to normal, eat a meal  or a snack within 1 hour. Treating very low blood glucose If your glucose is at or below 54 mg/dL (3 mmol/L), you have very low blood glucose (severe hypoglycemia). This is an emergency. Do not wait to see if the symptoms will go away. Get medical help right away. Call your local emergency services (911 in the [place] Do not drive yourself to the hospital. Questions to ask your health care provider Should I talk with a diabetes educator? What equipment will I need to care for myself at home? What diabetes medicines do I need? When should I take them? How often do I need to check my blood glucose levels? What number can I call if I have questions? When is my follow-up visit? Where can I find a support group for people with diabetes? Where to find more information American Diabetes Association: www.diabetes.org Association of Diabetes Care and Education Specialists: www.diabeteseducator.org Contact a health care provider if: Your blood glucose is at or above 240 mg/dL (13.3 mmol/L) for 2 days in a row. You have been sick or have had a fever for 2 days or more, and you are not getting better. You have any of these problems for more than 6 hours: You cannot eat or drink. You feel nauseous. You vomit. You have diarrhea. Get help right away if: Your blood glucose is lower than 54 mg/dL (3 mmol/L). You get confused. You have trouble thinking clearly. You have trouble breathing. These symptoms may represent a serious problem that is an emergency. Do not wait to see if the symptoms will go away. Get medical help right away. Call your local emergency services (911 in the [place] Do not drive yourself to the hospital. Summary Diabetes mellitus is a chronic disease that occurs when the body does not properly use sugar (glucose) that is released from food after you eat. Take insulin and diabetes medicines as told. Check your blood glucose every day, as often as told. Keep all follow-up visits. This  is important. This information is not intended to replace advice given to you by your health care provider. Make sure you discuss any questions you have with your health care provider. Document Revised: 06/10/2019 Document Reviewed: 06/10/2019 Elsevier Patient Education    2024 Elsevier Inc.  

## 2022-08-28 ENCOUNTER — Ambulatory Visit: Payer: Medicare Other

## 2022-08-28 DIAGNOSIS — Z9181 History of falling: Secondary | ICD-10-CM | POA: Diagnosis not present

## 2022-08-28 DIAGNOSIS — R262 Difficulty in walking, not elsewhere classified: Secondary | ICD-10-CM

## 2022-08-28 DIAGNOSIS — M6281 Muscle weakness (generalized): Secondary | ICD-10-CM | POA: Diagnosis not present

## 2022-08-28 DIAGNOSIS — R2681 Unsteadiness on feet: Secondary | ICD-10-CM | POA: Diagnosis not present

## 2022-08-28 NOTE — Therapy (Signed)
OUTPATIENT PHYSICAL THERAPY TREATMENT NOTE And Progress Report (07/24/2022 - 08/28/2022)   Patient Name: Tyler Morgan MRN: [id number] DOB:Jun 27, 1945, 77 y.o., male Today's Date: 08/28/2022  PCP: [name], NP  REFERRING PROVIDER: [name], NP   END OF SESSION:  PT End of Session - 08/28/22 1600     Visit Number 10    Number of Visits 17    Date for PT Re-Evaluation 09/21/22    Progress Note Due on Visit 10    PT Start Time 1522    PT Stop Time 1604    PT Time Calculation (min) 42 min    Activity Tolerance Patient tolerated treatment well    Behavior During Therapy WFL for tasks assessed/performed                     Past Medical History:  Diagnosis Date   Diabetes mellitus without complication (HCC)    ED (erectile dysfunction)    Hypertension    Motion sickness    boats   Neuropathy    Prostate enlargement    Urinary retention    Past Surgical History:  Procedure Laterality Date   BASAL CELL CARCINOMA EXCISION     nose    CATARACT EXTRACTION Bilateral 1986, 2014   COLONOSCOPY WITH PROPOFOL N/A 03/27/2018   Procedure: COLONOSCOPY WITH BIOPSIES;  Surgeon: Toney Reil, MD;  Location: [facility];  Service: Endoscopy;  Laterality: N/A;  Diabetic - oral meds   EYE SURGERY Bilateral [phone]   FRACTURE SURGERY Right    as a child/ right arm   HERNIA REPAIR Right 01-13-14   inguinal hernia   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 N/A 02/07/2017   Procedure: 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;  Surgeon: Vanna Scotland, MD;  Location: [facility];  Service: Urology;  Laterality: N/A;   POLYPECTOMY N/A 03/27/2018   Procedure: POLYPECTOMY;  Surgeon: Toney Reil, MD;  Location: [facility];  Service: Endoscopy;  Laterality: N/A;   RETINAL DETACHMENT SURGERY  Jan 2015   TONSILLECTOMY  1955   Patient Active Problem List   Diagnosis Date Noted   PVD (peripheral vascular  disease) (HCC) 11/30/2021   B12 deficiency 01/29/2019   History of prostate cancer 12/13/2017   Hemorrhoids 12/13/2017   Hyperlipidemia associated with type 2 diabetes mellitus (HCC) 04/02/2017   Advanced care planning/counseling discussion 10/11/2016   Type 2 diabetes mellitus with diabetic neuropathy (HCC) 12/15/2014   Hypertension associated with diabetes (HCC)     REFERRING DIAG: E11.40 (ICD-10-CM) - Type 2 diabetes mellitus with diabetic neuropathy, without long-term current use of insulin (HCC)   THERAPY DIAG:  Unsteadiness on feet  Difficulty in walking, not elsewhere classified  Muscle weakness (generalized)  History of falling  Rationale for Evaluation and Treatment Rehabilitation  PERTINENT HISTORY: Balance, weakness. Can get around well without a cane. Standing still, pt loses balance. Has to hold onto things to stabilize himself. Has labradore thats about 77 year old and can walk his dog without a leash because the dog's pulling stabilizes him. Symptoms have been going on for a while and was told to have diabetic neuropathy. Had a fall in 2015 while working on the flooring in his den at home which caused R inguinal hernia S/P mesh placement. Pt states noticing problems in his legs since 2015. Pt also could not push up on his toes (tip toe) since then. No back pain currently but has had a hx of  back pain over the years. Decreased sensation B feet, almost not noticeable. Feels like he has a lot of weakness which makes it difficult to keep his balance. Has B LE swelling, comes and goes, not currently getting treated for it. No low back pain, just L arm and shoulder.   PRECAUTIONS: Fall risk; L5 anterolisthesis   SUBJECTIVE:   SUBJECTIVE STATEMENT: Doing pretty well until today. Too much outdoors trying to tear down a plant that did not want to be torn apart. No pain from the waist to feet.     PAIN:  Are you having pain? See subjective   TODAY'S TREATMENT:                                                                                                                                          DATE: 08/28/2022      Per MRI on 12/26/2016: 1. 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 There is probable chronic bilateral L5 nerve root encroachment.      No latex allergies.      Therapeutic exercise    Prone with 2 pillows under abdomen manually resisted hip extension, and S/L hip abduction 1x each way for each LE  Manually resisted ankle PF, DF, EV, IV 1x each way for each LE   Directed patient with gait with normal gait speed, with changes in speed, 180 degree pivot turn, with R and L cervical rotation position, with cervical flexion and extension position, stepping around obstacles, stepping over an obstacle, ascending and descending 4 regular steps with UE assist   Reviewed progress/current status with PT toward goals.    Gait with R and L head turns 32 ft x 2  Improved steadiness    Improved exercise technique, movement at target joints, use of target muscles after mod verbal, visual, tactile cues.        Manual therapy  Seated STM R cervical paraspinal and upper trap muscles to promote ability to turn his head, decrease neck stiffness, and increase stability with gait with R and L head turns  Improved steadiness with gait with R and L head turns afterwards       Response to treatment Pt tolerated session well without aggravation of symptoms.       Clinical impression  Pt demonstrates improved B hip extension and abduction strength, minimal improvement in ankle DF and PF strength, improved DGI score by 3 points suggesting improved balance since initial evaluation. Decreased unsteadiness with gait during R and L head turns after manual therapy to decrease muscle tension to his neck. Pt tolerated session well without aggravation of symptoms.  Pt will  benefit from continued skilled physical therapy services to improve strength, balance, function, and decrease fall risk.         PATIENT EDUCATION: Education details: there-ex, HEP Person educated:  Patient Education method: Explanation, Demonstration, Tactile cues, Verbal cues, and Handouts Education comprehension: verbalized understanding and returned demonstration  HOME EXERCISE PROGRAM: Access Code: [email or web address] URL: [email or web address] Date: 07/24/2022 Prepared by: Loralyn Freshwater   Exercises - Seated Transversus Abdominis Bracing  - 5 x daily - 7 x weekly - 3 sets - 10 reps - 5 seconds hold  - Supine Posterior Pelvic Tilt  - 1 x daily - 7 x weekly - 3 sets - 10 reps - 5 seconds hold - Long Sitting Ankle Plantar Flexion with Resistance  - 1 x daily - 7 x weekly - 3 sets - 10 reps  - Long Sitting Ankle Dorsiflexion AROM  - 1 x daily - 7 x weekly - 3 sets - 10 reps - 5 seconds hold  - Supine March with Posterior Pelvic Tilt  - 1 x daily - 7 x weekly - 3 sets - 10 reps - Curl Up with Arms Crossed  - 1 x daily - 7 x weekly - 3 sets - 10 reps - Supine Double Knee to Chest  - 1 x daily - 7 x weekly - 3 sets - 10 reps - 5 seconds hold - Bent Knee Fallouts with Alternating Legs  - 1 x daily - 7 x weekly - 3 sets - 10 reps - Standing Posterior Pelvic Tilt  - 3 x daily - 7 x weekly - 3 sets - 10 reps - 5 seconds hold  - Mini Squat with Counter Support  - 1 x daily - 7 x weekly - 3 sets - 10 reps  - Standing Hip Abduction with Resistance at Ankles and Counter Support  - 1 x daily - 7 x weekly - 1-3 sets - 10 reps - 5 seconds hold      PT Short Term Goals - 08/28/22 1617       PT SHORT TERM GOAL #1   Title Pt will be independent with his initial HEP to improve strength, balance, and function.    Baseline Pt has started his HEP. (07/24/2022); pt demonstrates independence with his inital HEP (08/28/2022)    Time 3    Period Weeks    Status Achieved    Target Date 08/18/22               PT Long Term Goals - 08/28/22 1529       PT LONG TERM GOAL #1   Title Pt will improve B ankle PF, DF, EV, and IV strength by at least 1/2 MMT grade to promote ability to ambulate, as well as perform standing tasks with less difficulty and decreased fall risk.    Baseline Manually resisted ankle: PF 3+/5 R, and L;  DF 3+/5 R, 3/5 L; EV 4-/5 R, 3+/5 L; IV 3+/5 R and L (07/24/2022); Manually resisted ankle PF 4-/5 R, 3+/5 L,  DF 3+/5 R, 3+/5 L, EV 4-/5 R, 3+/5 L, IV 3+/5 R, 3+/5 L (08/28/2022)    Time 8    Period Weeks    Status Partially Met    Target Date 09/22/22      PT LONG TERM GOAL #2   Title Pt will improve B hip extension and abduction strength to promote ability to perform standing tasks with less difficulty and decreased fall risk.    Baseline Hip extension 3+/5 R and L; hip abduction 4/5 R and L (07/24/2022); hip extension 4/5 R and L, hip abduction 4+/5 R and L (08/28/2022)    Time 8  Period Weeks    Status Achieved    Target Date 09/22/22      PT LONG TERM GOAL #3   Title Pt will improve his FOTO score by at least 10 points as a demonsration of improved function.    Baseline Foot FOTO 67 (07/24/2022)    Time 8    Period Weeks    Status On-going    Target Date 09/22/22      PT LONG TERM GOAL #4   Title Pt will improve his DGI score to at least 19 points as a demonstration of improved balance and decreased fall risk.    Baseline DGI 13 (07/24/2022); 16 (08/28/2022)    Time 8    Period Weeks    Status Partially Met    Target Date 09/22/22              Plan - 08/28/22 1514     Clinical Impression Statement Pt demonstrates improved B hip extension and abduction strength, minimal improvement in ankle DF and PF strength, improved DGI score by 3 points suggesting improved balance since initial evaluation. Decreased unsteadiness with gait during R and L head turns after manual therapy to decrease muscle tension to his neck. Pt tolerated session well without  aggravation of symptoms.  Pt will benefit from continued skilled physical therapy services to improve strength, balance, function, and decrease fall risk.    Personal Factors and Comorbidities Comorbidity 3+;Age;Past/Current Experience;Time since onset of injury/illness/exacerbation;Profession;Fitness    Comorbidities HTN, DM, neuropathy    Examination-Activity Limitations Stand;Lift;Carry;Bend;Locomotion Level;Dressing;Squat;Transfers;Stairs    Stability/Clinical Decision Making Stable/Uncomplicated   Balance seem to be worsening based on subjective reports.   Clinical Decision Making Low    Rehab Potential Fair    PT Frequency 2x / week    PT Duration 8 weeks    PT Treatment/Interventions Therapeutic exercise;Balance training;Functional mobility training;Stair training;Therapeutic activities;Neuromuscular re-education;Patient/family education;Manual techniques;Electrical Stimulation;Gait training    PT Next Visit Plan lumbopelvic strengthening, stability and control, B hip, thigh, and ankle strengthening, balance, gait, manual techniques, modalities PRN.    PT Home Exercise Plan Medbridge Access Code: [email or web address]    Consulted and Agree with Plan of Care Patient             Thank you for your referral.   Loralyn Freshwater PT, DPT  08/28/2022, 4:20 PM

## 2022-08-29 ENCOUNTER — Other Ambulatory Visit: Payer: Self-pay | Admitting: Nurse Practitioner

## 2022-08-30 ENCOUNTER — Ambulatory Visit: Payer: Medicare Other

## 2022-08-30 DIAGNOSIS — Z9181 History of falling: Secondary | ICD-10-CM | POA: Diagnosis not present

## 2022-08-30 DIAGNOSIS — R2681 Unsteadiness on feet: Secondary | ICD-10-CM

## 2022-08-30 DIAGNOSIS — M6281 Muscle weakness (generalized): Secondary | ICD-10-CM

## 2022-08-30 DIAGNOSIS — R262 Difficulty in walking, not elsewhere classified: Secondary | ICD-10-CM | POA: Diagnosis not present

## 2022-08-30 NOTE — Telephone Encounter (Signed)
Requested Prescriptions  Pending Prescriptions Disp Refills   rosuvastatin (CRESTOR) 10 MG tablet [Pharmacy Med Name: ROSUVASTATIN CALCIUM 10 MG TAB] 12 tablet 0    Sig: TAKE ONE (1) TABLET BY MOUTH THREE TIMESPER WEEK     Cardiovascular:  Antilipid - Statins 2 Failed - 08/29/2022 10:03 PM      Failed - Cr in normal range and within 360 days    Creatinine  Date Value Ref Range Status  01/05/2014 0.75 0.60 - 1.30 mg/dL Final   Creatinine, Ser  Date Value Ref Range Status  06/02/2022 0.71 (L) 0.76 - 1.27 mg/dL Final         Failed - Lipid Panel in normal range within the last 12 months    Cholesterol, Total  Date Value Ref Range Status  06/02/2022 143 100 - 199 mg/dL Final   Cholesterol Piccolo, Waived  Date Value Ref Range Status  07/30/2018 131 <200 mg/dL Final    Comment:                            Desirable                <200                         Borderline High      200- 239                         High                     >239    LDL Chol Calc (NIH)  Date Value Ref Range Status  06/02/2022 57 0 - 99 mg/dL Final   HDL  Date Value Ref Range Status  06/02/2022 47 >39 mg/dL Final   Triglycerides  Date Value Ref Range Status  06/02/2022 244 (H) 0 - 149 mg/dL Final   Triglycerides Piccolo,Waived  Date Value Ref Range Status  07/30/2018 121 <150 mg/dL Final    Comment:                            Normal                   <150                         Borderline High     150 - 199                         High                200 - 499                         Very High                >499          Passed - Patient is not pregnant      Passed - Valid encounter within last 12 months    Recent Outpatient Visits           2 months ago Type 2 diabetes mellitus with diabetic neuropathy, without long-term current use of insulin (HCC)   [place] [facility] [place], Dorie Rank, NP  9 months ago Type 2 diabetes mellitus with diabetic neuropathy, without  long-term current use of insulin (HCC)   [place] [facility] [place], [place] T, NP   1 year ago Type 2 diabetes mellitus with diabetic neuropathy, without long-term current use of insulin (HCC)   [facility] [facility] [place], Corrie Dandy T, NP   1 year ago Acute cough   [place] [facility] [place], Lauren A, NP   1 year ago Type 2 diabetes mellitus with diabetic neuropathy, without long-term current use of insulin (HCC)   [place] [facility] [place], Dorie Rank, NP       Future Appointments             In 2 days [name], NP [place] [facility]   In 4 months Vanna Scotland, MD [facility]

## 2022-08-30 NOTE — Therapy (Signed)
OUTPATIENT PHYSICAL THERAPY TREATMENT NOTE    Patient Name: Tyler Morgan MRN: [id number] DOB:14-Apr-1945, 77 y.o., male Today's Date: 08/30/2022  PCP: [name], NP  REFERRING PROVIDER: [name], NP   END OF SESSION:  PT End of Session - 08/30/22 1526     Visit Number 11    Number of Visits 17    Date for PT Re-Evaluation 09/21/22    Progress Note Due on Visit 10    PT Start Time 1526    PT Stop Time 1606    PT Time Calculation (min) 40 min    Activity Tolerance Patient tolerated treatment well    Behavior During Therapy WFL for tasks assessed/performed                      Past Medical History:  Diagnosis Date   Diabetes mellitus without complication (HCC)    ED (erectile dysfunction)    Hypertension    Motion sickness    boats   Neuropathy    Prostate enlargement    Urinary retention    Past Surgical History:  Procedure Laterality Date   BASAL CELL CARCINOMA EXCISION     nose    CATARACT EXTRACTION Bilateral 1986, 2014   COLONOSCOPY WITH PROPOFOL N/A 03/27/2018   Procedure: COLONOSCOPY WITH BIOPSIES;  Surgeon: Toney Reil, MD;  Location: [facility];  Service: Endoscopy;  Laterality: N/A;  Diabetic - oral meds   EYE SURGERY Bilateral [phone]   FRACTURE SURGERY Right    as a child/ right arm   HERNIA REPAIR Right 01-13-14   inguinal hernia   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 N/A 02/07/2017   Procedure: 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;  Surgeon: Vanna Scotland, MD;  Location: [facility];  Service: Urology;  Laterality: N/A;   POLYPECTOMY N/A 03/27/2018   Procedure: POLYPECTOMY;  Surgeon: Toney Reil, MD;  Location: [facility];  Service: Endoscopy;  Laterality: N/A;   RETINAL DETACHMENT SURGERY  Jan 2015   TONSILLECTOMY  1955   Patient Active Problem List   Diagnosis Date Noted   PVD (peripheral vascular disease) (HCC) 11/30/2021   B12  deficiency 01/29/2019   History of prostate cancer 12/13/2017   Hemorrhoids 12/13/2017   Hyperlipidemia associated with type 2 diabetes mellitus (HCC) 04/02/2017   Advanced care planning/counseling discussion 10/11/2016   Type 2 diabetes mellitus with diabetic neuropathy (HCC) 12/15/2014   Hypertension associated with diabetes (HCC)     REFERRING DIAG: E11.40 (ICD-10-CM) - Type 2 diabetes mellitus with diabetic neuropathy, without long-term current use of insulin (HCC)   THERAPY DIAG:  Unsteadiness on feet  Difficulty in walking, not elsewhere classified  Muscle weakness (generalized)  History of falling  Rationale for Evaluation and Treatment Rehabilitation  PERTINENT HISTORY: Balance, weakness. Can get around well without a cane. Standing still, pt loses balance. Has to hold onto things to stabilize himself. Has labradore thats about 77 year old and can walk his dog without a leash because the dog's pulling stabilizes him. Symptoms have been going on for a while and was told to have diabetic neuropathy. Had a fall in 2015 while working on the flooring in his den at home which caused R inguinal hernia S/P mesh placement. Pt states noticing problems in his legs since 2015. Pt also could not push up on his toes (tip toe) since then. No back pain currently but has had a hx of back pain over the  years. Decreased sensation B feet, almost not noticeable. Feels like he has a lot of weakness which makes it difficult to keep his balance. Has B LE swelling, comes and goes, not currently getting treated for it. No low back pain, just L arm and shoulder.   PRECAUTIONS: Fall risk; L5 anterolisthesis   SUBJECTIVE:   SUBJECTIVE STATEMENT: Walking is pretty good. Walks his dog which helps. Neck felt better after last session.     PAIN:  Are you having pain? See subjective   TODAY'S TREATMENT:                                                                                                                                          DATE: 08/30/2022      Per MRI on 12/26/2016: 1. 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 There is probable chronic bilateral L5 nerve root encroachment.      No latex allergies.      Therapeutic exercise   Sit <> stand from chair without  Air Ex pad and without UE assist, CGA to min A               5x3   Forward weight shifting onto Air Ex pad with contralateral UE assist                 R 10x5 seconds for 3 sets                  L 10x5 seconds for 3 sets   Standing with B UE assist   With heels on 10 lbs plates to elevate heels and challenge gastroc muscles more                Forward weight shifting onto B forefeet with emphasis on ankle strategy 10x5 seconds for 3 sets.    Seated B ankle heel raises and DF 10x5 seconds for 2 sets each direction    Recommended for pt to perform scapular retractions and chin tucks at home (sitting) to help continue to decrease neck stiffness. Pt demonstrated and verbalized understanding.    Improved exercise technique, movement at target joints, use of target muscles after mod verbal, visual, tactile cues.        Manual therapy  Seated STM R and L cervical paraspinal and upper trap muscles to promote ability to turn his head, decrease neck stiffness, and increase stability with gait with R and L head turns        Response to treatment Pt tolerated session well without aggravation of symptoms.       Clinical impression  Continued working on general LE strengthening, especially his ankles to improve stability during gait. Better able to perform sit <> stand transfers from regular chair height without UE assist today. Continued with manual therapy to decrease muscle stiffness  to neck to improve steadiness while ambulating and turning his head. Pt tolerated session well without aggravation of symptoms.  Pt will  benefit from continued skilled physical therapy services to improve strength, balance, function, and decrease fall risk.         PATIENT EDUCATION: Education details: there-ex, HEP Person educated: Patient Education method: Explanation, Demonstration, Tactile cues, Verbal cues, and Handouts Education comprehension: verbalized understanding and returned demonstration  HOME EXERCISE PROGRAM: Access Code: [email or web address] URL: [email or web address] Date: 07/24/2022 Prepared by: Loralyn Freshwater   Exercises - Seated Transversus Abdominis Bracing  - 5 x daily - 7 x weekly - 3 sets - 10 reps - 5 seconds hold  - Supine Posterior Pelvic Tilt  - 1 x daily - 7 x weekly - 3 sets - 10 reps - 5 seconds hold - Long Sitting Ankle Plantar Flexion with Resistance  - 1 x daily - 7 x weekly - 3 sets - 10 reps  - Long Sitting Ankle Dorsiflexion AROM  - 1 x daily - 7 x weekly - 3 sets - 10 reps - 5 seconds hold  - Supine March with Posterior Pelvic Tilt  - 1 x daily - 7 x weekly - 3 sets - 10 reps - Curl Up with Arms Crossed  - 1 x daily - 7 x weekly - 3 sets - 10 reps - Supine Double Knee to Chest  - 1 x daily - 7 x weekly - 3 sets - 10 reps - 5 seconds hold - Bent Knee Fallouts with Alternating Legs  - 1 x daily - 7 x weekly - 3 sets - 10 reps - Standing Posterior Pelvic Tilt  - 3 x daily - 7 x weekly - 3 sets - 10 reps - 5 seconds hold  - Mini Squat with Counter Support  - 1 x daily - 7 x weekly - 3 sets - 10 reps  - Standing Hip Abduction with Resistance at Ankles and Counter Support  - 1 x daily - 7 x weekly - 1-3 sets - 10 reps - 5 seconds hold   Recommended for pt to perform scapular retractions and chin tucks at home (sitting) to help continue to decrease neck stiffness. Pt demonstrated and verbalized understanding.    PT Short Term Goals - 08/28/22 1617       PT SHORT TERM GOAL #1   Title Pt will be independent with his initial HEP to improve strength, balance, and function.     Baseline Pt has started his HEP. (07/24/2022); pt demonstrates independence with his inital HEP (08/28/2022)    Time 3    Period Weeks    Status Achieved    Target Date 08/18/22              PT Long Term Goals - 08/28/22 1529       PT LONG TERM GOAL #1   Title Pt will improve B ankle PF, DF, EV, and IV strength by at least 1/2 MMT grade to promote ability to ambulate, as well as perform standing tasks with less difficulty and decreased fall risk.    Baseline Manually resisted ankle: PF 3+/5 R, and L;  DF 3+/5 R, 3/5 L; EV 4-/5 R, 3+/5 L; IV 3+/5 R and L (07/24/2022); Manually resisted ankle PF 4-/5 R, 3+/5 L,  DF 3+/5 R, 3+/5 L, EV 4-/5 R, 3+/5 L, IV 3+/5 R, 3+/5 L (08/28/2022)    Time 8    Period Weeks    Status Partially  Met    Target Date 09/22/22      PT LONG TERM GOAL #2   Title Pt will improve B hip extension and abduction strength to promote ability to perform standing tasks with less difficulty and decreased fall risk.    Baseline Hip extension 3+/5 R and L; hip abduction 4/5 R and L (07/24/2022); hip extension 4/5 R and L, hip abduction 4+/5 R and L (08/28/2022)    Time 8    Period Weeks    Status Achieved    Target Date 09/22/22      PT LONG TERM GOAL #3   Title Pt will improve his FOTO score by at least 10 points as a demonsration of improved function.    Baseline Foot FOTO 67 (07/24/2022)    Time 8    Period Weeks    Status On-going    Target Date 09/22/22      PT LONG TERM GOAL #4   Title Pt will improve his DGI score to at least 19 points as a demonstration of improved balance and decreased fall risk.    Baseline DGI 13 (07/24/2022); 16 (08/28/2022)    Time 8    Period Weeks    Status Partially Met    Target Date 09/22/22              Plan - 08/30/22 1525     Clinical Impression Statement Continued working on general LE strengthening, especially his ankles to improve stability during gait. Better able to perform sit <> stand transfers from regular chair height  without UE assist today. Continued with manual therapy to decrease muscle stiffness to neck to improve steadiness while ambulating and turning his head. Pt tolerated session well without aggravation of symptoms.  Pt will benefit from continued skilled physical therapy services to improve strength, balance, function, and decrease fall risk.    Personal Factors and Comorbidities Comorbidity 3+;Age;Past/Current Experience;Time since onset of injury/illness/exacerbation;Profession;Fitness    Comorbidities HTN, DM, neuropathy    Examination-Activity Limitations Stand;Lift;Carry;Bend;Locomotion Level;Dressing;Squat;Transfers;Stairs    Stability/Clinical Decision Making Stable/Uncomplicated   Balance seem to be worsening based on subjective reports.   Rehab Potential Fair    PT Frequency 2x / week    PT Duration 8 weeks    PT Treatment/Interventions Therapeutic exercise;Balance training;Functional mobility training;Stair training;Therapeutic activities;Neuromuscular re-education;Patient/family education;Manual techniques;Electrical Stimulation;Gait training    PT Next Visit Plan lumbopelvic strengthening, stability and control, B hip, thigh, and ankle strengthening, balance, gait, manual techniques, modalities PRN.    PT Home Exercise Plan Medbridge Access Code: [email or web address]    Consulted and Agree with Plan of Care Patient              Loralyn Freshwater PT, DPT  08/30/2022, 4:14 PM

## 2022-09-01 ENCOUNTER — Ambulatory Visit (INDEPENDENT_AMBULATORY_CARE_PROVIDER_SITE_OTHER): Payer: Medicare Other | Admitting: Nurse Practitioner

## 2022-09-01 ENCOUNTER — Encounter: Payer: Self-pay | Admitting: Nurse Practitioner

## 2022-09-01 VITALS — BP 148/78 | HR 62 | Temp 97.9°F | Ht 65.98 in | Wt 184.4 lb

## 2022-09-01 DIAGNOSIS — E1140 Type 2 diabetes mellitus with diabetic neuropathy, unspecified: Secondary | ICD-10-CM | POA: Diagnosis not present

## 2022-09-01 DIAGNOSIS — E785 Hyperlipidemia, unspecified: Secondary | ICD-10-CM

## 2022-09-01 DIAGNOSIS — E1159 Type 2 diabetes mellitus with other circulatory complications: Secondary | ICD-10-CM | POA: Diagnosis not present

## 2022-09-01 DIAGNOSIS — E1169 Type 2 diabetes mellitus with other specified complication: Secondary | ICD-10-CM | POA: Diagnosis not present

## 2022-09-01 DIAGNOSIS — Z8546 Personal history of malignant neoplasm of prostate: Secondary | ICD-10-CM

## 2022-09-01 DIAGNOSIS — I739 Peripheral vascular disease, unspecified: Secondary | ICD-10-CM | POA: Diagnosis not present

## 2022-09-01 DIAGNOSIS — I152 Hypertension secondary to endocrine disorders: Secondary | ICD-10-CM

## 2022-09-01 LAB — BAYER DCA HB A1C WAIVED: HB A1C (BAYER DCA - WAIVED): 7 % — ABNORMAL HIGH (ref 4.8–5.6)

## 2022-09-01 MED ORDER — HYDROCHLOROTHIAZIDE 12.5 MG PO TABS: 12.5000 mg | ORAL_TABLET | Freq: Every day | ORAL | 1 refills | Status: DC

## 2022-09-01 NOTE — Assessment & Plan Note (Addendum)
Chronic, ongoing with BP elevated today on recheck and initial.  Urine ALB 20 June 2022.  Continue current medication regimen at this time, Benazepril offering kidney protection.  Add on Hydrochlorothiazide 12.5 MG daily to regimen, he has taken before and tolerated.  Can not take Amlodipine, although this would offer benefit.  Recommend checking BP a few mornings a week at home and documenting + focus on DASH diet.  Monitor for worsening edema and may provide PRN Lasix if presents, suspect some underlyin PVD based on exam.  Recommend avoid Ibuprofen products and high salt intake.  Return in 4 weeks.  Labs today: CMP.

## 2022-09-01 NOTE — Assessment & Plan Note (Signed)
Suspect some underlying PVD based on exam with diminished lower extremity hair pattern, edema at ankles, and dry skin.  At this time monitor for wounds and continue frequent foot exams.  To notify provider if any wounds present or pain. 

## 2022-09-01 NOTE — Progress Notes (Signed)
BP (!) 148/78 (BP Location: Left Arm, Patient Position: Sitting, Cuff Size: Normal)   Pulse 62   Temp 97.9 F (36.6 C) (Oral)   Ht 5' 5.98" (1.676 m)   Wt 184 lb 6.4 oz (83.6 kg)   SpO2 97%   BMI 29.78 kg/m    Subjective:    Patient ID: Tyler Morgan, male    DOB: Dec 12, 1945, 77 y.o.   MRN: [id number]  HPI: Tyler Morgan is a 77 y.o. male  Chief Complaint  Patient presents with   Diabetes   Hypertension   Hyperlipidemia   DIABETES A1c April 7.7%, he did not want to add on medication, focused on diet.  Continues Metformin 500 MG TID.  Was on Jardiance in past, on for less then a year -- he tolerated this.    Currently attending PT for leg weakness + at home continues exercise -- feels like he is getting a little more strength.  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 Taking Alpha Lipoic Acid and B12 daily.  He is concerned for having CIDP, wife saw commercial on television.  Hypoglycemic episodes:no Polydipsia/polyuria: no Visual disturbance: no Chest pain: no Paresthesias: no Glucose Monitoring: no  Accucheck frequency: Not Checking  Fasting glucose:  Post prandial:  Evening:  Before meals: Taking Insulin?: no  Long acting insulin:  Short acting insulin: Blood Pressure Monitoring: rarely Retinal Examination: Up To Date - [facility] goes next week Foot Exam: Up to Date Pneumovax: Up to Date Influenza: Up to Date Aspirin: no   HYPERTENSION / HYPERLIPIDEMIA Continues on Benazepril, Atenolol, + Crestor.  Has not been taking Ibuprofen recently. Satisfied with current treatment? yes Duration of hypertension: chronic BP monitoring frequency: not checking BP range:  BP medication side effects: no Duration of hyperlipidemia: chronic Cholesterol medication side effects: no Cholesterol supplements: none Medication compliance: good compliance Aspirin:  no Recent stressors: no Recurrent headaches: no Visual changes: no Palpitations: no Dyspnea: no Chest pain: no Lower extremity edema: no Dizzy/lightheaded: no   PROSTATE CANCER: Followed by Dr. Apolinar Junes with urology and last saw 01/11/22, was told he was cancer free in 2021.  Continues to have monitoring of PSA, every 6 months, recent May was 1.9. Denies any symptoms at this time.   Relevant past medical, surgical, family and social history reviewed and updated as indicated. Interim medical history since our last visit reviewed. Allergies and medications reviewed and updated.  Review of Systems  Constitutional:  Negative for activity change, diaphoresis, fatigue and fever.  Respiratory:  Negative for cough, chest tightness, shortness of breath and wheezing.   Cardiovascular:  Negative for chest pain, palpitations and leg swelling.  Gastrointestinal: Negative.   Endocrine: Negative for cold intolerance, heat intolerance, polydipsia, polyphagia and polyuria.  Neurological: Negative.   Psychiatric/Behavioral: Negative.     Per HPI unless specifically indicated above     Objective:    BP (!) 148/78 (BP Location: Left Arm, Patient Position: Sitting, Cuff Size: Normal)   Pulse 62   Temp 97.9 F (36.6 C) (Oral)   Ht 5' 5.98" (1.676 m)   Wt 184 lb 6.4 oz (83.6 kg)   SpO2 97%   BMI 29.78 kg/m   Wt Readings from Last 3 Encounters:  09/01/22 184 lb 6.4 oz (83.6 kg)  06/02/22 189 lb 14.4 oz (86.1 kg)  01/11/22 182 lb (82.6 kg)    Physical Exam Vitals and  nursing note reviewed.  Constitutional:      General: He is awake. He is not in acute distress.    Appearance: He is well-developed and well-groomed. He is obese. He is not ill-appearing or toxic-appearing.  HENT:     Head: Normocephalic and atraumatic.     Right Ear: Hearing normal. No drainage.     Left Ear: Hearing normal. No drainage.  Eyes:     General: Lids are normal.        Right eye: No discharge.        Left eye:  No discharge.     Conjunctiva/sclera: Conjunctivae normal.     Pupils: Pupils are equal, round, and reactive to light.  Neck:     Thyroid: No thyromegaly.     Vascular: No carotid bruit.  Cardiovascular:     Rate and Rhythm: Normal rate and regular rhythm.     Heart sounds: Normal heart sounds, S1 normal and S2 normal. No murmur heard.    No gallop.  Pulmonary:     Effort: Pulmonary effort is normal. No accessory muscle usage or respiratory distress.     Breath sounds: Normal breath sounds.  Abdominal:     General: Bowel sounds are normal.     Palpations: Abdomen is soft.  Musculoskeletal:        General: Normal range of motion.     Cervical back: Normal range of motion and neck supple.     Right lower leg: No edema.     Left lower leg: No edema.  Skin:    General: Skin is warm and dry.  Neurological:     Mental Status: He is alert and oriented to person, place, and time.     Deep Tendon Reflexes: Reflexes are normal and symmetric.  Psychiatric:        Attention and Perception: Attention normal.        Mood and Affect: Mood normal.        Speech: Speech normal.        Behavior: Behavior normal. Behavior is cooperative.        Thought Content: Thought content normal.    Results for orders placed or performed in visit on 07/11/22  PSA  Result Value Ref Range   Prostate Specific Ag, Serum 1.9 0.0 - 4.0 ng/mL      Assessment & Plan:   Problem List Items Addressed This Visit       Cardiovascular and Mediastinum   Hypertension associated with diabetes (HCC)    Chronic, ongoing with BP elevated today on recheck and initial.  Urine ALB 20 June 2022.  Continue current medication regimen at this time, Benazepril offering kidney protection.  Add on Hydrochlorothiazide 12.5 MG daily to regimen, he has taken before and tolerated.  Can not take Amlodipine, although this would offer benefit.  Recommend checking BP a few mornings a week at home and documenting + focus on DASH diet.   Monitor for worsening edema and may provide PRN Lasix if presents, suspect some underlyin PVD based on exam.  Recommend avoid Ibuprofen products and high salt intake.  Return in 4 weeks.  Labs today: CMP.      Relevant Medications   hydrochlorothiazide (HYDRODIURIL) 12.5 MG tablet   Other Relevant Orders   Bayer DCA Hb A1c Waived   Comprehensive metabolic panel   PVD (peripheral vascular disease) (HCC)    Suspect some underlying PVD based on exam with diminished lower extremity hair pattern, edema at ankles, and  dry skin.  At this time monitor for wounds and continue frequent foot exams.  To notify provider if any wounds present or pain.      Relevant Medications   hydrochlorothiazide (HYDRODIURIL) 12.5 MG tablet     Endocrine   Hyperlipidemia associated with type 2 diabetes mellitus (HCC)    Chronic, ongoing.  Myalgia presenting with daily Atorvastatin in past.  Continue Rosuvastatin 10 MG three days a week, educated him on this.  If myalgia with 3 days a week dosing then will trial one day a week, if ongoing myalgia then stop and consider Zetia or injectable.      Relevant Medications   hydrochlorothiazide (HYDRODIURIL) 12.5 MG tablet   Other Relevant Orders   Bayer DCA Hb A1c Waived   Type 2 diabetes mellitus with diabetic neuropathy (HCC) - Primary    Chronic, ongoing with A1c trending down to 7%, urine ALB 30 in April 2024.  Continue Metformin TID and recommend heavy diet focus -- may need to restart Jardiance if elevations next visit.  Recommend he check BS at least three mornings a week and document.  If elevation above goal A1c would consider increasing Metformin to max or adding SGLT.  Continue PT for neuropathy as offering benefit and new referral to neurology placed.  Return in 3 months for A1c check due to trend up.      Relevant Orders   Bayer DCA Hb A1c Waived   Ambulatory referral to Neurology     Other   History of prostate cancer    Recent visit with Dr. Apolinar Junes and  is cancer free.  Continue collaboration with urology and PSA with them.        Follow up plan: Return in about 4 weeks (around 09/29/2022) for HTN -- added on HCTZ.

## 2022-09-01 NOTE — Assessment & Plan Note (Signed)
Chronic, ongoing.  Myalgia presenting with daily Atorvastatin in past.  Continue Rosuvastatin 10 MG three days a week, educated him on this.  If myalgia with 3 days a week dosing then will trial one day a week, if ongoing myalgia then stop and consider Zetia or injectable. 

## 2022-09-01 NOTE — Assessment & Plan Note (Signed)
Recent visit with Dr. Brandon and is cancer free.  Continue collaboration with urology and PSA with them. 

## 2022-09-01 NOTE — Assessment & Plan Note (Signed)
Chronic, ongoing with A1c trending down to 7%, urine ALB 30 in April 2024.  Continue Metformin TID and recommend heavy diet focus -- may need to restart Jardiance if elevations next visit.  Recommend he check BS at least three mornings a week and document.  If elevation above goal A1c would consider increasing Metformin to max or adding SGLT.  Continue PT for neuropathy as offering benefit and new referral to neurology placed.  Return in 3 months for A1c check due to trend up.

## 2022-09-02 LAB — COMPREHENSIVE METABOLIC PANEL
ALT: 17 IU/L (ref 0–44)
AST: 19 IU/L (ref 0–40)
Albumin: 4.4 g/dL (ref 3.8–4.8)
Alkaline Phosphatase: 63 IU/L (ref 44–121)
BUN/Creatinine Ratio: 17 (ref 10–24)
BUN: 13 mg/dL (ref 8–27)
Bilirubin Total: 0.4 mg/dL (ref 0.0–1.2)
CO2: 23 mmol/L (ref 20–29)
Calcium: 8.8 mg/dL (ref 8.6–10.2)
Chloride: 106 mmol/L (ref 96–106)
Creatinine, Ser: 0.75 mg/dL — ABNORMAL LOW (ref 0.76–1.27)
Globulin, Total: 1.8 g/dL (ref 1.5–4.5)
Glucose: 141 mg/dL — ABNORMAL HIGH (ref 70–99)
Potassium: 4.4 mmol/L (ref 3.5–5.2)
Sodium: 141 mmol/L (ref 134–144)
Total Protein: 6.2 g/dL (ref 6.0–8.5)
eGFR: 94 mL/min/{1.73_m2} (ref >59)

## 2022-09-02 NOTE — Progress Notes (Signed)
Contacted via MyChart   Good morning Joe, your labs have returned and overall these continue to look good.  No changes needed.  Great job!! Keep being amazing!!  Thank you for allowing me to participate in your care.  I appreciate you. Kindest regards, Perrin Eddleman

## 2022-09-04 ENCOUNTER — Ambulatory Visit: Payer: Medicare Other

## 2022-09-04 DIAGNOSIS — M6281 Muscle weakness (generalized): Secondary | ICD-10-CM | POA: Diagnosis not present

## 2022-09-04 DIAGNOSIS — R2681 Unsteadiness on feet: Secondary | ICD-10-CM | POA: Diagnosis not present

## 2022-09-04 DIAGNOSIS — Z9181 History of falling: Secondary | ICD-10-CM

## 2022-09-04 DIAGNOSIS — R262 Difficulty in walking, not elsewhere classified: Secondary | ICD-10-CM | POA: Diagnosis not present

## 2022-09-04 NOTE — Therapy (Signed)
OUTPATIENT PHYSICAL THERAPY TREATMENT NOTE    Patient Name: Tyler Morgan MRN: [id number] DOB:03-02-1945, 77 y.o., male Today's Date: 09/04/2022  PCP: [name], NP  REFERRING PROVIDER: [name], NP   END OF SESSION:  PT End of Session - 09/04/22 1521     Visit Number 12    Number of Visits 17    Date for PT Re-Evaluation 09/21/22    Progress Note Due on Visit 10    PT Start Time 1521    PT Stop Time 1602    PT Time Calculation (min) 41 min    Activity Tolerance Patient tolerated treatment well    Behavior During Therapy WFL for tasks assessed/performed                       Past Medical History:  Diagnosis Date   Diabetes mellitus without complication (HCC)    ED (erectile dysfunction)    Hypertension    Motion sickness    boats   Neuropathy    Prostate enlargement    Urinary retention    Past Surgical History:  Procedure Laterality Date   BASAL CELL CARCINOMA EXCISION     nose    CATARACT EXTRACTION Bilateral 1986, 2014   COLONOSCOPY WITH PROPOFOL N/A 03/27/2018   Procedure: COLONOSCOPY WITH BIOPSIES;  Surgeon: Toney Reil, MD;  Location: [facility];  Service: Endoscopy;  Laterality: N/A;  Diabetic - oral meds   EYE SURGERY Bilateral [phone]   FRACTURE SURGERY Right    as a child/ right arm   HERNIA REPAIR Right 01-13-14   inguinal hernia   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 N/A 02/07/2017   Procedure: 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;  Surgeon: Vanna Scotland, MD;  Location: [facility];  Service: Urology;  Laterality: N/A;   POLYPECTOMY N/A 03/27/2018   Procedure: POLYPECTOMY;  Surgeon: Toney Reil, MD;  Location: [facility];  Service: Endoscopy;  Laterality: N/A;   RETINAL DETACHMENT SURGERY  Jan 2015   TONSILLECTOMY  1955   Patient Active Problem List   Diagnosis Date Noted   PVD (peripheral vascular disease) (HCC) 11/30/2021   B12  deficiency 01/29/2019   History of prostate cancer 12/13/2017   Hemorrhoids 12/13/2017   Hyperlipidemia associated with type 2 diabetes mellitus (HCC) 04/02/2017   Advanced care planning/counseling discussion 10/11/2016   Type 2 diabetes mellitus with diabetic neuropathy (HCC) 12/15/2014   Hypertension associated with diabetes (HCC)     REFERRING DIAG: E11.40 (ICD-10-CM) - Type 2 diabetes mellitus with diabetic neuropathy, without long-term current use of insulin (HCC)   THERAPY DIAG:  Unsteadiness on feet  Difficulty in walking, not elsewhere classified  Muscle weakness (generalized)  History of falling  Rationale for Evaluation and Treatment Rehabilitation  PERTINENT HISTORY: Balance, weakness. Can get around well without a cane. Standing still, pt loses balance. Has to hold onto things to stabilize himself. Has labradore thats about 77 year old and can walk his dog without a leash because the dog's pulling stabilizes him. Symptoms have been going on for a while and was told to have diabetic neuropathy. Had a fall in 2015 while working on the flooring in his den at home which caused R inguinal hernia S/P mesh placement. Pt states noticing problems in his legs since 2015. Pt also could not push up on his toes (tip toe) since then. No back pain currently but has had a hx of back pain over  the years. Decreased sensation B feet, almost not noticeable. Feels like he has a lot of weakness which makes it difficult to keep his balance. Has B LE swelling, comes and goes, not currently getting treated for it. No low back pain, just L arm and shoulder.   PRECAUTIONS: Fall risk; L5 anterolisthesis   SUBJECTIVE:   SUBJECTIVE STATEMENT: Walking seems to be ok today. Neck is better, still a little stiff but not bad.     PAIN:  Are you having pain? See subjective   TODAY'S TREATMENT:                                                                                                                                          DATE: 09/04/2022      Per MRI on 12/26/2016: 1. 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 There is probable chronic bilateral L5 nerve root encroachment.      No latex allergies.      Therapeutic exercise   Sit <> stand from chair without  Air Ex pad and without UE assist, CGA to min A               5x3  Seated B scapular retraction 10x10 seconds to promote thoracic extension for 2 sets  NuStep seat 9, level 1 for 5 minutes   Cues for pressing down with forefeet for ankle PF muscle use.   B gastroc/soleus muscle contraction observed.    Standing posterior pelvic tilt 10x10 seconds for 2 sets   B gastroc tightness felt afterwards.   Seated trunk flexion stretch 10x10 seconds   Decreased B gastroc tightness afterwards  Seated manually resisted ankle PF (concentric, isometric (about 5 second holds) and eccentric contraction)   R 5x3   Gastroc and solues muscle palpated.   Pt able to provide about 4-/5  resistance    L 5x3   Soleus contraction palpated   Pt able to provide about 3+/5 resistance   Improved exercise technique, movement at target joints, use of target muscles after mod verbal, visual, tactile cues.           Response to treatment Pt tolerated session well without aggravation of symptoms.       Clinical impression  Slight improved R and L PF flexion strength by 1/2 MMT grade since initial evaluation. Continued working on thoracic extension to decrease stress to neck (for improve balance while walking and turning his head) and low back. Continued working on trunk, quadriceps, and ankle strengthening to promote ability to ambulate with less difficulty. Pt tolerated session well without aggravation of symptoms.  Pt will benefit from continued skilled physical therapy services to improve strength, balance, function, and decrease fall risk.          PATIENT EDUCATION: Education details: there-ex, HEP Person educated: Patient Education method: Explanation,  Demonstration, Tactile cues, Verbal cues, and Handouts Education comprehension: verbalized understanding and returned demonstration  HOME EXERCISE PROGRAM: Access Code: [email or web address] URL: [email or web address] Date: 07/24/2022 Prepared by: Loralyn Freshwater   Exercises - Seated Transversus Abdominis Bracing  - 5 x daily - 7 x weekly - 3 sets - 10 reps - 5 seconds hold  - Supine Posterior Pelvic Tilt  - 1 x daily - 7 x weekly - 3 sets - 10 reps - 5 seconds hold - Long Sitting Ankle Plantar Flexion with Resistance  - 1 x daily - 7 x weekly - 3 sets - 10 reps  - Long Sitting Ankle Dorsiflexion AROM  - 1 x daily - 7 x weekly - 3 sets - 10 reps - 5 seconds hold  - Supine March with Posterior Pelvic Tilt  - 1 x daily - 7 x weekly - 3 sets - 10 reps - Curl Up with Arms Crossed  - 1 x daily - 7 x weekly - 3 sets - 10 reps - Supine Double Knee to Chest  - 1 x daily - 7 x weekly - 3 sets - 10 reps - 5 seconds hold - Bent Knee Fallouts with Alternating Legs  - 1 x daily - 7 x weekly - 3 sets - 10 reps - Standing Posterior Pelvic Tilt  - 3 x daily - 7 x weekly - 3 sets - 10 reps - 5 seconds hold  - Mini Squat with Counter Support  - 1 x daily - 7 x weekly - 3 sets - 10 reps  - Standing Hip Abduction with Resistance at Ankles and Counter Support  - 1 x daily - 7 x weekly - 1-3 sets - 10 reps - 5 seconds hold   Recommended for pt to perform scapular retractions and chin tucks at home (sitting) to help continue to decrease neck stiffness. Pt demonstrated and verbalized understanding.    PT Short Term Goals - 08/28/22 1617       PT SHORT TERM GOAL #1   Title Pt will be independent with his initial HEP to improve strength, balance, and function.    Baseline Pt has started his HEP. (07/24/2022); pt demonstrates independence with his inital HEP (08/28/2022)    Time 3     Period Weeks    Status Achieved    Target Date 08/18/22              PT Long Term Goals - 08/28/22 1529       PT LONG TERM GOAL #1   Title Pt will improve B ankle PF, DF, EV, and IV strength by at least 1/2 MMT grade to promote ability to ambulate, as well as perform standing tasks with less difficulty and decreased fall risk.    Baseline Manually resisted ankle: PF 3+/5 R, and L;  DF 3+/5 R, 3/5 L; EV 4-/5 R, 3+/5 L; IV 3+/5 R and L (07/24/2022); Manually resisted ankle PF 4-/5 R, 3+/5 L,  DF 3+/5 R, 3+/5 L, EV 4-/5 R, 3+/5 L, IV 3+/5 R, 3+/5 L (08/28/2022)    Time 8    Period Weeks    Status Partially Met    Target Date 09/22/22      PT LONG TERM GOAL #2   Title Pt will improve B hip extension and abduction strength to promote ability to perform standing tasks with less difficulty and decreased fall risk.    Baseline Hip extension 3+/5 R and L; hip abduction 4/5 R and L (  07/24/2022); hip extension 4/5 R and L, hip abduction 4+/5 R and L (08/28/2022)    Time 8    Period Weeks    Status Achieved    Target Date 09/22/22      PT LONG TERM GOAL #3   Title Pt will improve his FOTO score by at least 10 points as a demonsration of improved function.    Baseline Foot FOTO 67 (07/24/2022)    Time 8    Period Weeks    Status On-going    Target Date 09/22/22      PT LONG TERM GOAL #4   Title Pt will improve his DGI score to at least 19 points as a demonstration of improved balance and decreased fall risk.    Baseline DGI 13 (07/24/2022); 16 (08/28/2022)    Time 8    Period Weeks    Status Partially Met    Target Date 09/22/22              Plan - 09/04/22 1521     Clinical Impression Statement Slight improved R and L PF flexion strength by 1/2 MMT grade since initial evaluation. Continued working on thoracic extension to decrease stress to neck (for improve balance while walking and turning his head) and low back. Continued working on trunk, quadriceps, and ankle strengthening to promote  ability to ambulate with less difficulty. Pt tolerated session well without aggravation of symptoms.  Pt will benefit from continued skilled physical therapy services to improve strength, balance, function, and decrease fall risk.    Personal Factors and Comorbidities Comorbidity 3+;Age;Past/Current Experience;Time since onset of injury/illness/exacerbation;Profession;Fitness    Comorbidities HTN, DM, neuropathy    Examination-Activity Limitations Stand;Lift;Carry;Bend;Locomotion Level;Dressing;Squat;Transfers;Stairs    Stability/Clinical Decision Making Stable/Uncomplicated   Balance seem to be worsening based on subjective reports.   Rehab Potential Fair    PT Frequency 2x / week    PT Duration 8 weeks    PT Treatment/Interventions Therapeutic exercise;Balance training;Functional mobility training;Stair training;Therapeutic activities;Neuromuscular re-education;Patient/family education;Manual techniques;Electrical Stimulation;Gait training    PT Next Visit Plan lumbopelvic strengthening, stability and control, B hip, thigh, and ankle strengthening, balance, gait, manual techniques, modalities PRN.    PT Home Exercise Plan Medbridge Access Code: [email or web address]    Consulted and Agree with Plan of Care Patient               Loralyn Freshwater PT, DPT  09/04/2022, 4:16 PM

## 2022-09-06 ENCOUNTER — Ambulatory Visit: Payer: Medicare Other | Admitting: Dermatology

## 2022-09-06 ENCOUNTER — Ambulatory Visit: Payer: Medicare Other

## 2022-09-06 ENCOUNTER — Encounter: Payer: Self-pay | Admitting: Nurse Practitioner

## 2022-09-06 DIAGNOSIS — Z9181 History of falling: Secondary | ICD-10-CM

## 2022-09-06 DIAGNOSIS — R2681 Unsteadiness on feet: Secondary | ICD-10-CM

## 2022-09-06 DIAGNOSIS — R262 Difficulty in walking, not elsewhere classified: Secondary | ICD-10-CM

## 2022-09-06 DIAGNOSIS — M6281 Muscle weakness (generalized): Secondary | ICD-10-CM | POA: Diagnosis not present

## 2022-09-06 NOTE — Therapy (Signed)
OUTPATIENT PHYSICAL THERAPY TREATMENT NOTE    Patient Name: Tyler Morgan MRN: [id number] DOB:1945-09-24, 77 y.o., male Today's Date: 09/06/2022  PCP: [name], NP  REFERRING PROVIDER: [name], NP   END OF SESSION:  PT End of Session - 09/06/22 1524     Visit Number 13    Number of Visits 17    Date for PT Re-Evaluation 09/21/22    Progress Note Due on Visit 10    PT Start Time 1524    PT Stop Time 1611    PT Time Calculation (min) 47 min    Activity Tolerance Patient tolerated treatment well    Behavior During Therapy WFL for tasks assessed/performed                        Past Medical History:  Diagnosis Date   Diabetes mellitus without complication (HCC)    ED (erectile dysfunction)    Hypertension    Motion sickness    boats   Neuropathy    Prostate enlargement    Urinary retention    Past Surgical History:  Procedure Laterality Date   BASAL CELL CARCINOMA EXCISION     nose    CATARACT EXTRACTION Bilateral 1986, 2014   COLONOSCOPY WITH PROPOFOL N/A 03/27/2018   Procedure: COLONOSCOPY WITH BIOPSIES;  Surgeon: Toney Reil, MD;  Location: [facility];  Service: Endoscopy;  Laterality: N/A;  Diabetic - oral meds   EYE SURGERY Bilateral [phone]   FRACTURE SURGERY Right    as a child/ right arm   HERNIA REPAIR Right 01-13-14   inguinal hernia   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 N/A 02/07/2017   Procedure: 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;  Surgeon: Vanna Scotland, MD;  Location: [facility];  Service: Urology;  Laterality: N/A;   POLYPECTOMY N/A 03/27/2018   Procedure: POLYPECTOMY;  Surgeon: Toney Reil, MD;  Location: [facility];  Service: Endoscopy;  Laterality: N/A;   RETINAL DETACHMENT SURGERY  Jan 2015   TONSILLECTOMY  1955   Patient Active Problem List   Diagnosis Date Noted   PVD (peripheral vascular disease) (HCC) 11/30/2021   B12  deficiency 01/29/2019   History of prostate cancer 12/13/2017   Hemorrhoids 12/13/2017   Hyperlipidemia associated with type 2 diabetes mellitus (HCC) 04/02/2017   Advanced care planning/counseling discussion 10/11/2016   Type 2 diabetes mellitus with diabetic neuropathy (HCC) 12/15/2014   Hypertension associated with diabetes (HCC)     REFERRING DIAG: E11.40 (ICD-10-CM) - Type 2 diabetes mellitus with diabetic neuropathy, without long-term current use of insulin (HCC)   THERAPY DIAG:  Unsteadiness on feet  Difficulty in walking, not elsewhere classified  Muscle weakness (generalized)  History of falling  Rationale for Evaluation and Treatment Rehabilitation  PERTINENT HISTORY: Balance, weakness. Can get around well without a cane. Standing still, pt loses balance. Has to hold onto things to stabilize himself. Has labradore thats about 77 year old and can walk his dog without a leash because the dog's pulling stabilizes him. Symptoms have been going on for a while and was told to have diabetic neuropathy. Had a fall in 2015 while working on the flooring in his den at home which caused R inguinal hernia S/P mesh placement. Pt states noticing problems in his legs since 2015. Pt also could not push up on his toes (tip toe) since then. No back pain currently but has had a hx of back pain  over the years. Decreased sensation B feet, almost not noticeable. Feels like he has a lot of weakness which makes it difficult to keep his balance. Has B LE swelling, comes and goes, not currently getting treated for it. No low back pain, just L arm and shoulder.   PRECAUTIONS: Fall risk; L5 anterolisthesis   SUBJECTIVE:   SUBJECTIVE STATEMENT: Walking is doing ok. No pain currently.       PAIN:  Are you having pain? See subjective   TODAY'S TREATMENT:                                                                                                                                         DATE:  09/06/2022      Per MRI on 12/26/2016: 1. 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 There is probable chronic bilateral L5 nerve root encroachment.      No latex allergies.      Therapeutic exercise   NuStep seat 9, level 2 for 5 minutes   Cues for pressing down with forefeet for ankle PF muscle use.   B gastroc/soleus muscle contraction observed R > L  Seated manually resisted ankle PF (concentric, isometric (about 5 second holds) and eccentric contraction)   L 5x3   Soleus contraction palpated   Pt able to provide about 3+/5 resistance   R 5x3   Gastroc and solues muscle palpated.   Pt able to provide about 4-/5  resistance     Seated PT assisted ankle EV to end range with eccentric manual resistance  L 5x3  R 5x3  Seated ankle circles clockwise and counter clockwise   L 10x3 each way  R 10x3 each way  Standing with B UE assist   On Air Ex pad   B mini squat 10x3   Pt education on lumbar sacral innervation to B gastroc muscles. Pt was recommended to contact his referring provided as well as Dr Sherryll Burger pertaining to his lumbar MRI on 2018. Pt verbalized understanding.     Improved exercise technique, movement at target joints, use of target muscles after mod verbal, visual, tactile cues.           Response to treatment Pt tolerated session well without aggravation of symptoms.       Clinical impression  Continued working on improving B gastroc soleus as well as peroneal muscle strengthening to improve stability when standing and ambulating. Pt demonstrates greater R ankle muscle contraction compared to his L. Pt tolerated session well without aggravation of symptoms. Pt will benefit from skilled physical therapy services to improve strength, balance, and function.         PATIENT EDUCATION: Education details: there-ex, HEP Person educated: Patient Education  method: Explanation, Demonstration, Tactile cues, Verbal cues, and Handouts Education comprehension: verbalized understanding and returned demonstration  HOME EXERCISE  PROGRAM: Access Code: [email or web address] URL: [email or web address] Date: 07/24/2022 Prepared by: Loralyn Freshwater   Exercises - Seated Transversus Abdominis Bracing  - 5 x daily - 7 x weekly - 3 sets - 10 reps - 5 seconds hold  - Supine Posterior Pelvic Tilt  - 1 x daily - 7 x weekly - 3 sets - 10 reps - 5 seconds hold - Long Sitting Ankle Plantar Flexion with Resistance  - 1 x daily - 7 x weekly - 3 sets - 10 reps  - Long Sitting Ankle Dorsiflexion AROM  - 1 x daily - 7 x weekly - 3 sets - 10 reps - 5 seconds hold  - Supine March with Posterior Pelvic Tilt  - 1 x daily - 7 x weekly - 3 sets - 10 reps - Curl Up with Arms Crossed  - 1 x daily - 7 x weekly - 3 sets - 10 reps - Supine Double Knee to Chest  - 1 x daily - 7 x weekly - 3 sets - 10 reps - 5 seconds hold - Bent Knee Fallouts with Alternating Legs  - 1 x daily - 7 x weekly - 3 sets - 10 reps - Standing Posterior Pelvic Tilt  - 3 x daily - 7 x weekly - 3 sets - 10 reps - 5 seconds hold  - Mini Squat with Counter Support  - 1 x daily - 7 x weekly - 3 sets - 10 reps  - Standing Hip Abduction with Resistance at Ankles and Counter Support  - 1 x daily - 7 x weekly - 1-3 sets - 10 reps - 5 seconds hold   Recommended for pt to perform scapular retractions and chin tucks at home (sitting) to help continue to decrease neck stiffness. Pt demonstrated and verbalized understanding.    PT Short Term Goals - 08/28/22 1617       PT SHORT TERM GOAL #1   Title Pt will be independent with his initial HEP to improve strength, balance, and function.    Baseline Pt has started his HEP. (07/24/2022); pt demonstrates independence with his inital HEP (08/28/2022)    Time 3    Period Weeks    Status Achieved    Target Date 08/18/22              PT Long Term Goals - 08/28/22  1529       PT LONG TERM GOAL #1   Title Pt will improve B ankle PF, DF, EV, and IV strength by at least 1/2 MMT grade to promote ability to ambulate, as well as perform standing tasks with less difficulty and decreased fall risk.    Baseline Manually resisted ankle: PF 3+/5 R, and L;  DF 3+/5 R, 3/5 L; EV 4-/5 R, 3+/5 L; IV 3+/5 R and L (07/24/2022); Manually resisted ankle PF 4-/5 R, 3+/5 L,  DF 3+/5 R, 3+/5 L, EV 4-/5 R, 3+/5 L, IV 3+/5 R, 3+/5 L (08/28/2022)    Time 8    Period Weeks    Status Partially Met    Target Date 09/22/22      PT LONG TERM GOAL #2   Title Pt will improve B hip extension and abduction strength to promote ability to perform standing tasks with less difficulty and decreased fall risk.    Baseline Hip extension 3+/5 R and L; hip abduction 4/5 R and L (07/24/2022); hip extension 4/5 R and L, hip abduction 4+/5 R and L (08/28/2022)  Time 8    Period Weeks    Status Achieved    Target Date 09/22/22      PT LONG TERM GOAL #3   Title Pt will improve his FOTO score by at least 10 points as a demonsration of improved function.    Baseline Foot FOTO 67 (07/24/2022)    Time 8    Period Weeks    Status On-going    Target Date 09/22/22      PT LONG TERM GOAL #4   Title Pt will improve his DGI score to at least 19 points as a demonstration of improved balance and decreased fall risk.    Baseline DGI 13 (07/24/2022); 16 (08/28/2022)    Time 8    Period Weeks    Status Partially Met    Target Date 09/22/22              Plan - 09/06/22 1616     Clinical Impression Statement Continued working on improving B gastroc soleus as well as peroneal muscle strengthening to improve stability when standing and ambulating. Pt demonstrates greater R ankle muscle contraction compared to his L. Pt tolerated session well without aggravation of symptoms. Pt will benefit from skilled physical therapy services to improve strength, balance, and function.    Personal Factors and Comorbidities  Comorbidity 3+;Age;Past/Current Experience;Time since onset of injury/illness/exacerbation;Profession;Fitness    Comorbidities HTN, DM, neuropathy    Examination-Activity Limitations Stand;Lift;Carry;Bend;Locomotion Level;Dressing;Squat;Transfers;Stairs    Stability/Clinical Decision Making Stable/Uncomplicated   Balance seem to be worsening based on subjective reports.   Clinical Decision Making Low    Rehab Potential Fair    PT Frequency 2x / week    PT Duration 8 weeks    PT Treatment/Interventions Therapeutic exercise;Balance training;Functional mobility training;Stair training;Therapeutic activities;Neuromuscular re-education;Patient/family education;Manual techniques;Electrical Stimulation;Gait training    PT Next Visit Plan lumbopelvic strengthening, stability and control, B hip, thigh, and ankle strengthening, balance, gait, manual techniques, modalities PRN.    PT Home Exercise Plan Medbridge Access Code: [email or web address]    Consulted and Agree with Plan of Care Patient                Loralyn Freshwater PT, DPT  09/06/2022, 4:17 PM

## 2022-09-07 DIAGNOSIS — E119 Type 2 diabetes mellitus without complications: Secondary | ICD-10-CM | POA: Diagnosis not present

## 2022-09-07 DIAGNOSIS — Z961 Presence of intraocular lens: Secondary | ICD-10-CM | POA: Diagnosis not present

## 2022-09-07 DIAGNOSIS — H33001 Unspecified retinal detachment with retinal break, right eye: Secondary | ICD-10-CM | POA: Diagnosis not present

## 2022-09-07 LAB — HM DIABETES EYE EXAM

## 2022-09-11 ENCOUNTER — Ambulatory Visit: Payer: Medicare Other

## 2022-09-11 ENCOUNTER — Encounter: Payer: Self-pay | Admitting: Nurse Practitioner

## 2022-09-11 DIAGNOSIS — R262 Difficulty in walking, not elsewhere classified: Secondary | ICD-10-CM | POA: Diagnosis not present

## 2022-09-11 DIAGNOSIS — Z9181 History of falling: Secondary | ICD-10-CM | POA: Diagnosis not present

## 2022-09-11 DIAGNOSIS — M6281 Muscle weakness (generalized): Secondary | ICD-10-CM | POA: Diagnosis not present

## 2022-09-11 DIAGNOSIS — R2681 Unsteadiness on feet: Secondary | ICD-10-CM

## 2022-09-11 NOTE — Therapy (Signed)
OUTPATIENT PHYSICAL THERAPY TREATMENT NOTE    Patient Name: Tyler Morgan MRN: [id number] DOB:23-Nov-1945, 77 y.o., male Today's Date: 09/11/2022  PCP: [name], NP  REFERRING PROVIDER: [name], NP   END OF SESSION:  PT End of Session - 09/11/22 1515     Visit Number 14    Number of Visits 17    Date for PT Re-Evaluation 09/21/22    Progress Note Due on Visit 10    PT Start Time 1515    PT Stop Time 1557    PT Time Calculation (min) 42 min    Activity Tolerance Patient tolerated treatment well    Behavior During Therapy WFL for tasks assessed/performed                         Past Medical History:  Diagnosis Date   Diabetes mellitus without complication (HCC)    ED (erectile dysfunction)    Hypertension    Motion sickness    boats   Neuropathy    Prostate enlargement    Urinary retention    Past Surgical History:  Procedure Laterality Date   BASAL CELL CARCINOMA EXCISION     nose    CATARACT EXTRACTION Bilateral 1986, 2014   COLONOSCOPY WITH PROPOFOL N/A 03/27/2018   Procedure: COLONOSCOPY WITH BIOPSIES;  Surgeon: Toney Reil, MD;  Location: [facility];  Service: Endoscopy;  Laterality: N/A;  Diabetic - oral meds   EYE SURGERY Bilateral [phone]   FRACTURE SURGERY Right    as a child/ right arm   HERNIA REPAIR Right 01-13-14   inguinal hernia   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 N/A 02/07/2017   Procedure: 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;  Surgeon: Vanna Scotland, MD;  Location: [facility];  Service: Urology;  Laterality: N/A;   POLYPECTOMY N/A 03/27/2018   Procedure: POLYPECTOMY;  Surgeon: Toney Reil, MD;  Location: [facility];  Service: Endoscopy;  Laterality: N/A;   RETINAL DETACHMENT SURGERY  Jan 2015   TONSILLECTOMY  1955   Patient Active Problem List   Diagnosis Date Noted   PVD (peripheral vascular disease) (HCC) 11/30/2021   B12  deficiency 01/29/2019   History of prostate cancer 12/13/2017   Hemorrhoids 12/13/2017   Hyperlipidemia associated with type 2 diabetes mellitus (HCC) 04/02/2017   Advanced care planning/counseling discussion 10/11/2016   Type 2 diabetes mellitus with diabetic neuropathy (HCC) 12/15/2014   Hypertension associated with diabetes (HCC)     REFERRING DIAG: E11.40 (ICD-10-CM) - Type 2 diabetes mellitus with diabetic neuropathy, without long-term current use of insulin (HCC)   THERAPY DIAG:  Unsteadiness on feet  Difficulty in walking, not elsewhere classified  Muscle weakness (generalized)  History of falling  Rationale for Evaluation and Treatment Rehabilitation  PERTINENT HISTORY: Balance, weakness. Can get around well without a cane. Standing still, pt loses balance. Has to hold onto things to stabilize himself. Has labradore thats about 77 year old and can walk his dog without a leash because the dog's pulling stabilizes him. Symptoms have been going on for a while and was told to have diabetic neuropathy. Had a fall in 2015 while working on the flooring in his den at home which caused R inguinal hernia S/P mesh placement. Pt states noticing problems in his legs since 2015. Pt also could not push up on his toes (tip toe) since then. No back pain currently but has had a hx of back  pain over the years. Decreased sensation B feet, almost not noticeable. Feels like he has a lot of weakness which makes it difficult to keep his balance. Has B LE swelling, comes and goes, not currently getting treated for it. No low back pain, just L arm and shoulder.   PRECAUTIONS: Fall risk; L5 anterolisthesis   SUBJECTIVE:   SUBJECTIVE STATEMENT: Walking has been pretty good. Balance does not seem to be right for some reason. Feels dizzy, the room does not look of feel like it is spinning. Turned to the R going into the waiting room earlier, almost lost his balance.       PAIN:  Are you having pain? See  subjective   TODAY'S TREATMENT:                                                                                                                                         DATE: 09/11/2022      Per MRI on 12/26/2016: 1. 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 There is probable chronic bilateral L5 nerve root encroachment.      No latex allergies.      Manual therapy  Seated STM B cervical paraspinal and upper trap muscles to decrease tension and improve ability to ambulate while turning his head more steadily.    Therapeutic exercise   Blood pressure L arm sitting, mechanically taken, normal cuff: 117/67, HR 67  Seated B scapular retraction with chin tuck 10x3 with 5 second holds to decrease Upper trap muscle tension to neck    Seated manually resisted ankle PF (concentric, isometric (about 5 second holds) and eccentric contraction)   L 5x3   Soleus contraction palpated      R 5x3   Gastroc and solues muscle palpated.    Seated PT assisted ankle EV to end range with eccentric manual resistance  L 5x3  R 5x3  Seated ankle circles clockwise and counter clockwise   L 10x3 each way   With PT assist to end range for L   R 10x3 each way      Improved exercise technique, movement at target joints, use of target muscles after mod verbal, visual, tactile cues.           Response to treatment Pt tolerated session well without aggravation of symptoms.       Clinical impression  Worked on decreasing muscle tension to neck to decrease cervicogenic unsteadiness. Continued working on improving R and L ankle strength to decrease difficulty ambulating. Improving R gastroc strength palpated. Improved steadiness with gait reported after session. Pt tolerated session well without aggravation of symptoms. Pt will benefit from skilled physical therapy services to improve strength,  balance, and function.         PATIENT EDUCATION: Education details: there-ex, HEP Person educated: Patient Education method: Programmer, multimedia, Demonstration,  Tactile cues, Verbal cues, and Handouts Education comprehension: verbalized understanding and returned demonstration  HOME EXERCISE PROGRAM: Access Code: [email or web address] URL: [email or web address] Date: 07/24/2022 Prepared by: Loralyn Freshwater   Exercises - Seated Transversus Abdominis Bracing  - 5 x daily - 7 x weekly - 3 sets - 10 reps - 5 seconds hold  - Supine Posterior Pelvic Tilt  - 1 x daily - 7 x weekly - 3 sets - 10 reps - 5 seconds hold - Long Sitting Ankle Plantar Flexion with Resistance  - 1 x daily - 7 x weekly - 3 sets - 10 reps  - Long Sitting Ankle Dorsiflexion AROM  - 1 x daily - 7 x weekly - 3 sets - 10 reps - 5 seconds hold  - Supine March with Posterior Pelvic Tilt  - 1 x daily - 7 x weekly - 3 sets - 10 reps - Curl Up with Arms Crossed  - 1 x daily - 7 x weekly - 3 sets - 10 reps - Supine Double Knee to Chest  - 1 x daily - 7 x weekly - 3 sets - 10 reps - 5 seconds hold - Bent Knee Fallouts with Alternating Legs  - 1 x daily - 7 x weekly - 3 sets - 10 reps - Standing Posterior Pelvic Tilt  - 3 x daily - 7 x weekly - 3 sets - 10 reps - 5 seconds hold  - Mini Squat with Counter Support  - 1 x daily - 7 x weekly - 3 sets - 10 reps  - Standing Hip Abduction with Resistance at Ankles and Counter Support  - 1 x daily - 7 x weekly - 1-3 sets - 10 reps - 5 seconds hold   Recommended for pt to perform scapular retractions and chin tucks at home (sitting) to help continue to decrease neck stiffness. Pt demonstrated and verbalized understanding.    PT Short Term Goals - 08/28/22 1617       PT SHORT TERM GOAL #1   Title Pt will be independent with his initial HEP to improve strength, balance, and function.    Baseline Pt has started his HEP. (07/24/2022); pt demonstrates independence with his inital HEP  (08/28/2022)    Time 3    Period Weeks    Status Achieved    Target Date 08/18/22              PT Long Term Goals - 08/28/22 1529       PT LONG TERM GOAL #1   Title Pt will improve B ankle PF, DF, EV, and IV strength by at least 1/2 MMT grade to promote ability to ambulate, as well as perform standing tasks with less difficulty and decreased fall risk.    Baseline Manually resisted ankle: PF 3+/5 R, and L;  DF 3+/5 R, 3/5 L; EV 4-/5 R, 3+/5 L; IV 3+/5 R and L (07/24/2022); Manually resisted ankle PF 4-/5 R, 3+/5 L,  DF 3+/5 R, 3+/5 L, EV 4-/5 R, 3+/5 L, IV 3+/5 R, 3+/5 L (08/28/2022)    Time 8    Period Weeks    Status Partially Met    Target Date 09/22/22      PT LONG TERM GOAL #2   Title Pt will improve B hip extension and abduction strength to promote ability to perform standing tasks with less difficulty and decreased fall risk.    Baseline Hip extension 3+/5 R and L; hip abduction 4/5 R and L (07/24/2022);  hip extension 4/5 R and L, hip abduction 4+/5 R and L (08/28/2022)    Time 8    Period Weeks    Status Achieved    Target Date 09/22/22      PT LONG TERM GOAL #3   Title Pt will improve his FOTO score by at least 10 points as a demonsration of improved function.    Baseline Foot FOTO 67 (07/24/2022)    Time 8    Period Weeks    Status On-going    Target Date 09/22/22      PT LONG TERM GOAL #4   Title Pt will improve his DGI score to at least 19 points as a demonstration of improved balance and decreased fall risk.    Baseline DGI 13 (07/24/2022); 16 (08/28/2022)    Time 8    Period Weeks    Status Partially Met    Target Date 09/22/22              Plan - 09/11/22 1514     Clinical Impression Statement Worked on decreasing muscle tension to neck to decrease cervicogenic unsteadiness. Continued working on improving R and L ankle strength to decrease difficulty ambulating. Improving R gastroc strength palpated. Improved steadiness with gait reported after session. Pt  tolerated session well without aggravation of symptoms. Pt will benefit from skilled physical therapy services to improve strength, balance, and function.    Personal Factors and Comorbidities Comorbidity 3+;Age;Past/Current Experience;Time since onset of injury/illness/exacerbation;Profession;Fitness    Comorbidities HTN, DM, neuropathy    Examination-Activity Limitations Stand;Lift;Carry;Bend;Locomotion Level;Dressing;Squat;Transfers;Stairs    Stability/Clinical Decision Making Stable/Uncomplicated   Balance seem to be worsening based on subjective reports.   Clinical Decision Making Low    Rehab Potential Fair    PT Frequency 2x / week    PT Duration 8 weeks    PT Treatment/Interventions Therapeutic exercise;Balance training;Functional mobility training;Stair training;Therapeutic activities;Neuromuscular re-education;Patient/family education;Manual techniques;Electrical Stimulation;Gait training    PT Next Visit Plan lumbopelvic strengthening, stability and control, B hip, thigh, and ankle strengthening, balance, gait, manual techniques, modalities PRN.    PT Home Exercise Plan Medbridge Access Code: [email or web address]    Consulted and Agree with Plan of Care Patient                Loralyn Freshwater PT, DPT  09/11/2022, 4:06 PM

## 2022-09-13 ENCOUNTER — Ambulatory Visit: Payer: Medicare Other

## 2022-09-13 DIAGNOSIS — R2681 Unsteadiness on feet: Secondary | ICD-10-CM | POA: Diagnosis not present

## 2022-09-13 DIAGNOSIS — Z9181 History of falling: Secondary | ICD-10-CM | POA: Diagnosis not present

## 2022-09-13 DIAGNOSIS — M6281 Muscle weakness (generalized): Secondary | ICD-10-CM | POA: Diagnosis not present

## 2022-09-13 DIAGNOSIS — R262 Difficulty in walking, not elsewhere classified: Secondary | ICD-10-CM | POA: Diagnosis not present

## 2022-09-13 NOTE — Therapy (Signed)
OUTPATIENT PHYSICAL THERAPY TREATMENT NOTE    Patient Name: Tyler Morgan MRN: [id number] DOB:08/02/1945, 77 y.o., male Today's Date: 09/13/2022  PCP: [name], NP  REFERRING PROVIDER: [name], NP   END OF SESSION:  PT End of Session - 09/13/22 1514     Visit Number 15    Number of Visits 17    Date for PT Re-Evaluation 09/21/22    Progress Note Due on Visit 10    PT Start Time 1514    PT Stop Time 1558    PT Time Calculation (min) 44 min    Activity Tolerance Patient tolerated treatment well    Behavior During Therapy WFL for tasks assessed/performed                          Past Medical History:  Diagnosis Date   Diabetes mellitus without complication (HCC)    ED (erectile dysfunction)    Hypertension    Motion sickness    boats   Neuropathy    Prostate enlargement    Urinary retention    Past Surgical History:  Procedure Laterality Date   BASAL CELL CARCINOMA EXCISION     nose    CATARACT EXTRACTION Bilateral 1986, 2014   COLONOSCOPY WITH PROPOFOL N/A 03/27/2018   Procedure: COLONOSCOPY WITH BIOPSIES;  Surgeon: Toney Reil, MD;  Location: [facility];  Service: Endoscopy;  Laterality: N/A;  Diabetic - oral meds   EYE SURGERY Bilateral [phone]   FRACTURE SURGERY Right    as a child/ right arm   HERNIA REPAIR Right 01-13-14   inguinal hernia   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 N/A 02/07/2017   Procedure: 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;  Surgeon: Vanna Scotland, MD;  Location: [facility];  Service: Urology;  Laterality: N/A;   POLYPECTOMY N/A 03/27/2018   Procedure: POLYPECTOMY;  Surgeon: Toney Reil, MD;  Location: [facility];  Service: Endoscopy;  Laterality: N/A;   RETINAL DETACHMENT SURGERY  Jan 2015   TONSILLECTOMY  1955   Patient Active Problem List   Diagnosis Date Noted   PVD (peripheral vascular disease) (HCC) 11/30/2021   B12  deficiency 01/29/2019   History of prostate cancer 12/13/2017   Hemorrhoids 12/13/2017   Hyperlipidemia associated with type 2 diabetes mellitus (HCC) 04/02/2017   Advanced care planning/counseling discussion 10/11/2016   Type 2 diabetes mellitus with diabetic neuropathy (HCC) 12/15/2014   Hypertension associated with diabetes (HCC)     REFERRING DIAG: E11.40 (ICD-10-CM) - Type 2 diabetes mellitus with diabetic neuropathy, without long-term current use of insulin (HCC)   THERAPY DIAG:  Unsteadiness on feet  Difficulty in walking, not elsewhere classified  Muscle weakness (generalized)  History of falling  Rationale for Evaluation and Treatment Rehabilitation  PERTINENT HISTORY: Balance, weakness. Can get around well without a cane. Standing still, pt loses balance. Has to hold onto things to stabilize himself. Has labradore thats about 77 year old and can walk his dog without a leash because the dog's pulling stabilizes him. Symptoms have been going on for a while and was told to have diabetic neuropathy. Had a fall in 2015 while working on the flooring in his den at home which caused R inguinal hernia S/P mesh placement. Pt states noticing problems in his legs since 2015. Pt also could not push up on his toes (tip toe) since then. No back pain currently but has had a hx of  back pain over the years. Decreased sensation B feet, almost not noticeable. Feels like he has a lot of weakness which makes it difficult to keep his balance. Has B LE swelling, comes and goes, not currently getting treated for it. No low back pain, just L arm and shoulder.   PRECAUTIONS: Fall risk; L5 anterolisthesis   SUBJECTIVE:   SUBJECTIVE STATEMENT: Walking is doing ok today.       PAIN:  Are you having pain? See subjective   TODAY'S TREATMENT:                                                                                                                                         DATE: 09/13/2022       Per MRI on 12/26/2016: 1. 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 There is probable chronic bilateral L5 nerve root encroachment.      No latex allergies.       Therapeutic exercise   Standing forward weight shifting onto forefeet 10x3 with 5 second holds  Standing B shoulder extension yellow band with scapular retraction 10x   Unsteady  Sitting with upright posture  Gentle manually resisted trunk extension isometrics in neutral to help decrease lumbar anterolisthesis  10x3 with 5 second holds  Seated manually resisted ankle PF (concentric, isometric (about 5 second holds) and eccentric contraction)   L 5x3   Soleus contraction palpated      R 5x3   Gastroc and solues muscle palpated.    Seated PT assisted ankle EV to end range with eccentric manual resistance  L 5x3  R 5x3   Seated ankle circles clockwise and counter clockwise   L 10x3 each way   With PT assist to end range for L   R 10x3 each way    Seated B scapular retraction with chin tuck 10x2 with 5 second holds to decrease Upper trap muscle tension to neck       Improved exercise technique, movement at target joints, use of target muscles after mod verbal, visual, tactile cues.           Response to treatment Pt tolerated session well without aggravation of symptoms.       Clinical impression Improving R ankle PF strength felt, at least 4/5 manually resisted in sitting. Worked on trunk strengthening to help decrease lumbar anterolisthesis. Continued working on improving R and L ankle strength to decrease difficulty ambulating. Pt tolerated session well without aggravation of symptoms. Pt will benefit from skilled physical therapy services to improve strength, balance, and function.         PATIENT EDUCATION: Education details: there-ex, HEP Person educated: Patient Education method:  Explanation, Demonstration, Tactile cues, Verbal cues, and Handouts Education comprehension: verbalized understanding and returned demonstration  HOME EXERCISE PROGRAM: Access Code: [email or web address] URL: [email or web address] Date: 07/24/2022 Prepared by:  [employer]   Exercises - Seated Transversus Abdominis Bracing  - 5 x daily - 7 x weekly - 3 sets - 10 reps - 5 seconds hold  - Supine Posterior Pelvic Tilt  - 1 x daily - 7 x weekly - 3 sets - 10 reps - 5 seconds hold - Long Sitting Ankle Plantar Flexion with Resistance  - 1 x daily - 7 x weekly - 3 sets - 10 reps  - Long Sitting Ankle Dorsiflexion AROM  - 1 x daily - 7 x weekly - 3 sets - 10 reps - 5 seconds hold  - Supine March with Posterior Pelvic Tilt  - 1 x daily - 7 x weekly - 3 sets - 10 reps - Curl Up with Arms Crossed  - 1 x daily - 7 x weekly - 3 sets - 10 reps - Supine Double Knee to Chest  - 1 x daily - 7 x weekly - 3 sets - 10 reps - 5 seconds hold - Bent Knee Fallouts with Alternating Legs  - 1 x daily - 7 x weekly - 3 sets - 10 reps - Standing Posterior Pelvic Tilt  - 3 x daily - 7 x weekly - 3 sets - 10 reps - 5 seconds hold  - Mini Squat with Counter Support  - 1 x daily - 7 x weekly - 3 sets - 10 reps  - Standing Hip Abduction with Resistance at Ankles and Counter Support  - 1 x daily - 7 x weekly - 1-3 sets - 10 reps - 5 seconds hold   Recommended for pt to perform scapular retractions and chin tucks at home (sitting) to help continue to decrease neck stiffness. Pt demonstrated and verbalized understanding.    PT Short Term Goals - 08/28/22 1617       PT SHORT TERM GOAL #1   Title Pt will be independent with his initial HEP to improve strength, balance, and function.    Baseline Pt has started his HEP. (07/24/2022); pt demonstrates independence with his inital HEP (08/28/2022)    Time 3    Period Weeks    Status Achieved    Target Date 08/18/22              PT Long Term Goals - 09/13/22 1606        PT LONG TERM GOAL #1   Title Pt will improve B ankle PF, DF, EV, and IV strength by at least 1/2 MMT grade to promote ability to ambulate, as well as perform standing tasks with less difficulty and decreased fall risk.    Baseline Manually resisted ankle: PF 3+/5 R, and L;  DF 3+/5 R, 3/5 L; EV 4-/5 R, 3+/5 L; IV 3+/5 R and L (07/24/2022); Manually resisted ankle PF 4-/5 R, 3+/5 L,  DF 3+/5 R, 3+/5 L, EV 4-/5 R, 3+/5 L, IV 3+/5 R, 3+/5 L (08/28/2022)    Time 8    Period Weeks    Status Partially Met    Target Date 09/22/22      PT LONG TERM GOAL #2   Title Pt will improve B hip extension and abduction strength to promote ability to perform standing tasks with less difficulty and decreased fall risk.    Baseline Hip extension 3+/5 R and L; hip abduction 4/5 R and L (07/24/2022); hip extension 4/5 R and L, hip abduction 4+/5 R and L (08/28/2022)    Time 8    Period Weeks  Status Achieved    Target Date 09/22/22      PT LONG TERM GOAL #3   Title Pt will improve his FOTO score by at least 10 points as a demonsration of improved function.    Baseline Foot FOTO 67 (07/24/2022); 67 (09/13/2022)    Time 8    Period Weeks    Status On-going    Target Date 09/22/22      PT LONG TERM GOAL #4   Title Pt will improve his DGI score to at least 19 points as a demonstration of improved balance and decreased fall risk.    Baseline DGI 13 (07/24/2022); 16 (08/28/2022)    Time 8    Period Weeks    Status Partially Met    Target Date 09/22/22              Plan - 09/13/22 1512     Clinical Impression Statement Improving R ankle PF strength felt, at least 4/5 manually resisted in sitting. Worked on trunk strengthening to help decrease lumbar anterolisthesis. Continued working on improving R and L ankle strength to decrease difficulty ambulating. Pt tolerated session well without aggravation of symptoms. Pt will benefit from skilled physical therapy services to improve strength, balance, and function.     Personal Factors and Comorbidities Comorbidity 3+;Age;Past/Current Experience;Time since onset of injury/illness/exacerbation;Profession;Fitness    Comorbidities HTN, DM, neuropathy    Examination-Activity Limitations Stand;Lift;Carry;Bend;Locomotion Level;Dressing;Squat;Transfers;Stairs    Stability/Clinical Decision Making Stable/Uncomplicated   Balance seem to be worsening based on subjective reports.   Rehab Potential Fair    PT Frequency 2x / week    PT Duration 8 weeks    PT Treatment/Interventions Therapeutic exercise;Balance training;Functional mobility training;Stair training;Therapeutic activities;Neuromuscular re-education;Patient/family education;Manual techniques;Electrical Stimulation;Gait training    PT Next Visit Plan lumbopelvic strengthening, stability and control, B hip, thigh, and ankle strengthening, balance, gait, manual techniques, modalities PRN.    PT Home Exercise Plan Medbridge Access Code: [email or web address]    Consulted and Agree with Plan of Care Patient                Loralyn Freshwater PT, DPT  09/13/2022, 4:07 PM

## 2022-09-18 ENCOUNTER — Ambulatory Visit: Payer: Medicare Other

## 2022-09-18 DIAGNOSIS — R262 Difficulty in walking, not elsewhere classified: Secondary | ICD-10-CM | POA: Diagnosis not present

## 2022-09-18 DIAGNOSIS — M6281 Muscle weakness (generalized): Secondary | ICD-10-CM

## 2022-09-18 DIAGNOSIS — Z9181 History of falling: Secondary | ICD-10-CM | POA: Diagnosis not present

## 2022-09-18 DIAGNOSIS — R2681 Unsteadiness on feet: Secondary | ICD-10-CM

## 2022-09-18 NOTE — Therapy (Signed)
OUTPATIENT PHYSICAL THERAPY TREATMENT NOTE    Patient Name: Tyler Morgan MRN: [id number] DOB:[birth year], 77 y.o., male Today's Date: 09/18/2022  PCP: [name], NP  REFERRING PROVIDER: [name], NP   END OF SESSION:  PT End of Session - 09/18/22 1520     Visit Number 16    Number of Visits 17    Date for PT Re-Evaluation 09/21/22    Progress Note Due on Visit 10    PT Start Time 1521    PT Stop Time 1601    PT Time Calculation (min) 40 min    Activity Tolerance Patient tolerated treatment well    Behavior During Therapy WFL for tasks assessed/performed                           Past Medical History:  Diagnosis Date   Diabetes mellitus without complication (HCC)    ED (erectile dysfunction)    Hypertension    Motion sickness    boats   Neuropathy    Prostate enlargement    Urinary retention    Past Surgical History:  Procedure Laterality Date   BASAL CELL CARCINOMA EXCISION     nose    CATARACT EXTRACTION Bilateral 1986, 2014   COLONOSCOPY WITH PROPOFOL N/A 03/27/2018   Procedure: COLONOSCOPY WITH BIOPSIES;  Surgeon: Toney Reil, MD;  Location: [facility];  Service: Endoscopy;  Laterality: N/A;  Diabetic - oral meds   EYE SURGERY Bilateral [phone]   FRACTURE SURGERY Right    as a child/ right arm   HERNIA REPAIR Right 01-13-14   inguinal hernia   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 N/A 02/07/2017   Procedure: 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;  Surgeon: Vanna Scotland, MD;  Location: [facility];  Service: Urology;  Laterality: N/A;   POLYPECTOMY N/A 03/27/2018   Procedure: POLYPECTOMY;  Surgeon: Toney Reil, MD;  Location: [facility];  Service: Endoscopy;  Laterality: N/A;   RETINAL DETACHMENT SURGERY  Jan 2015   TONSILLECTOMY  1955   Patient Active Problem List   Diagnosis Date Noted   PVD (peripheral vascular disease) (HCC) 11/30/2021    B12 deficiency 01/29/2019   History of prostate cancer 12/13/2017   Hemorrhoids 12/13/2017   Hyperlipidemia associated with type 2 diabetes mellitus (HCC) 04/02/2017   Advanced care planning/counseling discussion 10/11/2016   Type 2 diabetes mellitus with diabetic neuropathy (HCC) 12/15/2014   Hypertension associated with diabetes (HCC)     REFERRING DIAG: E11.40 (ICD-10-CM) - Type 2 diabetes mellitus with diabetic neuropathy, without long-term current use of insulin (HCC)   THERAPY DIAG:  Unsteadiness on feet  Difficulty in walking, not elsewhere classified  Muscle weakness (generalized)  History of falling  Rationale for Evaluation and Treatment Rehabilitation  PERTINENT HISTORY: Balance, weakness. Can get around well without a cane. Standing still, pt loses balance. Has to hold onto things to stabilize himself. Has labradore thats about 77 year old and can walk his dog without a leash because the dog's pulling stabilizes him. Symptoms have been going on for a while and was told to have diabetic neuropathy. Had a fall in 2015 while working on the flooring in his den at home which caused R inguinal hernia S/P mesh placement. Pt states noticing problems in his legs since 2015. Pt also could not push up on his toes (tip toe) since then. No back pain currently but has had a hx  of back pain over the years. Decreased sensation B feet, almost not noticeable. Feels like he has a lot of weakness which makes it difficult to keep his balance. Has B LE swelling, comes and goes, not currently getting treated for it. No low back pain, just L arm and shoulder.   PRECAUTIONS: Fall risk; L5 anterolisthesis   SUBJECTIVE:   SUBJECTIVE STATEMENT: Everything seems to be working pretty well. Thinks his walking is getting a little better.       PAIN:  Are you having pain? See subjective   TODAY'S TREATMENT:                                                                                                                                          DATE: 09/18/2022      Per MRI on 12/26/2016: 1. 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 There is probable chronic bilateral L5 nerve root encroachment.      No latex allergies.       Therapeutic exercise   Sitting with upright posture  Gentle manually resisted trunk extension isometrics in neutral to help decrease lumbar anterolisthesis  10x3 with 5 second holds  Seated manually resisted ankle PF (concentric, isometric (about 5 second holds) and eccentric contraction)   L 10x2   Soleus contraction palpated      R 10x2   Gastroc and solues muscle palpated.   Seated trunk flexion stretch 30 seconds x 3  Seated PT assisted ankle EV to end range with eccentric manual resistance  L 5x3  R 5x3  Seated ankle circles clockwise and counter clockwise   L 10x3 each way   With PT assist to end range for L   R 10x3 each way   Seated B scapular retraction with chin tuck 10x2 with 5 second holds to decrease Upper trap muscle tension to neck     Improved exercise technique, movement at target joints, use of target muscles after mod verbal, visual, tactile cues.           Response to treatment Pt tolerated session well without aggravation of symptoms.       Clinical impression Continued working on trunk strengthening to help decrease lumbar anterolisthesis. Continued working on improving R and L ankle strength to decrease difficulty ambulating. Pt tolerated session well without aggravation of symptoms. Pt will benefit from skilled physical therapy services to improve strength, balance, and function.         PATIENT EDUCATION: Education details: there-ex, HEP Person educated: Patient Education method: Explanation, Demonstration, Tactile cues, Verbal cues, and Handouts Education comprehension: verbalized understanding and returned  demonstration  HOME EXERCISE PROGRAM: Access Code: [email or web address] URL: [email or web address] Date: 07/24/2022 Prepared by: Loralyn Freshwater   Exercises - Seated Transversus Abdominis Bracing  - 5 x daily - 7 x weekly - 3 sets -  10 reps - 5 seconds hold  - Supine Posterior Pelvic Tilt  - 1 x daily - 7 x weekly - 3 sets - 10 reps - 5 seconds hold - Long Sitting Ankle Plantar Flexion with Resistance  - 1 x daily - 7 x weekly - 3 sets - 10 reps  - Long Sitting Ankle Dorsiflexion AROM  - 1 x daily - 7 x weekly - 3 sets - 10 reps - 5 seconds hold  - Supine March with Posterior Pelvic Tilt  - 1 x daily - 7 x weekly - 3 sets - 10 reps - Curl Up with Arms Crossed  - 1 x daily - 7 x weekly - 3 sets - 10 reps - Supine Double Knee to Chest  - 1 x daily - 7 x weekly - 3 sets - 10 reps - 5 seconds hold - Bent Knee Fallouts with Alternating Legs  - 1 x daily - 7 x weekly - 3 sets - 10 reps - Standing Posterior Pelvic Tilt  - 3 x daily - 7 x weekly - 3 sets - 10 reps - 5 seconds hold  - Mini Squat with Counter Support  - 1 x daily - 7 x weekly - 3 sets - 10 reps  - Standing Hip Abduction with Resistance at Ankles and Counter Support  - 1 x daily - 7 x weekly - 1-3 sets - 10 reps - 5 seconds hold   Recommended for pt to perform scapular retractions and chin tucks at home (sitting) to help continue to decrease neck stiffness. Pt demonstrated and verbalized understanding.    PT Short Term Goals - 08/28/22 1617       PT SHORT TERM GOAL #1   Title Pt will be independent with his initial HEP to improve strength, balance, and function.    Baseline Pt has started his HEP. (07/24/2022); pt demonstrates independence with his inital HEP (08/28/2022)    Time 3    Period Weeks    Status Achieved    Target Date 08/18/22              PT Long Term Goals - 09/13/22 1606       PT LONG TERM GOAL #1   Title Pt will improve B ankle PF, DF, EV, and IV strength by at least 1/2 MMT grade to promote ability  to ambulate, as well as perform standing tasks with less difficulty and decreased fall risk.    Baseline Manually resisted ankle: PF 3+/5 R, and L;  DF 3+/5 R, 3/5 L; EV 4-/5 R, 3+/5 L; IV 3+/5 R and L (07/24/2022); Manually resisted ankle PF 4-/5 R, 3+/5 L,  DF 3+/5 R, 3+/5 L, EV 4-/5 R, 3+/5 L, IV 3+/5 R, 3+/5 L (08/28/2022)    Time 8    Period Weeks    Status Partially Met    Target Date 09/22/22      PT LONG TERM GOAL #2   Title Pt will improve B hip extension and abduction strength to promote ability to perform standing tasks with less difficulty and decreased fall risk.    Baseline Hip extension 3+/5 R and L; hip abduction 4/5 R and L (07/24/2022); hip extension 4/5 R and L, hip abduction 4+/5 R and L (08/28/2022)    Time 8    Period Weeks    Status Achieved    Target Date 09/22/22      PT LONG TERM GOAL #3   Title Pt will  improve his FOTO score by at least 10 points as a demonsration of improved function.    Baseline Foot FOTO 67 (07/24/2022); 67 (09/13/2022)    Time 8    Period Weeks    Status On-going    Target Date 09/22/22      PT LONG TERM GOAL #4   Title Pt will improve his DGI score to at least 19 points as a demonstration of improved balance and decreased fall risk.    Baseline DGI 13 (07/24/2022); 16 (08/28/2022)    Time 8    Period Weeks    Status Partially Met    Target Date 09/22/22              Plan - 09/18/22 1607     Clinical Impression Statement Continued working on trunk strengthening to help decrease lumbar anterolisthesis. Continued working on improving R and L ankle strength to decrease difficulty ambulating. Pt tolerated session well without aggravation of symptoms. Pt will benefit from skilled physical therapy services to improve strength, balance, and function.    Personal Factors and Comorbidities Comorbidity 3+;Age;Past/Current Experience;Time since onset of injury/illness/exacerbation;Profession;Fitness    Comorbidities HTN, DM, neuropathy     Examination-Activity Limitations Stand;Lift;Carry;Bend;Locomotion Level;Dressing;Squat;Transfers;Stairs    Stability/Clinical Decision Making Stable/Uncomplicated   Balance seem to be worsening based on subjective reports.   Rehab Potential Fair    PT Frequency 2x / week    PT Duration 8 weeks    PT Treatment/Interventions Therapeutic exercise;Balance training;Functional mobility training;Stair training;Therapeutic activities;Neuromuscular re-education;Patient/family education;Manual techniques;Electrical Stimulation;Gait training    PT Next Visit Plan lumbopelvic strengthening, stability and control, B hip, thigh, and ankle strengthening, balance, gait, manual techniques, modalities PRN.    PT Home Exercise Plan Medbridge Access Code: [email or web address]    Consulted and Agree with Plan of Care Patient                 Loralyn Freshwater PT, DPT  09/18/2022, 4:09 PM

## 2022-09-20 ENCOUNTER — Ambulatory Visit: Payer: Medicare Other

## 2022-09-20 DIAGNOSIS — M6281 Muscle weakness (generalized): Secondary | ICD-10-CM

## 2022-09-20 DIAGNOSIS — R262 Difficulty in walking, not elsewhere classified: Secondary | ICD-10-CM

## 2022-09-20 DIAGNOSIS — R2681 Unsteadiness on feet: Secondary | ICD-10-CM

## 2022-09-20 DIAGNOSIS — Z9181 History of falling: Secondary | ICD-10-CM | POA: Diagnosis not present

## 2022-09-20 NOTE — Therapy (Signed)
OUTPATIENT PHYSICAL THERAPY TREATMENT NOTE    Patient Name: Tyler Morgan MRN: [id number] DOB:Jan 30, 1946, 77 y.o., male Today's Date: 09/20/2022  PCP: [name], NP  REFERRING PROVIDER: [name], NP   END OF SESSION:  PT End of Session - 09/20/22 1517     Visit Number 17    Number of Visits 33    Date for PT Re-Evaluation 11/17/22    Progress Note Due on Visit 10    PT Start Time 1518    PT Stop Time 1600    PT Time Calculation (min) 42 min    Activity Tolerance Patient tolerated treatment well    Behavior During Therapy WFL for tasks assessed/performed                            Past Medical History:  Diagnosis Date   Diabetes mellitus without complication (HCC)    ED (erectile dysfunction)    Hypertension    Motion sickness    boats   Neuropathy    Prostate enlargement    Urinary retention    Past Surgical History:  Procedure Laterality Date   BASAL CELL CARCINOMA EXCISION     nose    CATARACT EXTRACTION Bilateral 1986, 2014   COLONOSCOPY WITH PROPOFOL N/A 03/27/2018   Procedure: COLONOSCOPY WITH BIOPSIES;  Surgeon: Toney Reil, MD;  Location: [facility];  Service: Endoscopy;  Laterality: N/A;  Diabetic - oral meds   EYE SURGERY Bilateral [phone]   FRACTURE SURGERY Right    as a child/ right arm   HERNIA REPAIR Right 01-13-14   inguinal hernia   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 N/A 02/07/2017   Procedure: 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;  Surgeon: Vanna Scotland, MD;  Location: [facility];  Service: Urology;  Laterality: N/A;   POLYPECTOMY N/A 03/27/2018   Procedure: POLYPECTOMY;  Surgeon: Toney Reil, MD;  Location: [facility];  Service: Endoscopy;  Laterality: N/A;   RETINAL DETACHMENT SURGERY  Jan 2015   TONSILLECTOMY  1955   Patient Active Problem List   Diagnosis Date Noted   PVD (peripheral vascular disease) (HCC) 11/30/2021    B12 deficiency 01/29/2019   History of prostate cancer 12/13/2017   Hemorrhoids 12/13/2017   Hyperlipidemia associated with type 2 diabetes mellitus (HCC) 04/02/2017   Advanced care planning/counseling discussion 10/11/2016   Type 2 diabetes mellitus with diabetic neuropathy (HCC) 12/15/2014   Hypertension associated with diabetes (HCC)     REFERRING DIAG: E11.40 (ICD-10-CM) - Type 2 diabetes mellitus with diabetic neuropathy, without long-term current use of insulin (HCC)   THERAPY DIAG:  Unsteadiness on feet - Plan: PT plan of care cert/re-cert  Difficulty in walking, not elsewhere classified - Plan: PT plan of care cert/re-cert  Muscle weakness (generalized) - Plan: PT plan of care cert/re-cert  History of falling - Plan: PT plan of care cert/re-cert  Rationale for Evaluation and Treatment Rehabilitation  PERTINENT HISTORY: Balance, weakness. Can get around well without a cane. Standing still, pt loses balance. Has to hold onto things to stabilize himself. Has labradore thats about 77 year old and can walk his dog without a leash because the dog's pulling stabilizes him. Symptoms have been going on for a while and was told to have diabetic neuropathy. Had a fall in 2015 while working on the flooring in his den at home which caused R inguinal hernia S/P mesh placement. Pt states  noticing problems in his legs since 2015. Pt also could not push up on his toes (tip toe) since then. No back pain currently but has had a hx of back pain over the years. Decreased sensation B feet, almost not noticeable. Feels like he has a lot of weakness which makes it difficult to keep his balance. Has B LE swelling, comes and goes, not currently getting treated for it. No low back pain, just L arm and shoulder.   PRECAUTIONS: Fall risk; L5 anterolisthesis   SUBJECTIVE:   SUBJECTIVE STATEMENT: Push mowed most of his yard. Walking is pretty good. Balance seems to be no worse. Thinks its getting a little  better. No pain currently.      PAIN:  Are you having pain? See subjective   TODAY'S TREATMENT:                                                                                                                                         DATE: 09/20/2022      Per MRI on 12/26/2016: 1. 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 There is probable chronic bilateral L5 nerve root encroachment.      No latex allergies.       Therapeutic exercise    Manually resisted ankle PF, DF, EV, IV 1x each way for each LE   Reviewed progress with PT towards goals   Seated B ankle DF with 1 kg medicine ball 10x5 seconds    Prone with 2 pillows under abdomen manually resisted hip extension, and S/L hip abduction 1x each way for each LE    Directed patient with gait with normal gait speed, with changes in speed, 180 degree pivot turn, with R and L cervical rotation position, with cervical flexion and extension position, stepping around obstacles, stepping over an obstacle, ascending and descending 4 regular steps with UE assist   Reviewed POC 2x/week for 8 weeks  Sitting with upright posture  Gentle manually resisted trunk extension isometrics in neutral to help decrease lumbar anterolisthesis  10x3 with 5 second holds     Improved exercise technique, movement at target joints, use of target muscles after mod verbal, visual, tactile cues.           Response to treatment Pt tolerated session well without aggravation of symptoms.       Clinical impression  Pt demonstrates improved bilateral hip, overall improved B ankle strength, and slight improved DGI score suggesting improved balance since initial evaluation. Functional status seems to remain unchanged. Continued working on improving posture, trunk and overall LE strength to promote ability to ambulate with less difficulty. Pt tolerated  session well without aggravation of symptoms. Pt will benefit from skilled physical therapy services to improve strength, balance, and function.         PATIENT EDUCATION: Education details: there-ex,  HEP Person educated: Patient Education method: Explanation, Demonstration, Tactile cues, Verbal cues, and Handouts Education comprehension: verbalized understanding and returned demonstration  HOME EXERCISE PROGRAM: Access Code: [email or web address] URL: [email or web address] Date: 07/24/2022 Prepared by: Loralyn Freshwater   Exercises - Seated Transversus Abdominis Bracing  - 5 x daily - 7 x weekly - 3 sets - 10 reps - 5 seconds hold  - Supine Posterior Pelvic Tilt  - 1 x daily - 7 x weekly - 3 sets - 10 reps - 5 seconds hold - Long Sitting Ankle Plantar Flexion with Resistance  - 1 x daily - 7 x weekly - 3 sets - 10 reps  - Long Sitting Ankle Dorsiflexion AROM  - 1 x daily - 7 x weekly - 3 sets - 10 reps - 5 seconds hold  - Supine March with Posterior Pelvic Tilt  - 1 x daily - 7 x weekly - 3 sets - 10 reps - Curl Up with Arms Crossed  - 1 x daily - 7 x weekly - 3 sets - 10 reps - Supine Double Knee to Chest  - 1 x daily - 7 x weekly - 3 sets - 10 reps - 5 seconds hold - Bent Knee Fallouts with Alternating Legs  - 1 x daily - 7 x weekly - 3 sets - 10 reps - Standing Posterior Pelvic Tilt  - 3 x daily - 7 x weekly - 3 sets - 10 reps - 5 seconds hold  - Mini Squat with Counter Support  - 1 x daily - 7 x weekly - 3 sets - 10 reps  - Standing Hip Abduction with Resistance at Ankles and Counter Support  - 1 x daily - 7 x weekly - 1-3 sets - 10 reps - 5 seconds hold   Recommended for pt to perform scapular retractions and chin tucks at home (sitting) to help continue to decrease neck stiffness. Pt demonstrated and verbalized understanding.    - Seated Toe Raise  - 1 x daily - 7 x weekly - 1-3 sets - 10 reps - 5 seconds hold    PT Short Term Goals - 08/28/22 1617       PT SHORT  TERM GOAL #1   Title Pt will be independent with his initial HEP to improve strength, balance, and function.    Baseline Pt has started his HEP. (07/24/2022); pt demonstrates independence with his inital HEP (08/28/2022)    Time 3    Period Weeks    Status Achieved    Target Date 08/18/22              PT Long Term Goals - 09/20/22 1519       PT LONG TERM GOAL #1   Title Pt will improve B ankle PF, DF, EV, and IV strength by at least 1/2 MMT grade to promote ability to ambulate, as well as perform standing tasks with less difficulty and decreased fall risk.    Baseline Manually resisted ankle: PF 3+/5 R, and L;  DF 3+/5 R, 3/5 L; EV 4-/5 R, 3+/5 L; IV 3+/5 R and L (07/24/2022); Manually resisted ankle PF 4-/5 R, 3+/5 L,  DF 3+/5 R, 3+/5 L, EV 4-/5 R, 3+/5 L, IV 3+/5 R, 3+/5 L (08/28/2022); Manually resisted ankle: PF 4/5 R, 4-/5 L, DF 3+/5 R, 3+/5 L, EV 4/5 R, 4-/5L, IV 4-/5 R, 4-/5 L (09/20/2022)    Time 8    Period Weeks    Status Partially Met  Target Date 11/17/22      PT LONG TERM GOAL #2   Title Pt will improve B hip extension and abduction strength to promote ability to perform standing tasks with less difficulty and decreased fall risk.    Baseline Hip extension 3+/5 R and L; hip abduction 4/5 R and L (07/24/2022); hip extension 4/5 R and L, hip abduction 4+/5 R and L (08/28/2022); Hip extension 4/5 R, 4/5 L, hip abduction 4+/5 R, 5/5 L (09/20/2022)    Time 8    Period Weeks    Status Achieved    Target Date 09/22/22      PT LONG TERM GOAL #3   Title Pt will improve his FOTO score by at least 10 points as a demonsration of improved function.    Baseline Foot FOTO 67 (07/24/2022); 67 (09/13/2022)    Time 8    Period Weeks    Status On-going    Target Date 11/17/22      PT LONG TERM GOAL #4   Title Pt will improve his DGI score to at least 19 points as a demonstration of improved balance and decreased fall risk.    Baseline DGI 13 (07/24/2022); 16 (08/28/2022);    Time 8    Period  Weeks    Status Partially Met    Target Date 11/17/22              Plan - 09/20/22 1516     Clinical Impression Statement Pt demonstrates improved bilateral hip, overall improved B ankle strength, and slight improved DGI score suggesting improved balance since initial evaluation. Functional status seems to remain unchanged. Continued working on improving posture, trunk and overall LE strength to promote ability to ambulate with less difficulty. Pt tolerated session well without aggravation of symptoms. Pt will benefit from skilled physical therapy services to improve strength, balance, and function.    Personal Factors and Comorbidities Comorbidity 3+;Age;Past/Current Experience;Time since onset of injury/illness/exacerbation;Profession;Fitness    Comorbidities HTN, DM, neuropathy    Examination-Activity Limitations Stand;Lift;Carry;Bend;Locomotion Level;Dressing;Squat;Transfers;Stairs    Stability/Clinical Decision Making Stable/Uncomplicated   Balance seem to be worsening based on subjective reports.   Rehab Potential Fair    PT Frequency 2x / week    PT Duration 8 weeks    PT Treatment/Interventions Therapeutic exercise;Balance training;Functional mobility training;Stair training;Therapeutic activities;Neuromuscular re-education;Patient/family education;Manual techniques;Electrical Stimulation;Gait training    PT Next Visit Plan lumbopelvic strengthening, stability and control, B hip, thigh, and ankle strengthening, balance, gait, manual techniques, modalities PRN.    PT Home Exercise Plan Medbridge Access Code: [email or web address]    Consulted and Agree with Plan of Care Patient                 Loralyn Freshwater PT, DPT  09/20/2022, 4:12 PM

## 2022-09-21 NOTE — Patient Instructions (Signed)
Be Involved in Caring For Your Health:  Taking Medications When medications are taken as directed, they can greatly improve your health. But if they are not taken as prescribed, they may not work. In some cases, not taking them correctly can be harmful. To help ensure your treatment remains effective and safe, understand your medications and how to take them. Bring your medications to each visit for review by your provider.  Your lab results, notes, and after visit summary will be available on My Chart. We strongly encourage you to use this feature. If lab results are abnormal the clinic will contact you with the appropriate steps. If the clinic does not contact you assume the results are satisfactory. You can always view your results on My Chart. If you have questions regarding your health or results, please contact the clinic during office hours. You can also ask questions on My Chart.  We at [facility] are grateful that you chose us to provide your care. We strive to provide evidence-based and compassionate care and are always looking for feedback. If you get a survey from the clinic please complete this so we can hear your opinions.  DASH Eating Plan DASH stands for Dietary Approaches to Stop Hypertension. The DASH eating plan is a healthy eating plan that has been shown to: Lower high blood pressure (hypertension). Reduce your risk for type 2 diabetes, heart disease, and stroke. Help with weight loss. What are tips for following this plan? Reading food labels Check food labels for the amount of salt (sodium) per serving. Choose foods with less than 5 percent of the Daily Value (DV) of sodium. In general, foods with less than 300 milligrams (mg) of sodium per serving fit into this eating plan. To find whole grains, look for the word "whole" as the first word in the ingredient list. Shopping Buy products labeled as "low-sodium" or "no salt added." Buy fresh foods. Avoid canned  foods and pre-made or frozen meals. Cooking Try not to add salt when you cook. Use salt-free seasonings or herbs instead of table salt or sea salt. Check with your health care provider or pharmacist before using salt substitutes. Do not fry foods. Cook foods in healthy ways, such as baking, boiling, grilling, roasting, or broiling. Cook using oils that are good for your heart. These include olive, canola, avocado, soybean, and sunflower oil. Meal planning  Eat a balanced diet. This should include: 4 or more servings of fruits and 4 or more servings of vegetables each day. Try to fill half of your plate with fruits and vegetables. 6-8 servings of whole grains each day. 6 or less servings of lean meat, poultry, or fish each day. 1 oz is 1 serving. A 3 oz (85 g) serving of meat is about the same size as the palm of your hand. One egg is 1 oz (28 g). 2-3 servings of low-fat dairy each day. One serving is 1 cup (237 mL). 1 serving of nuts, seeds, or beans 5 times each week. 2-3 servings of heart-healthy fats. Healthy fats called omega-3 fatty acids are found in foods such as walnuts, flaxseeds, fortified milks, and eggs. These fats are also found in cold-water fish, such as sardines, salmon, and mackerel. Limit how much you eat of: Canned or prepackaged foods. Food that is high in trans fat, such as fried foods. Food that is high in saturated fat, such as fatty meat. Desserts and other sweets, sugary drinks, and other foods with added sugar. Full-fat   dairy products. Do not salt foods before eating. Do not eat more than 4 egg yolks a week. Try to eat at least 2 vegetarian meals a week. Eat more home-cooked food and less restaurant, buffet, and fast food. Lifestyle When eating at a restaurant, ask if your food can be made with less salt or no salt. If you drink alcohol: Limit how much you have to: 0-1 drink a day if you are male. 0-2 drinks a day if you are male. Know how much alcohol is in  your drink. In the [place] one drink is one 12 oz bottle of beer (355 mL), one 5 oz glass of wine (148 mL), or one 1 oz glass of hard liquor (44 mL). General information Avoid eating more than 2,300 mg of salt a day. If you have hypertension, you may need to reduce your sodium intake to 1,500 mg a day. Work with your provider to stay at a healthy body weight or lose weight. Ask what the best weight range is for you. On most days of the week, get at least 30 minutes of exercise that causes your heart to beat faster. This may include walking, swimming, or biking. Work with your provider or dietitian to adjust your eating plan to meet your specific calorie needs. What foods should I eat? Fruits All fresh, dried, or frozen fruit. Canned fruits that are in their natural juice and do not have sugar added to them. Vegetables Fresh or frozen vegetables that are raw, steamed, roasted, or grilled. Low-sodium or reduced-sodium tomato and vegetable juice. Low-sodium or reduced-sodium tomato sauce and tomato paste. Low-sodium or reduced-sodium canned vegetables. Grains Whole-grain or whole-wheat bread. Whole-grain or whole-wheat pasta. Brown rice. Oatmeal. Quinoa. Bulgur. Whole-grain and low-sodium cereals. Pita bread. Low-fat, low-sodium crackers. Whole-wheat flour tortillas. Meats and other proteins Skinless chicken or turkey. Ground chicken or turkey. Pork with fat trimmed off. Fish and seafood. Egg whites. Dried beans, peas, or lentils. Unsalted nuts, nut butters, and seeds. Unsalted canned beans. Lean cuts of beef with fat trimmed off. Low-sodium, lean precooked or cured meat, such as sausages or meat loaves. Dairy Low-fat (1%) or fat-free (skim) milk. Reduced-fat, low-fat, or fat-free cheeses. Nonfat, low-sodium ricotta or cottage cheese. Low-fat or nonfat yogurt. Low-fat, low-sodium cheese. Fats and oils Soft margarine without trans fats. Vegetable oil. Reduced-fat, low-fat, or light mayonnaise and salad  dressings (reduced-sodium). Canola, safflower, olive, avocado, soybean, and sunflower oils. Avocado. Seasonings and condiments Herbs. Spices. Seasoning mixes without salt. Other foods Unsalted popcorn and pretzels. Fat-free sweets. The items listed above may not be all the foods and drinks you can have. Talk to a dietitian to learn more. What foods should I avoid? Fruits Canned fruit in a light or heavy syrup. Fried fruit. Fruit in cream or butter sauce. Vegetables Creamed or fried vegetables. Vegetables in a cheese sauce. Regular canned vegetables that are not marked as low-sodium or reduced-sodium. Regular canned tomato sauce and paste that are not marked as low-sodium or reduced-sodium. Regular tomato and vegetable juices that are not marked as low-sodium or reduced-sodium. Pickles. Olives. Grains Baked goods made with fat, such as croissants, muffins, or some breads. Dry pasta or rice meal packs. Meats and other proteins Fatty cuts of meat. Ribs. Fried meat. Bacon. Bologna, salami, and other precooked or cured meats, such as sausages or meat loaves, that are not lean and low in sodium. Fat from the back of a pig (fatback). Bratwurst. Salted nuts and seeds. Canned beans with added salt. Canned   or smoked fish. Whole eggs or egg yolks. Chicken or turkey with skin. Dairy Whole or 2% milk, cream, and half-and-half. Whole or full-fat cream cheese. Whole-fat or sweetened yogurt. Full-fat cheese. Nondairy creamers. Whipped toppings. Processed cheese and cheese spreads. Fats and oils Butter. Stick margarine. Lard. Shortening. Ghee. Bacon fat. Tropical oils, such as coconut, palm kernel, or palm oil. Seasonings and condiments Onion salt, garlic salt, seasoned salt, table salt, and sea salt. Worcestershire sauce. Tartar sauce. Barbecue sauce. Teriyaki sauce. Soy sauce, including reduced-sodium soy sauce. Steak sauce. Canned and packaged gravies. Fish sauce. Oyster sauce. Cocktail sauce. Store-bought  horseradish. Ketchup. Mustard. Meat flavorings and tenderizers. Bouillon cubes. Hot sauces. Pre-made or packaged marinades. Pre-made or packaged taco seasonings. Relishes. Regular salad dressings. Other foods Salted popcorn and pretzels. The items listed above may not be all the foods and drinks you should avoid. Talk to a dietitian to learn more. Where to find more information National Heart, Lung, and Blood Institute (NHLBI): nhlbi.nih.gov American Heart Association (AHA): heart.org Academy of Nutrition and Dietetics: eatright.org National Kidney Foundation (NKF): kidney.org This information is not intended to replace advice given to you by your health care provider. Make sure you discuss any questions you have with your health care provider. Document Revised: 02/23/2022 Document Reviewed: 02/23/2022 Elsevier Patient Education  2024 Elsevier Inc.  

## 2022-09-25 ENCOUNTER — Ambulatory Visit: Payer: Medicare Other | Attending: Nurse Practitioner

## 2022-09-25 DIAGNOSIS — M6281 Muscle weakness (generalized): Secondary | ICD-10-CM | POA: Insufficient documentation

## 2022-09-25 DIAGNOSIS — R262 Difficulty in walking, not elsewhere classified: Secondary | ICD-10-CM | POA: Insufficient documentation

## 2022-09-25 DIAGNOSIS — R2681 Unsteadiness on feet: Secondary | ICD-10-CM | POA: Diagnosis not present

## 2022-09-25 DIAGNOSIS — Z9181 History of falling: Secondary | ICD-10-CM | POA: Insufficient documentation

## 2022-09-25 NOTE — Therapy (Signed)
OUTPATIENT PHYSICAL THERAPY TREATMENT NOTE    Patient Name: Tyler Morgan MRN: [id number] DOB:15-Jun-1945, 77 y.o., male Today's Date: 09/25/2022  PCP: [name], NP  REFERRING PROVIDER: [name], NP   END OF SESSION:  PT End of Session - 09/25/22 1516     Visit Number 18    Number of Visits 33    Date for PT Re-Evaluation 11/17/22    Progress Note Due on Visit 10    PT Start Time 1520    PT Stop Time 1600    PT Time Calculation (min) 40 min    Activity Tolerance Patient tolerated treatment well    Behavior During Therapy WFL for tasks assessed/performed                             Past Medical History:  Diagnosis Date   Diabetes mellitus without complication (HCC)    ED (erectile dysfunction)    Hypertension    Motion sickness    boats   Neuropathy    Prostate enlargement    Urinary retention    Past Surgical History:  Procedure Laterality Date   BASAL CELL CARCINOMA EXCISION     nose    CATARACT EXTRACTION Bilateral 1986, 2014   COLONOSCOPY WITH PROPOFOL N/A 03/27/2018   Procedure: COLONOSCOPY WITH BIOPSIES;  Surgeon: Toney Reil, MD;  Location: [facility];  Service: Endoscopy;  Laterality: N/A;  Diabetic - oral meds   EYE SURGERY Bilateral [phone]   FRACTURE SURGERY Right    as a child/ right arm   HERNIA REPAIR Right 01-13-14   inguinal hernia   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 N/A 02/07/2017   Procedure: 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;  Surgeon: Vanna Scotland, MD;  Location: [facility];  Service: Urology;  Laterality: N/A;   POLYPECTOMY N/A 03/27/2018   Procedure: POLYPECTOMY;  Surgeon: Toney Reil, MD;  Location: [facility];  Service: Endoscopy;  Laterality: N/A;   RETINAL DETACHMENT SURGERY  Jan 2015   TONSILLECTOMY  1955   Patient Active Problem List   Diagnosis Date Noted   PVD (peripheral vascular disease) (HCC) 11/30/2021    B12 deficiency 01/29/2019   History of prostate cancer 12/13/2017   Hemorrhoids 12/13/2017   Hyperlipidemia associated with type 2 diabetes mellitus (HCC) 04/02/2017   Advanced care planning/counseling discussion 10/11/2016   Type 2 diabetes mellitus with diabetic neuropathy (HCC) 12/15/2014   Hypertension associated with diabetes (HCC)     REFERRING DIAG: E11.40 (ICD-10-CM) - Type 2 diabetes mellitus with diabetic neuropathy, without long-term current use of insulin (HCC)   THERAPY DIAG:  Unsteadiness on feet  Difficulty in walking, not elsewhere classified  Muscle weakness (generalized)  History of falling  Rationale for Evaluation and Treatment Rehabilitation  PERTINENT HISTORY: Balance, weakness. Can get around well without a cane. Standing still, pt loses balance. Has to hold onto things to stabilize himself. Has labradore thats about 77 year old and can walk his dog without a leash because the dog's pulling stabilizes him. Symptoms have been going on for a while and was told to have diabetic neuropathy. Had a fall in 2015 while working on the flooring in his den at home which caused R inguinal hernia S/P mesh placement. Pt states noticing problems in his legs since 2015. Pt also could not push up on his toes (tip toe) since then. No back pain currently but has had  a hx of back pain over the years. Decreased sensation B feet, almost not noticeable. Feels like he has a lot of weakness which makes it difficult to keep his balance. Has B LE swelling, comes and goes, not currently getting treated for it. No low back pain, just L arm and shoulder.   PRECAUTIONS: Fall risk; L5 anterolisthesis   SUBJECTIVE:   SUBJECTIVE STATEMENT: Strength and balance seems to be ok. The exercise with the ankle DF with 1 lbs, pt did not do well on. No pain currently except L shoulder hurts from time to time.       PAIN:  Are you having pain? See subjective   TODAY'S TREATMENT:                                                                                                                                          DATE: 09/25/2022      Per MRI on 12/26/2016: 1. 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 There is probable chronic bilateral L5 nerve root encroachment.      No latex allergies.       Therapeutic exercise   Sitting with upright posture  Gentle manually resisted trunk extension isometrics in neutral to help decrease lumbar anterolisthesis  10x3 with 5 second holds  Standing B forward weight shifting onto B forefeet with heels on floor 10x3 with 5 second holds  Seated B ankle DF with 1 kg medicine ball 10x5 seconds   Seated manually resisted ankle PF (concentric, isometric (about 5 second holds) and eccentric contraction)                 L 10x3                         Soleus contraction palpated                                         R 10x3                         Gastroc and solues muscle palpated.                 Seated PT assisted ankle EV to end range with eccentric manual resistance                L 10x2                R 10x2    Improved exercise technique, movement at target joints, use of target muscles after mod verbal, visual, tactile cues.         Response to treatment Pt tolerated session well without aggravation of symptoms.  Clinical impression  Continued working on improving posture, trunk and B ankle strength to promote ability to ambulate with less difficulty. Pt tolerated session well without aggravation of symptoms. Pt will benefit from skilled physical therapy services to improve strength, balance, and function.         PATIENT EDUCATION: Education details: there-ex, HEP Person educated: Patient Education method: Explanation, Demonstration, Tactile cues, Verbal cues, and Handouts Education comprehension: verbalized understanding  and returned demonstration  HOME EXERCISE PROGRAM: Access Code: [email or web address] URL: [email or web address] Date: 07/24/2022 Prepared by: Loralyn Freshwater   Exercises - Seated Transversus Abdominis Bracing  - 5 x daily - 7 x weekly - 3 sets - 10 reps - 5 seconds hold  - Supine Posterior Pelvic Tilt  - 1 x daily - 7 x weekly - 3 sets - 10 reps - 5 seconds hold - Long Sitting Ankle Plantar Flexion with Resistance  - 1 x daily - 7 x weekly - 3 sets - 10 reps  - Long Sitting Ankle Dorsiflexion AROM  - 1 x daily - 7 x weekly - 3 sets - 10 reps - 5 seconds hold  - Supine March with Posterior Pelvic Tilt  - 1 x daily - 7 x weekly - 3 sets - 10 reps - Curl Up with Arms Crossed  - 1 x daily - 7 x weekly - 3 sets - 10 reps - Supine Double Knee to Chest  - 1 x daily - 7 x weekly - 3 sets - 10 reps - 5 seconds hold - Bent Knee Fallouts with Alternating Legs  - 1 x daily - 7 x weekly - 3 sets - 10 reps - Standing Posterior Pelvic Tilt  - 3 x daily - 7 x weekly - 3 sets - 10 reps - 5 seconds hold  - Mini Squat with Counter Support  - 1 x daily - 7 x weekly - 3 sets - 10 reps  - Standing Hip Abduction with Resistance at Ankles and Counter Support  - 1 x daily - 7 x weekly - 1-3 sets - 10 reps - 5 seconds hold   Recommended for pt to perform scapular retractions and chin tucks at home (sitting) to help continue to decrease neck stiffness. Pt demonstrated and verbalized understanding.    - Seated Toe Raise  - 1 x daily - 7 x weekly - 1-3 sets - 10 reps - 5 seconds hold    PT Short Term Goals - 08/28/22 1617       PT SHORT TERM GOAL #1   Title Pt will be independent with his initial HEP to improve strength, balance, and function.    Baseline Pt has started his HEP. (07/24/2022); pt demonstrates independence with his inital HEP (08/28/2022)    Time 3    Period Weeks    Status Achieved    Target Date 08/18/22              PT Long Term Goals - 09/20/22 1519       PT LONG TERM GOAL #1    Title Pt will improve B ankle PF, DF, EV, and IV strength by at least 1/2 MMT grade to promote ability to ambulate, as well as perform standing tasks with less difficulty and decreased fall risk.    Baseline Manually resisted ankle: PF 3+/5 R, and L;  DF 3+/5 R, 3/5 L; EV 4-/5 R, 3+/5 L; IV 3+/5 R and L (07/24/2022); Manually resisted ankle PF 4-/5 R, 3+/5 L,  DF 3+/5 R, 3+/5 L, EV 4-/5 R, 3+/5 L, IV 3+/5 R, 3+/5 L (08/28/2022); Manually resisted ankle: PF 4/5 R, 4-/5 L, DF 3+/5 R, 3+/5 L, EV 4/5 R, 4-/5L, IV 4-/5 R, 4-/5 L (09/20/2022)    Time 8    Period Weeks    Status Partially Met    Target Date 11/17/22      PT LONG TERM GOAL #2   Title Pt will improve B hip extension and abduction strength to promote ability to perform standing tasks with less difficulty and decreased fall risk.    Baseline Hip extension 3+/5 R and L; hip abduction 4/5 R and L (07/24/2022); hip extension 4/5 R and L, hip abduction 4+/5 R and L (08/28/2022); Hip extension 4/5 R, 4/5 L, hip abduction 4+/5 R, 5/5 L (09/20/2022)    Time 8    Period Weeks    Status Achieved    Target Date 09/22/22      PT LONG TERM GOAL #3   Title Pt will improve his FOTO score by at least 10 points as a demonsration of improved function.    Baseline Foot FOTO 67 (07/24/2022); 67 (09/13/2022)    Time 8    Period Weeks    Status On-going    Target Date 11/17/22      PT LONG TERM GOAL #4   Title Pt will improve his DGI score to at least 19 points as a demonstration of improved balance and decreased fall risk.    Baseline DGI 13 (07/24/2022); 16 (08/28/2022);    Time 8    Period Weeks    Status Partially Met    Target Date 11/17/22              Plan - 09/25/22 1516     Clinical Impression Statement Continued working on improving posture, trunk and B ankle strength to promote ability to ambulate with less difficulty. Pt tolerated session well without aggravation of symptoms. Pt will benefit from skilled physical therapy services to improve  strength, balance, and function.    Personal Factors and Comorbidities Comorbidity 3+;Age;Past/Current Experience;Time since onset of injury/illness/exacerbation;Profession;Fitness    Comorbidities HTN, DM, neuropathy    Examination-Activity Limitations Stand;Lift;Carry;Bend;Locomotion Level;Dressing;Squat;Transfers;Stairs    Stability/Clinical Decision Making Stable/Uncomplicated   Balance seem to be worsening based on subjective reports.   Rehab Potential Fair    PT Frequency 2x / week    PT Duration 8 weeks    PT Treatment/Interventions Therapeutic exercise;Balance training;Functional mobility training;Stair training;Therapeutic activities;Neuromuscular re-education;Patient/family education;Manual techniques;Electrical Stimulation;Gait training    PT Next Visit Plan lumbopelvic strengthening, stability and control, B hip, thigh, and ankle strengthening, balance, gait, manual techniques, modalities PRN.    PT Home Exercise Plan Medbridge Access Code: [email or web address]    Consulted and Agree with Plan of Care Patient                 Loralyn Freshwater PT, DPT  09/25/2022, 4:11 PM

## 2022-09-27 ENCOUNTER — Ambulatory Visit: Payer: Medicare Other

## 2022-09-27 DIAGNOSIS — M6281 Muscle weakness (generalized): Secondary | ICD-10-CM

## 2022-09-27 DIAGNOSIS — Z9181 History of falling: Secondary | ICD-10-CM | POA: Diagnosis not present

## 2022-09-27 DIAGNOSIS — R2681 Unsteadiness on feet: Secondary | ICD-10-CM | POA: Diagnosis not present

## 2022-09-27 DIAGNOSIS — R262 Difficulty in walking, not elsewhere classified: Secondary | ICD-10-CM | POA: Diagnosis not present

## 2022-09-27 NOTE — Therapy (Signed)
OUTPATIENT PHYSICAL THERAPY TREATMENT NOTE    Patient Name: Tyler Morgan MRN: [id number] DOB:1946-01-08, 77 y.o., male Today's Date: 09/27/2022  PCP: [name], NP  REFERRING PROVIDER: [name], NP   END OF SESSION:  PT End of Session - 09/27/22 1520     Visit Number 19    Number of Visits 33    Date for PT Re-Evaluation 11/17/22    Progress Note Due on Visit 10    PT Start Time 1520    PT Stop Time 1559    PT Time Calculation (min) 39 min    Activity Tolerance Patient tolerated treatment well    Behavior During Therapy WFL for tasks assessed/performed                              Past Medical History:  Diagnosis Date   Diabetes mellitus without complication (HCC)    ED (erectile dysfunction)    Hypertension    Motion sickness    boats   Neuropathy    Prostate enlargement    Urinary retention    Past Surgical History:  Procedure Laterality Date   BASAL CELL CARCINOMA EXCISION     nose    CATARACT EXTRACTION Bilateral 1986, 2014   COLONOSCOPY WITH PROPOFOL N/A 03/27/2018   Procedure: COLONOSCOPY WITH BIOPSIES;  Surgeon: Toney Reil, MD;  Location: [facility];  Service: Endoscopy;  Laterality: N/A;  Diabetic - oral meds   EYE SURGERY Bilateral [phone]   FRACTURE SURGERY Right    as a child/ right arm   HERNIA REPAIR Right 01-13-14   inguinal hernia   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 N/A 02/07/2017   Procedure: 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;  Surgeon: Vanna Scotland, MD;  Location: [facility];  Service: Urology;  Laterality: N/A;   POLYPECTOMY N/A 03/27/2018   Procedure: POLYPECTOMY;  Surgeon: Toney Reil, MD;  Location: [facility];  Service: Endoscopy;  Laterality: N/A;   RETINAL DETACHMENT SURGERY  Jan 2015   TONSILLECTOMY  1955   Patient Active Problem List   Diagnosis Date Noted   PVD (peripheral vascular disease) (HCC) 11/30/2021    B12 deficiency 01/29/2019   History of prostate cancer 12/13/2017   Hemorrhoids 12/13/2017   Hyperlipidemia associated with type 2 diabetes mellitus (HCC) 04/02/2017   Advanced care planning/counseling discussion 10/11/2016   Type 2 diabetes mellitus with diabetic neuropathy (HCC) 12/15/2014   Hypertension associated with diabetes (HCC)     REFERRING DIAG: E11.40 (ICD-10-CM) - Type 2 diabetes mellitus with diabetic neuropathy, without long-term current use of insulin (HCC)   THERAPY DIAG:  Unsteadiness on feet  Difficulty in walking, not elsewhere classified  Muscle weakness (generalized)  History of falling  Rationale for Evaluation and Treatment Rehabilitation  PERTINENT HISTORY: Balance, weakness. Can get around well without a cane. Standing still, pt loses balance. Has to hold onto things to stabilize himself. Has labradore thats about 77 year old and can walk his dog without a leash because the dog's pulling stabilizes him. Symptoms have been going on for a while and was told to have diabetic neuropathy. Had a fall in 2015 while working on the flooring in his den at home which caused R inguinal hernia S/P mesh placement. Pt states noticing problems in his legs since 2015. Pt also could not push up on his toes (tip toe) since then. No back pain currently but has  had a hx of back pain over the years. Decreased sensation B feet, almost not noticeable. Feels like he has a lot of weakness which makes it difficult to keep his balance. Has B LE swelling, comes and goes, not currently getting treated for it. No low back pain, just L arm and shoulder.   PRECAUTIONS: Fall risk; L5 anterolisthesis   SUBJECTIVE:   SUBJECTIVE STATEMENT: Tired and sore from digging a trench in his yard today. Has L shoulder discomfort.       PAIN:  Are you having pain? See subjective   TODAY'S TREATMENT:                                                                                                                                          DATE: 09/27/2022      Per MRI on 12/26/2016: 1. 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 There is probable chronic bilateral L5 nerve root encroachment.      No latex allergies.       Therapeutic exercise   Sitting with upright posture  Gentle manually resisted trunk extension isometrics in neutral to help decrease lumbar anterolisthesis  10x3 with 5 second holds   Stepping over 2 mini hurdles without UE assist but with PT CGA to min A   4x  Unsteadiness at times secondary to decreased ankle strength  Seated manually resisted ankle PF (concentric, isometric (about 5 second holds) and eccentric contraction)                 L 10x3                         Soleus contraction palpated                                         R 10x3                         Gastroc and solues muscle palpated.                 Seated PT assisted ankle EV to end range with eccentric manual resistance                L 10x2                R 10x2  Seated ankle circles clockwise and counter clockwise                 L 10x3 each way  R 10x3 each way   Standing B forward weight shifting onto B forefeet with heels on floor 10x2 with 10 second holds    Improved exercise technique, movement at target joints, use of target muscles after mod verbal, visual, tactile cues.         Response to treatment Pt tolerated session well without aggravation of symptoms.       Clinical impression  Continued working on improving posture, trunk and B ankle strength to promote ability to ambulate with less difficulty. Pt tolerated session well without aggravation of symptoms. Pt will benefit from skilled physical therapy services to improve strength, balance, and function.         PATIENT EDUCATION: Education details: there-ex, HEP Person  educated: Patient Education method: Explanation, Demonstration, Tactile cues, Verbal cues, and Handouts Education comprehension: verbalized understanding and returned demonstration  HOME EXERCISE PROGRAM: Access Code: [email or web address] URL: [email or web address] Date: 07/24/2022 Prepared by: Loralyn Freshwater   Exercises - Seated Transversus Abdominis Bracing  - 5 x daily - 7 x weekly - 3 sets - 10 reps - 5 seconds hold  - Supine Posterior Pelvic Tilt  - 1 x daily - 7 x weekly - 3 sets - 10 reps - 5 seconds hold - Long Sitting Ankle Plantar Flexion with Resistance  - 1 x daily - 7 x weekly - 3 sets - 10 reps  - Long Sitting Ankle Dorsiflexion AROM  - 1 x daily - 7 x weekly - 3 sets - 10 reps - 5 seconds hold  - Supine March with Posterior Pelvic Tilt  - 1 x daily - 7 x weekly - 3 sets - 10 reps - Curl Up with Arms Crossed  - 1 x daily - 7 x weekly - 3 sets - 10 reps - Supine Double Knee to Chest  - 1 x daily - 7 x weekly - 3 sets - 10 reps - 5 seconds hold - Bent Knee Fallouts with Alternating Legs  - 1 x daily - 7 x weekly - 3 sets - 10 reps - Standing Posterior Pelvic Tilt  - 3 x daily - 7 x weekly - 3 sets - 10 reps - 5 seconds hold  - Mini Squat with Counter Support  - 1 x daily - 7 x weekly - 3 sets - 10 reps  - Standing Hip Abduction with Resistance at Ankles and Counter Support  - 1 x daily - 7 x weekly - 1-3 sets - 10 reps - 5 seconds hold   Recommended for pt to perform scapular retractions and chin tucks at home (sitting) to help continue to decrease neck stiffness. Pt demonstrated and verbalized understanding.    - Seated Toe Raise  - 1 x daily - 7 x weekly - 1-3 sets - 10 reps - 5 seconds hold    PT Short Term Goals - 08/28/22 1617       PT SHORT TERM GOAL #1   Title Pt will be independent with his initial HEP to improve strength, balance, and function.    Baseline Pt has started his HEP. (07/24/2022); pt demonstrates independence with his inital HEP (08/28/2022)     Time 3    Period Weeks    Status Achieved    Target Date 08/18/22              PT Long Term Goals - 09/20/22 1519       PT LONG TERM GOAL #1   Title Pt will improve B ankle  PF, DF, EV, and IV strength by at least 1/2 MMT grade to promote ability to ambulate, as well as perform standing tasks with less difficulty and decreased fall risk.    Baseline Manually resisted ankle: PF 3+/5 R, and L;  DF 3+/5 R, 3/5 L; EV 4-/5 R, 3+/5 L; IV 3+/5 R and L (07/24/2022); Manually resisted ankle PF 4-/5 R, 3+/5 L,  DF 3+/5 R, 3+/5 L, EV 4-/5 R, 3+/5 L, IV 3+/5 R, 3+/5 L (08/28/2022); Manually resisted ankle: PF 4/5 R, 4-/5 L, DF 3+/5 R, 3+/5 L, EV 4/5 R, 4-/5L, IV 4-/5 R, 4-/5 L (09/20/2022)    Time 8    Period Weeks    Status Partially Met    Target Date 11/17/22      PT LONG TERM GOAL #2   Title Pt will improve B hip extension and abduction strength to promote ability to perform standing tasks with less difficulty and decreased fall risk.    Baseline Hip extension 3+/5 R and L; hip abduction 4/5 R and L (07/24/2022); hip extension 4/5 R and L, hip abduction 4+/5 R and L (08/28/2022); Hip extension 4/5 R, 4/5 L, hip abduction 4+/5 R, 5/5 L (09/20/2022)    Time 8    Period Weeks    Status Achieved    Target Date 09/22/22      PT LONG TERM GOAL #3   Title Pt will improve his FOTO score by at least 10 points as a demonsration of improved function.    Baseline Foot FOTO 67 (07/24/2022); 67 (09/13/2022)    Time 8    Period Weeks    Status On-going    Target Date 11/17/22      PT LONG TERM GOAL #4   Title Pt will improve his DGI score to at least 19 points as a demonstration of improved balance and decreased fall risk.    Baseline DGI 13 (07/24/2022); 16 (08/28/2022);    Time 8    Period Weeks    Status Partially Met    Target Date 11/17/22              Plan - 09/27/22 1519     Clinical Impression Statement Continued working on improving posture, trunk and B ankle strength to promote ability to  ambulate with less difficulty. Pt tolerated session well without aggravation of symptoms. Pt will benefit from skilled physical therapy services to improve strength, balance, and function.    Personal Factors and Comorbidities Comorbidity 3+;Age;Past/Current Experience;Time since onset of injury/illness/exacerbation;Profession;Fitness    Comorbidities HTN, DM, neuropathy    Examination-Activity Limitations Stand;Lift;Carry;Bend;Locomotion Level;Dressing;Squat;Transfers;Stairs    Stability/Clinical Decision Making Stable/Uncomplicated   Balance seem to be worsening based on subjective reports.   Clinical Decision Making Low    Rehab Potential Fair    PT Frequency 2x / week    PT Duration 8 weeks    PT Treatment/Interventions Therapeutic exercise;Balance training;Functional mobility training;Stair training;Therapeutic activities;Neuromuscular re-education;Patient/family education;Manual techniques;Electrical Stimulation;Gait training    PT Next Visit Plan lumbopelvic strengthening, stability and control, B hip, thigh, and ankle strengthening, balance, gait, manual techniques, modalities PRN.    PT Home Exercise Plan Medbridge Access Code: [email or web address]    Consulted and Agree with Plan of Care Patient                 Loralyn Freshwater PT, DPT  09/27/2022, 4:05 PM

## 2022-09-29 ENCOUNTER — Encounter: Payer: Self-pay | Admitting: Nurse Practitioner

## 2022-09-29 ENCOUNTER — Ambulatory Visit (INDEPENDENT_AMBULATORY_CARE_PROVIDER_SITE_OTHER): Payer: Medicare Other | Admitting: Nurse Practitioner

## 2022-09-29 VITALS — BP 120/64 | HR 70 | Temp 97.9°F | Wt 184.6 lb

## 2022-09-29 DIAGNOSIS — E1159 Type 2 diabetes mellitus with other circulatory complications: Secondary | ICD-10-CM

## 2022-09-29 DIAGNOSIS — Z7984 Long term (current) use of oral hypoglycemic drugs: Secondary | ICD-10-CM

## 2022-09-29 DIAGNOSIS — I152 Hypertension secondary to endocrine disorders: Secondary | ICD-10-CM

## 2022-09-29 NOTE — Assessment & Plan Note (Signed)
Chronic, stable with BP much improved with low dose HCTZ.  Urine ALB 20 June 2022.  Continue current medication regimen at this time, Benazepril offering kidney protection + Hydrochlorothiazide 12.5 MG daily which has helped lower BP.  Can not take Amlodipine, although this would offer benefit.  Recommend checking BP a few mornings a week at home and documenting + focus on DASH diet.  Monitor for worsening edema and may provide PRN Lasix if presents, suspect some underlying PVD based on exam.  Recommend avoid Ibuprofen products and high salt intake.  Return in 2 months.  Labs today: BMP.

## 2022-09-29 NOTE — Progress Notes (Signed)
BP 120/64 (BP Location: Left Arm, Patient Position: Sitting, Cuff Size: Normal)   Pulse 70   Temp 97.9 F (36.6 C) (Oral)   Wt 184 lb 9.6 oz (83.7 kg)   SpO2 96%   BMI 29.81 kg/m    Subjective:    Patient ID: Tyler Morgan, male    DOB: 1945-12-12, 77 y.o.   MRN: [id number]  HPI: Tyler Morgan is a 77 y.o. male  Chief Complaint  Patient presents with   Hypertension    4 week f/up    HYPERTENSION without Chronic Kidney Disease Restarted hydrochlorothiazide at 12.5 MG on 09/01/22 due to elevations.  Was taking Benazepril only, 40 MG at the time and had elevations.  Reports tolerating medication well with no ADR.   Hypertension status: stable  Satisfied with current treatment? yes Duration of hypertension: chronic BP monitoring frequency:  rarely BP range: 110-120/60-70  BP medication side effects:  no Medication compliance: good compliance Previous BP meds: as above Aspirin: no Recurrent headaches: no Visual changes: no Palpitations: no Dyspnea: no Chest pain: no Lower extremity edema:  at baseline mild Dizzy/lightheaded: no   Relevant past medical, surgical, family and social history reviewed and updated as indicated. Interim medical history since our last visit reviewed. Allergies and medications reviewed and updated.  Review of Systems  Constitutional:  Negative for activity change, diaphoresis, fatigue and fever.  Respiratory:  Negative for cough, chest tightness, shortness of breath and wheezing.   Cardiovascular:  Negative for chest pain, palpitations and leg swelling.  Gastrointestinal: Negative.   Endocrine: Negative for cold intolerance, heat intolerance, polydipsia, polyphagia and polyuria.  Neurological: Negative.   Psychiatric/Behavioral: Negative.      Per HPI unless specifically indicated above     Objective:    BP 120/64 (BP Location: Left Arm, Patient Position: Sitting, Cuff Size: Normal)   Pulse 70   Temp 97.9 F (36.6 C) (Oral)    Wt 184 lb 9.6 oz (83.7 kg)   SpO2 96%   BMI 29.81 kg/m   Wt Readings from Last 3 Encounters:  09/29/22 184 lb 9.6 oz (83.7 kg)  09/01/22 184 lb 6.4 oz (83.6 kg)  06/02/22 189 lb 14.4 oz (86.1 kg)    Physical Exam Vitals and nursing note reviewed.  Constitutional:      General: He is awake. He is not in acute distress.    Appearance: He is well-developed and well-groomed. He is obese. He is not ill-appearing or toxic-appearing.  HENT:     Head: Normocephalic and atraumatic.     Right Ear: Hearing normal. No drainage.     Left Ear: Hearing normal. No drainage.  Eyes:     General: Lids are normal.        Right eye: No discharge.        Left eye: No discharge.     Conjunctiva/sclera: Conjunctivae normal.     Pupils: Pupils are equal, round, and reactive to light.  Neck:     Thyroid: No thyromegaly.     Vascular: No carotid bruit.  Cardiovascular:     Rate and Rhythm: Normal rate and regular rhythm.     Heart sounds: Normal heart sounds, S1 normal and S2 normal. No murmur heard.    No gallop.  Pulmonary:     Effort: Pulmonary effort is normal. No accessory muscle usage or respiratory distress.     Breath sounds: Normal breath sounds.  Abdominal:     General: Bowel sounds are normal.  Palpations: Abdomen is soft.  Musculoskeletal:        General: Normal range of motion.     Cervical back: Normal range of motion and neck supple.     Right lower leg: No edema.     Left lower leg: No edema.  Skin:    General: Skin is warm and dry.  Neurological:     Mental Status: He is alert and oriented to person, place, and time.     Deep Tendon Reflexes: Reflexes are normal and symmetric.  Psychiatric:        Attention and Perception: Attention normal.        Mood and Affect: Mood normal.        Speech: Speech normal.        Behavior: Behavior normal. Behavior is cooperative.        Thought Content: Thought content normal.     Results for orders placed or performed in visit on  09/11/22  HM DIABETES EYE EXAM  Result Value Ref Range   HM Diabetic Eye Exam No Retinopathy No Retinopathy      Assessment & Plan:   Problem List Items Addressed This Visit       Cardiovascular and Mediastinum   Hypertension associated with diabetes (HCC) - Primary    Chronic, stable with BP much improved with low dose HCTZ.  Urine ALB 20 June 2022.  Continue current medication regimen at this time, Benazepril offering kidney protection + Hydrochlorothiazide 12.5 MG daily which has helped lower BP.  Can not take Amlodipine, although this would offer benefit.  Recommend checking BP a few mornings a week at home and documenting + focus on DASH diet.  Monitor for worsening edema and may provide PRN Lasix if presents, suspect some underlying PVD based on exam.  Recommend avoid Ibuprofen products and high salt intake.  Return in 2 months.  Labs today: BMP.      Relevant Orders   Basic metabolic panel     Follow up plan: Return in about 2 months (around 11/29/2022) for T2DM, HTN/HLD.

## 2022-09-30 NOTE — Progress Notes (Signed)
Contacted via MyChart   Good morning Tyler Morgan, your labs have returned and kidney function + electrolytes remain stable.  No medication changes needed.  Any questions? Keep being awesome!!  Thank you for allowing me to participate in your care.  I appreciate you. Kindest regards, 

## 2022-10-04 ENCOUNTER — Ambulatory Visit: Payer: Medicare Other | Admitting: Physical Therapy

## 2022-10-04 DIAGNOSIS — R262 Difficulty in walking, not elsewhere classified: Secondary | ICD-10-CM | POA: Diagnosis not present

## 2022-10-04 DIAGNOSIS — Z9181 History of falling: Secondary | ICD-10-CM | POA: Diagnosis not present

## 2022-10-04 DIAGNOSIS — M6281 Muscle weakness (generalized): Secondary | ICD-10-CM | POA: Diagnosis not present

## 2022-10-04 DIAGNOSIS — R2681 Unsteadiness on feet: Secondary | ICD-10-CM | POA: Diagnosis not present

## 2022-10-04 NOTE — Therapy (Addendum)
OUTPATIENT PHYSICAL THERAPY PROGRESS NOTE    Patient Name: Tyler Morgan MRN: [id number] DOB:23-Mar-1945, 77 y.o., male Today's Date: 10/04/2022  PCP: [name], NP  REFERRING PROVIDER: [name], NP   END OF SESSION:  PT End of Session - 10/04/22 1349     Visit Number 20    Number of Visits 33    Date for PT Re-Evaluation 11/17/22    Authorization - Visit Number 20    Authorization - Number of Visits 33    Progress Note Due on Visit 20    PT Start Time 1350    PT Stop Time 1430    PT Time Calculation (min) 40 min    Activity Tolerance Patient tolerated treatment well    Behavior During Therapy WFL for tasks assessed/performed                    Past Medical History:  Diagnosis Date   Diabetes mellitus without complication (HCC)    ED (erectile dysfunction)    Hypertension    Motion sickness    boats   Neuropathy    Prostate enlargement    Urinary retention    Past Surgical History:  Procedure Laterality Date   BASAL CELL CARCINOMA EXCISION     nose    CATARACT EXTRACTION Bilateral 1986, 2014   COLONOSCOPY WITH PROPOFOL N/A 03/27/2018   Procedure: COLONOSCOPY WITH BIOPSIES;  Surgeon: Toney Reil, MD;  Location: [facility];  Service: Endoscopy;  Laterality: N/A;  Diabetic - oral meds   EYE SURGERY Bilateral [phone]   FRACTURE SURGERY Right    as a child/ right arm   HERNIA REPAIR Right 01-13-14   inguinal hernia   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 N/A 02/07/2017   Procedure: 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;  Surgeon: Vanna Scotland, MD;  Location: [facility];  Service: Urology;  Laterality: N/A;   POLYPECTOMY N/A 03/27/2018   Procedure: POLYPECTOMY;  Surgeon: Toney Reil, MD;  Location: [facility];  Service: Endoscopy;  Laterality: N/A;   RETINAL DETACHMENT SURGERY  Jan 2015   TONSILLECTOMY  1955   Patient Active Problem List   Diagnosis Date  Noted   PVD (peripheral vascular disease) (HCC) 11/30/2021   B12 deficiency 01/29/2019   History of prostate cancer 12/13/2017   Hemorrhoids 12/13/2017   Hyperlipidemia associated with type 2 diabetes mellitus (HCC) 04/02/2017   Advanced care planning/counseling discussion 10/11/2016   Type 2 diabetes mellitus with diabetic neuropathy (HCC) 12/15/2014   Hypertension associated with diabetes (HCC)     REFERRING DIAG: E11.40 (ICD-10-CM) - Type 2 diabetes mellitus with diabetic neuropathy, without long-term current use of insulin (HCC)   THERAPY DIAG:  Unsteadiness on feet  Difficulty in walking, not elsewhere classified  Muscle weakness (generalized)  History of falling  Rationale for Evaluation and Treatment Rehabilitation  PERTINENT HISTORY: Balance, weakness. Can get around well without a cane. Standing still, pt loses balance. Has to hold onto things to stabilize himself. Has labradore thats about 77 year old and can walk his dog without a leash because the dog's pulling stabilizes him. Symptoms have been going on for a while and was told to have diabetic neuropathy. Had a fall in 2015 while working on the flooring in his den at home which caused R inguinal hernia S/P mesh placement. Pt states noticing problems in his legs since 2015. Pt also could not push up on his toes (tip toe) since  then. No back pain currently but has had a hx of back pain over the years. Decreased sensation B feet, almost not noticeable. Feels like he has a lot of weakness which makes it difficult to keep his balance. Has B LE swelling, comes and goes, not currently getting treated for it. No low back pain, just L arm and shoulder.   PRECAUTIONS: Fall risk; L5 anterolisthesis   SUBJECTIVE:   SUBJECTIVE STATEMENT: Pt reports that he continues to feel better about walking but that he still needs cane because he feels that he is unable to stand on his own because of ankle weakness. Pt states that he did have lumbar  MRI in 2018 that revealed nerve impingement at L5-S1. He currently is unable to extend left toe and he feels that he reduced left ankle mobility. He also reports that he reports that he had an incident where he feel between floor shoulder joints back in 2015 and he has since not been able to do heel raises.    PAIN:  Are you having pain? See subjective   TODAY'S TREATMENT:                                                                                                                                         DATE:   10/04/22:  TM with speed at 3 mph for 5 min with BUE support  DGI: 19/24 Mild Impairment with stairs, head turns, obstacles, and gait level surface  -Lateral sway with walking tasks   Ankle MMT: -DF R/L 3+/3+ -PF R/L 4+/4+ -Inv R/L 4/4 -Ev R/L 4/4    FOTO: 69/100    09/27/2022    Per MRI on 12/26/2016: 1. 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 There is probable chronic bilateral L5 nerve root encroachment.      No latex allergies.       Therapeutic exercise   Sitting with upright posture  Gentle manually resisted trunk extension isometrics in neutral to help decrease lumbar anterolisthesis  10x3 with 5 second holds   Stepping over 2 mini hurdles without UE assist but with PT CGA to min A   4x  Unsteadiness at times secondary to decreased ankle strength  Seated manually resisted ankle PF (concentric, isometric (about 5 second holds) and eccentric contraction)                 L 10x3                         Soleus contraction palpated                                         R 10x3  Gastroc and solues muscle palpated.                 Seated PT assisted ankle EV to end range with eccentric manual resistance                L 10x2                R 10x2  Seated ankle circles clockwise and counter clockwise                 L 10x3 each way                                        R 10x3 each way   Standing B forward weight shifting onto B forefeet with heels on floor 10x2 with 10 second holds    Improved exercise technique, movement at target joints, use of target muscles after mod verbal, visual, tactile cues.         Response to treatment Pt tolerated session well without aggravation of symptoms.       Clinical impression  Pt shows improvement with dynamic balance, hip strength, and ankle strength. Pt is still having difficulty with ankle dorsiflexion which could explain foot drag when walking and difficulty with static balance. Despite improvement in dynamic balance, PT has concern with pt's lateral sway especially with head turns. Future session to determine reasons for reduced ankle mobility and whether it is joint or muscular restrictions. He will continue to benefit from ongoing skilled PT to improve left ankle mobility and dynamic balance to reduce risk of falling.   PATIENT EDUCATION: Education details: there-ex, HEP Person educated: Patient Education method: Explanation, Demonstration, Tactile cues, Verbal cues, and Handouts Education comprehension: verbalized understanding and returned demonstration  HOME EXERCISE PROGRAM: Access Code: [email or web address] URL: [email or web address] Date: 07/24/2022 Prepared by: Loralyn Freshwater   Exercises - Seated Transversus Abdominis Bracing  - 5 x daily - 7 x weekly - 3 sets - 10 reps - 5 seconds hold  - Supine Posterior Pelvic Tilt  - 1 x daily - 7 x weekly - 3 sets - 10 reps - 5 seconds hold - Long Sitting Ankle Plantar Flexion with Resistance  - 1 x daily - 7 x weekly - 3 sets - 10 reps  - Long Sitting Ankle Dorsiflexion AROM  - 1 x daily - 7 x weekly - 3 sets - 10 reps - 5 seconds hold  - Supine March with Posterior Pelvic Tilt  - 1 x daily - 7 x weekly - 3 sets - 10 reps - Curl Up with Arms Crossed  - 1 x daily - 7 x weekly - 3 sets - 10 reps - Supine Double Knee  to Chest  - 1 x daily - 7 x weekly - 3 sets - 10 reps - 5 seconds hold - Bent Knee Fallouts with Alternating Legs  - 1 x daily - 7 x weekly - 3 sets - 10 reps - Standing Posterior Pelvic Tilt  - 3 x daily - 7 x weekly - 3 sets - 10 reps - 5 seconds hold  - Mini Squat with Counter Support  - 1 x daily - 7 x weekly - 3 sets - 10 reps  - Standing Hip Abduction with Resistance at Ankles and Counter Support  - 1 x daily - 7 x weekly - 1-3 sets - 10 reps -  5 seconds hold   Recommended for pt to perform scapular retractions and chin tucks at home (sitting) to help continue to decrease neck stiffness. Pt demonstrated and verbalized understanding.    - Seated Toe Raise  - 1 x daily - 7 x weekly - 1-3 sets - 10 reps - 5 seconds hold    PT Short Term Goals - 08/28/22 1617       PT SHORT TERM GOAL #1   Title Pt will be independent with his initial HEP to improve strength, balance, and function.    Baseline Pt has started his HEP. (07/24/2022); pt demonstrates independence with his inital HEP (08/28/2022)    Time 3    Period Weeks    Status Achieved    Target Date 08/18/22              PT Long Term Goals - 09/20/22 1519       PT LONG TERM GOAL #1   Title Pt will improve B ankle PF, DF, EV, and IV strength by at least 1/2 MMT grade to promote ability to ambulate, as well as perform standing tasks with less difficulty and decreased fall risk.    Baseline Manually resisted ankle: PF 3+/5 R, and L;  DF 3+/5 R, 3/5 L; EV 4-/5 R, 3+/5 L; IV 3+/5 R and L (07/24/2022); Manually resisted ankle PF 4-/5 R, 3+/5 L,  DF 3+/5 R, 3+/5 L, EV 4-/5 R, 3+/5 L, IV 3+/5 R, 3+/5 L (08/28/2022); Manually resisted ankle: PF 4/5 R, 4-/5 L, DF 3+/5 R, 3+/5 L, EV 4/5 R, 4-/5L, IV 4-/5 R, 4-/5 L (09/20/2022)   DF 3+/3+, PF R/L 4+/4+, Inv R/L 4/4 Ev R/L 4/4    Time 8    Period Weeks    Status Partially Met    Target Date 11/17/22      PT LONG TERM GOAL #2   Title Pt will improve B hip extension and abduction strength to  promote ability to perform standing tasks with less difficulty and decreased fall risk.    Baseline Hip extension 3+/5 R and L; hip abduction 4/5 R and L (07/24/2022); hip extension 4/5 R and L, hip abduction 4+/5 R and L (08/28/2022); Hip extension 4/5 R, 4/5 L, hip abduction 4+/5 R, 5/5 L (09/20/2022) Hip Ext R/L 4+/4+, Hip Abd R/L 5/5   Time 8    Period Weeks    Status Achieved    Target Date 09/22/22      PT LONG TERM GOAL #3   Title Pt will improve his FOTO score by at least 10 points as a demonsration of improved function.    Baseline Foot FOTO 67 (07/24/2022); 67 (09/13/2022) 69 (10/04/22)   Time 8    Period Weeks    Status On-going    Target Date 11/17/22      PT LONG TERM GOAL #4   Title Pt will improve his DGI score to at least 19 points as a demonstration of improved balance and decreased fall risk.    Baseline DGI 13 (07/24/2022); 16 (08/28/2022); 19/24 (10/04/22)     Time 8    Period Weeks    Status ACHIEVED    Target Date 11/17/22                Ellin Goodie PT, DPT  [facility] [address], [place], [address] Phone: [phone]   Fax:  [phone]

## 2022-10-17 ENCOUNTER — Ambulatory Visit: Payer: Medicare Other

## 2022-10-17 DIAGNOSIS — R262 Difficulty in walking, not elsewhere classified: Secondary | ICD-10-CM

## 2022-10-17 DIAGNOSIS — R2681 Unsteadiness on feet: Secondary | ICD-10-CM | POA: Diagnosis not present

## 2022-10-17 DIAGNOSIS — Z9181 History of falling: Secondary | ICD-10-CM | POA: Diagnosis not present

## 2022-10-17 DIAGNOSIS — M6281 Muscle weakness (generalized): Secondary | ICD-10-CM | POA: Diagnosis not present

## 2022-10-17 NOTE — Therapy (Signed)
OUTPATIENT PHYSICAL THERAPY TREATMENT NOTE    Patient Name: Tyler Morgan MRN: [id number] DOB:06-Nov-1945, 77 y.o., male Today's Date: 10/17/2022  PCP: [name], NP  REFERRING PROVIDER: [name], NP   END OF SESSION:  PT End of Session - 10/17/22 0816     Visit Number 21    Number of Visits 33    Date for PT Re-Evaluation 11/17/22    Authorization - Number of Visits 33    Progress Note Due on Visit 21    PT Start Time 0816    PT Stop Time 0903    PT Time Calculation (min) 47 min    Activity Tolerance Patient tolerated treatment well    Behavior During Therapy [facility] for tasks assessed/performed                               Past Medical History:  Diagnosis Date   Diabetes mellitus without complication (HCC)    ED (erectile dysfunction)    Hypertension    Motion sickness    boats   Neuropathy    Prostate enlargement    Urinary retention    Past Surgical History:  Procedure Laterality Date   BASAL CELL CARCINOMA EXCISION     nose    CATARACT EXTRACTION Bilateral 1986, 2014   COLONOSCOPY WITH PROPOFOL N/A 03/27/2018   Procedure: COLONOSCOPY WITH BIOPSIES;  Surgeon: Toney Reil, MD;  Location: [facility];  Service: Endoscopy;  Laterality: N/A;  Diabetic - oral meds   EYE SURGERY Bilateral [phone]   FRACTURE SURGERY Right    as a child/ right arm   HERNIA REPAIR Right 01-13-14   inguinal hernia   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 N/A 02/07/2017   Procedure: 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;  Surgeon: Vanna Scotland, MD;  Location: [facility];  Service: Urology;  Laterality: N/A;   POLYPECTOMY N/A 03/27/2018   Procedure: POLYPECTOMY;  Surgeon: Toney Reil, MD;  Location: [facility];  Service: Endoscopy;  Laterality: N/A;   RETINAL DETACHMENT SURGERY  Jan 2015   TONSILLECTOMY  1955   Patient Active Problem List   Diagnosis Date Noted   PVD  (peripheral vascular disease) (HCC) 11/30/2021   B12 deficiency 01/29/2019   History of prostate cancer 12/13/2017   Hemorrhoids 12/13/2017   Hyperlipidemia associated with type 2 diabetes mellitus (HCC) 04/02/2017   Advanced care planning/counseling discussion 10/11/2016   Type 2 diabetes mellitus with diabetic neuropathy (HCC) 12/15/2014   Hypertension associated with diabetes (HCC)     REFERRING DIAG: E11.40 (ICD-10-CM) - Type 2 diabetes mellitus with diabetic neuropathy, without long-term current use of insulin (HCC)   THERAPY DIAG:  Unsteadiness on feet  Difficulty in walking, not elsewhere classified  Muscle weakness (generalized)  History of falling  Rationale for Evaluation and Treatment Rehabilitation  PERTINENT HISTORY: Balance, weakness. Can get around well without a cane. Standing still, pt loses balance. Has to hold onto things to stabilize himself. Has labradore thats about 77 year old and can walk his dog without a leash because the dog's pulling stabilizes him. Symptoms have been going on for a while and was told to have diabetic neuropathy. Had a fall in 2015 while working on the flooring in his den at home which caused R inguinal hernia S/P mesh placement. Pt states noticing problems in his legs since 2015. Pt also could not push up on his toes (  tip toe) since then. No back pain currently but has had a hx of back pain over the years. Decreased sensation B feet, almost not noticeable. Feels like he has a lot of weakness which makes it difficult to keep his balance. Has B LE swelling, comes and goes, not currently getting treated for it. No low back pain, just L arm and shoulder.   PRECAUTIONS: Fall risk; L5 anterolisthesis   SUBJECTIVE:   SUBJECTIVE STATEMENT: Doing ok so far, no falls. The L upper trap pain area is almost gone.       PAIN:  Are you having pain? See subjective   TODAY'S TREATMENT:                                                                                                                                          DATE: 10/17/2022      Per MRI on 12/26/2016: 1. 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 There is probable chronic bilateral L5 nerve root encroachment.      No latex allergies.       Therapeutic exercise   Sitting with upright posture  Gentle manually resisted trunk extension isometrics in neutral to help decrease lumbar anterolisthesis  10x3 with 5 second holds  Seated trunk flexion isometrics manually resisted 5x5 seconds  Seated manually resisted ankle PF (concentric, isometric (about 5 second holds) and eccentric contraction)                 L 10x3                         Soleus contraction palpated                                         R 10x3                         Gastroc and solues muscle palpated  .                 Seated PT assisted ankle EV to end range with eccentric manual resistance                L 10x2                R 10x2  Seated ankle EV yellow band resisted exercise   L 10x2  R 10x2    Seated L ankle PF green band 2x      Improved exercise technique, movement at target joints, use of target muscles after mod verbal, visual, tactile cues.         Response to treatment Pt tolerated session well without aggravation of symptoms.  Clinical impression  Continued working on improving posture, trunk and B ankle strength to promote ability to ambulate with less difficulty. Pt tolerated session well without aggravation of symptoms. Pt will benefit from skilled physical therapy services to improve strength, balance, and function.         PATIENT EDUCATION: Education details: there-ex, HEP Person educated: Patient Education method: Explanation, Demonstration, Tactile cues, Verbal cues, and Handouts Education comprehension: verbalized understanding and returned  demonstration  HOME EXERCISE PROGRAM: Access Code: [email or web address] URL: [email or web address] Date: 07/24/2022 Prepared by: Loralyn Freshwater   Exercises - Seated Transversus Abdominis Bracing  - 5 x daily - 7 x weekly - 3 sets - 10 reps - 5 seconds hold  - Supine Posterior Pelvic Tilt  - 1 x daily - 7 x weekly - 3 sets - 10 reps - 5 seconds hold - Long Sitting Ankle Plantar Flexion with Resistance  - 1 x daily - 7 x weekly - 3 sets - 10 reps  Upgraded to green band on 10/17/2022   - Long Sitting Ankle Dorsiflexion AROM  - 1 x daily - 7 x weekly - 3 sets - 10 reps - 5 seconds hold  - Supine March with Posterior Pelvic Tilt  - 1 x daily - 7 x weekly - 3 sets - 10 reps - Curl Up with Arms Crossed  - 1 x daily - 7 x weekly - 3 sets - 10 reps - Supine Double Knee to Chest  - 1 x daily - 7 x weekly - 3 sets - 10 reps - 5 seconds hold - Bent Knee Fallouts with Alternating Legs  - 1 x daily - 7 x weekly - 3 sets - 10 reps - Standing Posterior Pelvic Tilt  - 3 x daily - 7 x weekly - 3 sets - 10 reps - 5 seconds hold  - Mini Squat with Counter Support  - 1 x daily - 7 x weekly - 3 sets - 10 reps  - Standing Hip Abduction with Resistance at Ankles and Counter Support  - 1 x daily - 7 x weekly - 1-3 sets - 10 reps - 5 seconds hold   Recommended for pt to perform scapular retractions and chin tucks at home (sitting) to help continue to decrease neck stiffness. Pt demonstrated and verbalized understanding.    - Seated Toe Raise  - 1 x daily - 7 x weekly - 1-3 sets - 10 reps - 5 seconds hold  - Long Sitting Ankle Eversion with Resistance  - 1 x daily - 7 x weekly - 2-3 sets - 10 reps  Yellow band  PT Short Term Goals - 08/28/22 1617       PT SHORT TERM GOAL #1   Title Pt will be independent with his initial HEP to improve strength, balance, and function.    Baseline Pt has started his HEP. (07/24/2022); pt demonstrates independence with his inital HEP (08/28/2022)    Time 3    Period Weeks     Status Achieved    Target Date 08/18/22              PT Long Term Goals - 09/20/22 1519       PT LONG TERM GOAL #1   Title Pt will improve B ankle PF, DF, EV, and IV strength by at least 1/2 MMT grade to promote ability to ambulate, as well as perform standing tasks with less difficulty and decreased fall risk.    Baseline Manually resisted  ankle: PF 3+/5 R, and L;  DF 3+/5 R, 3/5 L; EV 4-/5 R, 3+/5 L; IV 3+/5 R and L (07/24/2022); Manually resisted ankle PF 4-/5 R, 3+/5 L,  DF 3+/5 R, 3+/5 L, EV 4-/5 R, 3+/5 L, IV 3+/5 R, 3+/5 L (08/28/2022); Manually resisted ankle: PF 4/5 R, 4-/5 L, DF 3+/5 R, 3+/5 L, EV 4/5 R, 4-/5L, IV 4-/5 R, 4-/5 L (09/20/2022)    Time 8    Period Weeks    Status Partially Met    Target Date 11/17/22      PT LONG TERM GOAL #2   Title Pt will improve B hip extension and abduction strength to promote ability to perform standing tasks with less difficulty and decreased fall risk.    Baseline Hip extension 3+/5 R and L; hip abduction 4/5 R and L (07/24/2022); hip extension 4/5 R and L, hip abduction 4+/5 R and L (08/28/2022); Hip extension 4/5 R, 4/5 L, hip abduction 4+/5 R, 5/5 L (09/20/2022)    Time 8    Period Weeks    Status Achieved    Target Date 09/22/22      PT LONG TERM GOAL #3   Title Pt will improve his FOTO score by at least 10 points as a demonsration of improved function.    Baseline Foot FOTO 67 (07/24/2022); 67 (09/13/2022)    Time 8    Period Weeks    Status On-going    Target Date 11/17/22      PT LONG TERM GOAL #4   Title Pt will improve his DGI score to at least 19 points as a demonstration of improved balance and decreased fall risk.    Baseline DGI 13 (07/24/2022); 16 (08/28/2022);    Time 8    Period Weeks    Status Partially Met    Target Date 11/17/22              Plan - 10/17/22 0814     Clinical Impression Statement Continued working on improving posture, trunk and B ankle strength to promote ability to ambulate with less  difficulty. Pt tolerated session well without aggravation of symptoms. Pt will benefit from skilled physical therapy services to improve strength, balance, and function.    Personal Factors and Comorbidities Comorbidity 3+;Age;Past/Current Experience;Time since onset of injury/illness/exacerbation;Profession;Fitness    Comorbidities HTN, DM, neuropathy    Examination-Activity Limitations Stand;Lift;Carry;Bend;Locomotion Level;Dressing;Squat;Transfers;Stairs    Stability/Clinical Decision Making Stable/Uncomplicated   Balance seem to be worsening based on subjective reports.   Rehab Potential Fair    PT Frequency 2x / week    PT Duration 8 weeks    PT Treatment/Interventions Therapeutic exercise;Balance training;Functional mobility training;Stair training;Therapeutic activities;Neuromuscular re-education;Patient/family education;Manual techniques;Electrical Stimulation;Gait training    PT Next Visit Plan lumbopelvic strengthening, stability and control, B hip, thigh, and ankle strengthening, balance, gait, manual techniques, modalities PRN.    PT Home Exercise Plan Medbridge Access Code: [email or web address]    Consulted and Agree with Plan of Care Patient                 Loralyn Freshwater PT, DPT  10/17/2022, 12:38 PM

## 2022-10-20 ENCOUNTER — Other Ambulatory Visit: Payer: Self-pay | Admitting: Nurse Practitioner

## 2022-10-20 NOTE — Telephone Encounter (Signed)
Requested Prescriptions  Refused Prescriptions Disp Refills   hydrochlorothiazide (HYDRODIURIL) 12.5 MG tablet [Pharmacy Med Name: HYDROCHLOROTHIAZIDE 12.5 MG TAB] 45 tablet 1    Sig: TAKE ONE TABLET BY MOUTH EVERY DAY     Cardiovascular: Diuretics - Thiazide Failed - 10/20/2022  3:06 PM      Failed - Cr in normal range and within 180 days    Creatinine  Date Value Ref Range Status  01/05/2014 0.75 0.60 - 1.30 mg/dL Final   Creatinine, Ser  Date Value Ref Range Status  09/29/2022 0.72 (L) 0.76 - 1.27 mg/dL Final         Passed - K in normal range and within 180 days    Potassium  Date Value Ref Range Status  09/29/2022 3.8 3.5 - 5.2 mmol/L Final  01/05/2014 4.0 3.5 - 5.1 mmol/L Final         Passed - Na in normal range and within 180 days    Sodium  Date Value Ref Range Status  09/29/2022 141 134 - 144 mmol/L Final  01/05/2014 140 136 - 145 mmol/L Final         Passed - Last BP in normal range    BP Readings from Last 1 Encounters:  09/29/22 120/64         Passed - Valid encounter within last 6 months    Recent Outpatient Visits           3 weeks ago Hypertension associated with diabetes (HCC)   [facility] [place], [place] T, NP   1 month ago Type 2 diabetes mellitus with diabetic neuropathy, without long-term current use of insulin (HCC)   [place] [facility] [place], [place] T, NP   4 months ago Type 2 diabetes mellitus with diabetic neuropathy, without long-term current use of insulin (HCC)   [place] [facility] [place], [place] T, NP   10 months ago Type 2 diabetes mellitus with diabetic neuropathy, without long-term current use of insulin (HCC)   [place] [facility] [place], [place] T, NP   1 year ago Type 2 diabetes mellitus with diabetic neuropathy, without long-term current use of insulin (HCC)   [place] [facility] [place], Dorie Rank, NP       Future Appointments              In 1 month [place], Dorie Rank, NP [facility]   In 2 months Vanna Scotland, MD [facility] [place]

## 2022-10-21 ENCOUNTER — Other Ambulatory Visit: Payer: Self-pay | Admitting: Nurse Practitioner

## 2022-10-24 ENCOUNTER — Ambulatory Visit: Payer: Medicare Other | Attending: Nurse Practitioner

## 2022-10-24 DIAGNOSIS — Z9181 History of falling: Secondary | ICD-10-CM | POA: Diagnosis not present

## 2022-10-24 DIAGNOSIS — M6281 Muscle weakness (generalized): Secondary | ICD-10-CM | POA: Diagnosis not present

## 2022-10-24 DIAGNOSIS — R2681 Unsteadiness on feet: Secondary | ICD-10-CM | POA: Diagnosis not present

## 2022-10-24 DIAGNOSIS — R262 Difficulty in walking, not elsewhere classified: Secondary | ICD-10-CM | POA: Insufficient documentation

## 2022-10-24 NOTE — Therapy (Signed)
OUTPATIENT PHYSICAL THERAPY TREATMENT NOTE    Patient Name: Tyler Morgan MRN: [id number] DOB:Dec 05, 1945, 77 y.o., male Today's Date: 10/24/2022  PCP: [name], NP  REFERRING PROVIDER: [name], NP   END OF SESSION:  PT End of Session - 10/24/22 0902     Visit Number 22    Number of Visits 33    Date for PT Re-Evaluation 11/17/22    Authorization - Number of Visits 33    PT Start Time 0902    PT Stop Time 0945    PT Time Calculation (min) 43 min    Activity Tolerance Patient tolerated treatment well    Behavior During Therapy WFL for tasks assessed/performed                                Past Medical History:  Diagnosis Date   Diabetes mellitus without complication (HCC)    ED (erectile dysfunction)    Hypertension    Motion sickness    boats   Neuropathy    Prostate enlargement    Urinary retention    Past Surgical History:  Procedure Laterality Date   BASAL CELL CARCINOMA EXCISION     nose    CATARACT EXTRACTION Bilateral 1986, 2014   COLONOSCOPY WITH PROPOFOL N/A 03/27/2018   Procedure: COLONOSCOPY WITH BIOPSIES;  Surgeon: Toney Reil, MD;  Location: [facility];  Service: Endoscopy;  Laterality: N/A;  Diabetic - oral meds   EYE SURGERY Bilateral [phone]   FRACTURE SURGERY Right    as a child/ right arm   HERNIA REPAIR Right 01-13-14   inguinal hernia   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 N/A 02/07/2017   Procedure: 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;  Surgeon: Vanna Scotland, MD;  Location: [facility];  Service: Urology;  Laterality: N/A;   POLYPECTOMY N/A 03/27/2018   Procedure: POLYPECTOMY;  Surgeon: Toney Reil, MD;  Location: [facility];  Service: Endoscopy;  Laterality: N/A;   RETINAL DETACHMENT SURGERY  Jan 2015   TONSILLECTOMY  1955   Patient Active Problem List   Diagnosis Date Noted   PVD (peripheral vascular disease) (HCC)  11/30/2021   B12 deficiency 01/29/2019   History of prostate cancer 12/13/2017   Hemorrhoids 12/13/2017   Hyperlipidemia associated with type 2 diabetes mellitus (HCC) 04/02/2017   Advanced care planning/counseling discussion 10/11/2016   Type 2 diabetes mellitus with diabetic neuropathy (HCC) 12/15/2014   Hypertension associated with diabetes (HCC)     REFERRING DIAG: E11.40 (ICD-10-CM) - Type 2 diabetes mellitus with diabetic neuropathy, without long-term current use of insulin (HCC)   THERAPY DIAG:  Unsteadiness on feet  Difficulty in walking, not elsewhere classified  Muscle weakness (generalized)  History of falling  Rationale for Evaluation and Treatment Rehabilitation  PERTINENT HISTORY: Balance, weakness. Can get around well without a cane. Standing still, pt loses balance. Has to hold onto things to stabilize himself. Has labradore thats about 77 year old and can walk his dog without a leash because the dog's pulling stabilizes him. Symptoms have been going on for a while and was told to have diabetic neuropathy. Had a fall in 2015 while working on the flooring in his den at home which caused R inguinal hernia S/P mesh placement. Pt states noticing problems in his legs since 2015. Pt also could not push up on his toes (tip toe) since then. No back pain currently  but has had a hx of back pain over the years. Decreased sensation B feet, almost not noticeable. Feels like he has a lot of weakness which makes it difficult to keep his balance. Has B LE swelling, comes and goes, not currently getting treated for it. No low back pain, just L arm and shoulder.   PRECAUTIONS: Fall risk; L5 anterolisthesis   SUBJECTIVE:   SUBJECTIVE STATEMENT: everything seems to be ok      PAIN:  Are you having pain? See subjective   TODAY'S TREATMENT:                                                                                                                                         DATE:  10/24/2022      Per MRI on 12/26/2016: 1. 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 There is probable chronic bilateral L5 nerve root encroachment.      No latex allergies.       Therapeutic exercise   Bent over on table   Hip extension    R 10x5 seconds     L 10x5 seconds  Sitting with upright posture  Gentle manually resisted trunk extension isometrics in neutral to help decrease lumbar anterolisthesis  10x3 with 5 second holds  Seated B heel raises  5x5 seconds for 2 sets  Seated trunk flexion stretch 30 seconds x 2   Seated B heel raises  5x5 seconds for 2 sets  Hooklying   Reverse crunch 5x2   Difficult per pt   Posterior pelvic tilt with march alternating   R 10x3   L 10x3   Crunch 10x2  Quadruped  Hip extension alternating   R 5x   L 5x  Seated manually resisted ankle PF (concentric, isometric (about 5 second holds) and eccentric contraction)                 L 10x3                         Soleus contraction palpated                                         R 10x3                         Gastroc and solues muscle palpated  . Improved exercise technique, movement at target joints, use of target muscles after mod verbal, visual, tactile cues.         Response to treatment Pt tolerated session well without aggravation of symptoms.       Clinical impression Continued working on improving posture, trunk strength, to help improve ankle muscle activation. Continued  working on ankle strength to promote ability to perform standing tasks with less difficulty. Pt tolerated session well without aggravation of symptoms. Pt will benefit from skilled physical therapy services to improve strength, balance, and function.         PATIENT EDUCATION: Education details: there-ex, HEP Person educated: Patient Education method: Explanation, Demonstration, Tactile cues,  Verbal cues, and Handouts Education comprehension: verbalized understanding and returned demonstration  HOME EXERCISE PROGRAM: Access Code: [email or web address] URL: [email or web address] Date: 07/24/2022 Prepared by: Loralyn Freshwater   Exercises - Seated Transversus Abdominis Bracing  - 5 x daily - 7 x weekly - 3 sets - 10 reps - 5 seconds hold  - Supine Posterior Pelvic Tilt  - 1 x daily - 7 x weekly - 3 sets - 10 reps - 5 seconds hold - Long Sitting Ankle Plantar Flexion with Resistance  - 1 x daily - 7 x weekly - 3 sets - 10 reps  Upgraded to green band on 10/17/2022   - Long Sitting Ankle Dorsiflexion AROM  - 1 x daily - 7 x weekly - 3 sets - 10 reps - 5 seconds hold  - Supine March with Posterior Pelvic Tilt  - 1 x daily - 7 x weekly - 3 sets - 10 reps - Curl Up with Arms Crossed  - 1 x daily - 7 x weekly - 3 sets - 10 reps - Supine Double Knee to Chest  - 1 x daily - 7 x weekly - 3 sets - 10 reps - 5 seconds hold - Bent Knee Fallouts with Alternating Legs  - 1 x daily - 7 x weekly - 3 sets - 10 reps - Standing Posterior Pelvic Tilt  - 3 x daily - 7 x weekly - 3 sets - 10 reps - 5 seconds hold  - Mini Squat with Counter Support  - 1 x daily - 7 x weekly - 3 sets - 10 reps  - Standing Hip Abduction with Resistance at Ankles and Counter Support  - 1 x daily - 7 x weekly - 1-3 sets - 10 reps - 5 seconds hold   Recommended for pt to perform scapular retractions and chin tucks at home (sitting) to help continue to decrease neck stiffness. Pt demonstrated and verbalized understanding.    - Seated Toe Raise  - 1 x daily - 7 x weekly - 1-3 sets - 10 reps - 5 seconds hold  - Long Sitting Ankle Eversion with Resistance  - 1 x daily - 7 x weekly - 2-3 sets - 10 reps  Yellow band    PT Short Term Goals - 08/28/22 1617       PT SHORT TERM GOAL #1   Title Pt will be independent with his initial HEP to improve strength, balance, and function.    Baseline Pt has started his HEP.  (07/24/2022); pt demonstrates independence with his inital HEP (08/28/2022)    Time 3    Period Weeks    Status Achieved    Target Date 08/18/22              PT Long Term Goals - 09/20/22 1519       PT LONG TERM GOAL #1   Title Pt will improve B ankle PF, DF, EV, and IV strength by at least 1/2 MMT grade to promote ability to ambulate, as well as perform standing tasks with less difficulty and decreased fall risk.    Baseline Manually resisted ankle: PF 3+/5 R, and  L;  DF 3+/5 R, 3/5 L; EV 4-/5 R, 3+/5 L; IV 3+/5 R and L (07/24/2022); Manually resisted ankle PF 4-/5 R, 3+/5 L,  DF 3+/5 R, 3+/5 L, EV 4-/5 R, 3+/5 L, IV 3+/5 R, 3+/5 L (08/28/2022); Manually resisted ankle: PF 4/5 R, 4-/5 L, DF 3+/5 R, 3+/5 L, EV 4/5 R, 4-/5L, IV 4-/5 R, 4-/5 L (09/20/2022)    Time 8    Period Weeks    Status Partially Met    Target Date 11/17/22      PT LONG TERM GOAL #2   Title Pt will improve B hip extension and abduction strength to promote ability to perform standing tasks with less difficulty and decreased fall risk.    Baseline Hip extension 3+/5 R and L; hip abduction 4/5 R and L (07/24/2022); hip extension 4/5 R and L, hip abduction 4+/5 R and L (08/28/2022); Hip extension 4/5 R, 4/5 L, hip abduction 4+/5 R, 5/5 L (09/20/2022)    Time 8    Period Weeks    Status Achieved    Target Date 09/22/22      PT LONG TERM GOAL #3   Title Pt will improve his FOTO score by at least 10 points as a demonsration of improved function.    Baseline Foot FOTO 67 (07/24/2022); 67 (09/13/2022)    Time 8    Period Weeks    Status On-going    Target Date 11/17/22      PT LONG TERM GOAL #4   Title Pt will improve his DGI score to at least 19 points as a demonstration of improved balance and decreased fall risk.    Baseline DGI 13 (07/24/2022); 16 (08/28/2022);    Time 8    Period Weeks    Status Partially Met    Target Date 11/17/22              Plan - 10/24/22 0902     Personal Factors and Comorbidities Comorbidity  3+;Age;Past/Current Experience;Time since onset of injury/illness/exacerbation;Profession;Fitness    Comorbidities HTN, DM, neuropathy    Examination-Activity Limitations Stand;Lift;Carry;Bend;Locomotion Level;Dressing;Squat;Transfers;Stairs    Stability/Clinical Decision Making Stable/Uncomplicated   Balance seem to be worsening based on subjective reports.   Rehab Potential Fair    PT Frequency 2x / week    PT Duration 8 weeks    PT Treatment/Interventions Therapeutic exercise;Balance training;Functional mobility training;Stair training;Therapeutic activities;Neuromuscular re-education;Patient/family education;Manual techniques;Electrical Stimulation;Gait training    PT Next Visit Plan lumbopelvic strengthening, stability and control, B hip, thigh, and ankle strengthening, balance, gait, manual techniques, modalities PRN.    PT Home Exercise Plan Medbridge Access Code: [email or web address]    Consulted and Agree with Plan of Care Patient                 Loralyn Freshwater PT, DPT  10/24/2022, 12:33 PM

## 2022-10-24 NOTE — Telephone Encounter (Signed)
Requested Prescriptions  Pending Prescriptions Disp Refills   hydrochlorothiazide (HYDRODIURIL) 12.5 MG tablet [Pharmacy Med Name: HYDROCHLOROTHIAZIDE 12.5 MG TAB] 45 tablet 1    Sig: TAKE ONE TABLET BY MOUTH EVERY DAY     Cardiovascular: Diuretics - Thiazide Failed - 10/21/2022 11:44 AM      Failed - Cr in normal range and within 180 days    Creatinine  Date Value Ref Range Status  01/05/2014 0.75 0.60 - 1.30 mg/dL Final   Creatinine, Ser  Date Value Ref Range Status  09/29/2022 0.72 (L) 0.76 - 1.27 mg/dL Final         Passed - K in normal range and within 180 days    Potassium  Date Value Ref Range Status  09/29/2022 3.8 3.5 - 5.2 mmol/L Final  01/05/2014 4.0 3.5 - 5.1 mmol/L Final         Passed - Na in normal range and within 180 days    Sodium  Date Value Ref Range Status  09/29/2022 141 134 - 144 mmol/L Final  01/05/2014 140 136 - 145 mmol/L Final         Passed - Last BP in normal range    BP Readings from Last 1 Encounters:  09/29/22 120/64         Passed - Valid encounter within last 6 months    Recent Outpatient Visits           3 weeks ago Hypertension associated with diabetes (HCC)   [place] [facility] [place], [place] T, NP   1 month ago Type 2 diabetes mellitus with diabetic neuropathy, without long-term current use of insulin (HCC)   [place] [facility] [place], [place] T, NP   4 months ago Type 2 diabetes mellitus with diabetic neuropathy, without long-term current use of insulin (HCC)   [place] [facility] [place], [place] T, NP   10 months ago Type 2 diabetes mellitus with diabetic neuropathy, without long-term current use of insulin (HCC)   [place] [facility] [place], [place] T, NP   1 year ago Type 2 diabetes mellitus with diabetic neuropathy, without long-term current use of insulin (HCC)   [place] [facility] [place], [place] T, NP       Future Appointments              In 1 month [place], [place] T, NP [place] [facility]   In 2 months Vanna Scotland, MD [facility] [place]             rosuvastatin (CRESTOR) 10 MG tablet [Pharmacy Med Name: ROSUVASTATIN CALCIUM 10 MG TAB] 12 tablet 0    Sig: TAKE ONE (1) TABLET BY MOUTH THREE TIMESPER WEEK     Cardiovascular:  Antilipid - Statins 2 Failed - 10/21/2022 11:44 AM      Failed - Cr in normal range and within 360 days    Creatinine  Date Value Ref Range Status  01/05/2014 0.75 0.60 - 1.30 mg/dL Final   Creatinine, Ser  Date Value Ref Range Status  09/29/2022 0.72 (L) 0.76 - 1.27 mg/dL Final         Failed - Lipid Panel in normal range within the last 12 months    Cholesterol, Total  Date Value Ref Range Status  06/02/2022 143 100 - 199 mg/dL Final   Cholesterol Piccolo, Waived  Date Value Ref Range Status  07/30/2018 131 <200 mg/dL Final    Comment:  Desirable                <200                         Borderline High      200- 239                         High                     >239    LDL Chol Calc (NIH)  Date Value Ref Range Status  06/02/2022 57 0 - 99 mg/dL Final   HDL  Date Value Ref Range Status  06/02/2022 47 >39 mg/dL Final   Triglycerides  Date Value Ref Range Status  06/02/2022 244 (H) 0 - 149 mg/dL Final   Triglycerides Piccolo,Waived  Date Value Ref Range Status  07/30/2018 121 <150 mg/dL Final    Comment:                            Normal                   <150                         Borderline High     150 - 199                         High                200 - 499                         Very High                >499          Passed - Patient is not pregnant      Passed - Valid encounter within last 12 months    Recent Outpatient Visits           3 weeks ago Hypertension associated with diabetes (HCC)   [place] [facility] [place], [place] T, NP   1 month ago Type 2  diabetes mellitus with diabetic neuropathy, without long-term current use of insulin (HCC)   [place] [facility] [place], [place] T, NP   4 months ago Type 2 diabetes mellitus with diabetic neuropathy, without long-term current use of insulin (HCC)   [place] [facility] [place], [place] T, NP   10 months ago Type 2 diabetes mellitus with diabetic neuropathy, without long-term current use of insulin (HCC)   [place] [facility] [place], [place] T, NP   1 year ago Type 2 diabetes mellitus with diabetic neuropathy, without long-term current use of insulin (HCC)   [place] [facility] [place], Dorie Rank, NP       Future Appointments             In 1 month [place], Dorie Rank, NP [place] [facility]   In 2 months Vanna Scotland, MD [facility] [place]

## 2022-10-26 ENCOUNTER — Ambulatory Visit: Payer: Medicare Other

## 2022-10-31 ENCOUNTER — Ambulatory Visit: Payer: Medicare Other

## 2022-10-31 DIAGNOSIS — M6281 Muscle weakness (generalized): Secondary | ICD-10-CM | POA: Diagnosis not present

## 2022-10-31 DIAGNOSIS — R262 Difficulty in walking, not elsewhere classified: Secondary | ICD-10-CM

## 2022-10-31 DIAGNOSIS — Z9181 History of falling: Secondary | ICD-10-CM | POA: Diagnosis not present

## 2022-10-31 DIAGNOSIS — R2681 Unsteadiness on feet: Secondary | ICD-10-CM | POA: Diagnosis not present

## 2022-10-31 NOTE — Therapy (Signed)
OUTPATIENT PHYSICAL THERAPY TREATMENT NOTE    Patient Name: Tyler Morgan MRN: [id number] DOB:1945-06-20, 77 y.o., male Today's Date: 10/31/2022  PCP: [name], NP  REFERRING PROVIDER: [name], NP   END OF SESSION:  PT End of Session - 10/31/22 0821     Visit Number 23    Number of Visits 33    Date for PT Re-Evaluation 11/17/22    Authorization - Number of Visits 33    PT Start Time 0821    PT Stop Time 0901    PT Time Calculation (min) 40 min    Activity Tolerance Patient tolerated treatment well    Behavior During Therapy [facility] for tasks assessed/performed                                 Past Medical History:  Diagnosis Date   Diabetes mellitus without complication (HCC)    ED (erectile dysfunction)    Hypertension    Motion sickness    boats   Neuropathy    Prostate enlargement    Urinary retention    Past Surgical History:  Procedure Laterality Date   BASAL CELL CARCINOMA EXCISION     nose    CATARACT EXTRACTION Bilateral 1986, 2014   COLONOSCOPY WITH PROPOFOL N/A 03/27/2018   Procedure: COLONOSCOPY WITH BIOPSIES;  Surgeon: Toney Reil, MD;  Location: [facility];  Service: Endoscopy;  Laterality: N/A;  Diabetic - oral meds   EYE SURGERY Bilateral [phone]   FRACTURE SURGERY Right    as a child/ right arm   HERNIA REPAIR Right 01-13-14   inguinal hernia   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 N/A 02/07/2017   Procedure: 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;  Surgeon: Vanna Scotland, MD;  Location: [facility];  Service: Urology;  Laterality: N/A;   POLYPECTOMY N/A 03/27/2018   Procedure: POLYPECTOMY;  Surgeon: Toney Reil, MD;  Location: [facility];  Service: Endoscopy;  Laterality: N/A;   RETINAL DETACHMENT SURGERY  Jan 2015   TONSILLECTOMY  1955   Patient Active Problem List   Diagnosis Date Noted   PVD (peripheral vascular disease)  (HCC) 11/30/2021   B12 deficiency 01/29/2019   History of prostate cancer 12/13/2017   Hemorrhoids 12/13/2017   Hyperlipidemia associated with type 2 diabetes mellitus (HCC) 04/02/2017   Advanced care planning/counseling discussion 10/11/2016   Type 2 diabetes mellitus with diabetic neuropathy (HCC) 12/15/2014   Hypertension associated with diabetes (HCC)     REFERRING DIAG: E11.40 (ICD-10-CM) - Type 2 diabetes mellitus with diabetic neuropathy, without long-term current use of insulin (HCC)   THERAPY DIAG:  Unsteadiness on feet  Difficulty in walking, not elsewhere classified  Muscle weakness (generalized)  History of falling  Rationale for Evaluation and Treatment Rehabilitation  PERTINENT HISTORY: Balance, weakness. Can get around well without a cane. Standing still, pt loses balance. Has to hold onto things to stabilize himself. Has labradore thats about 77 year old and can walk his dog without a leash because the dog's pulling stabilizes him. Symptoms have been going on for a while and was told to have diabetic neuropathy. Had a fall in 2015 while working on the flooring in his den at home which caused R inguinal hernia S/P mesh placement. Pt states noticing problems in his legs since 2015. Pt also could not push up on his toes (tip toe) since then. No back pain  currently but has had a hx of back pain over the years. Decreased sensation B feet, almost not noticeable. Feels like he has a lot of weakness which makes it difficult to keep his balance. Has B LE swelling, comes and goes, not currently getting treated for it. No low back pain, just L arm and shoulder.   PRECAUTIONS: Fall risk; L5 anterolisthesis   SUBJECTIVE:   SUBJECTIVE STATEMENT: Walking and legs are doing pretty good. Feels like PT is helping. Has been having scheduling problems. Wants to cut down to one time a week.       PAIN:  Are you having pain? See subjective   TODAY'S TREATMENT:                                                                                                                                          DATE: 10/31/2022      Per MRI on 12/26/2016: 1. 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 There is probable chronic bilateral L5 nerve root encroachment.      No latex allergies.       Therapeutic exercise    Sitting with upright posture  Gentle manually resisted trunk extension isometrics in neutral to help decrease lumbar anterolisthesis  10x2 with 10 second holds  Seated B heel raises  5x5 seconds for 2 sets   Then with folded towel under heel to promote more strength in more B ankle PF position    5x5 seconds for 2 sets  Bent over on table   Hip extension    R 10x5 seconds     L 10x5 seconds  Seated trunk flexion stretch 30 seconds x 2   Hooklying   Reverse crunch 6x   Difficult per pt   Posterior pelvic tilt with march alternating   R 10x then 10x5 seconds for 2 sets   L 10x then 10x5 seconds for 2 sets   Crunch 10x2   Static standing   Eyes open 30 seconds with light touch assist PRN    Eyes closed 30 seconds with light touch assist PRN for 3 sets    Standing with B UE assiust   SLS    L LE 30 seconds      Improved exercise technique, movement at target joints, use of target muscles after mod verbal, visual, tactile cues.         Response to treatment Pt tolerated session well without aggravation of symptoms.       Clinical impression Continued working on improving posture, trunk and glute strength, to help improve ankle muscle activation. Continued working on ankle strength to promote ability to perform standing tasks with less difficulty. Pt tolerated session well without aggravation of symptoms. Pt will benefit from skilled physical therapy services to improve strength, balance, and function.  PATIENT EDUCATION: Education  details: there-ex, HEP Person educated: Patient Education method: Explanation, Demonstration, Tactile cues, Verbal cues, and Handouts Education comprehension: verbalized understanding and returned demonstration  HOME EXERCISE PROGRAM: Access Code: [email or web address] URL: [email or web address] Date: 07/24/2022 Prepared by: Loralyn Freshwater   Exercises - Seated Transversus Abdominis Bracing  - 5 x daily - 7 x weekly - 3 sets - 10 reps - 5 seconds hold  - Supine Posterior Pelvic Tilt  - 1 x daily - 7 x weekly - 3 sets - 10 reps - 5 seconds hold - Long Sitting Ankle Plantar Flexion with Resistance  - 1 x daily - 7 x weekly - 3 sets - 10 reps  Upgraded to green band on 10/17/2022     - Long Sitting Ankle Dorsiflexion AROM  - 1 x daily - 7 x weekly - 3 sets - 10 reps - 5 seconds hold  - Supine March with Posterior Pelvic Tilt  - 1 x daily - 7 x weekly - 3 sets - 10 reps - Curl Up with Arms Crossed  - 1 x daily - 7 x weekly - 3 sets - 10 reps - Supine Double Knee to Chest  - 1 x daily - 7 x weekly - 3 sets - 10 reps - 5 seconds hold - Bent Knee Fallouts with Alternating Legs  - 1 x daily - 7 x weekly - 3 sets - 10 reps - Standing Posterior Pelvic Tilt  - 3 x daily - 7 x weekly - 3 sets - 10 reps - 5 seconds hold  - Mini Squat with Counter Support  - 1 x daily - 7 x weekly - 3 sets - 10 reps  - Standing Hip Abduction with Resistance at Ankles and Counter Support  - 1 x daily - 7 x weekly - 1-3 sets - 10 reps - 5 seconds hold   Recommended for pt to perform scapular retractions and chin tucks at home (sitting) to help continue to decrease neck stiffness. Pt demonstrated and verbalized understanding.    - Seated Toe Raise  - 1 x daily - 7 x weekly - 1-3 sets - 10 reps - 5 seconds hold Then with folded towel under heel to promote more strength in more B ankle PF position (added on 10/30/2021)  - Long Sitting Ankle Eversion with Resistance  - 1 x daily - 7 x weekly - 2-3 sets - 10  reps  Yellow band     Seated B heel raises  5x5 seconds for 2 sets   Then with folded towel under heel to promote more strength in more B ankle PF position    5x5 seconds for 2 sets   PT Short Term Goals - 08/28/22 1617       PT SHORT TERM GOAL #1   Title Pt will be independent with his initial HEP to improve strength, balance, and function.    Baseline Pt has started his HEP. (07/24/2022); pt demonstrates independence with his inital HEP (08/28/2022)    Time 3    Period Weeks    Status Achieved    Target Date 08/18/22              PT Long Term Goals - 09/20/22 1519       PT LONG TERM GOAL #1   Title Pt will improve B ankle PF, DF, EV, and IV strength by at least 1/2 MMT grade to promote ability to ambulate, as well as perform standing tasks with less  difficulty and decreased fall risk.    Baseline Manually resisted ankle: PF 3+/5 R, and L;  DF 3+/5 R, 3/5 L; EV 4-/5 R, 3+/5 L; IV 3+/5 R and L (07/24/2022); Manually resisted ankle PF 4-/5 R, 3+/5 L,  DF 3+/5 R, 3+/5 L, EV 4-/5 R, 3+/5 L, IV 3+/5 R, 3+/5 L (08/28/2022); Manually resisted ankle: PF 4/5 R, 4-/5 L, DF 3+/5 R, 3+/5 L, EV 4/5 R, 4-/5L, IV 4-/5 R, 4-/5 L (09/20/2022)    Time 8    Period Weeks    Status Partially Met    Target Date 11/17/22      PT LONG TERM GOAL #2   Title Pt will improve B hip extension and abduction strength to promote ability to perform standing tasks with less difficulty and decreased fall risk.    Baseline Hip extension 3+/5 R and L; hip abduction 4/5 R and L (07/24/2022); hip extension 4/5 R and L, hip abduction 4+/5 R and L (08/28/2022); Hip extension 4/5 R, 4/5 L, hip abduction 4+/5 R, 5/5 L (09/20/2022)    Time 8    Period Weeks    Status Achieved    Target Date 09/22/22      PT LONG TERM GOAL #3   Title Pt will improve his FOTO score by at least 10 points as a demonsration of improved function.    Baseline Foot FOTO 67 (07/24/2022); 67 (09/13/2022)    Time 8    Period Weeks    Status On-going     Target Date 11/17/22      PT LONG TERM GOAL #4   Title Pt will improve his DGI score to at least 19 points as a demonstration of improved balance and decreased fall risk.    Baseline DGI 13 (07/24/2022); 16 (08/28/2022);    Time 8    Period Weeks    Status Partially Met    Target Date 11/17/22              Plan - 10/31/22 1056     Clinical Impression Statement Continued working on improving posture, trunk and glute strength, to help improve ankle muscle activation. Continued working on ankle strength to promote ability to perform standing tasks with less difficulty. Pt tolerated session well without aggravation of symptoms. Pt will benefit from skilled physical therapy services to improve strength, balance, and function.    Personal Factors and Comorbidities Comorbidity 3+;Age;Past/Current Experience;Time since onset of injury/illness/exacerbation;Profession;Fitness    Comorbidities HTN, DM, neuropathy    Examination-Activity Limitations Stand;Lift;Carry;Bend;Locomotion Level;Dressing;Squat;Transfers;Stairs    Stability/Clinical Decision Making Stable/Uncomplicated   Balance seem to be worsening based on subjective reports.   Rehab Potential Fair    PT Frequency 2x / week    PT Duration 8 weeks    PT Treatment/Interventions Therapeutic exercise;Balance training;Functional mobility training;Stair training;Therapeutic activities;Neuromuscular re-education;Patient/family education;Manual techniques;Electrical Stimulation;Gait training    PT Next Visit Plan lumbopelvic strengthening, stability and control, B hip, thigh, and ankle strengthening, balance, gait, manual techniques, modalities PRN.    PT Home Exercise Plan Medbridge Access Code: [email or web address]    Consulted and Agree with Plan of Care Patient                  Loralyn Freshwater PT, DPT  10/31/2022, 10:59 AM

## 2022-11-02 ENCOUNTER — Ambulatory Visit: Payer: Medicare Other

## 2022-11-07 ENCOUNTER — Ambulatory Visit: Payer: Medicare Other

## 2022-11-07 DIAGNOSIS — R262 Difficulty in walking, not elsewhere classified: Secondary | ICD-10-CM | POA: Diagnosis not present

## 2022-11-07 DIAGNOSIS — M6281 Muscle weakness (generalized): Secondary | ICD-10-CM

## 2022-11-07 DIAGNOSIS — R2681 Unsteadiness on feet: Secondary | ICD-10-CM

## 2022-11-07 DIAGNOSIS — Z9181 History of falling: Secondary | ICD-10-CM

## 2022-11-07 NOTE — Therapy (Signed)
OUTPATIENT PHYSICAL THERAPY TREATMENT NOTE    Patient Name: Tyler Morgan MRN: [id number] DOB:23-Jan-1946, 77 y.o., male Today's Date: 11/07/2022  PCP: [name], NP  REFERRING PROVIDER: [name], NP   END OF SESSION:  PT End of Session - 11/07/22 0819     Visit Number 23    Number of Visits 33    Date for PT Re-Evaluation 11/17/22    Authorization - Number of Visits 33    PT Start Time 0819    PT Stop Time 0858    PT Time Calculation (min) 39 min    Activity Tolerance Patient tolerated treatment well    Behavior During Therapy [facility] for tasks assessed/performed                                  Past Medical History:  Diagnosis Date   Diabetes mellitus without complication (HCC)    ED (erectile dysfunction)    Hypertension    Motion sickness    boats   Neuropathy    Prostate enlargement    Urinary retention    Past Surgical History:  Procedure Laterality Date   BASAL CELL CARCINOMA EXCISION     nose    CATARACT EXTRACTION Bilateral 1986, 2014   COLONOSCOPY WITH PROPOFOL N/A 03/27/2018   Procedure: COLONOSCOPY WITH BIOPSIES;  Surgeon: Toney Reil, MD;  Location: [facility];  Service: Endoscopy;  Laterality: N/A;  Diabetic - oral meds   EYE SURGERY Bilateral [phone]   FRACTURE SURGERY Right    as a child/ right arm   HERNIA REPAIR Right 01-13-14   inguinal hernia   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 N/A 02/07/2017   Procedure: 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;  Surgeon: Vanna Scotland, MD;  Location: [facility];  Service: Urology;  Laterality: N/A;   POLYPECTOMY N/A 03/27/2018   Procedure: POLYPECTOMY;  Surgeon: Toney Reil, MD;  Location: [facility];  Service: Endoscopy;  Laterality: N/A;   RETINAL DETACHMENT SURGERY  Jan 2015   TONSILLECTOMY  1955   Patient Active Problem List   Diagnosis Date Noted   PVD (peripheral vascular disease)  (HCC) 11/30/2021   B12 deficiency 01/29/2019   History of prostate cancer 12/13/2017   Hemorrhoids 12/13/2017   Hyperlipidemia associated with type 2 diabetes mellitus (HCC) 04/02/2017   Advanced care planning/counseling discussion 10/11/2016   Type 2 diabetes mellitus with diabetic neuropathy (HCC) 12/15/2014   Hypertension associated with diabetes (HCC)     REFERRING DIAG: E11.40 (ICD-10-CM) - Type 2 diabetes mellitus with diabetic neuropathy, without long-term current use of insulin (HCC)   THERAPY DIAG:  Unsteadiness on feet  Difficulty in walking, not elsewhere classified  Muscle weakness (generalized)  History of falling  Rationale for Evaluation and Treatment Rehabilitation  PERTINENT HISTORY: Balance, weakness. Can get around well without a cane. Standing still, pt loses balance. Has to hold onto things to stabilize himself. Has labradore thats about 77 year old and can walk his dog without a leash because the dog's pulling stabilizes him. Symptoms have been going on for a while and was told to have diabetic neuropathy. Had a fall in 2015 while working on the flooring in his den at home which caused R inguinal hernia S/P mesh placement. Pt states noticing problems in his legs since 2015. Pt also could not push up on his toes (tip toe) since then. No back  pain currently but has had a hx of back pain over the years. Decreased sensation B feet, almost not noticeable. Feels like he has a lot of weakness which makes it difficult to keep his balance. Has B LE swelling, comes and goes, not currently getting treated for it. No low back pain, just L arm and shoulder.   PRECAUTIONS: Fall risk; L5 anterolisthesis   SUBJECTIVE:   SUBJECTIVE STATEMENT: Walking and legs are doing pretty good. No pain currently.       PAIN:  Are you having pain? See subjective   TODAY'S TREATMENT:                                                                                                                                          DATE: 11/07/2022      Per MRI on 12/26/2016: 1. 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 There is probable chronic bilateral L5 nerve root encroachment.      No latex allergies.       Therapeutic exercise    Sitting with upright posture  Gentle manually resisted trunk extension isometrics in neutral to help decrease lumbar anterolisthesis  10x3 with 5 second holds   Seated trunk flexion stretch 30 seconds x 3   Standing B low rows red band with posterior pelvic tilt 10x5 seconds for 3 sets   Bent over on table   Hip extension    R 10x5 seconds     L 10x5 seconds  Static standing mini lunge with B UE assist   R 10x  L 10x    Then with contralateral UE assist    R 10x, then 10x5 seconds   L 10x, then 10x5 seconds    Seated B heel raises   with folded towel under heel to promote more strength in more B ankle PF position    5x5 seconds for 4 sets    Static standing    Eyes closed 30 seconds with light touch assist PRN for 3 sets      Improved exercise technique, movement at target joints, use of target muscles after mod verbal, visual, tactile cues.         Response to treatment Pt tolerated session well without aggravation of symptoms.       Clinical impression  Continued working on improving posture, trunk and glute strength, to help improve ankle muscle activation and strength for standing tasks. Good muscle use observed with exercises. Pt tolerated session well without aggravation of symptoms. Pt will benefit from skilled physical therapy services to improve strength, balance, and function.         PATIENT EDUCATION: Education details: there-ex, HEP Person educated: Patient Education method: Explanation, Demonstration, Tactile cues, Verbal cues, and Handouts Education comprehension: verbalized understanding and  returned demonstration  HOME EXERCISE PROGRAM: Access Code: [email or web address] URL:  [email or web address] Date: 07/24/2022 Prepared by: Loralyn Freshwater   Exercises - Seated Transversus Abdominis Bracing  - 5 x daily - 7 x weekly - 3 sets - 10 reps - 5 seconds hold  - Supine Posterior Pelvic Tilt  - 1 x daily - 7 x weekly - 3 sets - 10 reps - 5 seconds hold - Long Sitting Ankle Plantar Flexion with Resistance  - 1 x daily - 7 x weekly - 3 sets - 10 reps  Upgraded to green band on 10/17/2022     - Long Sitting Ankle Dorsiflexion AROM  - 1 x daily - 7 x weekly - 3 sets - 10 reps - 5 seconds hold  - Supine March with Posterior Pelvic Tilt  - 1 x daily - 7 x weekly - 3 sets - 10 reps - Curl Up with Arms Crossed  - 1 x daily - 7 x weekly - 3 sets - 10 reps - Supine Double Knee to Chest  - 1 x daily - 7 x weekly - 3 sets - 10 reps - 5 seconds hold - Bent Knee Fallouts with Alternating Legs  - 1 x daily - 7 x weekly - 3 sets - 10 reps - Standing Posterior Pelvic Tilt  - 3 x daily - 7 x weekly - 3 sets - 10 reps - 5 seconds hold  - Mini Squat with Counter Support  - 1 x daily - 7 x weekly - 3 sets - 10 reps  - Standing Hip Abduction with Resistance at Ankles and Counter Support  - 1 x daily - 7 x weekly - 1-3 sets - 10 reps - 5 seconds hold   Recommended for pt to perform scapular retractions and chin tucks at home (sitting) to help continue to decrease neck stiffness. Pt demonstrated and verbalized understanding.    - Seated Toe Raise  - 1 x daily - 7 x weekly - 1-3 sets - 10 reps - 5 seconds hold Then with folded towel under heel to promote more strength in more B ankle PF position (added on 10/30/2021)  - Long Sitting Ankle Eversion with Resistance  - 1 x daily - 7 x weekly - 2-3 sets - 10 reps  Yellow band     Seated B heel raises  5x5 seconds for 2 sets   Then with folded towel under heel to promote more strength in more B ankle PF position    5x5 seconds for 2 sets   PT  Short Term Goals - 08/28/22 1617       PT SHORT TERM GOAL #1   Title Pt will be independent with his initial HEP to improve strength, balance, and function.    Baseline Pt has started his HEP. (07/24/2022); pt demonstrates independence with his inital HEP (08/28/2022)    Time 3    Period Weeks    Status Achieved    Target Date 08/18/22              PT Long Term Goals - 09/20/22 1519       PT LONG TERM GOAL #1   Title Pt will improve B ankle PF, DF, EV, and IV strength by at least 1/2 MMT grade to promote ability to ambulate, as well as perform standing tasks with less difficulty and decreased fall risk.    Baseline Manually resisted ankle: PF 3+/5 R, and L;  DF 3+/5 R, 3/5 L; EV 4-/5 R, 3+/5 L; IV 3+/5 R and L (07/24/2022); Manually  resisted ankle PF 4-/5 R, 3+/5 L,  DF 3+/5 R, 3+/5 L, EV 4-/5 R, 3+/5 L, IV 3+/5 R, 3+/5 L (08/28/2022); Manually resisted ankle: PF 4/5 R, 4-/5 L, DF 3+/5 R, 3+/5 L, EV 4/5 R, 4-/5L, IV 4-/5 R, 4-/5 L (09/20/2022)    Time 8    Period Weeks    Status Partially Met    Target Date 11/17/22      PT LONG TERM GOAL #2   Title Pt will improve B hip extension and abduction strength to promote ability to perform standing tasks with less difficulty and decreased fall risk.    Baseline Hip extension 3+/5 R and L; hip abduction 4/5 R and L (07/24/2022); hip extension 4/5 R and L, hip abduction 4+/5 R and L (08/28/2022); Hip extension 4/5 R, 4/5 L, hip abduction 4+/5 R, 5/5 L (09/20/2022)    Time 8    Period Weeks    Status Achieved    Target Date 09/22/22      PT LONG TERM GOAL #3   Title Pt will improve his FOTO score by at least 10 points as a demonsration of improved function.    Baseline Foot FOTO 67 (07/24/2022); 67 (09/13/2022)    Time 8    Period Weeks    Status On-going    Target Date 11/17/22      PT LONG TERM GOAL #4   Title Pt will improve his DGI score to at least 19 points as a demonstration of improved balance and decreased fall risk.    Baseline DGI 13  (07/24/2022); 16 (08/28/2022);    Time 8    Period Weeks    Status Partially Met    Target Date 11/17/22              Plan - 11/07/22 0819     Personal Factors and Comorbidities Comorbidity 3+;Age;Past/Current Experience;Time since onset of injury/illness/exacerbation;Profession;Fitness    Comorbidities HTN, DM, neuropathy    Examination-Activity Limitations Stand;Lift;Carry;Bend;Locomotion Level;Dressing;Squat;Transfers;Stairs    Stability/Clinical Decision Making Stable/Uncomplicated   Balance seem to be worsening based on subjective reports.   Rehab Potential Fair    PT Frequency 2x / week    PT Duration 8 weeks    PT Treatment/Interventions Therapeutic exercise;Balance training;Functional mobility training;Stair training;Therapeutic activities;Neuromuscular re-education;Patient/family education;Manual techniques;Electrical Stimulation;Gait training    PT Next Visit Plan lumbopelvic strengthening, stability and control, B hip, thigh, and ankle strengthening, balance, gait, manual techniques, modalities PRN.    PT Home Exercise Plan Medbridge Access Code: [email or web address]    Consulted and Agree with Plan of Care Patient                  Loralyn Freshwater PT, DPT  11/07/2022, 12:46 PM

## 2022-11-14 ENCOUNTER — Telehealth: Payer: Self-pay

## 2022-11-14 ENCOUNTER — Ambulatory Visit: Payer: Medicare Other

## 2022-11-14 NOTE — Telephone Encounter (Signed)
No show. Called pt who said that he thought he cancelled today's appointment and had one for this Thursday. Will be able to make it to his next follow up session next week.

## 2022-11-15 ENCOUNTER — Other Ambulatory Visit: Payer: Self-pay | Admitting: Nurse Practitioner

## 2022-11-15 DIAGNOSIS — Z79899 Other long term (current) drug therapy: Secondary | ICD-10-CM | POA: Diagnosis not present

## 2022-11-15 DIAGNOSIS — E785 Hyperlipidemia, unspecified: Secondary | ICD-10-CM | POA: Diagnosis not present

## 2022-11-15 DIAGNOSIS — G629 Polyneuropathy, unspecified: Secondary | ICD-10-CM | POA: Diagnosis not present

## 2022-11-15 DIAGNOSIS — E1169 Type 2 diabetes mellitus with other specified complication: Secondary | ICD-10-CM | POA: Diagnosis not present

## 2022-11-16 NOTE — Telephone Encounter (Signed)
Requested Prescriptions  Pending Prescriptions Disp Refills   rosuvastatin (CRESTOR) 10 MG tablet [Pharmacy Med Name: ROSUVASTATIN CALCIUM 10 MG TAB] 12 tablet 0    Sig: TAKE ONE (1) TABLET BY MOUTH THREE TIMESPER WEEK     Cardiovascular:  Antilipid - Statins 2 Failed - 11/15/2022 11:45 AM      Failed - Cr in normal range and within 360 days    Creatinine  Date Value Ref Range Status  01/05/2014 0.75 0.60 - 1.30 mg/dL Final   Creatinine, Ser  Date Value Ref Range Status  09/29/2022 0.72 (L) 0.76 - 1.27 mg/dL Final         Failed - Lipid Panel in normal range within the last 12 months    Cholesterol, Total  Date Value Ref Range Status  06/02/2022 143 100 - 199 mg/dL Final   Cholesterol Piccolo, Waived  Date Value Ref Range Status  07/30/2018 131 <200 mg/dL Final    Comment:                            Desirable                <200                         Borderline High      200- 239                         High                     >239    LDL Chol Calc (NIH)  Date Value Ref Range Status  06/02/2022 57 0 - 99 mg/dL Final   HDL  Date Value Ref Range Status  06/02/2022 47 >39 mg/dL Final   Triglycerides  Date Value Ref Range Status  06/02/2022 244 (H) 0 - 149 mg/dL Final   Triglycerides Piccolo,Waived  Date Value Ref Range Status  07/30/2018 121 <150 mg/dL Final    Comment:                            Normal                   <150                         Borderline High     150 - 199                         High                200 - 499                         Very High                >499          Passed - Patient is not pregnant      Passed - Valid encounter within last 12 months    Recent Outpatient Visits           1 month ago Hypertension associated with diabetes (HCC)    [facility] [place], [place] T, NP   2 months ago Type 2 diabetes mellitus with  diabetic neuropathy, without long-term current use of insulin (HCC)   [place]  [facility] [place], [place] T, NP   5 months ago Type 2 diabetes mellitus with diabetic neuropathy, without long-term current use of insulin (HCC)   [facility] [place], [place] T, NP   11 months ago Type 2 diabetes mellitus with diabetic neuropathy, without long-term current use of insulin (HCC)   [facility] [place], [place] T, NP   1 year ago Type 2 diabetes mellitus with diabetic neuropathy, without long-term current use of insulin (HCC)   [place] [facility] [place], Dorie Rank, NP       Future Appointments             In 2 weeks [place], Dorie Rank, NP  [facility], [facility]   In 2 months Vanna Scotland, MD [facility]

## 2022-11-17 ENCOUNTER — Telehealth: Payer: Self-pay | Admitting: Nurse Practitioner

## 2022-11-17 NOTE — Telephone Encounter (Signed)
Copied from CRM [id number]. Topic: Medicare AWV >> Nov 17, 2022 10:58 AM Payton Doughty wrote: Reason for CRM: Called LM 11/17/2022 to schedule AWV   Verlee Rossetti; Care Guide Ambulatory Clinical Support [place] [facility] Direct Dial: [phone]

## 2022-11-22 ENCOUNTER — Ambulatory Visit: Payer: Medicare Other | Attending: Nurse Practitioner

## 2022-11-22 DIAGNOSIS — R2681 Unsteadiness on feet: Secondary | ICD-10-CM | POA: Insufficient documentation

## 2022-11-22 DIAGNOSIS — Z9181 History of falling: Secondary | ICD-10-CM | POA: Insufficient documentation

## 2022-11-22 DIAGNOSIS — M6281 Muscle weakness (generalized): Secondary | ICD-10-CM | POA: Diagnosis not present

## 2022-11-22 DIAGNOSIS — R262 Difficulty in walking, not elsewhere classified: Secondary | ICD-10-CM | POA: Insufficient documentation

## 2022-11-22 NOTE — Therapy (Signed)
OUTPATIENT PHYSICAL THERAPY TREATMENT NOTE    Patient Name: Tyler Morgan MRN: [id number] DOB:June 09, 1945, 77 y.o., male Today's Date: 11/22/2022  PCP: [name], NP  REFERRING PROVIDER: [name], NP   END OF SESSION:  PT End of Session - 11/22/22 0820     Visit Number 25    Number of Visits 49    Date for PT Re-Evaluation 01/19/23    Authorization - Number of Visits --    PT Start Time 0820    PT Stop Time 0901    PT Time Calculation (min) 41 min    Activity Tolerance Patient tolerated treatment well    Behavior During Therapy [facility] for tasks assessed/performed                                   Past Medical History:  Diagnosis Date   Diabetes mellitus without complication (HCC)    ED (erectile dysfunction)    Hypertension    Motion sickness    boats   Neuropathy    Prostate enlargement    Urinary retention    Past Surgical History:  Procedure Laterality Date   BASAL CELL CARCINOMA EXCISION     nose    CATARACT EXTRACTION Bilateral 1986, 2014   COLONOSCOPY WITH PROPOFOL N/A 03/27/2018   Procedure: COLONOSCOPY WITH BIOPSIES;  Surgeon: Toney Reil, MD;  Location: [facility];  Service: Endoscopy;  Laterality: N/A;  Diabetic - oral meds   EYE SURGERY Bilateral [phone]   FRACTURE SURGERY Right    as a child/ right arm   HERNIA REPAIR Right 01-13-14   inguinal hernia   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 N/A 02/07/2017   Procedure: 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;  Surgeon: Vanna Scotland, MD;  Location: [facility];  Service: Urology;  Laterality: N/A;   POLYPECTOMY N/A 03/27/2018   Procedure: POLYPECTOMY;  Surgeon: Toney Reil, MD;  Location: [facility];  Service: Endoscopy;  Laterality: N/A;   RETINAL DETACHMENT SURGERY  Jan 2015   TONSILLECTOMY  1955   Patient Active Problem List   Diagnosis Date Noted   PVD (peripheral vascular disease)  (HCC) 11/30/2021   B12 deficiency 01/29/2019   History of prostate cancer 12/13/2017   Hemorrhoids 12/13/2017   Hyperlipidemia associated with type 2 diabetes mellitus (HCC) 04/02/2017   Advanced care planning/counseling discussion 10/11/2016   Type 2 diabetes mellitus with diabetic neuropathy (HCC) 12/15/2014   Hypertension associated with diabetes (HCC)     REFERRING DIAG: E11.40 (ICD-10-CM) - Type 2 diabetes mellitus with diabetic neuropathy, without long-term current use of insulin (HCC)   THERAPY DIAG:  Unsteadiness on feet - Plan: PT plan of care cert/re-cert  Difficulty in walking, not elsewhere classified - Plan: PT plan of care cert/re-cert  Muscle weakness (generalized) - Plan: PT plan of care cert/re-cert  History of falling - Plan: PT plan of care cert/re-cert  Rationale for Evaluation and Treatment Rehabilitation  PERTINENT HISTORY: Balance, weakness. Can get around well without a cane. Standing still, pt loses balance. Has to hold onto things to stabilize himself. Has labradore thats about 77 year old and can walk his dog without a leash because the dog's pulling stabilizes him. Symptoms have been going on for a while and was told to have diabetic neuropathy. Had a fall in 2015 while working on the flooring in his den at home which caused R  inguinal hernia S/P mesh placement. Pt states noticing problems in his legs since 2015. Pt also could not push up on his toes (tip toe) since then. No back pain currently but has had a hx of back pain over the years. Decreased sensation B feet, almost not noticeable. Feels like he has a lot of weakness which makes it difficult to keep his balance. Has B LE swelling, comes and goes, not currently getting treated for it. No low back pain, just L arm and shoulder.   PRECAUTIONS: Fall risk; L5 anterolisthesis   SUBJECTIVE:   SUBJECTIVE STATEMENT: Not doing well today. Just getting over a cold. Missed a couple of days worth of exercises, just  did not really feel like doing anything. Never caught himself with a fever. Has been feeling better since right before the weekend. The neurologist appointment went well he guesses. Still needs to make an appointment to see him again in December. Also waiting to get an appointment to get nerve conduction tests. A lot of blood was taken out of his L arm. A lot of weakness was in his ankles L > R. Does not know what all the blood tests were for. No falls recently. Pt states both ears are stuffy from his cold       PAIN:  Are you having pain? See subjective   TODAY'S TREATMENT:                                                                                                                                         DATE: 11/22/2022      Per MRI on 12/26/2016: 1. 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 There is probable chronic bilateral L5 nerve root encroachment.      No latex allergies.       Therapeutic exercise   Directed patient with gait with normal gait speed, with changes in speed, 180 degree pivot turn, with R and L cervical rotation position, with cervical flexion and extension position, stepping around obstacles, stepping over an obstacle, ascending and descending 4 regular steps with UE assist    Seated manually resisted B ankle PF, DF, EV, and IV 1x each weay for each LE  Reviewed progress/current status with PT towards goals  Reviewed POC 2x/week for 8 weeks  Seated manually resisted eccentric ankle DF   R 10x2  L 10x2  Seated manually resisted eccentric ankle PF   R 10x2  L 10x2   Improved exercise technique, movement at target joints, use of target muscles after mod verbal, visual, tactile cues.         Response to treatment Pt tolerated session well without aggravation of symptoms.       Clinical impression  Pt demonstrates improved L ankle EV strength, and R  ankle IV strength since previous measurement. Pt also demonstrates decreased  DGI score compared to previous measurement in which a hiatus from PT and a cold with B ear congestion may play a factor with balance. Continued working on improving B ankle DF and PF strength to promote ankle stability and balance during closed chain tasks. Challenges to progress include chronicity of B ankle weakness. Pt tolerated session well without aggravation of symptoms. Pt will benefit from skilled physical therapy services to improve strength, balance, and function.         PATIENT EDUCATION: Education details: there-ex, HEP Person educated: Patient Education method: Explanation, Demonstration, Tactile cues, Verbal cues, and Handouts Education comprehension: verbalized understanding and returned demonstration  HOME EXERCISE PROGRAM: Access Code: [email or web address] URL: [email or web address] Date: 07/24/2022 Prepared by: Loralyn Freshwater   Exercises - Seated Transversus Abdominis Bracing  - 5 x daily - 7 x weekly - 3 sets - 10 reps - 5 seconds hold  - Supine Posterior Pelvic Tilt  - 1 x daily - 7 x weekly - 3 sets - 10 reps - 5 seconds hold - Long Sitting Ankle Plantar Flexion with Resistance  - 1 x daily - 7 x weekly - 3 sets - 10 reps  Upgraded to green band on 10/17/2022     - Long Sitting Ankle Dorsiflexion AROM  - 1 x daily - 7 x weekly - 3 sets - 10 reps - 5 seconds hold  - Supine March with Posterior Pelvic Tilt  - 1 x daily - 7 x weekly - 3 sets - 10 reps - Curl Up with Arms Crossed  - 1 x daily - 7 x weekly - 3 sets - 10 reps - Supine Double Knee to Chest  - 1 x daily - 7 x weekly - 3 sets - 10 reps - 5 seconds hold - Bent Knee Fallouts with Alternating Legs  - 1 x daily - 7 x weekly - 3 sets - 10 reps - Standing Posterior Pelvic Tilt  - 3 x daily - 7 x weekly - 3 sets - 10 reps - 5 seconds hold  - Mini Squat with Counter Support  - 1 x daily - 7 x weekly - 3 sets - 10 reps  - Standing  Hip Abduction with Resistance at Ankles and Counter Support  - 1 x daily - 7 x weekly - 1-3 sets - 10 reps - 5 seconds hold   Recommended for pt to perform scapular retractions and chin tucks at home (sitting) to help continue to decrease neck stiffness. Pt demonstrated and verbalized understanding.    - Seated Toe Raise  - 1 x daily - 7 x weekly - 1-3 sets - 10 reps - 5 seconds hold Then with folded towel under heel to promote more strength in more B ankle PF position (added on 10/30/2021)  - Long Sitting Ankle Eversion with Resistance  - 1 x daily - 7 x weekly - 2-3 sets - 10 reps  Yellow band     Seated B heel raises  5x5 seconds for 2 sets   Then with folded towel under heel to promote more strength in more B ankle PF position    5x5 seconds for 2 sets   PT Short Term Goals - 08/28/22 1617       PT SHORT TERM GOAL #1   Title Pt will be independent with his initial HEP to improve strength, balance, and function.    Baseline Pt has started his HEP. (07/24/2022); pt demonstrates independence with his inital HEP (08/28/2022)  Time 3    Period Weeks    Status Achieved    Target Date 08/18/22              PT Long Term Goals - 11/22/22 0841       PT LONG TERM GOAL #1   Title Pt will improve B ankle PF, DF, EV, and IV strength by at least 1/2 MMT grade to promote ability to ambulate, as well as perform standing tasks with less difficulty and decreased fall risk.    Baseline Manually resisted ankle: PF 3+/5 R, and L;  DF 3+/5 R, 3/5 L; EV 4-/5 R, 3+/5 L; IV 3+/5 R and L (07/24/2022); Manually resisted ankle PF 4-/5 R, 3+/5 L,  DF 3+/5 R, 3+/5 L, EV 4-/5 R, 3+/5 L, IV 3+/5 R, 3+/5 L (08/28/2022); Manually resisted ankle: PF 4/5 R, 4-/5 L, DF 3+/5 R, 3+/5 L, EV 4/5 R, 4-/5L, IV 4-/5 R, 4-/5 L (09/20/2022); Manually resisted ankle PF 4/5 R, 4-/5 L, DF 3+/5 R, 3+/5 L, EV 4/5 R, 4/5 L, IV 4/5 R, 4-/5 L (11/22/2022)    Time 8    Period Weeks    Status Partially Met    Target Date 01/19/23       PT LONG TERM GOAL #2   Title Pt will improve B hip extension and abduction strength to promote ability to perform standing tasks with less difficulty and decreased fall risk.    Baseline Hip extension 3+/5 R and L; hip abduction 4/5 R and L (07/24/2022); hip extension 4/5 R and L, hip abduction 4+/5 R and L (08/28/2022); Hip extension 4/5 R, 4/5 L, hip abduction 4+/5 R, 5/5 L (09/20/2022)    Time 8    Period Weeks    Status Achieved    Target Date 09/22/22      PT LONG TERM GOAL #3   Title Pt will improve his FOTO score by at least 10 points as a demonsration of improved function.    Baseline Foot FOTO 67 (07/24/2022); 67 (09/13/2022)    Time 8    Period Weeks    Status On-going    Target Date 01/19/23      PT LONG TERM GOAL #4   Title Pt will improve his DGI score to at least 19 points as a demonstration of improved balance and decreased fall risk.    Baseline DGI 13 (07/24/2022); 16 (08/28/2022); 15 (11/22/2022)    Time 8    Period Weeks    Status On-going    Target Date 01/19/23              Plan - 11/22/22 1713     Clinical Impression Statement Pt demonstrates improved L ankle EV strength, and R ankle IV strength since previous measurement. Pt also demonstrates decreased DGI score compared to previous measurement in which a hiatus from PT and a cold with B ear congestion may play a factor with balance. Continued working on improving B ankle DF and PF strength to promote ankle stability and balance during closed chain tasks. Challenges to progress include chronicity of B ankle weakness. Pt tolerated session well without aggravation of symptoms. Pt will benefit from skilled physical therapy services to improve strength, balance, and function.    Personal Factors and Comorbidities Comorbidity 3+;Age;Past/Current Experience;Time since onset of injury/illness/exacerbation;Profession;Fitness    Comorbidities HTN, DM, neuropathy    Examination-Activity Limitations  Stand;Lift;Carry;Bend;Locomotion Level;Dressing;Squat;Transfers;Stairs    Stability/Clinical Decision Making Stable/Uncomplicated   Balance seem to be worsening  based on subjective reports.   Clinical Decision Making Low    Rehab Potential Fair    PT Frequency 2x / week    PT Duration 8 weeks    PT Treatment/Interventions Therapeutic exercise;Balance training;Functional mobility training;Stair training;Therapeutic activities;Neuromuscular re-education;Patient/family education;Manual techniques;Electrical Stimulation;Gait training    PT Next Visit Plan lumbopelvic strengthening, stability and control, B hip, thigh, and ankle strengthening, balance, gait, manual techniques, modalities PRN.    PT Home Exercise Plan Medbridge Access Code: [email or web address]    Consulted and Agree with Plan of Care Patient                   Loralyn Freshwater PT, DPT  11/22/2022, 5:21 PM

## 2022-11-27 ENCOUNTER — Ambulatory Visit: Payer: Medicare Other

## 2022-11-27 DIAGNOSIS — R2681 Unsteadiness on feet: Secondary | ICD-10-CM | POA: Diagnosis not present

## 2022-11-27 DIAGNOSIS — Z9181 History of falling: Secondary | ICD-10-CM | POA: Diagnosis not present

## 2022-11-27 DIAGNOSIS — R262 Difficulty in walking, not elsewhere classified: Secondary | ICD-10-CM

## 2022-11-27 DIAGNOSIS — M6281 Muscle weakness (generalized): Secondary | ICD-10-CM | POA: Diagnosis not present

## 2022-11-27 NOTE — Therapy (Signed)
OUTPATIENT PHYSICAL THERAPY TREATMENT NOTE    Patient Name: Tyler Morgan MRN: [id number] DOB:1945-03-14, 77 y.o., male Today's Date: 11/27/2022  PCP: [name], NP  REFERRING PROVIDER: [name], NP   END OF SESSION:  PT End of Session - 11/27/22 0819     Visit Number 26    Number of Visits 49    Date for PT Re-Evaluation 01/19/23    PT Start Time 0819    PT Stop Time 0900    PT Time Calculation (min) 41 min    Activity Tolerance Patient tolerated treatment well    Behavior During Therapy [facility] for tasks assessed/performed                                    Past Medical History:  Diagnosis Date   Diabetes mellitus without complication (HCC)    ED (erectile dysfunction)    Hypertension    Motion sickness    boats   Neuropathy    Prostate enlargement    Urinary retention    Past Surgical History:  Procedure Laterality Date   BASAL CELL CARCINOMA EXCISION     nose    CATARACT EXTRACTION Bilateral 1986, 2014   COLONOSCOPY WITH PROPOFOL N/A 03/27/2018   Procedure: COLONOSCOPY WITH BIOPSIES;  Surgeon: Toney Reil, MD;  Location: [facility];  Service: Endoscopy;  Laterality: N/A;  Diabetic - oral meds   EYE SURGERY Bilateral [phone]   FRACTURE SURGERY Right    as a child/ right arm   HERNIA REPAIR Right 01-13-14   inguinal hernia   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 N/A 02/07/2017   Procedure: 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;  Surgeon: Vanna Scotland, MD;  Location: [facility];  Service: Urology;  Laterality: N/A;   POLYPECTOMY N/A 03/27/2018   Procedure: POLYPECTOMY;  Surgeon: Toney Reil, MD;  Location: [facility];  Service: Endoscopy;  Laterality: N/A;   RETINAL DETACHMENT SURGERY  Jan 2015   TONSILLECTOMY  1955   Patient Active Problem List   Diagnosis Date Noted   PVD (peripheral vascular disease) (HCC) 11/30/2021   B12 deficiency  01/29/2019   History of prostate cancer 12/13/2017   Hemorrhoids 12/13/2017   Hyperlipidemia associated with type 2 diabetes mellitus (HCC) 04/02/2017   Advanced care planning/counseling discussion 10/11/2016   Type 2 diabetes mellitus with diabetic neuropathy (HCC) 12/15/2014   Hypertension associated with diabetes (HCC)     REFERRING DIAG: E11.40 (ICD-10-CM) - Type 2 diabetes mellitus with diabetic neuropathy, without long-term current use of insulin (HCC)   THERAPY DIAG:  Unsteadiness on feet  Difficulty in walking, not elsewhere classified  Muscle weakness (generalized)  History of falling  Rationale for Evaluation and Treatment Rehabilitation  PERTINENT HISTORY: Balance, weakness. Can get around well without a cane. Standing still, pt loses balance. Has to hold onto things to stabilize himself. Has labradore thats about 77 year old and can walk his dog without a leash because the dog's pulling stabilizes him. Symptoms have been going on for a while and was told to have diabetic neuropathy. Had a fall in 2015 while working on the flooring in his den at home which caused R inguinal hernia S/P mesh placement. Pt states noticing problems in his legs since 2015. Pt also could not push up on his toes (tip toe) since then. No back pain currently but has had a hx  of back pain over the years. Decreased sensation B feet, almost not noticeable. Feels like he has a lot of weakness which makes it difficult to keep his balance. Has B LE swelling, comes and goes, not currently getting treated for it. No low back pain, just L arm and shoulder.   PRECAUTIONS: Fall risk; L5 anterolisthesis   SUBJECTIVE:   SUBJECTIVE STATEMENT:  Doing pretty good      PAIN:  Are you having pain? See subjective   TODAY'S TREATMENT:                                                                                                                                         DATE: 11/27/2022      Per MRI on  12/26/2016: 1. 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 There is probable chronic bilateral L5 nerve root encroachment.      No latex allergies.       Therapeutic exercise  Sitting with upright posture                Gentle manually resisted trunk extension isometrics in neutral to help decrease lumbar anterolisthesis                10x3 with 5 second holds   Stepping over 2 mini hurdles with one UE assist PRN  R 10x3   L 10x3  SLS with contralateral UE assist   R 5x15 seconds  L 5x15 seconds   Standing forward weight shift onto forefeet 10x5 seconds for 3 sets with UE light touch assist  To promote increase use and strengthening of B gastroc/soleus muscles.   Standing alternating toe taps onto cone with contralateral UE asist 10x4 each LE  Side stepping with B UE assist on treadmill bar  With Air Ex beam 5x each side     Improved exercise technique, movement at target joints, use of target muscles after mod verbal, visual, tactile cues.         Response to treatment Pt tolerated session well without aggravation of symptoms.       Clinical impression  Continued working on trunk strengthening to decrease stress to lower extremity nerves. Continued working on Radio producer and obstacle negotiation to improve balance and decrease difficulty with gait.  Pt tolerated session well without aggravation of symptoms. Pt will benefit from skilled physical therapy services to improve strength, balance, and function.         PATIENT EDUCATION: Education details: there-ex, HEP Person educated: Patient Education method: Explanation, Demonstration, Tactile cues, Verbal cues, and Handouts Education comprehension: verbalized understanding and returned demonstration  HOME EXERCISE PROGRAM: Access Code: [email or web address] URL: [email or web address] Date:  07/24/2022 Prepared by: Loralyn Freshwater   Exercises - Seated Transversus Abdominis Bracing  - 5 x daily - 7 x weekly - 3 sets - 10 reps -  5 seconds hold  - Supine Posterior Pelvic Tilt  - 1 x daily - 7 x weekly - 3 sets - 10 reps - 5 seconds hold - Long Sitting Ankle Plantar Flexion with Resistance  - 1 x daily - 7 x weekly - 3 sets - 10 reps  Upgraded to green band on 10/17/2022     - Long Sitting Ankle Dorsiflexion AROM  - 1 x daily - 7 x weekly - 3 sets - 10 reps - 5 seconds hold  - Supine March with Posterior Pelvic Tilt  - 1 x daily - 7 x weekly - 3 sets - 10 reps - Curl Up with Arms Crossed  - 1 x daily - 7 x weekly - 3 sets - 10 reps - Supine Double Knee to Chest  - 1 x daily - 7 x weekly - 3 sets - 10 reps - 5 seconds hold - Bent Knee Fallouts with Alternating Legs  - 1 x daily - 7 x weekly - 3 sets - 10 reps - Standing Posterior Pelvic Tilt  - 3 x daily - 7 x weekly - 3 sets - 10 reps - 5 seconds hold  - Mini Squat with Counter Support  - 1 x daily - 7 x weekly - 3 sets - 10 reps  - Standing Hip Abduction with Resistance at Ankles and Counter Support  - 1 x daily - 7 x weekly - 1-3 sets - 10 reps - 5 seconds hold   Recommended for pt to perform scapular retractions and chin tucks at home (sitting) to help continue to decrease neck stiffness. Pt demonstrated and verbalized understanding.    - Seated Toe Raise  - 1 x daily - 7 x weekly - 1-3 sets - 10 reps - 5 seconds hold Then with folded towel under heel to promote more strength in more B ankle PF position (added on 10/30/2021)  - Long Sitting Ankle Eversion with Resistance  - 1 x daily - 7 x weekly - 2-3 sets - 10 reps  Yellow band     Seated B heel raises  5x5 seconds for 2 sets   Then with folded towel under heel to promote more strength in more B ankle PF position    5x5 seconds for 2 sets   PT Short Term Goals - 08/28/22 1617       PT SHORT TERM GOAL #1   Title Pt will be independent with his initial HEP to  improve strength, balance, and function.    Baseline Pt has started his HEP. (07/24/2022); pt demonstrates independence with his inital HEP (08/28/2022)    Time 3    Period Weeks    Status Achieved    Target Date 08/18/22              PT Long Term Goals - 11/22/22 0841       PT LONG TERM GOAL #1   Title Pt will improve B ankle PF, DF, EV, and IV strength by at least 1/2 MMT grade to promote ability to ambulate, as well as perform standing tasks with less difficulty and decreased fall risk.    Baseline Manually resisted ankle: PF 3+/5 R, and L;  DF 3+/5 R, 3/5 L; EV 4-/5 R, 3+/5 L; IV 3+/5 R and L (07/24/2022); Manually resisted ankle PF 4-/5 R, 3+/5 L,  DF 3+/5 R, 3+/5 L, EV 4-/5 R, 3+/5 L, IV 3+/5 R, 3+/5 L (08/28/2022); Manually resisted ankle: PF 4/5 R, 4-/5  L, DF 3+/5 R, 3+/5 L, EV 4/5 R, 4-/5L, IV 4-/5 R, 4-/5 L (09/20/2022); Manually resisted ankle PF 4/5 R, 4-/5 L, DF 3+/5 R, 3+/5 L, EV 4/5 R, 4/5 L, IV 4/5 R, 4-/5 L (11/22/2022)    Time 8    Period Weeks    Status Partially Met    Target Date 01/19/23      PT LONG TERM GOAL #2   Title Pt will improve B hip extension and abduction strength to promote ability to perform standing tasks with less difficulty and decreased fall risk.    Baseline Hip extension 3+/5 R and L; hip abduction 4/5 R and L (07/24/2022); hip extension 4/5 R and L, hip abduction 4+/5 R and L (08/28/2022); Hip extension 4/5 R, 4/5 L, hip abduction 4+/5 R, 5/5 L (09/20/2022)    Time 8    Period Weeks    Status Achieved    Target Date 09/22/22      PT LONG TERM GOAL #3   Title Pt will improve his FOTO score by at least 10 points as a demonsration of improved function.    Baseline Foot FOTO 67 (07/24/2022); 67 (09/13/2022)    Time 8    Period Weeks    Status On-going    Target Date 01/19/23      PT LONG TERM GOAL #4   Title Pt will improve his DGI score to at least 19 points as a demonstration of improved balance and decreased fall risk.    Baseline DGI 13 (07/24/2022); 16  (08/28/2022); 15 (11/22/2022)    Time 8    Period Weeks    Status On-going    Target Date 01/19/23                      Loralyn Freshwater PT, DPT  11/27/2022, 11:51 AM

## 2022-11-29 ENCOUNTER — Ambulatory Visit: Payer: Medicare Other

## 2022-11-29 DIAGNOSIS — R262 Difficulty in walking, not elsewhere classified: Secondary | ICD-10-CM

## 2022-11-29 DIAGNOSIS — R2681 Unsteadiness on feet: Secondary | ICD-10-CM | POA: Diagnosis not present

## 2022-11-29 DIAGNOSIS — M6281 Muscle weakness (generalized): Secondary | ICD-10-CM | POA: Diagnosis not present

## 2022-11-29 DIAGNOSIS — Z9181 History of falling: Secondary | ICD-10-CM

## 2022-11-29 NOTE — Therapy (Signed)
OUTPATIENT PHYSICAL THERAPY TREATMENT NOTE    Patient Name: Tyler Morgan MRN: [id number] DOB:1946-01-09, 77 y.o., male Today's Date: 11/29/2022  PCP: [name], NP  REFERRING PROVIDER: [name], NP   END OF SESSION:  PT End of Session - 11/29/22 0818     Visit Number 27    Number of Visits 49    Date for PT Re-Evaluation 01/19/23    PT Start Time 0819    PT Stop Time 0900    PT Time Calculation (min) 41 min    Activity Tolerance Patient tolerated treatment well    Behavior During Therapy [facility] for tasks assessed/performed                                     Past Medical History:  Diagnosis Date   Diabetes mellitus without complication (HCC)    ED (erectile dysfunction)    Hypertension    Motion sickness    boats   Neuropathy    Prostate enlargement    Urinary retention    Past Surgical History:  Procedure Laterality Date   BASAL CELL CARCINOMA EXCISION     nose    CATARACT EXTRACTION Bilateral 1986, 2014   COLONOSCOPY WITH PROPOFOL N/A 03/27/2018   Procedure: COLONOSCOPY WITH BIOPSIES;  Surgeon: Toney Reil, MD;  Location: [facility];  Service: Endoscopy;  Laterality: N/A;  Diabetic - oral meds   EYE SURGERY Bilateral [phone]   FRACTURE SURGERY Right    as a child/ right arm   HERNIA REPAIR Right 01-13-14   inguinal hernia   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 N/A 02/07/2017   Procedure: 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;  Surgeon: Vanna Scotland, MD;  Location: [facility] ORS;  Service: Urology;  Laterality: N/A;   POLYPECTOMY N/A 03/27/2018   Procedure: POLYPECTOMY;  Surgeon: Toney Reil, MD;  Location: [facility];  Service: Endoscopy;  Laterality: N/A;   RETINAL DETACHMENT SURGERY  Jan 2015   TONSILLECTOMY  1955   Patient Active Problem List   Diagnosis Date Noted   PVD (peripheral vascular disease) (HCC) 11/30/2021   B12 deficiency  01/29/2019   History of prostate cancer 12/13/2017   Hemorrhoids 12/13/2017   Hyperlipidemia associated with type 2 diabetes mellitus (HCC) 04/02/2017   Advanced care planning/counseling discussion 10/11/2016   Type 2 diabetes mellitus with diabetic neuropathy (HCC) 12/15/2014   Hypertension associated with diabetes (HCC)     REFERRING DIAG: E11.40 (ICD-10-CM) - Type 2 diabetes mellitus with diabetic neuropathy, without long-term current use of insulin (HCC)   THERAPY DIAG:  Unsteadiness on feet  Difficulty in walking, not elsewhere classified  Muscle weakness (generalized)  History of falling  Rationale for Evaluation and Treatment Rehabilitation  PERTINENT HISTORY: Balance, weakness. Can get around well without a cane. Standing still, pt loses balance. Has to hold onto things to stabilize himself. Has labradore thats about 77 year old and can walk his dog without a leash because the dog's pulling stabilizes him. Symptoms have been going on for a while and was told to have diabetic neuropathy. Had a fall in 2015 while working on the flooring in his den at home which caused R inguinal hernia S/P mesh placement. Pt states noticing problems in his legs since 2015. Pt also could not push up on his toes (tip toe) since then. No back pain currently but has had a  hx of back pain over the years. Decreased sensation B feet, almost not noticeable. Feels like he has a lot of weakness which makes it difficult to keep his balance. Has B LE swelling, comes and goes, not currently getting treated for it. No low back pain, just L arm and shoulder.   PRECAUTIONS: Fall risk; L5 anterolisthesis   SUBJECTIVE:   SUBJECTIVE STATEMENT:  Doing ok.      PAIN:  Are you having pain? See subjective   TODAY'S TREATMENT:                                                                                                                                         DATE: 11/29/2022      Per MRI on 12/26/2016: 1.  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 There is probable chronic bilateral L5 nerve root encroachment.      No latex allergies.       Therapeutic exercise  Sitting with upright posture                Gentle manually resisted trunk extension isometrics in neutral to help decrease lumbar anterolisthesis                10x3 with 5 second holds   Forward step up onto Air Ex pad with contralateral UE light touch assist   R 10x3  L 10x3   Standing forward weight shift onto forefeet 10x5 seconds for 3 sets with UE light touch assist  To promote increase use and strengthening of B gastroc/soleus muscles.   SLS with contralateral UE assist   R 5x15 seconds for 2 sets  L 5x15 seconds for 2 sets   Standing alternating toe taps onto cone with contralateral UE asist 10x4 each LE  Side stepping with B UE assist on treadmill bar  With Air Ex beam 5x each side   Improved exercise technique, movement at target joints, use of target muscles after mod verbal, visual, tactile cues.         Response to treatment Pt tolerated session well without aggravation of symptoms.       Clinical impression  Worked on standing balance activities to challenge closed chain muscle use of gastroc/soleus muscles as well as improving closed chain glute strength for obstacle negotiation. Good glute med and gastroc/soleus muscle use reported by pt. Pt tolerated session well without aggravation of symptoms.  Pt will benefit from skilled physical therapy services to improve strength, balance, and function.         PATIENT EDUCATION: Education details: there-ex, HEP Person educated: Patient Education method: Explanation, Demonstration, Tactile cues, Verbal cues, and Handouts Education comprehension: verbalized understanding and returned demonstration  HOME EXERCISE PROGRAM: Access Code: [email or web address] URL:  [email or web address] Date: 07/24/2022 Prepared by: Loralyn Freshwater   Exercises - Seated Transversus Abdominis Bracing  -  5 x daily - 7 x weekly - 3 sets - 10 reps - 5 seconds hold  - Supine Posterior Pelvic Tilt  - 1 x daily - 7 x weekly - 3 sets - 10 reps - 5 seconds hold - Long Sitting Ankle Plantar Flexion with Resistance  - 1 x daily - 7 x weekly - 3 sets - 10 reps  Upgraded to green band on 10/17/2022     - Long Sitting Ankle Dorsiflexion AROM  - 1 x daily - 7 x weekly - 3 sets - 10 reps - 5 seconds hold  - Supine March with Posterior Pelvic Tilt  - 1 x daily - 7 x weekly - 3 sets - 10 reps - Curl Up with Arms Crossed  - 1 x daily - 7 x weekly - 3 sets - 10 reps - Supine Double Knee to Chest  - 1 x daily - 7 x weekly - 3 sets - 10 reps - 5 seconds hold - Bent Knee Fallouts with Alternating Legs  - 1 x daily - 7 x weekly - 3 sets - 10 reps - Standing Posterior Pelvic Tilt  - 3 x daily - 7 x weekly - 3 sets - 10 reps - 5 seconds hold  - Mini Squat with Counter Support  - 1 x daily - 7 x weekly - 3 sets - 10 reps  - Standing Hip Abduction with Resistance at Ankles and Counter Support  - 1 x daily - 7 x weekly - 1-3 sets - 10 reps - 5 seconds hold   Recommended for pt to perform scapular retractions and chin tucks at home (sitting) to help continue to decrease neck stiffness. Pt demonstrated and verbalized understanding.    - Seated Toe Raise  - 1 x daily - 7 x weekly - 1-3 sets - 10 reps - 5 seconds hold Then with folded towel under heel to promote more strength in more B ankle PF position (added on 10/30/2021)  - Long Sitting Ankle Eversion with Resistance  - 1 x daily - 7 x weekly - 2-3 sets - 10 reps  Yellow band     Seated B heel raises  5x5 seconds for 2 sets   Then with folded towel under heel to promote more strength in more B ankle PF position    5x5 seconds for 2 sets   PT Short Term Goals - 08/28/22 1617       PT SHORT TERM GOAL #1   Title Pt  will be independent with his initial HEP to improve strength, balance, and function.    Baseline Pt has started his HEP. (07/24/2022); pt demonstrates independence with his inital HEP (08/28/2022)    Time 3    Period Weeks    Status Achieved    Target Date 08/18/22              PT Long Term Goals - 11/22/22 0841       PT LONG TERM GOAL #1   Title Pt will improve B ankle PF, DF, EV, and IV strength by at least 1/2 MMT grade to promote ability to ambulate, as well as perform standing tasks with less difficulty and decreased fall risk.    Baseline Manually resisted ankle: PF 3+/5 R, and L;  DF 3+/5 R, 3/5 L; EV 4-/5 R, 3+/5 L; IV 3+/5 R and L (07/24/2022); Manually resisted ankle PF 4-/5 R, 3+/5 L,  DF 3+/5 R, 3+/5 L, EV 4-/5 R, 3+/5  L, IV 3+/5 R, 3+/5 L (08/28/2022); Manually resisted ankle: PF 4/5 R, 4-/5 L, DF 3+/5 R, 3+/5 L, EV 4/5 R, 4-/5L, IV 4-/5 R, 4-/5 L (09/20/2022); Manually resisted ankle PF 4/5 R, 4-/5 L, DF 3+/5 R, 3+/5 L, EV 4/5 R, 4/5 L, IV 4/5 R, 4-/5 L (11/22/2022)    Time 8    Period Weeks    Status Partially Met    Target Date 01/19/23      PT LONG TERM GOAL #2   Title Pt will improve B hip extension and abduction strength to promote ability to perform standing tasks with less difficulty and decreased fall risk.    Baseline Hip extension 3+/5 R and L; hip abduction 4/5 R and L (07/24/2022); hip extension 4/5 R and L, hip abduction 4+/5 R and L (08/28/2022); Hip extension 4/5 R, 4/5 L, hip abduction 4+/5 R, 5/5 L (09/20/2022)    Time 8    Period Weeks    Status Achieved    Target Date 09/22/22      PT LONG TERM GOAL #3   Title Pt will improve his FOTO score by at least 10 points as a demonsration of improved function.    Baseline Foot FOTO 67 (07/24/2022); 67 (09/13/2022)    Time 8    Period Weeks    Status On-going    Target Date 01/19/23      PT LONG TERM GOAL #4   Title Pt will improve his DGI score to at least 19 points as a demonstration of improved balance and decreased  fall risk.    Baseline DGI 13 (07/24/2022); 16 (08/28/2022); 15 (11/22/2022)    Time 8    Period Weeks    Status On-going    Target Date 01/19/23              Plan - 11/29/22 1124     Clinical Impression Statement Worked on standing balance activities to challenge closed chain muscle use of gastroc/soleus muscles as well as improving closed chain glute strength for obstacle negotiation. Good glute med and gastroc/soleus muscle use reported by pt. Pt tolerated session well without aggravation of symptoms.  Pt will benefit from skilled physical therapy services to improve strength, balance, and function.    Personal Factors and Comorbidities Comorbidity 3+;Age;Past/Current Experience;Time since onset of injury/illness/exacerbation;Profession;Fitness    Comorbidities HTN, DM, neuropathy    Examination-Activity Limitations Stand;Lift;Carry;Bend;Locomotion Level;Dressing;Squat;Transfers;Stairs    Stability/Clinical Decision Making Stable/Uncomplicated   Balance seem to be worsening based on subjective reports.   Rehab Potential Fair    PT Frequency 2x / week    PT Duration 8 weeks    PT Treatment/Interventions Therapeutic exercise;Balance training;Functional mobility training;Stair training;Therapeutic activities;Neuromuscular re-education;Patient/family education;Manual techniques;Electrical Stimulation;Gait training    PT Next Visit Plan lumbopelvic strengthening, stability and control, B hip, thigh, and ankle strengthening, balance, gait, manual techniques, modalities PRN.    PT Home Exercise Plan Medbridge Access Code: [email or web address]    Consulted and Agree with Plan of Care Patient                    Loralyn Freshwater PT, DPT  11/29/2022, 11:24 AM

## 2022-12-03 ENCOUNTER — Other Ambulatory Visit: Payer: Self-pay | Admitting: Nurse Practitioner

## 2022-12-03 DIAGNOSIS — E1159 Type 2 diabetes mellitus with other circulatory complications: Secondary | ICD-10-CM

## 2022-12-03 NOTE — Patient Instructions (Incomplete)
Be Involved in Caring For Your Health:  Taking Medications When medications are taken as directed, they can greatly improve your health. But if they are not taken as prescribed, they may not work. In some cases, not taking them correctly can be harmful. To help ensure your treatment remains effective and safe, understand your medications and how to take them. Bring your medications to each visit for review by your provider.  Your lab results, notes, and after visit summary will be available on My Chart. We strongly encourage you to use this feature. If lab results are abnormal the clinic will contact you with the appropriate steps. If the clinic does not contact you assume the results are satisfactory. You can always view your results on My Chart. If you have questions regarding your health or results, please contact the clinic during office hours. You can also ask questions on My Chart.  We at [facility] are grateful that you chose [place] to provide your care. We strive to provide evidence-based and compassionate care and are always looking for feedback. If you get a survey from the clinic please complete this so we can hear your opinions.  Neuropathic Pain Neuropathic pain is pain caused by damage to the nerves that are responsible for certain sensations in your body (sensory nerves). Neuropathic pain can make you more sensitive to pain. Even a minor sensation can feel very painful. This is usually a long-term (chronic) condition that can be difficult to treat. The type of pain differs from person to person. It may: Start suddenly (acute), or it may develop slowly and become chronic. Come and go as damaged nerves heal, or it may stay at the same level for years. Cause emotional distress, loss of sleep, and a lower quality of life. What are the causes? The most common cause of this condition is diabetes. Many other diseases and conditions can also cause neuropathic pain. Causes of  neuropathic pain can be classified as: Toxic. This is caused by medicines and chemicals. The most common causes of toxic neuropathic pain is damage from medicines that kill cancer cells (chemotherapy) or alcohol abuse. Metabolic. This can be caused by: Diabetes. Lack of vitamins like B12. Traumatic. Any injury that cuts, crushes, or stretches a nerve can cause damage and pain. Compression-related. If a sensory nerve gets trapped or compressed for a long period of time, the blood supply to the nerve can be cut off. Vascular. Many blood vessel diseases can cause neuropathic pain by decreasing blood supply and oxygen to nerves. Autoimmune. This type of pain results from diseases in which the body's defense system (immune system) mistakenly attacks sensory nerves. Examples of autoimmune diseases that can cause neuropathic pain include lupus and multiple sclerosis. Infectious. Many types of viral infections can damage sensory nerves and cause pain. Shingles infection is a common cause of this type of pain. Inherited. Neuropathic pain can be a symptom of many diseases that are passed down through families (genetic). What increases the risk? You are more likely to develop this condition if: You have diabetes. You smoke. You drink too much alcohol. You are taking certain medicines, including chemotherapy or medicines that treat immune system disorders. What are the signs or symptoms? The main symptom is pain. Neuropathic pain is often described as: Burning. Shock-like. Stinging. Hot or cold. Itching. How is this diagnosed? No single test can diagnose neuropathic pain. It is diagnosed based on: A physical exam and your symptoms. Your health care provider will ask you  about your pain. You may be asked to use a pain scale to describe how bad your pain is. Tests. These may be done to see if you have a cause and location of any nerve damage. They include: Nerve conduction studies and electromyography  to test how well nerve signals travel through your nerves and muscles (electrodiagnostic testing). Skin biopsy to evaluate for small fiber neuropathy. Imaging studies, such as: X-rays. CT scan. MRI. How is this treated? Treatment for neuropathic pain may change over time. You may need to try different treatment options or a combination of treatments. Some options include: Treating the underlying cause of the neuropathy, such as diabetes, kidney disease, or vitamin deficiencies. Stopping medicines that can cause neuropathy, such as chemotherapy. Medicine to relieve pain. Medicines may include: Prescription or over-the-counter pain medicine. Anti-seizure medicine. Antidepressant medicines. Pain-relieving patches or creams that are applied to painful areas of skin. A medicine to numb the area (local anesthetic), which can be injected as a nerve block. Transcutaneous nerve stimulation. This uses electrical currents to block painful nerve signals. The treatment is painless. Alternative treatments, such as: Acupuncture. Meditation. Massage. Occupational or physical therapy. Pain management programs. Counseling. Follow these instructions at home: Medicines  Take over-the-counter and prescription medicines only as told by your health care provider. Ask your health care provider if the medicine prescribed to you: Requires you to avoid driving or using machinery. Can cause constipation. You may need to take these actions to prevent or treat constipation: Drink enough fluid to keep your urine pale yellow. Take over-the-counter or prescription medicines. Eat foods that are high in fiber, such as beans, whole grains, and fresh fruits and vegetables. Limit foods that are high in fat and processed sugars, such as fried or sweet foods. Lifestyle  Have a good support system at home. Consider joining a chronic pain support group. Do not use any products that contain nicotine or tobacco. These  products include cigarettes, chewing tobacco, and vaping devices, such as e-cigarettes. If you need help quitting, ask your health care provider. Do not drink alcohol. General instructions Learn as much as you can about your condition. Work closely with all your health care providers to find the treatment plan that works best for you. Ask your health care provider what activities are safe for you. Keep all follow-up visits. This is important. Contact a health care provider if: Your pain treatments are not working. You are having side effects from your medicines. You are struggling with tiredness (fatigue), mood changes, depression, or anxiety. Get help right away if: You have thoughts of hurting yourself. Get help right away if you feel like you may hurt yourself or others, or have thoughts about taking your own life. Go to your nearest emergency room or: Call 911. Call the National Suicide Prevention Lifeline at (249)398-6260 or 988. This is open 24 hours a day. Text the Crisis Text Line at 641-793-0439. Summary Neuropathic pain is pain caused by damage to the nerves that are responsible for certain sensations in your body (sensory nerves). Neuropathic pain may come and go as damaged nerves heal, or it may stay at the same level for years. Neuropathic pain is usually a long-term condition that can be difficult to treat. Consider joining a chronic pain support group. This information is not intended to replace advice given to you by your health care provider. Make sure you discuss any questions you have with your health care provider. Document Revised: 10/04/2020 Document Reviewed: 10/04/2020 [employer] Patient Education  2024 [employer]

## 2022-12-04 ENCOUNTER — Ambulatory Visit: Payer: Medicare Other | Admitting: Nurse Practitioner

## 2022-12-04 ENCOUNTER — Encounter: Payer: Self-pay | Admitting: Nurse Practitioner

## 2022-12-04 VITALS — BP 114/67 | HR 68 | Temp 97.7°F | Ht 72.0 in | Wt 184.2 lb

## 2022-12-04 DIAGNOSIS — E1140 Type 2 diabetes mellitus with diabetic neuropathy, unspecified: Secondary | ICD-10-CM

## 2022-12-04 DIAGNOSIS — Z7984 Long term (current) use of oral hypoglycemic drugs: Secondary | ICD-10-CM

## 2022-12-04 DIAGNOSIS — Z0000 Encounter for general adult medical examination without abnormal findings: Secondary | ICD-10-CM

## 2022-12-04 DIAGNOSIS — E538 Deficiency of other specified B group vitamins: Secondary | ICD-10-CM | POA: Diagnosis not present

## 2022-12-04 DIAGNOSIS — I739 Peripheral vascular disease, unspecified: Secondary | ICD-10-CM | POA: Diagnosis not present

## 2022-12-04 DIAGNOSIS — E785 Hyperlipidemia, unspecified: Secondary | ICD-10-CM

## 2022-12-04 DIAGNOSIS — Z23 Encounter for immunization: Secondary | ICD-10-CM

## 2022-12-04 DIAGNOSIS — E1159 Type 2 diabetes mellitus with other circulatory complications: Secondary | ICD-10-CM

## 2022-12-04 DIAGNOSIS — E1169 Type 2 diabetes mellitus with other specified complication: Secondary | ICD-10-CM

## 2022-12-04 DIAGNOSIS — Z8546 Personal history of malignant neoplasm of prostate: Secondary | ICD-10-CM

## 2022-12-04 DIAGNOSIS — I152 Hypertension secondary to endocrine disorders: Secondary | ICD-10-CM

## 2022-12-04 MED ORDER — EMPAGLIFLOZIN 10 MG PO TABS: 10.0000 mg | ORAL_TABLET | Freq: Every day | ORAL | 4 refills | Status: DC

## 2022-12-04 MED ORDER — ATENOLOL 50 MG PO TABS
50.0000 mg | ORAL_TABLET | Freq: Every day | ORAL | 4 refills | Status: DC
Start: 2022-12-04 — End: 2024-01-03

## 2022-12-04 MED ORDER — HYDROCHLOROTHIAZIDE 12.5 MG PO TABS: 12.5000 mg | ORAL_TABLET | Freq: Every day | ORAL | 4 refills | Status: DC

## 2022-12-04 MED ORDER — BENAZEPRIL HCL 40 MG PO TABS
40.0000 mg | ORAL_TABLET | Freq: Every day | ORAL | 4 refills | Status: DC
Start: 2022-12-04 — End: 2023-10-10

## 2022-12-04 MED ORDER — ROSUVASTATIN CALCIUM 10 MG PO TABS: ORAL_TABLET | ORAL | 4 refills | Status: DC

## 2022-12-04 NOTE — Assessment & Plan Note (Signed)
Chronic, ongoing.  Myalgia presenting with daily Atorvastatin in past.  Continue Rosuvastatin 10 MG three days a week, educated him on this.  If myalgia with 3 days a week dosing then will trial one day a week, if ongoing myalgia then stop and consider Zetia or injectable.

## 2022-12-04 NOTE — Assessment & Plan Note (Signed)
Chronic, ongoing with A1c 7.5% recently with neurology, urine ALB 30 in April 2024.  Continue Metformin TID and restart Jardiance at 10 MG daily, he tolerated this in past.  Recommend he check BS at least three mornings a week and document.  If elevation above goal A1c next visit then increase Jardiance.  Continue PT for neuropathy as offering benefit and visits with neurology.  Return in 3 months for A1c check due to trend up.

## 2022-12-04 NOTE — Assessment & Plan Note (Addendum)
Ongoing, stable on recent check.  B12 level up to date, continue supplement daily.

## 2022-12-04 NOTE — Assessment & Plan Note (Signed)
Suspect some underlying PVD based on exam with diminished lower extremity hair pattern, edema at ankles, and dry skin.  At this time monitor for wounds and continue frequent foot exams.  To notify provider if any wounds present or pain.

## 2022-12-04 NOTE — Assessment & Plan Note (Signed)
Recent visit with Dr. Apolinar Junes and is cancer free.  Continue collaboration with urology and PSA with them.

## 2022-12-04 NOTE — Progress Notes (Signed)
BP 114/67 (BP Location: Left Arm, Patient Position: Sitting, Cuff Size: Large)   Pulse 68   Temp 97.7 F (36.5 C) (Oral)   Ht 6' (1.829 m)   Wt 184 lb 3.2 oz (83.6 kg)   SpO2 95%   BMI 24.98 kg/m    Subjective:    Patient ID: Tyler Morgan, male    DOB: [birth year], 77 y.o.   MRN: [id number]  HPI: Tyler Morgan is a 77 y.o. male presenting on 12/04/2022 for Medicare Wellness and follow-up. Current medical complaints include:none  He currently lives with: wife Interim Problems from his last visit: no   DIABETES A1c September 7.5% with neurology, he did not want to add on medication last visit.  Continues Metformin 500 MG TID.  Jardiance in past, on for less then a year -- he tolerated this.    Continues with physical therapy, last on 11/29/22, goes twice a week.  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 Takes Alpha Lipoic Acid and B12 daily. Saw Dr. Sherryll Burger on 11/15/22 -- is to have nerve testing. Hypoglycemic episodes:no Polydipsia/polyuria: no Visual disturbance: no Chest pain: no Paresthesias: no Glucose Monitoring: no  Accucheck frequency: Not Checking  Fasting glucose:  Post prandial:  Evening:  Before meals: Taking Insulin?: no  Long acting insulin:  Short acting insulin: Blood Pressure Monitoring: rarely Retinal Examination: Up To Date - No Name Eye  Foot Exam: Up to Date Pneumovax: Up to Date Influenza: Up to Date Aspirin: no   HYPERTENSION / HYPERLIPIDEMIA Continues on Benazepril, Atenolol, HCTZ + Crestor.   Satisfied with current treatment? yes Duration of hypertension: chronic BP monitoring frequency: not checking BP range:  BP medication side effects: no Duration of hyperlipidemia: chronic Cholesterol medication side effects: no Cholesterol supplements: none Medication compliance: good compliance Aspirin: no Recent stressors: no Recurrent headaches: no Visual changes: no Palpitations: no Dyspnea:  no Chest pain: no Lower extremity edema: no Dizzy/lightheaded: no   PROSTATE CANCER: Followed by Dr. Apolinar Junes with urology and last saw 01/11/22. Was told he was cancer free in 2021.  Continues to have monitoring of PSA, every 6 months, recent May was 1.9. Denies any symptoms at this time.      Functional Status Survey: Is the patient deaf or have difficulty hearing?: No Does the patient have difficulty seeing, even when wearing glasses/contacts?: No Does the patient have difficulty concentrating, remembering, or making decisions?: No Does the patient have difficulty walking or climbing stairs?: No Does the patient have difficulty dressing or bathing?: No Does the patient have difficulty doing errands alone such as visiting a doctor's office or shopping?: No  FALL RISK:    09/01/2022    9:35 AM 06/02/2022    3:54 PM 06/02/2022    3:53 PM 11/30/2021    4:25 PM 11/30/2021    3:53 PM  Fall Risk   Falls in the past year? 1 1 0 1 0  Number falls in past yr: 0 1 0 0 0  Injury with Fall? 0 0 0 0 0  Risk for fall due to : Impaired balance/gait;History of fall(s);Impaired mobility No Fall Risks No Fall Risks History of fall(s) No Fall Risks  Follow up Falls evaluation completed Falls evaluation completed  Falls prevention discussed Falls evaluation completed    Depression Screen    12/04/2022    4:04 PM 09/01/2022    9:36 AM 06/02/2022    3:54 PM 11/30/2021    3:54 PM 11/14/2021  9:40 AM  Depression screen PHQ 2/9  Decreased Interest 0 0 0 0 0  Down, Depressed, Hopeless 0 0 0 0 0  PHQ - 2 Score 0 0 0 0 0  Altered sleeping 0 0 0 0 0  Tired, decreased energy 0 0 0 0 0  Change in appetite 0 0 0 0 0  Feeling bad or failure about yourself  0 0 0 0 0  Trouble concentrating 0 0 0 0 0  Moving slowly or fidgety/restless 0 0 0 0 0  Suicidal thoughts 0 0 0 0 0  PHQ-9 Score 0 0 0 0 0  Difficult doing work/chores   Not difficult at all Not difficult at all Not difficult at all       12/04/2022    4:04 PM 09/01/2022    9:37 AM 06/02/2022    3:54 PM 11/30/2021    3:54 PM  GAD 7 : Generalized Anxiety Score  Nervous, Anxious, on Edge 0 0 0 0  Control/stop worrying 0 0 0 0  Worry too much - different things 0 0 0 0  Trouble relaxing 0 0 0 0  Restless 0 0 0 0  Easily annoyed or irritable 0 0 0 0  Afraid - awful might happen 0 0 0 0  Total GAD 7 Score 0 0 0 0  Anxiety Difficulty    Not difficult at all   Past Medical History:  Past Medical History:  Diagnosis Date   Diabetes mellitus without complication (HCC)    ED (erectile dysfunction)    Hypertension    Motion sickness    boats   Neuropathy    Prostate enlargement    Urinary retention     Surgical History:  Past Surgical History:  Procedure Laterality Date   BASAL CELL CARCINOMA EXCISION     nose    CATARACT EXTRACTION Bilateral 1986, 2014   COLONOSCOPY WITH PROPOFOL N/A 03/27/2018   Procedure: COLONOSCOPY WITH BIOPSIES;  Surgeon: Toney Reil, MD;  Location: [facility];  Service: Endoscopy;  Laterality: N/A;  Diabetic - oral meds   EYE SURGERY Bilateral [phone]   FRACTURE SURGERY Right    as a child/ right arm   HERNIA REPAIR Right 01-13-14   inguinal hernia   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 N/A 02/07/2017   Procedure: 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;  Surgeon: Vanna Scotland, MD;  Location: [facility];  Service: Urology;  Laterality: N/A;   POLYPECTOMY N/A 03/27/2018   Procedure: POLYPECTOMY;  Surgeon: Toney Reil, MD;  Location: [facility];  Service: Endoscopy;  Laterality: N/A;   RETINAL DETACHMENT SURGERY  Jan 2015   TONSILLECTOMY  1955    Medications:  Current Outpatient Medications on File Prior to Visit  Medication Sig   Alpha-Lipoic Acid 50 MG TABS Take by mouth.   aspirin EC 81 MG tablet Take 81 mg by mouth daily. Swallow whole.   metFORMIN (GLUCOPHAGE) 500 MG tablet TAKE 1 TABLET BY MOUTH THREE TIMES  DAILY   vitamin B-12 (CYANOCOBALAMIN) 1000 MCG tablet Take 1,000 mcg by mouth daily.   VITAMIN D PO Take 500 Units by mouth daily.   No current facility-administered medications on file prior to visit.    Allergies:  Allergies  Allergen Reactions   Acetaminophen Swelling and Other (See Comments)    Swelling of liver    Amlodipine Swelling    Social History:  Social History   Socioeconomic History   Marital status:  Married    Spouse name: Not on file   Number of children: Not on file   Years of education: Not on file   Highest education level: Bachelor's degree (e.g., BA, AB, BS)  Occupational History   Not on file  Tobacco Use   Smoking status: Former    Current packs/day: 0.00    Average packs/day: 1 pack/day for 13.0 years (13.0 ttl pk-yrs)    Types: Cigarettes    Start date: 02/21/1971    Quit date: 02/21/1984    Years since quitting: 38.8   Smokeless tobacco: Never  Vaping Use   Vaping status: Never Used  Substance and Sexual Activity   Alcohol use: Yes    Alcohol/week: 2.0 standard drinks of alcohol    Types: 2 Cans of beer per week    Comment: wine occasionally    Drug use: No   Sexual activity: Not on file  Other Topics Concern   Not on file  Social History Narrative   Working fulltime    Social Determinants of Health   Financial Resource Strain: Low Risk  (12/04/2022)   Overall Financial Resource Strain (CARDIA)    Difficulty of Paying Living Expenses: Not hard at all  Food Insecurity: No Food Insecurity (12/04/2022)   Hunger Vital Sign    Worried About Running Out of Food in the Last Year: Never true    Ran Out of Food in the Last Year: Never true  Transportation Needs: No Transportation Needs (12/04/2022)   PRAPARE - Administrator, Civil Service (Medical): No    Lack of Transportation (Non-Medical): No  Physical Activity: Sufficiently Active (12/04/2022)   Exercise Vital Sign    Days of Exercise per Week: 5 days    Minutes of Exercise  per Session: 30 min  Stress: No Stress Concern Present (12/04/2022)   [employer] of Occupational Health - Occupational Stress Questionnaire    Feeling of Stress : Not at all  Social Connections: Moderately Isolated (12/04/2022)   Social Connection and Isolation Panel [NHANES]    Frequency of Communication with Friends and Family: More than three times a week    Frequency of Social Gatherings with Friends and Family: More than three times a week    Attends Religious Services: Never    Database administrator or Organizations: No    Attends Banker Meetings: Never    Marital Status: Married  Catering manager Violence: Not At Risk (12/04/2022)   Humiliation, Afraid, Rape, and Kick questionnaire    Fear of Current or Ex-Partner: No    Emotionally Abused: No    Physically Abused: No    Sexually Abused: No   Social History   Tobacco Use  Smoking Status Former   Current packs/day: 0.00   Average packs/day: 1 pack/day for 13.0 years (13.0 ttl pk-yrs)   Types: Cigarettes   Start date: 02/21/1971   Quit date: 02/21/1984   Years since quitting: 38.8  Smokeless Tobacco Never   Social History   Substance and Sexual Activity  Alcohol Use Yes   Alcohol/week: 2.0 standard drinks of alcohol   Types: 2 Cans of beer per week   Comment: wine occasionally     Family History:  Family History  Problem Relation Age of Onset   Colon cancer Father    Angina Father    Macular degeneration Father    Alzheimer's disease Mother    Prostate cancer Neg Hx    Bladder Cancer Neg Hx  Kidney cancer Neg Hx     Past medical history, surgical history, medications, allergies, family history and social history reviewed with patient today and changes made to appropriate areas of the chart.   Review of Systems - negative All other ROS negative except what is listed above and in the HPI.      Objective:    BP 114/67 (BP Location: Left Arm, Patient Position: Sitting, Cuff Size: Large)    Pulse 68   Temp 97.7 F (36.5 C) (Oral)   Ht 6' (1.829 m)   Wt 184 lb 3.2 oz (83.6 kg)   SpO2 95%   BMI 24.98 kg/m   Wt Readings from Last 3 Encounters:  12/04/22 184 lb 3.2 oz (83.6 kg)  09/29/22 184 lb 9.6 oz (83.7 kg)  09/01/22 184 lb 6.4 oz (83.6 kg)    Physical Exam Vitals and nursing note reviewed.  Constitutional:      General: He is awake. He is not in acute distress.    Appearance: He is well-developed and well-groomed. He is not ill-appearing.  HENT:     Head: Normocephalic and atraumatic.     Right Ear: Hearing, ear canal and external ear normal. No drainage.     Left Ear: Hearing, ear canal and external ear normal. No drainage.     Nose: Nose normal.     Mouth/Throat:     Pharynx: Uvula midline.  Eyes:     General: Lids are normal.        Right eye: No discharge.        Left eye: No discharge.     Extraocular Movements: Extraocular movements intact.     Conjunctiva/sclera: Conjunctivae normal.     Pupils: Pupils are equal, round, and reactive to light.     Visual Fields: Right eye visual fields normal and left eye visual fields normal.  Neck:     Thyroid: No thyromegaly.     Vascular: No carotid bruit or JVD.     Trachea: Trachea normal.  Cardiovascular:     Rate and Rhythm: Normal rate and regular rhythm.     Heart sounds: Normal heart sounds, S1 normal and S2 normal. No murmur heard.    No gallop.  Pulmonary:     Effort: Pulmonary effort is normal. No accessory muscle usage or respiratory distress.     Breath sounds: Normal breath sounds.  Abdominal:     General: Bowel sounds are normal.     Palpations: Abdomen is soft. There is no hepatomegaly or splenomegaly.     Tenderness: There is no abdominal tenderness.  Musculoskeletal:        General: Normal range of motion.     Cervical back: Normal range of motion and neck supple.     Right lower leg: Edema (trace) present.     Left lower leg: Edema (trace) present.  Lymphadenopathy:     Head:      Right side of head: No submental, submandibular, tonsillar, preauricular or posterior auricular adenopathy.     Left side of head: No submental, submandibular, tonsillar, preauricular or posterior auricular adenopathy.     Cervical: No cervical adenopathy.  Skin:    General: Skin is warm and dry.     Capillary Refill: Capillary refill takes less than 2 seconds.     Findings: No rash.  Neurological:     Mental Status: He is alert and oriented to person, place, and time.     Gait: Gait is intact.  Deep Tendon Reflexes: Reflexes are normal and symmetric.     Reflex Scores:      Brachioradialis reflexes are 2+ on the right side and 2+ on the left side.      Patellar reflexes are 2+ on the right side and 2+ on the left side. Psychiatric:        Attention and Perception: Attention normal.        Mood and Affect: Mood normal.        Speech: Speech normal.        Behavior: Behavior normal. Behavior is cooperative.        Thought Content: Thought content normal.        Cognition and Memory: Cognition normal.        Judgment: Judgment normal.    Diabetic Foot Exam - Simple   Simple Foot Form Visual Inspection No deformities, no ulcerations, no other skin breakdown bilaterally: Yes Sensation Testing See comments: Yes Pulse Check Posterior Tibialis and Dorsalis pulse intact bilaterally: Yes Comments Reduced sensation bilaterally       12/04/2022    4:31 PM 11/14/2021    9:46 AM 11/12/2020   10:39 AM 11/10/2019   10:36 AM 09/19/2017   10:07 AM  6CIT Screen  What Year? 0 points 0 points 0 points 0 points 0 points  What month? 0 points 0 points 0 points 0 points 0 points  What time? 0 points 0 points 0 points 0 points 0 points  Count back from 20 0 points 0 points 0 points 0 points 0 points  Months in reverse 0 points 0 points 0 points 0 points 0 points  Repeat phrase 0 points 0 points 2 points 2 points 0 points  Total Score 0 points 0 points 2 points 2 points 0 points   Results  for orders placed or performed in visit on 09/29/22  Basic metabolic panel  Result Value Ref Range   Glucose 143 (H) 70 - 99 mg/dL   BUN 22 8 - 27 mg/dL   Creatinine, Ser 2.54 (L) 0.76 - 1.27 mg/dL   eGFR 95 >27 CW/CBJ/6.28   BUN/Creatinine Ratio 31 (H) 10 - 24   Sodium 141 134 - 144 mmol/L   Potassium 3.8 3.5 - 5.2 mmol/L   Chloride 102 96 - 106 mmol/L   CO2 22 20 - 29 mmol/L   Calcium 9.3 8.6 - 10.2 mg/dL      Assessment & Plan:   Problem List Items Addressed This Visit       Cardiovascular and Mediastinum   Hypertension associated with diabetes (HCC)    Chronic, stable.  BP well at goal in office today.  Urine ALB 20 June 2022.  Continue current medication regimen at this time, Benazepril offering kidney protection + Hydrochlorothiazide 12.5 MG daily which has helped lower BP.  Can not take Amlodipine, although this would offer benefit.  Recommend checking BP a few mornings a week at home and documenting + focus on DASH diet.  Monitor for worsening edema and may provide PRN Lasix if presents, suspect some underlying PVD based on exam.  Recommend avoid Ibuprofen products and high salt intake.  Return in 3 months.  Labs today: CBC, CMP.      Relevant Medications   aspirin EC 81 MG tablet   empagliflozin (JARDIANCE) 10 MG TABS tablet   rosuvastatin (CRESTOR) 10 MG tablet   hydrochlorothiazide (HYDRODIURIL) 12.5 MG tablet   benazepril (LOTENSIN) 40 MG tablet   atenolol (TENORMIN) 50 MG  tablet   Other Relevant Orders   CBC with Differential/Platelet   PVD (peripheral vascular disease) (HCC)    Suspect some underlying PVD based on exam with diminished lower extremity hair pattern, edema at ankles, and dry skin.  At this time monitor for wounds and continue frequent foot exams.  To notify provider if any wounds present or pain.      Relevant Medications   aspirin EC 81 MG tablet   rosuvastatin (CRESTOR) 10 MG tablet   hydrochlorothiazide (HYDRODIURIL) 12.5 MG tablet    benazepril (LOTENSIN) 40 MG tablet   atenolol (TENORMIN) 50 MG tablet     Endocrine   Hyperlipidemia associated with type 2 diabetes mellitus (HCC)    Chronic, ongoing.  Myalgia presenting with daily Atorvastatin in past.  Continue Rosuvastatin 10 MG three days a week, educated him on this.  If myalgia with 3 days a week dosing then will trial one day a week, if ongoing myalgia then stop and consider Zetia or injectable.      Relevant Medications   aspirin EC 81 MG tablet   empagliflozin (JARDIANCE) 10 MG TABS tablet   rosuvastatin (CRESTOR) 10 MG tablet   hydrochlorothiazide (HYDRODIURIL) 12.5 MG tablet   benazepril (LOTENSIN) 40 MG tablet   atenolol (TENORMIN) 50 MG tablet   Other Relevant Orders   Comprehensive metabolic panel   Lipid Panel w/o Chol/HDL Ratio   Type 2 diabetes mellitus with diabetic neuropathy (HCC)    Chronic, ongoing with A1c 7.5% recently with neurology, urine ALB 30 in April 2024.  Continue Metformin TID and restart Jardiance at 10 MG daily, he tolerated this in past.  Recommend he check BS at least three mornings a week and document.  If elevation above goal A1c next visit then increase Jardiance.  Continue PT for neuropathy as offering benefit and visits with neurology.  Return in 3 months for A1c check due to trend up.      Relevant Medications   aspirin EC 81 MG tablet   empagliflozin (JARDIANCE) 10 MG TABS tablet   rosuvastatin (CRESTOR) 10 MG tablet   benazepril (LOTENSIN) 40 MG tablet     Other   B12 deficiency    Ongoing, stable on recent check.  B12 level up to date, continue supplement daily.      History of prostate cancer    Recent visit with Dr. Apolinar Junes and is cancer free.  Continue collaboration with urology and PSA with them.      Other Visit Diagnoses     Medicare annual wellness visit, subsequent    -  Primary   Medicare Wellness due and performed today.       Discussed aspirin prophylaxis for myocardial infarction prevention and  decision was that we recommended ASA, and patient refused  LABORATORY TESTING:  Health maintenance labs ordered today as discussed above.   The natural history of prostate cancer and ongoing controversy regarding screening and potential treatment outcomes of prostate cancer has been discussed with the patient. The meaning of a false positive PSA and a false negative PSA has been discussed. He indicates understanding of the limitations of this screening test and wishes to proceed with screening PSA testing -- has performed with urology.  IMMUNIZATIONS:   - Tdap: Tetanus vaccination status reviewed: last tetanus booster within 10 years. - Influenza: Up to date -- provided today - Pneumovax: Up to date - Prevnar: Up to date - Zostavax vaccine: Up to date - Covid vaccines = Up To Date  SCREENING: - Colonoscopy: Up to date  Discussed with patient purpose of the colonoscopy is to detect colon cancer at curable precancerous or early stages   - AAA Screening: Not applicable  -Hearing Test: Not applicable  -Spirometry: Not applicable   PATIENT COUNSELING:    Sexuality: Discussed sexually transmitted diseases, partner selection, use of condoms, avoidance of unintended pregnancy  and contraceptive alternatives.   Advised to avoid cigarette smoking.  I discussed with the patient that most people either abstain from alcohol or drink within safe limits (<=14/week and <=4 drinks/occasion for males, <=7/weeks and <= 3 drinks/occasion for females) and that the risk for alcohol disorders and other health effects rises proportionally with the number of drinks per week and how often a drinker exceeds daily limits.  Discussed cessation/primary prevention of drug use and availability of treatment for abuse.   Diet: Encouraged to adjust caloric intake to maintain  or achieve ideal body weight, to reduce intake of dietary saturated fat and total fat, to limit sodium intake by avoiding high sodium foods and  not adding table salt, and to maintain adequate dietary potassium and calcium preferably from fresh fruits, vegetables, and low-fat dairy products.    Stressed the importance of regular exercise  Injury prevention: Discussed safety belts, safety helmets, smoke detector, smoking near bedding or upholstery.   Dental health: Discussed importance of regular tooth brushing, flossing, and dental visits.   Follow up plan: NEXT PREVENTATIVE PHYSICAL DUE IN 1 YEAR. Return in about 3 months (around 03/06/2023) for T2DM, HTN/HLD, NEUROPATHY.

## 2022-12-04 NOTE — Telephone Encounter (Signed)
Requested Prescriptions  Pending Prescriptions Disp Refills   metFORMIN (GLUCOPHAGE) 500 MG tablet [Pharmacy Med Name: METFORMIN HCL 500 MG TAB] 270 tablet 1    Sig: TAKE 1 TABLET BY MOUTH THREE TIMES DAILY     Endocrinology:  Diabetes - Biguanides Failed - 12/03/2022  5:21 PM      Failed - Cr in normal range and within 360 days    Creatinine  Date Value Ref Range Status  01/05/2014 0.75 0.60 - 1.30 mg/dL Final   Creatinine, Ser  Date Value Ref Range Status  09/29/2022 0.72 (L) 0.76 - 1.27 mg/dL Final         Failed - CBC within normal limits and completed in the last 12 months    WBC  Date Value Ref Range Status  11/30/2021 5.5 3.4 - 10.8 x10E3/uL Final  01/30/2017 5.3 3.8 - 10.6 K/uL Final   RBC  Date Value Ref Range Status  11/30/2021 4.92 4.14 - 5.80 x10E6/uL Final  01/30/2017 5.16 4.40 - 5.90 MIL/uL Final   Hemoglobin  Date Value Ref Range Status  11/30/2021 14.1 13.0 - 17.7 g/dL Final   Hematocrit  Date Value Ref Range Status  11/30/2021 41.6 37.5 - 51.0 % Final   MCHC  Date Value Ref Range Status  11/30/2021 33.9 31.5 - 35.7 g/dL Final  16/11/9602 54.0 32.0 - 36.0 g/dL Final   [facility]  Date Value Ref Range Status  11/30/2021 28.7 26.6 - 33.0 pg Final  01/30/2017 29.0 26.0 - 34.0 pg Final   MCV  Date Value Ref Range Status  11/30/2021 85 79 - 97 fL Final  01/05/2014 86 80 - 100 fL Final   No results found for: "PLTCOUNTKUC", "LABPLAT", "POCPLA" RDW  Date Value Ref Range Status  11/30/2021 12.9 11.6 - 15.4 % Final  01/05/2014 13.4 11.5 - 14.5 % Final         Passed - HBA1C is between 0 and 7.9 and within 180 days    Hemoglobin A1C  Date Value Ref Range Status  07/21/2015 8.2  Final   HB A1C (BAYER DCA - WAIVED)  Date Value Ref Range Status  09/01/2022 7.0 (H) 4.8 - 5.6 % Final    Comment:             Prediabetes: 5.7 - 6.4          Diabetes: >6.4          Glycemic control for adults with diabetes: <7.0          Passed - eGFR in normal range  and within 360 days    EGFR (African American)  Date Value Ref Range Status  01/05/2014 >60 >20mL/min Final   GFR calc Af Amer  Date Value Ref Range Status  03/04/2020 103 >59 mL/min/1.73 Final    Comment:    **In accordance with recommendations from the NKF-ASN Task force,**   [facility] is in the process of updating its eGFR calculation to the   2021 CKD-EPI creatinine equation that estimates kidney function   without a race variable.    EGFR (Non-African Amer.)  Date Value Ref Range Status  01/05/2014 >60 >50mL/min Final    Comment:    eGFR values <93mL/min/1.73 m2 may be an indication of chronic kidney disease (CKD). Calculated eGFR, using the MRDR Study equation, is useful in  patients with stable renal function. The eGFR calculation will not be reliable in acutely ill patients when serum creatinine is changing rapidly. It is not useful in patients  on dialysis. The eGFR calculation may not be applicable to patients at the low and high extremes of body sizes, pregnant women, and vegetarians.    GFR calc non Af Amer  Date Value Ref Range Status  03/04/2020 89 >59 mL/min/1.73 Final   eGFR  Date Value Ref Range Status  09/29/2022 95 >59 mL/min/1.73 Final         Passed - B12 Level in normal range and within 720 days    Vitamin B-12  Date Value Ref Range Status  11/30/2021 852 232 - 1,245 pg/mL Final         Passed - Valid encounter within last 6 months    Recent Outpatient Visits           2 months ago Hypertension associated with diabetes (HCC)   [place] [facility] [place], Jolene T, NP   3 months ago Type 2 diabetes mellitus with diabetic neuropathy, without long-term current use of insulin (HCC)   [place] [facility] [place], Chino T, NP   6 months ago Type 2 diabetes mellitus with diabetic neuropathy, without long-term current use of insulin (HCC)   [place] [facility] [place], Richland T, NP   1 year ago Type  2 diabetes mellitus with diabetic neuropathy, without long-term current use of insulin (HCC)   [place] [facility] [place], [place] T, NP   1 year ago Type 2 diabetes mellitus with diabetic neuropathy, without long-term current use of insulin (HCC)   [place] [facility] [place], [place] T, NP       Future Appointments             Today [name], NP [place] [facility]   In 1 month Vanna Scotland, MD [facility] [place]             atenolol (TENORMIN) 50 MG tablet [Pharmacy Med Name: ATENOLOL 50 MG TAB] 90 tablet 1    Sig: TAKE 1 TABLET BY MOUTH DAILY     Cardiovascular: Beta Blockers 2 Failed - 12/03/2022  5:21 PM      Failed - Cr in normal range and within 360 days    Creatinine  Date Value Ref Range Status  01/05/2014 0.75 0.60 - 1.30 mg/dL Final   Creatinine, Ser  Date Value Ref Range Status  09/29/2022 0.72 (L) 0.76 - 1.27 mg/dL Final         Passed - Last BP in normal range    BP Readings from Last 1 Encounters:  09/29/22 120/64         Passed - Last Heart Rate in normal range    Pulse Readings from Last 1 Encounters:  09/29/22 70         Passed - Valid encounter within last 6 months    Recent Outpatient Visits           2 months ago Hypertension associated with diabetes (HCC)   [place] [facility] [place], Pelkie T, NP   3 months ago Type 2 diabetes mellitus with diabetic neuropathy, without long-term current use of insulin (HCC)   [place] [facility] [place], [place] T, NP   6 months ago Type 2 diabetes mellitus with diabetic neuropathy, without long-term current use of insulin (HCC)   [place] [facility] [place], [place] T, NP   1 year ago Type 2 diabetes mellitus with diabetic neuropathy, without long-term current use of insulin (HCC)  [place] [facility] [place], Corrie Dandy T, NP   1 year ago Type 2 diabetes mellitus  with diabetic neuropathy, without long-term current use of insulin (HCC)   [place] [facility] [place], [place] T, NP       Future Appointments             Today [name], NP [place] [facility], [facility]   In 1 month Vanna Scotland, MD [facility] [place]             benazepril (LOTENSIN) 40 MG tablet [Pharmacy Med Name: BENAZEPRIL HCL 40 MG TAB] 90 tablet 1    Sig: TAKE 1 TABLET BY MOUTH DAILY     Cardiovascular:  ACE Inhibitors Failed - 12/03/2022  5:21 PM      Failed - Cr in normal range and within 180 days    Creatinine  Date Value Ref Range Status  01/05/2014 0.75 0.60 - 1.30 mg/dL Final   Creatinine, Ser  Date Value Ref Range Status  09/29/2022 0.72 (L) 0.76 - 1.27 mg/dL Final         Passed - K in normal range and within 180 days    Potassium  Date Value Ref Range Status  09/29/2022 3.8 3.5 - 5.2 mmol/L Final  01/05/2014 4.0 3.5 - 5.1 mmol/L Final         Passed - Patient is not pregnant      Passed - Last BP in normal range    BP Readings from Last 1 Encounters:  09/29/22 120/64         Passed - Valid encounter within last 6 months    Recent Outpatient Visits           2 months ago Hypertension associated with diabetes (HCC)   [place] [facility] [place], [place] T, NP   3 months ago Type 2 diabetes mellitus with diabetic neuropathy, without long-term current use of insulin (HCC)   [place] [facility] [place], [place] T, NP   6 months ago Type 2 diabetes mellitus with diabetic neuropathy, without long-term current use of insulin (HCC)   [place] [facility] [place], [place] T, NP   1 year ago Type 2 diabetes mellitus with diabetic neuropathy, without long-term current use of insulin (HCC)   [place] [facility] [place], [place] T, NP   1 year ago Type 2 diabetes mellitus with diabetic neuropathy, without long-term current use of insulin (HCC)    [place] [facility] [place], Dorie Rank, NP       Future Appointments             Today [name], NP [place] [facility] [place], [facility]   In 1 month Vanna Scotland, MD [facility] [place]             hydrochlorothiazide (HYDRODIURIL) 12.5 MG tablet [Pharmacy Med Name: HYDROCHLOROTHIAZIDE 12.5 MG TAB] 45 tablet 1    Sig: TAKE ONE TABLET BY MOUTH EVERY DAY     Cardiovascular: Diuretics - Thiazide Failed - 12/03/2022  5:21 PM      Failed - Cr in normal range and within 180 days    Creatinine  Date Value Ref Range Status  01/05/2014 0.75 0.60 - 1.30 mg/dL Final   Creatinine, Ser  Date Value Ref Range Status  09/29/2022 0.72 (L) 0.76 - 1.27 mg/dL Final         Passed - K in  normal range and within 180 days    Potassium  Date Value Ref Range Status  09/29/2022 3.8 3.5 - 5.2 mmol/L Final  01/05/2014 4.0 3.5 - 5.1 mmol/L Final         Passed - Na in normal range and within 180 days    Sodium  Date Value Ref Range Status  09/29/2022 141 134 - 144 mmol/L Final  01/05/2014 140 136 - 145 mmol/L Final         Passed - Last BP in normal range    BP Readings from Last 1 Encounters:  09/29/22 120/64         Passed - Valid encounter within last 6 months    Recent Outpatient Visits           2 months ago Hypertension associated with diabetes (HCC)   [place] [facility] [place], [place] T, NP   3 months ago Type 2 diabetes mellitus with diabetic neuropathy, without long-term current use of insulin (HCC)   [place] [facility] [place], [place] T, NP   6 months ago Type 2 diabetes mellitus with diabetic neuropathy, without long-term current use of insulin (HCC)   [place] [facility] [place], [place] T, NP   1 year ago Type 2 diabetes mellitus with diabetic neuropathy, without long-term current use of insulin (HCC)   [place] [facility] [place], [place] T, NP   1 year  ago Type 2 diabetes mellitus with diabetic neuropathy, without long-term current use of insulin (HCC)   [place] [facility] [place], Dorie Rank, NP       Future Appointments             Today [name], NP [place] [facility], [facility]   In 1 month Vanna Scotland, MD [facility] [place]

## 2022-12-04 NOTE — Assessment & Plan Note (Signed)
Chronic, stable.  BP well at goal in office today.  Urine ALB 20 June 2022.  Continue current medication regimen at this time, Benazepril offering kidney protection + Hydrochlorothiazide 12.5 MG daily which has helped lower BP.  Can not take Amlodipine, although this would offer benefit.  Recommend checking BP a few mornings a week at home and documenting + focus on DASH diet.  Monitor for worsening edema and may provide PRN Lasix if presents, suspect some underlying PVD based on exam.  Recommend avoid Ibuprofen products and high salt intake.  Return in 3 months.  Labs today: CBC, CMP.

## 2022-12-05 LAB — COMPREHENSIVE METABOLIC PANEL
ALT: 10 [IU]/L (ref 0–44)
AST: 13 [IU]/L (ref 0–40)
Albumin: 4.4 g/dL (ref 3.8–4.8)
Alkaline Phosphatase: 62 [IU]/L (ref 44–121)
BUN/Creatinine Ratio: 24 (ref 10–24)
BUN: 19 mg/dL (ref 8–27)
Bilirubin Total: 0.4 mg/dL (ref 0.0–1.2)
CO2: 26 mmol/L (ref 20–29)
Calcium: 9.7 mg/dL (ref 8.6–10.2)
Chloride: 102 mmol/L (ref 96–106)
Creatinine, Ser: 0.8 mg/dL (ref 0.76–1.27)
Globulin, Total: 2.3 g/dL (ref 1.5–4.5)
Glucose: 164 mg/dL — ABNORMAL HIGH (ref 70–99)
Potassium: 4 mmol/L (ref 3.5–5.2)
Sodium: 143 mmol/L (ref 134–144)
Total Protein: 6.7 g/dL (ref 6.0–8.5)
eGFR: 91 mL/min/{1.73_m2} (ref >59)

## 2022-12-05 LAB — LIPID PANEL W/O CHOL/HDL RATIO
Cholesterol, Total: 148 mg/dL (ref 100–199)
HDL: 48 mg/dL (ref >39)
LDL Chol Calc (NIH): 73 mg/dL (ref 0–99)
Triglycerides: 157 mg/dL — ABNORMAL HIGH (ref 0–149)
VLDL Cholesterol Cal: 27 mg/dL (ref 5–40)

## 2022-12-05 LAB — CBC WITH DIFFERENTIAL/PLATELET
Basophils Absolute: 0 10*3/uL (ref 0.0–0.2)
Basos: 1 %
EOS (ABSOLUTE): 0.2 10*3/uL (ref 0.0–0.4)
Eos: 4 %
Hematocrit: 42.3 % (ref 37.5–51.0)
Hemoglobin: 13.9 g/dL (ref 13.0–17.7)
Immature Grans (Abs): 0 10*3/uL (ref 0.0–0.1)
Immature Granulocytes: 1 %
Lymphocytes Absolute: 1.4 10*3/uL (ref 0.7–3.1)
Lymphs: 24 %
MCH: 29.3 pg (ref 26.6–33.0)
MCHC: 32.9 g/dL (ref 31.5–35.7)
MCV: 89 fL (ref 79–97)
Monocytes Absolute: 0.5 10*3/uL (ref 0.1–0.9)
Monocytes: 8 %
Neutrophils Absolute: 3.6 10*3/uL (ref 1.4–7.0)
Neutrophils: 62 %
Platelets: 254 10*3/uL (ref 150–450)
RBC: 4.75 x10E6/uL (ref 4.14–5.80)
RDW: 12.9 % (ref 11.6–15.4)
WBC: 5.8 10*3/uL (ref 3.4–10.8)

## 2022-12-05 NOTE — Progress Notes (Signed)
Called patient he stated that he go a flu shot at appt and will get the covid shot from pharmacy

## 2022-12-05 NOTE — Progress Notes (Signed)
Contacted via MyChart   Good afternoon Tyler Morgan, your labs have returned and remain stable.  Great news.  Stick to changes as we discussed.  Any questions? Keep being amazing!!  Thank you for allowing me to participate in your care.  I appreciate you. Kindest regards, Rakim Moone

## 2022-12-06 ENCOUNTER — Ambulatory Visit: Payer: Medicare Other

## 2022-12-06 ENCOUNTER — Encounter: Payer: Self-pay | Admitting: Nurse Practitioner

## 2022-12-06 DIAGNOSIS — R262 Difficulty in walking, not elsewhere classified: Secondary | ICD-10-CM

## 2022-12-06 DIAGNOSIS — M6281 Muscle weakness (generalized): Secondary | ICD-10-CM | POA: Diagnosis not present

## 2022-12-06 DIAGNOSIS — Z9181 History of falling: Secondary | ICD-10-CM

## 2022-12-06 DIAGNOSIS — R2681 Unsteadiness on feet: Secondary | ICD-10-CM

## 2022-12-06 NOTE — Therapy (Signed)
OUTPATIENT PHYSICAL THERAPY TREATMENT NOTE    Patient Name: Tyler Morgan MRN: [id number] DOB:1945-04-04, 77 y.o., male Today's Date: 12/06/2022  PCP: [name], NP  REFERRING PROVIDER: [name], NP   END OF SESSION:  PT End of Session - 12/06/22 0821     Visit Number 28    Number of Visits 49    Date for PT Re-Evaluation 01/19/23    PT Start Time 0821    PT Stop Time 0900    PT Time Calculation (min) 39 min    Activity Tolerance Patient tolerated treatment well    Behavior During Therapy [facility] for tasks assessed/performed                                      Past Medical History:  Diagnosis Date   Diabetes mellitus without complication (HCC)    ED (erectile dysfunction)    Hypertension    Motion sickness    boats   Neuropathy    Prostate enlargement    Urinary retention    Past Surgical History:  Procedure Laterality Date   BASAL CELL CARCINOMA EXCISION     nose    CATARACT EXTRACTION Bilateral 1986, 2014   COLONOSCOPY WITH PROPOFOL N/A 03/27/2018   Procedure: COLONOSCOPY WITH BIOPSIES;  Surgeon: Toney Reil, MD;  Location: [facility];  Service: Endoscopy;  Laterality: N/A;  Diabetic - oral meds   EYE SURGERY Bilateral [phone]   FRACTURE SURGERY Right    as a child/ right arm   HERNIA REPAIR Right 01-13-14   inguinal hernia   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 N/A 02/07/2017   Procedure: 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;  Surgeon: Vanna Scotland, MD;  Location: [facility] OR[facility];  Service: Urology;  Laterality: N/A;   POLYPECTOMY N/A 03/27/2018   Procedure: POLYPECTOMY;  Surgeon: Toney Reil, MD;  Location: [facility];  Service: Endoscopy;  Laterality: N/A;   RETINAL DETACHMENT SURGERY  Jan 2015   TONSILLECTOMY  1955   Patient Active Problem List   Diagnosis Date Noted   PVD (peripheral vascular disease) (HCC) 11/30/2021   B12  deficiency 01/29/2019   History of prostate cancer 12/13/2017   Hemorrhoids 12/13/2017   Hyperlipidemia associated with type 2 diabetes mellitus (HCC) 04/02/2017   Advanced care planning/counseling discussion 10/11/2016   Type 2 diabetes mellitus with diabetic neuropathy (HCC) 12/15/2014   Hypertension associated with diabetes (HCC)     REFERRING DIAG: E11.40 (ICD-10-CM) - Type 2 diabetes mellitus with diabetic neuropathy, without long-term current use of insulin (HCC)   THERAPY DIAG:  Unsteadiness on feet  Difficulty in walking, not elsewhere classified  Muscle weakness (generalized)  History of falling  Rationale for Evaluation and Treatment Rehabilitation  PERTINENT HISTORY: Balance, weakness. Can get around well without a cane. Standing still, pt loses balance. Has to hold onto things to stabilize himself. Has labradore thats about 77 year old and can walk his dog without a leash because the dog's pulling stabilizes him. Symptoms have been going on for a while and was told to have diabetic neuropathy. Had a fall in 2015 while working on the flooring in his den at home which caused R inguinal hernia S/P mesh placement. Pt states noticing problems in his legs since 2015. Pt also could not push up on his toes (tip toe) since then. No back pain currently but has had  a hx of back pain over the years. Decreased sensation B feet, almost not noticeable. Feels like he has a lot of weakness which makes it difficult to keep his balance. Has B LE swelling, comes and goes, not currently getting treated for it. No low back pain, just L arm and shoulder.   PRECAUTIONS: Fall risk; L5 anterolisthesis   SUBJECTIVE:   SUBJECTIVE STATEMENT:  Started first dose of Jardiance this morning. Makes him dizzy      PAIN:  Are you having pain? See subjective   TODAY'S TREATMENT:                                                                                                                                          DATE: 12/06/2022      Per MRI on 12/26/2016: 1. 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 There is probable chronic bilateral L5 nerve root encroachment.      No latex allergies.       Therapeutic exercise  SLS with contralateral UE assistn (as little assist as possible)  R 30 seconds x 3  L 30 seconds x 3   Sitting with upright posture                Gentle manually resisted trunk extension isometrics in neutral to help decrease lumbar anterolisthesis                10x3 with 5 second holds   Standing forward weight shift onto forefeet 10x10 seconds with UE light touch assist  To promote increase use and strengthening of B gastroc/soleus muscles.   Forward step up onto Air Ex pad with contralateral UE light touch assist   R 10x3  L 10x3   Standing alternating toe taps onto cone with contralateral UE asist 10x4 each LE  Four square step exercise CGA to min A   Clockwise 4x2  Counterclockwise 4x2  Difficulty with with balance secondary to B ankle weakness. LOB multiple times with min A to recover.   Seated B ankle DF/PF 10x2  Seated B ankle PF 5x5 seconds for 4 sets        Improved exercise technique, movement at target joints, use of target muscles after mod verbal, visual, tactile cues.         Response to treatment Pt tolerated session well without aggravation of symptoms.       Clinical impression  Worked on standing balance activities to challenge closed chain muscle use of gastroc/soleus muscles as well as improving closed chain glute strength for obstacle negotiation. Difficulty with multidirectional stepping (four square step exercise) secondary to B ankle weakness. Pt tolerated session well without aggravation of symptoms.  Pt will benefit from skilled physical therapy services to improve strength, balance, and function.         PATIENT  EDUCATION: Education details: there-ex, HEP Person educated: Patient Education method: Explanation, Demonstration, Tactile cues, Verbal cues, and Handouts Education comprehension: verbalized understanding and returned demonstration  HOME EXERCISE PROGRAM: Access Code: [email or web address] URL: [email or web address] Date: 07/24/2022 Prepared by: Loralyn Freshwater   Exercises - Seated Transversus Abdominis Bracing  - 5 x daily - 7 x weekly - 3 sets - 10 reps - 5 seconds hold  - Supine Posterior Pelvic Tilt  - 1 x daily - 7 x weekly - 3 sets - 10 reps - 5 seconds hold - Long Sitting Ankle Plantar Flexion with Resistance  - 1 x daily - 7 x weekly - 3 sets - 10 reps  Upgraded to green band on 10/17/2022     - Long Sitting Ankle Dorsiflexion AROM  - 1 x daily - 7 x weekly - 3 sets - 10 reps - 5 seconds hold  - Supine March with Posterior Pelvic Tilt  - 1 x daily - 7 x weekly - 3 sets - 10 reps - Curl Up with Arms Crossed  - 1 x daily - 7 x weekly - 3 sets - 10 reps - Supine Double Knee to Chest  - 1 x daily - 7 x weekly - 3 sets - 10 reps - 5 seconds hold - Bent Knee Fallouts with Alternating Legs  - 1 x daily - 7 x weekly - 3 sets - 10 reps - Standing Posterior Pelvic Tilt  - 3 x daily - 7 x weekly - 3 sets - 10 reps - 5 seconds hold  - Mini Squat with Counter Support  - 1 x daily - 7 x weekly - 3 sets - 10 reps  - Standing Hip Abduction with Resistance at Ankles and Counter Support  - 1 x daily - 7 x weekly - 1-3 sets - 10 reps - 5 seconds hold   Recommended for pt to perform scapular retractions and chin tucks at home (sitting) to help continue to decrease neck stiffness. Pt demonstrated and verbalized understanding.    - Seated Toe Raise  - 1 x daily - 7 x weekly - 1-3 sets - 10 reps - 5 seconds hold Then with folded towel under heel to promote more strength in more B ankle PF position (added on 10/30/2021)  - Long Sitting Ankle Eversion with Resistance  - 1 x daily - 7 x weekly -  2-3 sets - 10 reps  Yellow band     Seated B heel raises  5x5 seconds for 2 sets   Then with folded towel under heel to promote more strength in more B ankle PF position    5x5 seconds for 2 sets   PT Short Term Goals - 08/28/22 1617       PT SHORT TERM GOAL #1   Title Pt will be independent with his initial HEP to improve strength, balance, and function.    Baseline Pt has started his HEP. (07/24/2022); pt demonstrates independence with his inital HEP (08/28/2022)    Time 3    Period Weeks    Status Achieved    Target Date 08/18/22              PT Long Term Goals - 11/22/22 0841       PT LONG TERM GOAL #1   Title Pt will improve B ankle PF, DF, EV, and IV strength by at least 1/2 MMT grade to promote ability to ambulate, as well as perform standing tasks with less difficulty and  decreased fall risk.    Baseline Manually resisted ankle: PF 3+/5 R, and L;  DF 3+/5 R, 3/5 L; EV 4-/5 R, 3+/5 L; IV 3+/5 R and L (07/24/2022); Manually resisted ankle PF 4-/5 R, 3+/5 L,  DF 3+/5 R, 3+/5 L, EV 4-/5 R, 3+/5 L, IV 3+/5 R, 3+/5 L (08/28/2022); Manually resisted ankle: PF 4/5 R, 4-/5 L, DF 3+/5 R, 3+/5 L, EV 4/5 R, 4-/5L, IV 4-/5 R, 4-/5 L (09/20/2022); Manually resisted ankle PF 4/5 R, 4-/5 L, DF 3+/5 R, 3+/5 L, EV 4/5 R, 4/5 L, IV 4/5 R, 4-/5 L (11/22/2022)    Time 8    Period Weeks    Status Partially Met    Target Date 01/19/23      PT LONG TERM GOAL #2   Title Pt will improve B hip extension and abduction strength to promote ability to perform standing tasks with less difficulty and decreased fall risk.    Baseline Hip extension 3+/5 R and L; hip abduction 4/5 R and L (07/24/2022); hip extension 4/5 R and L, hip abduction 4+/5 R and L (08/28/2022); Hip extension 4/5 R, 4/5 L, hip abduction 4+/5 R, 5/5 L (09/20/2022)    Time 8    Period Weeks    Status Achieved    Target Date 09/22/22      PT LONG TERM GOAL #3   Title Pt will improve his FOTO score by at least 10 points as a demonsration  of improved function.    Baseline Foot FOTO 67 (07/24/2022); 67 (09/13/2022)    Time 8    Period Weeks    Status On-going    Target Date 01/19/23      PT LONG TERM GOAL #4   Title Pt will improve his DGI score to at least 19 points as a demonstration of improved balance and decreased fall risk.    Baseline DGI 13 (07/24/2022); 16 (08/28/2022); 15 (11/22/2022)    Time 8    Period Weeks    Status On-going    Target Date 01/19/23              Plan - 12/06/22 6578     Personal Factors and Comorbidities Comorbidity 3+;Age;Past/Current Experience;Time since onset of injury/illness/exacerbation;Profession;Fitness    Comorbidities HTN, DM, neuropathy    Examination-Activity Limitations Stand;Lift;Carry;Bend;Locomotion Level;Dressing;Squat;Transfers;Stairs    Stability/Clinical Decision Making Stable/Uncomplicated   Balance seem to be worsening based on subjective reports.   Rehab Potential Fair    PT Frequency 2x / week    PT Duration 8 weeks    PT Treatment/Interventions Therapeutic exercise;Balance training;Functional mobility training;Stair training;Therapeutic activities;Neuromuscular re-education;Patient/family education;Manual techniques;Electrical Stimulation;Gait training    PT Next Visit Plan lumbopelvic strengthening, stability and control, B hip, thigh, and ankle strengthening, balance, gait, manual techniques, modalities PRN.    PT Home Exercise Plan Medbridge Access Code: [email or web address]    Consulted and Agree with Plan of Care Patient                    Loralyn Freshwater PT, DPT  12/06/2022, 10:59 AM

## 2022-12-08 ENCOUNTER — Ambulatory Visit: Payer: Medicare Other

## 2022-12-08 DIAGNOSIS — R2681 Unsteadiness on feet: Secondary | ICD-10-CM | POA: Diagnosis not present

## 2022-12-08 DIAGNOSIS — M6281 Muscle weakness (generalized): Secondary | ICD-10-CM | POA: Diagnosis not present

## 2022-12-08 DIAGNOSIS — Z9181 History of falling: Secondary | ICD-10-CM | POA: Diagnosis not present

## 2022-12-08 DIAGNOSIS — R262 Difficulty in walking, not elsewhere classified: Secondary | ICD-10-CM

## 2022-12-08 NOTE — Therapy (Signed)
OUTPATIENT PHYSICAL THERAPY TREATMENT NOTE    Patient Name: Tyler Morgan MRN: [id number] DOB:Apr 30, 1945, 77 y.o., male Today's Date: 12/08/2022  PCP: [name], NP  REFERRING PROVIDER: [name], NP   END OF SESSION:  PT End of Session - 12/08/22 0820     Visit Number 29    Number of Visits 49    Date for PT Re-Evaluation 01/19/23    PT Start Time 0820    PT Stop Time 0900    PT Time Calculation (min) 40 min    Activity Tolerance Patient tolerated treatment well    Behavior During Therapy WFL for tasks assessed/performed                                       Past Medical History:  Diagnosis Date   Diabetes mellitus without complication (HCC)    ED (erectile dysfunction)    Hypertension    Motion sickness    boats   Neuropathy    Prostate enlargement    Urinary retention    Past Surgical History:  Procedure Laterality Date   BASAL CELL CARCINOMA EXCISION     nose    CATARACT EXTRACTION Bilateral 1986, 2014   COLONOSCOPY WITH PROPOFOL N/A 03/27/2018   Procedure: COLONOSCOPY WITH BIOPSIES;  Surgeon: Toney Reil, MD;  Location: [facility];  Service: Endoscopy;  Laterality: N/A;  Diabetic - oral meds   EYE SURGERY Bilateral [phone]   FRACTURE SURGERY Right    as a child/ right arm   HERNIA REPAIR Right 01-13-14   inguinal hernia   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 N/A 02/07/2017   Procedure: 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;  Surgeon: Vanna Scotland, MD;  Location: [facility];  Service: Urology;  Laterality: N/A;   POLYPECTOMY N/A 03/27/2018   Procedure: POLYPECTOMY;  Surgeon: Toney Reil, MD;  Location: [facility];  Service: Endoscopy;  Laterality: N/A;   RETINAL DETACHMENT SURGERY  Jan 2015   TONSILLECTOMY  1955   Patient Active Problem List   Diagnosis Date Noted   PVD (peripheral vascular disease) (HCC) 11/30/2021   B12  deficiency 01/29/2019   History of prostate cancer 12/13/2017   Hemorrhoids 12/13/2017   Hyperlipidemia associated with type 2 diabetes mellitus (HCC) 04/02/2017   Advanced care planning/counseling discussion 10/11/2016   Type 2 diabetes mellitus with diabetic neuropathy (HCC) 12/15/2014   Hypertension associated with diabetes (HCC)     REFERRING DIAG: E11.40 (ICD-10-CM) - Type 2 diabetes mellitus with diabetic neuropathy, without long-term current use of insulin (HCC)   THERAPY DIAG:  Unsteadiness on feet  Difficulty in walking, not elsewhere classified  Muscle weakness (generalized)  History of falling  Rationale for Evaluation and Treatment Rehabilitation  PERTINENT HISTORY: Balance, weakness. Can get around well without a cane. Standing still, pt loses balance. Has to hold onto things to stabilize himself. Has labradore thats about 77 year old and can walk his dog without a leash because the dog's pulling stabilizes him. Symptoms have been going on for a while and was told to have diabetic neuropathy. Had a fall in 2015 while working on the flooring in his den at home which caused R inguinal hernia S/P mesh placement. Pt states noticing problems in his legs since 2015. Pt also could not push up on his toes (tip toe) since then. No back pain currently but has  had a hx of back pain over the years. Decreased sensation B feet, almost not noticeable. Feels like he has a lot of weakness which makes it difficult to keep his balance. Has B LE swelling, comes and goes, not currently getting treated for it. No low back pain, just L arm and shoulder.   PRECAUTIONS: Fall risk; L5 anterolisthesis   SUBJECTIVE:   SUBJECTIVE STATEMENT:  Adapting to the Jardiance medication. Gets dizzy with the medication in the mornings. No pain or discomfort anywhere currently. Dizziness clears up mid morning around 10 am.       PAIN:  Are you having pain? See subjective   TODAY'S TREATMENT:                                                                                                                                          DATE: 12/08/2022      Per MRI on 12/26/2016: 1. 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 There is probable chronic bilateral L5 nerve root encroachment.      No latex allergies.       Therapeutic exercise   Sitting with upright posture                Gentle manually resisted trunk extension isometrics in neutral to help decrease lumbar anterolisthesis                10x3 with 5 second holds  Seated manually resisted ankle PF (concentric, isometric (about 5 second holds) and eccentric contraction)                 L 10x3                         Soleus contraction palpated                 R 10x3                         Gastroc and solues muscle palpated                   Seated PT assisted ankle EV to end range with eccentric manual resistance                L 10x3                R 10x3   Sitting with 2 folded towel rolls under B heels  B heel raises 10x5 seconds for 2 sets        Improved exercise technique, movement at target joints, use of target muscles after mod verbal, visual, tactile cues.         Response to treatment Pt tolerated session well without aggravation of symptoms.       Clinical  impression  Worked on ankle strengthening predominantly today to promote ankle strength with standing balance activities. Still demonstrates L ankle weakness > R but pt able to provide some resistance. Pt tolerated session well without aggravation of symptoms.  Pt will benefit from skilled physical therapy services to improve strength, balance, and function.         PATIENT EDUCATION: Education details: there-ex, HEP Person educated: Patient Education method: Explanation, Demonstration, Tactile cues, Verbal cues, and Handouts Education comprehension:  verbalized understanding and returned demonstration  HOME EXERCISE PROGRAM: Access Code: [email or web address] URL: [email or web address] Date: 07/24/2022 Prepared by: Loralyn Freshwater   Exercises - Seated Transversus Abdominis Bracing  - 5 x daily - 7 x weekly - 3 sets - 10 reps - 5 seconds hold  - Supine Posterior Pelvic Tilt  - 1 x daily - 7 x weekly - 3 sets - 10 reps - 5 seconds hold - Long Sitting Ankle Plantar Flexion with Resistance  - 1 x daily - 7 x weekly - 3 sets - 10 reps  Upgraded to green band on 10/17/2022     - Long Sitting Ankle Dorsiflexion AROM  - 1 x daily - 7 x weekly - 3 sets - 10 reps - 5 seconds hold  - Supine March with Posterior Pelvic Tilt  - 1 x daily - 7 x weekly - 3 sets - 10 reps - Curl Up with Arms Crossed  - 1 x daily - 7 x weekly - 3 sets - 10 reps - Supine Double Knee to Chest  - 1 x daily - 7 x weekly - 3 sets - 10 reps - 5 seconds hold - Bent Knee Fallouts with Alternating Legs  - 1 x daily - 7 x weekly - 3 sets - 10 reps - Standing Posterior Pelvic Tilt  - 3 x daily - 7 x weekly - 3 sets - 10 reps - 5 seconds hold  - Mini Squat with Counter Support  - 1 x daily - 7 x weekly - 3 sets - 10 reps  - Standing Hip Abduction with Resistance at Ankles and Counter Support  - 1 x daily - 7 x weekly - 1-3 sets - 10 reps - 5 seconds hold   Recommended for pt to perform scapular retractions and chin tucks at home (sitting) to help continue to decrease neck stiffness. Pt demonstrated and verbalized understanding.    - Seated Toe Raise  - 1 x daily - 7 x weekly - 1-3 sets - 10 reps - 5 seconds hold Then with folded towel under heel to promote more strength in more B ankle PF position (added on 10/30/2021)  - Long Sitting Ankle Eversion with Resistance  - 1 x daily - 7 x weekly - 2-3 sets - 10 reps  Yellow band     Seated B heel raises  5x5 seconds for 2 sets   Then with folded towel under heel to promote more strength in more B ankle PF  position    5x5 seconds for 2 sets   PT Short Term Goals - 08/28/22 1617       PT SHORT TERM GOAL #1   Title Pt will be independent with his initial HEP to improve strength, balance, and function.    Baseline Pt has started his HEP. (07/24/2022); pt demonstrates independence with his inital HEP (08/28/2022)    Time 3    Period Weeks    Status Achieved    Target Date 08/18/22  PT Long Term Goals - 11/22/22 0841       PT LONG TERM GOAL #1   Title Pt will improve B ankle PF, DF, EV, and IV strength by at least 1/2 MMT grade to promote ability to ambulate, as well as perform standing tasks with less difficulty and decreased fall risk.    Baseline Manually resisted ankle: PF 3+/5 R, and L;  DF 3+/5 R, 3/5 L; EV 4-/5 R, 3+/5 L; IV 3+/5 R and L (07/24/2022); Manually resisted ankle PF 4-/5 R, 3+/5 L,  DF 3+/5 R, 3+/5 L, EV 4-/5 R, 3+/5 L, IV 3+/5 R, 3+/5 L (08/28/2022); Manually resisted ankle: PF 4/5 R, 4-/5 L, DF 3+/5 R, 3+/5 L, EV 4/5 R, 4-/5L, IV 4-/5 R, 4-/5 L (09/20/2022); Manually resisted ankle PF 4/5 R, 4-/5 L, DF 3+/5 R, 3+/5 L, EV 4/5 R, 4/5 L, IV 4/5 R, 4-/5 L (11/22/2022)    Time 8    Period Weeks    Status Partially Met    Target Date 01/19/23      PT LONG TERM GOAL #2   Title Pt will improve B hip extension and abduction strength to promote ability to perform standing tasks with less difficulty and decreased fall risk.    Baseline Hip extension 3+/5 R and L; hip abduction 4/5 R and L (07/24/2022); hip extension 4/5 R and L, hip abduction 4+/5 R and L (08/28/2022); Hip extension 4/5 R, 4/5 L, hip abduction 4+/5 R, 5/5 L (09/20/2022)    Time 8    Period Weeks    Status Achieved    Target Date 09/22/22      PT LONG TERM GOAL #3   Title Pt will improve his FOTO score by at least 10 points as a demonsration of improved function.    Baseline Foot FOTO 67 (07/24/2022); 67 (09/13/2022)    Time 8    Period Weeks    Status On-going    Target Date 01/19/23      PT LONG TERM  GOAL #4   Title Pt will improve his DGI score to at least 19 points as a demonstration of improved balance and decreased fall risk.    Baseline DGI 13 (07/24/2022); 16 (08/28/2022); 15 (11/22/2022)    Time 8    Period Weeks    Status On-going    Target Date 01/19/23              Plan - 12/08/22 0907     Clinical Impression Statement Worked on ankle strengthening predominantly today to promote ankle strength with standing balance activities. Still demonstrates L ankle weakness > R but pt able to provide some resistance. Pt tolerated session well without aggravation of symptoms.  Pt will benefit from skilled physical therapy services to improve strength, balance, and function.    Personal Factors and Comorbidities Comorbidity 3+;Age;Past/Current Experience;Time since onset of injury/illness/exacerbation;Profession;Fitness    Comorbidities HTN, DM, neuropathy    Examination-Activity Limitations Stand;Lift;Carry;Bend;Locomotion Level;Dressing;Squat;Transfers;Stairs    Stability/Clinical Decision Making Stable/Uncomplicated   Balance seem to be worsening based on subjective reports.   Rehab Potential Fair    PT Frequency 2x / week    PT Duration 8 weeks    PT Treatment/Interventions Therapeutic exercise;Balance training;Functional mobility training;Stair training;Therapeutic activities;Neuromuscular re-education;Patient/family education;Manual techniques;Electrical Stimulation;Gait training    PT Next Visit Plan lumbopelvic strengthening, stability and control, B hip, thigh, and ankle strengthening, balance, gait, manual techniques, modalities PRN.    PT Home Exercise Plan Medbridge Access Code: [email or web address]  Consulted and Agree with Plan of Care Patient                     Loralyn Freshwater PT, DPT  12/08/2022, 9:09 AM

## 2022-12-11 ENCOUNTER — Ambulatory Visit: Payer: Medicare Other

## 2022-12-11 DIAGNOSIS — M6281 Muscle weakness (generalized): Secondary | ICD-10-CM | POA: Diagnosis not present

## 2022-12-11 DIAGNOSIS — R2681 Unsteadiness on feet: Secondary | ICD-10-CM | POA: Diagnosis not present

## 2022-12-11 DIAGNOSIS — R262 Difficulty in walking, not elsewhere classified: Secondary | ICD-10-CM

## 2022-12-11 DIAGNOSIS — Z9181 History of falling: Secondary | ICD-10-CM | POA: Diagnosis not present

## 2022-12-11 NOTE — Therapy (Signed)
OUTPATIENT PHYSICAL THERAPY TREATMENT NOTE And Progress Report (10/04/2022 - 12/11/2022)   Patient Name: Tyler Morgan MRN: [id number] DOB:10-30-1945, 77 y.o., male Today's Date: 12/11/2022  PCP: [name], NP  REFERRING PROVIDER: [name], NP   END OF SESSION:  PT End of Session - 12/11/22 0820     Visit Number 30    Number of Visits 49    Date for PT Re-Evaluation 01/19/23    PT Start Time 0820    PT Stop Time 0901    PT Time Calculation (min) 41 min    Activity Tolerance Patient tolerated treatment well    Behavior During Therapy [facility] for tasks assessed/performed                                        Past Medical History:  Diagnosis Date   Diabetes mellitus without complication (HCC)    ED (erectile dysfunction)    Hypertension    Motion sickness    boats   Neuropathy    Prostate enlargement    Urinary retention    Past Surgical History:  Procedure Laterality Date   BASAL CELL CARCINOMA EXCISION     nose    CATARACT EXTRACTION Bilateral 1986, 2014   COLONOSCOPY WITH PROPOFOL N/A 03/27/2018   Procedure: COLONOSCOPY WITH BIOPSIES;  Surgeon: Toney Reil, MD;  Location: [facility];  Service: Endoscopy;  Laterality: N/A;  Diabetic - oral meds   EYE SURGERY Bilateral [phone]   FRACTURE SURGERY Right    as a child/ right arm   HERNIA REPAIR Right 01-13-14   inguinal hernia   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 N/A 02/07/2017   Procedure: 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;  Surgeon: Vanna Scotland, MD;  Location: [facility];  Service: Urology;  Laterality: N/A;   POLYPECTOMY N/A 03/27/2018   Procedure: POLYPECTOMY;  Surgeon: Toney Reil, MD;  Location: [facility];  Service: Endoscopy;  Laterality: N/A;   RETINAL DETACHMENT SURGERY  Jan 2015   TONSILLECTOMY  1955   Patient Active Problem List   Diagnosis Date Noted   PVD (peripheral  vascular disease) (HCC) 11/30/2021   B12 deficiency 01/29/2019   History of prostate cancer 12/13/2017   Hemorrhoids 12/13/2017   Hyperlipidemia associated with type 2 diabetes mellitus (HCC) 04/02/2017   Advanced care planning/counseling discussion 10/11/2016   Type 2 diabetes mellitus with diabetic neuropathy (HCC) 12/15/2014   Hypertension associated with diabetes (HCC)     REFERRING DIAG: E11.40 (ICD-10-CM) - Type 2 diabetes mellitus with diabetic neuropathy, without long-term current use of insulin (HCC)   THERAPY DIAG:  Unsteadiness on feet  Difficulty in walking, not elsewhere classified  Muscle weakness (generalized)  History of falling  Rationale for Evaluation and Treatment Rehabilitation  PERTINENT HISTORY: Balance, weakness. Can get around well without a cane. Standing still, pt loses balance. Has to hold onto things to stabilize himself. Has labradore thats about 77 year old and can walk his dog without a leash because the dog's pulling stabilizes him. Symptoms have been going on for a while and was told to have diabetic neuropathy. Had a fall in 2015 while working on the flooring in his den at home which caused R inguinal hernia S/P mesh placement. Pt states noticing problems in his legs since 2015. Pt also could not push up on his toes (tip toe) since then.  No back pain currently but has had a hx of back pain over the years. Decreased sensation B feet, almost not noticeable. Feels like he has a lot of weakness which makes it difficult to keep his balance. Has B LE swelling, comes and goes, not currently getting treated for it. No low back pain, just L arm and shoulder.   PRECAUTIONS: Fall risk; L5 anterolisthesis   SUBJECTIVE:   SUBJECTIVE STATEMENT:  Friday afternoon, pt had a difficult time walking. Had a difficult time picking up his L foot (ankle DF). Might have been due to the seated heel raise exercise with the towel. Saturday did not have problems walking. Did a lot  of walking this past weekend.         PAIN:  Are you having pain? See subjective   TODAY'S TREATMENT:                                                                                                                                         DATE: 12/11/2022      Per MRI on 12/26/2016: 1. 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 There is probable chronic bilateral L5 nerve root encroachment.      No latex allergies.       Therapeutic exercise    Seated manually resisted ankle PF, DF, EV, IV  Directed patient with gait with normal gait speed, with changes in speed, 180 degree pivot turn, with R and L cervical rotation position, with cervical flexion and extension position, stepping around obstacles, stepping over an obstacle, ascending and descending 4 regular steps with UE assist  (DGI 20 points)  Reviewed progress/current status with PT towards goals   Recommended B AFO   Seated manually resisted ankle PF (concentric, isometric (about 5 second holds) and eccentric contraction)                 L 10x3                         Soleus contraction palpated                 R 10x2                         Gastroc and solues muscle palpated      Improved exercise technique, movement at target joints, use of target muscles after mod verbal, visual, tactile cues.         Response to treatment Pt tolerated session well without aggravation of symptoms.       Clinical impression  Pt demonstrates overall improved B ankle strength as well as improved dynamic balance (DGI score 20) since initial evaluation. Pt making progress with PT towards goals. Pt still demonstrates difficulty with static balance with tendency to  take steps backwards or bend B knees to improve steadiness. Pt may benefit from a prescription for an AFO for both ankles from his provider to help with ankle stability and  balance.   Pt tolerated session well without aggravation of symptoms.  Pt will benefit from skilled physical therapy services to improve strength, balance, and function.         PATIENT EDUCATION: Education details: there-ex, HEP Person educated: Patient Education method: Explanation, Demonstration, Tactile cues, Verbal cues, and Handouts Education comprehension: verbalized understanding and returned demonstration  HOME EXERCISE PROGRAM: Access Code: [email or web address] URL: [email or web address] Date: 07/24/2022 Prepared by: Loralyn Freshwater   Exercises - Seated Transversus Abdominis Bracing  - 5 x daily - 7 x weekly - 3 sets - 10 reps - 5 seconds hold  - Supine Posterior Pelvic Tilt  - 1 x daily - 7 x weekly - 3 sets - 10 reps - 5 seconds hold - Long Sitting Ankle Plantar Flexion with Resistance  - 1 x daily - 7 x weekly - 3 sets - 10 reps  Upgraded to green band on 10/17/2022     - Long Sitting Ankle Dorsiflexion AROM  - 1 x daily - 7 x weekly - 3 sets - 10 reps - 5 seconds hold  - Supine March with Posterior Pelvic Tilt  - 1 x daily - 7 x weekly - 3 sets - 10 reps - Curl Up with Arms Crossed  - 1 x daily - 7 x weekly - 3 sets - 10 reps - Supine Double Knee to Chest  - 1 x daily - 7 x weekly - 3 sets - 10 reps - 5 seconds hold - Bent Knee Fallouts with Alternating Legs  - 1 x daily - 7 x weekly - 3 sets - 10 reps - Standing Posterior Pelvic Tilt  - 3 x daily - 7 x weekly - 3 sets - 10 reps - 5 seconds hold  - Mini Squat with Counter Support  - 1 x daily - 7 x weekly - 3 sets - 10 reps  - Standing Hip Abduction with Resistance at Ankles and Counter Support  - 1 x daily - 7 x weekly - 1-3 sets - 10 reps - 5 seconds hold   Recommended for pt to perform scapular retractions and chin tucks at home (sitting) to help continue to decrease neck stiffness. Pt demonstrated and verbalized understanding.    - Seated Toe Raise  - 1 x daily - 7 x weekly - 1-3 sets - 10 reps - 5 seconds  hold Then with folded towel under heel to promote more strength in more B ankle PF position (added on 10/30/2021)  - Long Sitting Ankle Eversion with Resistance  - 1 x daily - 7 x weekly - 2-3 sets - 10 reps  Yellow band     Seated B heel raises  5x5 seconds for 2 sets   Then with folded towel under heel to promote more strength in more B ankle PF position    5x5 seconds for 2 sets   PT Short Term Goals - 08/28/22 1617       PT SHORT TERM GOAL #1   Title Pt will be independent with his initial HEP to improve strength, balance, and function.    Baseline Pt has started his HEP. (07/24/2022); pt demonstrates independence with his inital HEP (08/28/2022)    Time 3    Period Weeks    Status Achieved    Target Date  08/18/22              PT Long Term Goals - 12/11/22 0825       PT LONG TERM GOAL #1   Title Pt will improve B ankle PF, DF, EV, and IV strength by at least 1/2 MMT grade to promote ability to ambulate, as well as perform standing tasks with less difficulty and decreased fall risk.    Baseline Manually resisted ankle: PF 3+/5 R, and L;  DF 3+/5 R, 3/5 L; EV 4-/5 R, 3+/5 L; IV 3+/5 R and L (07/24/2022); Manually resisted ankle PF 4-/5 R, 3+/5 L,  DF 3+/5 R, 3+/5 L, EV 4-/5 R, 3+/5 L, IV 3+/5 R, 3+/5 L (08/28/2022); Manually resisted ankle: PF 4/5 R, 4-/5 L, DF 3+/5 R, 3+/5 L, EV 4/5 R, 4-/5L, IV 4-/5 R, 4-/5 L (09/20/2022); Manually resisted ankle PF 4/5 R, 4-/5 L, DF 3+/5 R, 3+/5 L, EV 4/5 R, 4/5 L, IV 4/5 R, 4-/5 L (11/22/2022); PF 4/5 R, 4-/5 L, DF 3+/5 R, 3+/5 L, EV 4/5 R, 4/5 L, IV 4/5 R, 4/5 L (12/11/2022)    Time 8    Period Weeks    Status Partially Met    Target Date 01/19/23      PT LONG TERM GOAL #2   Title Pt will improve B hip extension and abduction strength to promote ability to perform standing tasks with less difficulty and decreased fall risk.    Baseline Hip extension 3+/5 R and L; hip abduction 4/5 R and L (07/24/2022); hip extension 4/5 R and L, hip abduction  4+/5 R and L (08/28/2022); Hip extension 4/5 R, 4/5 L, hip abduction 4+/5 R, 5/5 L (09/20/2022)    Time 8    Period Weeks    Status Achieved    Target Date 09/22/22      PT LONG TERM GOAL #3   Title Pt will improve his FOTO score by at least 10 points as a demonsration of improved function.    Baseline Foot FOTO 67 (07/24/2022); 67 (09/13/2022)    Time 8    Period Weeks    Status On-going    Target Date 01/19/23      PT LONG TERM GOAL #4   Title Pt will improve his DGI score to at least 19 points as a demonstration of improved balance and decreased fall risk.    Baseline DGI 13 (07/24/2022); 16 (08/28/2022); 15 (11/22/2022); 20 (12/11/2022)    Time 8    Period Weeks    Status Achieved    Target Date 01/19/23              Plan - 12/11/22 1305     Clinical Impression Statement Pt demonstrates overall improved B ankle strength as well as improved dynamic balance (DGI score 20) since initial evaluation. Pt making progress with PT towards goals. Pt still demonstrates difficulty with static balance with tendency to take steps backwards or bend B knees to improve steadiness. Pt may benefit from a prescription for an AFO for both ankles from his provider to help with ankle stability and balance.   Pt tolerated session well without aggravation of symptoms.  Pt will benefit from skilled physical therapy services to improve strength, balance, and function.    Personal Factors and Comorbidities Comorbidity 3+;Age;Past/Current Experience;Time since onset of injury/illness/exacerbation;Profession;Fitness    Comorbidities HTN, DM, neuropathy    Examination-Activity Limitations Stand;Lift;Carry;Bend;Locomotion Level;Dressing;Squat;Transfers;Stairs    Stability/Clinical Decision Making Stable/Uncomplicated   Balance seem  to be worsening based on subjective reports.   Clinical Decision Making Low    Rehab Potential Fair    PT Frequency 2x / week    PT Duration 8 weeks    PT Treatment/Interventions  Therapeutic exercise;Balance training;Functional mobility training;Stair training;Therapeutic activities;Neuromuscular re-education;Patient/family education;Manual techniques;Electrical Stimulation;Gait training    PT Next Visit Plan lumbopelvic strengthening, stability and control, B hip, thigh, and ankle strengthening, balance, gait, manual techniques, modalities PRN.    PT Home Exercise Plan Medbridge Access Code: [email or web address]    Consulted and Agree with Plan of Care Patient                    Thank you for your referral.   Loralyn Freshwater PT, DPT  12/11/2022, 1:06 PM

## 2022-12-12 NOTE — Addendum Note (Signed)
Addended by: Aura Dials T on: 12/12/2022 03:06 PM   Modules accepted: Orders

## 2022-12-14 ENCOUNTER — Telehealth: Payer: Self-pay | Admitting: Nurse Practitioner

## 2022-12-14 NOTE — Telephone Encounter (Signed)
Copied from CRM [id number]. Topic: General - Call Back - No Documentation >> Dec 14, 2022 12:52 PM Ja-Kwan M wrote: Reason for CRM: Pt stated that he was returning a call to [place]. Pt requests call back. Cb# [phone]

## 2022-12-18 ENCOUNTER — Ambulatory Visit: Payer: Medicare Other

## 2022-12-18 DIAGNOSIS — R2681 Unsteadiness on feet: Secondary | ICD-10-CM | POA: Diagnosis not present

## 2022-12-18 DIAGNOSIS — Z9181 History of falling: Secondary | ICD-10-CM

## 2022-12-18 DIAGNOSIS — M6281 Muscle weakness (generalized): Secondary | ICD-10-CM

## 2022-12-18 DIAGNOSIS — R262 Difficulty in walking, not elsewhere classified: Secondary | ICD-10-CM | POA: Diagnosis not present

## 2022-12-18 NOTE — Therapy (Signed)
OUTPATIENT PHYSICAL THERAPY TREATMENT NOTE    Patient Name: Tyler Morgan MRN: [id number] DOB:[birth year], 77 y.o., male Today's Date: 12/18/2022  PCP: [name], NP  REFERRING PROVIDER: [name], NP   END OF SESSION:  PT End of Session - 12/18/22 0903     Visit Number 31    Number of Visits 49    Date for PT Re-Evaluation 01/19/23    PT Start Time 0904    PT Stop Time 0946    PT Time Calculation (min) 42 min    Activity Tolerance Patient tolerated treatment well    Behavior During Therapy [facility] for tasks assessed/performed                                         Past Medical History:  Diagnosis Date   Diabetes mellitus without complication (HCC)    ED (erectile dysfunction)    Hypertension    Motion sickness    boats   Neuropathy    Prostate enlargement    Urinary retention    Past Surgical History:  Procedure Laterality Date   BASAL CELL CARCINOMA EXCISION     nose    CATARACT EXTRACTION Bilateral 1986, 2014   COLONOSCOPY WITH PROPOFOL N/A 03/27/2018   Procedure: COLONOSCOPY WITH BIOPSIES;  Surgeon: Toney Reil, MD;  Location: [facility];  Service: Endoscopy;  Laterality: N/A;  Diabetic - oral meds   EYE SURGERY Bilateral [phone]   FRACTURE SURGERY Right    as a child/ right arm   HERNIA REPAIR Right 01-13-14   inguinal hernia   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 N/A 02/07/2017   Procedure: 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;  Surgeon: Vanna Scotland, MD;  Location: [facility];  Service: Urology;  Laterality: N/A;   POLYPECTOMY N/A 03/27/2018   Procedure: POLYPECTOMY;  Surgeon: Toney Reil, MD;  Location: [facility];  Service: Endoscopy;  Laterality: N/A;   RETINAL DETACHMENT SURGERY  Jan 2015   TONSILLECTOMY  1955   Patient Active Problem List   Diagnosis Date Noted   PVD (peripheral vascular disease) (HCC) 11/30/2021   B12  deficiency 01/29/2019   History of prostate cancer 12/13/2017   Hemorrhoids 12/13/2017   Hyperlipidemia associated with type 2 diabetes mellitus (HCC) 04/02/2017   Advanced care planning/counseling discussion 10/11/2016   Type 2 diabetes mellitus with diabetic neuropathy (HCC) 12/15/2014   Hypertension associated with diabetes (HCC)     REFERRING DIAG: E11.40 (ICD-10-CM) - Type 2 diabetes mellitus with diabetic neuropathy, without long-term current use of insulin (HCC)   THERAPY DIAG:  Unsteadiness on feet  Difficulty in walking, not elsewhere classified  Muscle weakness (generalized)  History of falling  Rationale for Evaluation and Treatment Rehabilitation  PERTINENT HISTORY: Balance, weakness. Can get around well without a cane. Standing still, pt loses balance. Has to hold onto things to stabilize himself. Has labradore thats about 77 year old and can walk his dog without a leash because the dog's pulling stabilizes him. Symptoms have been going on for a while and was told to have diabetic neuropathy. Had a fall in 2015 while working on the flooring in his den at home which caused R inguinal hernia S/P mesh placement. Pt states noticing problems in his legs since 2015. Pt also could not push up on his toes (tip toe) since then. No back pain currently  but has had a hx of back pain over the years. Decreased sensation B feet, almost not noticeable. Feels like he has a lot of weakness which makes it difficult to keep his balance. Has B LE swelling, comes and goes, not currently getting treated for it. No low back pain, just L arm and shoulder.   PRECAUTIONS: Fall risk; L5 anterolisthesis   SUBJECTIVE:   SUBJECTIVE STATEMENT:  Walking is not too bad these days.         PAIN:  Are you having pain? See subjective   TODAY'S TREATMENT:                                                                                                                                         DATE:  12/18/2022      Per MRI on 12/26/2016: 1. 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 There is probable chronic bilateral L5 nerve root encroachment.      No latex allergies.       Therapeutic exercise    Sitting with upright posture                Gentle manually resisted trunk extension isometrics in neutral to help decrease lumbar anterolisthesis                10x2 with 10 second holds  Seated manually resisted L Lateral shift isometrics in neutral to promote more neutral lumbar posture 10x3 with 5 second holds   Seated manually resisted ankle PF (concentric, isometric (about 5 second holds) and eccentric contraction)                 L 10x3                                         R 10x3                          Seated PT assisted ankle EV to end range with eccentric manual resistance                L 10x3                R 10x3  Static standing with PT manual perturbation without UE assist 10 seconds x    Improved exercise technique, movement at target joints, use of target muscles after mod verbal, visual, tactile cues.         Response to treatment Pt tolerated session well without aggravation of symptoms.       Clinical impression  Continued working on ankle strengthening to promote ability to perfrom standing tasks with less difficulty. B gastroc soleus weakness seem to play a factor with static standing  balance difficulty. Pt tolerated session well without aggravation of symptoms.  Pt will benefit from skilled physical therapy services to improve strength, balance, and function.         PATIENT EDUCATION: Education details: there-ex, HEP Person educated: Patient Education method: Explanation, Demonstration, Tactile cues, Verbal cues, and Handouts Education comprehension: verbalized understanding and returned demonstration  HOME EXERCISE PROGRAM: Access Code:  [email or web address] URL: [email or web address] Date: 07/24/2022 Prepared by: Loralyn Freshwater   Exercises - Seated Transversus Abdominis Bracing  - 5 x daily - 7 x weekly - 3 sets - 10 reps - 5 seconds hold  - Supine Posterior Pelvic Tilt  - 1 x daily - 7 x weekly - 3 sets - 10 reps - 5 seconds hold - Long Sitting Ankle Plantar Flexion with Resistance  - 1 x daily - 7 x weekly - 3 sets - 10 reps  Upgraded to green band on 10/17/2022     - Long Sitting Ankle Dorsiflexion AROM  - 1 x daily - 7 x weekly - 3 sets - 10 reps - 5 seconds hold  - Supine March with Posterior Pelvic Tilt  - 1 x daily - 7 x weekly - 3 sets - 10 reps - Curl Up with Arms Crossed  - 1 x daily - 7 x weekly - 3 sets - 10 reps - Supine Double Knee to Chest  - 1 x daily - 7 x weekly - 3 sets - 10 reps - 5 seconds hold - Bent Knee Fallouts with Alternating Legs  - 1 x daily - 7 x weekly - 3 sets - 10 reps - Standing Posterior Pelvic Tilt  - 3 x daily - 7 x weekly - 3 sets - 10 reps - 5 seconds hold  - Mini Squat with Counter Support  - 1 x daily - 7 x weekly - 3 sets - 10 reps  - Standing Hip Abduction with Resistance at Ankles and Counter Support  - 1 x daily - 7 x weekly - 1-3 sets - 10 reps - 5 seconds hold   Recommended for pt to perform scapular retractions and chin tucks at home (sitting) to help continue to decrease neck stiffness. Pt demonstrated and verbalized understanding.    - Seated Toe Raise  - 1 x daily - 7 x weekly - 1-3 sets - 10 reps - 5 seconds hold Then with folded towel under heel to promote more strength in more B ankle PF position (added on 10/30/2021)  - Long Sitting Ankle Eversion with Resistance  - 1 x daily - 7 x weekly - 2-3 sets - 10 reps  Yellow band     Seated B heel raises  5x5 seconds for 2 sets   Then with folded towel under heel to promote more strength in more B ankle PF position    5x5 seconds for 2 sets   PT Short Term Goals - 08/28/22 1617       PT SHORT TERM GOAL #1    Title Pt will be independent with his initial HEP to improve strength, balance, and function.    Baseline Pt has started his HEP. (07/24/2022); pt demonstrates independence with his inital HEP (08/28/2022)    Time 3    Period Weeks    Status Achieved    Target Date 08/18/22              PT Long Term Goals - 12/11/22 0825       PT LONG TERM GOAL #1  Title Pt will improve B ankle PF, DF, EV, and IV strength by at least 1/2 MMT grade to promote ability to ambulate, as well as perform standing tasks with less difficulty and decreased fall risk.    Baseline Manually resisted ankle: PF 3+/5 R, and L;  DF 3+/5 R, 3/5 L; EV 4-/5 R, 3+/5 L; IV 3+/5 R and L (07/24/2022); Manually resisted ankle PF 4-/5 R, 3+/5 L,  DF 3+/5 R, 3+/5 L, EV 4-/5 R, 3+/5 L, IV 3+/5 R, 3+/5 L (08/28/2022); Manually resisted ankle: PF 4/5 R, 4-/5 L, DF 3+/5 R, 3+/5 L, EV 4/5 R, 4-/5L, IV 4-/5 R, 4-/5 L (09/20/2022); Manually resisted ankle PF 4/5 R, 4-/5 L, DF 3+/5 R, 3+/5 L, EV 4/5 R, 4/5 L, IV 4/5 R, 4-/5 L (11/22/2022); PF 4/5 R, 4-/5 L, DF 3+/5 R, 3+/5 L, EV 4/5 R, 4/5 L, IV 4/5 R, 4/5 L (12/11/2022)    Time 8    Period Weeks    Status Partially Met    Target Date 01/19/23      PT LONG TERM GOAL #2   Title Pt will improve B hip extension and abduction strength to promote ability to perform standing tasks with less difficulty and decreased fall risk.    Baseline Hip extension 3+/5 R and L; hip abduction 4/5 R and L (07/24/2022); hip extension 4/5 R and L, hip abduction 4+/5 R and L (08/28/2022); Hip extension 4/5 R, 4/5 L, hip abduction 4+/5 R, 5/5 L (09/20/2022)    Time 8    Period Weeks    Status Achieved    Target Date 09/22/22      PT LONG TERM GOAL #3   Title Pt will improve his FOTO score by at least 10 points as a demonsration of improved function.    Baseline Foot FOTO 67 (07/24/2022); 67 (09/13/2022)    Time 8    Period Weeks    Status On-going    Target Date 01/19/23      PT LONG TERM GOAL #4   Title Pt will  improve his DGI score to at least 19 points as a demonstration of improved balance and decreased fall risk.    Baseline DGI 13 (07/24/2022); 16 (08/28/2022); 15 (11/22/2022); 20 (12/11/2022)    Time 8    Period Weeks    Status Achieved    Target Date 01/19/23              Plan - 12/18/22 1218     Personal Factors and Comorbidities Comorbidity 3+;Age;Past/Current Experience;Time since onset of injury/illness/exacerbation;Profession;Fitness    Comorbidities HTN, DM, neuropathy    Examination-Activity Limitations Stand;Lift;Carry;Bend;Locomotion Level;Dressing;Squat;Transfers;Stairs    Stability/Clinical Decision Making Stable/Uncomplicated   Balance seem to be worsening based on subjective reports.   Rehab Potential Fair    PT Frequency 2x / week    PT Duration 8 weeks    PT Treatment/Interventions Therapeutic exercise;Balance training;Functional mobility training;Stair training;Therapeutic activities;Neuromuscular re-education;Patient/family education;Manual techniques;Electrical Stimulation;Gait training    PT Next Visit Plan lumbopelvic strengthening, stability and control, B hip, thigh, and ankle strengthening, balance, gait, manual techniques, modalities PRN.    PT Home Exercise Plan Medbridge Access Code: [email or web address]    Consulted and Agree with Plan of Care Patient                       Loralyn Freshwater PT, DPT  12/18/2022, 12:20 PM

## 2022-12-20 ENCOUNTER — Telehealth: Payer: Self-pay | Admitting: Nurse Practitioner

## 2022-12-20 DIAGNOSIS — E1140 Type 2 diabetes mellitus with diabetic neuropathy, unspecified: Secondary | ICD-10-CM

## 2022-12-20 NOTE — Telephone Encounter (Signed)
Rose with [facility] services is calling in requesting a pre-authorization for pt's foot and ankle orthoses so he can receive the medical equipment.

## 2022-12-21 ENCOUNTER — Ambulatory Visit: Payer: Medicare Other

## 2022-12-21 DIAGNOSIS — Z9181 History of falling: Secondary | ICD-10-CM | POA: Diagnosis not present

## 2022-12-21 DIAGNOSIS — M6281 Muscle weakness (generalized): Secondary | ICD-10-CM

## 2022-12-21 DIAGNOSIS — R2681 Unsteadiness on feet: Secondary | ICD-10-CM | POA: Diagnosis not present

## 2022-12-21 DIAGNOSIS — R262 Difficulty in walking, not elsewhere classified: Secondary | ICD-10-CM | POA: Diagnosis not present

## 2022-12-21 NOTE — Therapy (Addendum)
OUTPATIENT PHYSICAL THERAPY TREATMENT NOTE    Patient Name: Tyler Morgan MRN: [id number] DOB:September 12, 1945, 77 y.o., male Today's Date: 12/21/2022  PCP: [name], NP  REFERRING PROVIDER: [name], NP   END OF SESSION:  PT End of Session - 12/21/22 0733     Visit Number 32    Number of Visits 49    Date for PT Re-Evaluation 01/19/23    PT Start Time 0734    PT Stop Time 0816    PT Time Calculation (min) 42 min    Activity Tolerance Patient tolerated treatment well    Behavior During Therapy [facility] for tasks assessed/performed                                          Past Medical History:  Diagnosis Date   Diabetes mellitus without complication (HCC)    ED (erectile dysfunction)    Hypertension    Motion sickness    boats   Neuropathy    Prostate enlargement    Urinary retention    Past Surgical History:  Procedure Laterality Date   BASAL CELL CARCINOMA EXCISION     nose    CATARACT EXTRACTION Bilateral 1986, 2014   COLONOSCOPY WITH PROPOFOL N/A 03/27/2018   Procedure: COLONOSCOPY WITH BIOPSIES;  Surgeon: Toney Reil, MD;  Location: [facility];  Service: Endoscopy;  Laterality: N/A;  Diabetic - oral meds   EYE SURGERY Bilateral [phone]   FRACTURE SURGERY Right    as a child/ right arm   HERNIA REPAIR Right 01-13-14   inguinal hernia   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 N/A 02/07/2017   Procedure: 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;  Surgeon: Vanna Scotland, MD;  Location: [facility];  Service: Urology;  Laterality: N/A;   POLYPECTOMY N/A 03/27/2018   Procedure: POLYPECTOMY;  Surgeon: Toney Reil, MD;  Location: [facility];  Service: Endoscopy;  Laterality: N/A;   RETINAL DETACHMENT SURGERY  Jan 2015   TONSILLECTOMY  1955   Patient Active Problem List   Diagnosis Date Noted   PVD (peripheral vascular disease) (HCC) 11/30/2021   B12  deficiency 01/29/2019   History of prostate cancer 12/13/2017   Hemorrhoids 12/13/2017   Hyperlipidemia associated with type 2 diabetes mellitus (HCC) 04/02/2017   Advanced care planning/counseling discussion 10/11/2016   Type 2 diabetes mellitus with diabetic neuropathy (HCC) 12/15/2014   Hypertension associated with diabetes (HCC)     REFERRING DIAG: E11.40 (ICD-10-CM) - Type 2 diabetes mellitus with diabetic neuropathy, without long-term current use of insulin (HCC)   THERAPY DIAG:  Unsteadiness on feet  Difficulty in walking, not elsewhere classified  Muscle weakness (generalized)  History of falling  Rationale for Evaluation and Treatment Rehabilitation  PERTINENT HISTORY: Balance, weakness. Can get around well without a cane. Standing still, pt loses balance. Has to hold onto things to stabilize himself. Has labradore thats about 77 year old and can walk his dog without a leash because the dog's pulling stabilizes him. Symptoms have been going on for a while and was told to have diabetic neuropathy. Had a fall in 2015 while working on the flooring in his den at home which caused R inguinal hernia S/P mesh placement. Pt states noticing problems in his legs since 2015. Pt also could not push up on his toes (tip toe) since then. No back pain  currently but has had a hx of back pain over the years. Decreased sensation B feet, almost not noticeable. Feels like he has a lot of weakness which makes it difficult to keep his balance. Has B LE swelling, comes and goes, not currently getting treated for it. No low back pain, just L arm and shoulder.   PRECAUTIONS: Fall risk; L5 anterolisthesis   SUBJECTIVE:   SUBJECTIVE STATEMENT:  Doing fine        PAIN:  Are you having pain? See subjective   TODAY'S TREATMENT:                                                                                                                                         DATE: 12/21/2022      Per MRI on  12/26/2016: 1. 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 There is probable chronic bilateral L5 nerve root encroachment.      No latex allergies.       Therapeutic exercise    SLS with contralateral UE assist (as little assist as possible)                R 30 seconds x 3                L 30 seconds x 3   Static standing  Staggerd stance about 20 seconds    R foot in front 2x   L foot in front 2x  Standing forward weight shift onto forefeet 5x15 seconds for 3 sets with UE light touch assist                  To promote increase use and strengthening of B gastroc/soleus muscles.   SLS with with B UE assist with contralateral foot on furniture slider  Cicles bothe directions   R 10x3   L 10x3  Four square step exercise CGA to min A                 Clockwise 5x                Counterclockwise 5x                Difficulty with with balance secondary to B ankle weakness. LOB multiple times with min A to recover.    Seated trunk flexion stretch 30 seconds x 3  Seated manually resisted ankle PF (concentric, isometric (about 5 second holds) and eccentric contraction)                 L 10x2                R 10x                          Improved exercise technique, movement at target  joints, use of target muscles after mod verbal, visual, tactile cues.         Response to treatment Pt tolerated session well without aggravation of symptoms.       Clinical impression Worked static standing balance as well as standing balance with multidirectional stepping. Difficulty secondary to B ankle weakness, especially with plantar flexion.  Pt tolerated session well without aggravation of symptoms.  Pt will benefit from skilled physical therapy services to improve strength, balance, and function.         PATIENT EDUCATION: Education details: there-ex, HEP Person educated:  Patient Education method: Explanation, Demonstration, Tactile cues, Verbal cues, and Handouts Education comprehension: verbalized understanding and returned demonstration  HOME EXERCISE PROGRAM: Access Code: [email or web address] URL: [email or web address] Date: 07/24/2022 Prepared by: Loralyn Freshwater   Exercises - Seated Transversus Abdominis Bracing  - 5 x daily - 7 x weekly - 3 sets - 10 reps - 5 seconds hold  - Supine Posterior Pelvic Tilt  - 1 x daily - 7 x weekly - 3 sets - 10 reps - 5 seconds hold - Long Sitting Ankle Plantar Flexion with Resistance  - 1 x daily - 7 x weekly - 3 sets - 10 reps  Upgraded to green band on 10/17/2022     - Long Sitting Ankle Dorsiflexion AROM  - 1 x daily - 7 x weekly - 3 sets - 10 reps - 5 seconds hold  - Supine March with Posterior Pelvic Tilt  - 1 x daily - 7 x weekly - 3 sets - 10 reps - Curl Up with Arms Crossed  - 1 x daily - 7 x weekly - 3 sets - 10 reps - Supine Double Knee to Chest  - 1 x daily - 7 x weekly - 3 sets - 10 reps - 5 seconds hold - Bent Knee Fallouts with Alternating Legs  - 1 x daily - 7 x weekly - 3 sets - 10 reps - Standing Posterior Pelvic Tilt  - 3 x daily - 7 x weekly - 3 sets - 10 reps - 5 seconds hold  - Mini Squat with Counter Support  - 1 x daily - 7 x weekly - 3 sets - 10 reps  - Standing Hip Abduction with Resistance at Ankles and Counter Support  - 1 x daily - 7 x weekly - 1-3 sets - 10 reps - 5 seconds hold   Recommended for pt to perform scapular retractions and chin tucks at home (sitting) to help continue to decrease neck stiffness. Pt demonstrated and verbalized understanding.    - Seated Toe Raise  - 1 x daily - 7 x weekly - 1-3 sets - 10 reps - 5 seconds hold Then with folded towel under heel to promote more strength in more B ankle PF position (added on 10/30/2021)  - Long Sitting Ankle Eversion with Resistance  - 1 x daily - 7 x weekly - 2-3 sets - 10 reps  Yellow band     Seated B heel  raises  5x5 seconds for 2 sets   Then with folded towel under heel to promote more strength in more B ankle PF position    5x5 seconds for 2 sets   PT Short Term Goals - 08/28/22 1617       PT SHORT TERM GOAL #1   Title Pt will be independent with his initial HEP to improve strength, balance, and function.    Baseline Pt has started his HEP. (07/24/2022); pt  demonstrates independence with his inital HEP (08/28/2022)    Time 3    Period Weeks    Status Achieved    Target Date 08/18/22              PT Long Term Goals - 12/11/22 0825       PT LONG TERM GOAL #1   Title Pt will improve B ankle PF, DF, EV, and IV strength by at least 1/2 MMT grade to promote ability to ambulate, as well as perform standing tasks with less difficulty and decreased fall risk.    Baseline Manually resisted ankle: PF 3+/5 R, and L;  DF 3+/5 R, 3/5 L; EV 4-/5 R, 3+/5 L; IV 3+/5 R and L (07/24/2022); Manually resisted ankle PF 4-/5 R, 3+/5 L,  DF 3+/5 R, 3+/5 L, EV 4-/5 R, 3+/5 L, IV 3+/5 R, 3+/5 L (08/28/2022); Manually resisted ankle: PF 4/5 R, 4-/5 L, DF 3+/5 R, 3+/5 L, EV 4/5 R, 4-/5L, IV 4-/5 R, 4-/5 L (09/20/2022); Manually resisted ankle PF 4/5 R, 4-/5 L, DF 3+/5 R, 3+/5 L, EV 4/5 R, 4/5 L, IV 4/5 R, 4-/5 L (11/22/2022); PF 4/5 R, 4-/5 L, DF 3+/5 R, 3+/5 L, EV 4/5 R, 4/5 L, IV 4/5 R, 4/5 L (12/11/2022)    Time 8    Period Weeks    Status Partially Met    Target Date 01/19/23      PT LONG TERM GOAL #2   Title Pt will improve B hip extension and abduction strength to promote ability to perform standing tasks with less difficulty and decreased fall risk.    Baseline Hip extension 3+/5 R and L; hip abduction 4/5 R and L (07/24/2022); hip extension 4/5 R and L, hip abduction 4+/5 R and L (08/28/2022); Hip extension 4/5 R, 4/5 L, hip abduction 4+/5 R, 5/5 L (09/20/2022)    Time 8    Period Weeks    Status Achieved    Target Date 09/22/22      PT LONG TERM GOAL #3   Title Pt will improve his FOTO score by at least  10 points as a demonsration of improved function.    Baseline Foot FOTO 67 (07/24/2022); 67 (09/13/2022)    Time 8    Period Weeks    Status On-going    Target Date 01/19/23      PT LONG TERM GOAL #4   Title Pt will improve his DGI score to at least 19 points as a demonstration of improved balance and decreased fall risk.    Baseline DGI 13 (07/24/2022); 16 (08/28/2022); 15 (11/22/2022); 20 (12/11/2022)    Time 8    Period Weeks    Status Achieved    Target Date 01/19/23                          Loralyn Freshwater PT, DPT  12/21/2022, 9:30 AM

## 2022-12-25 NOTE — Telephone Encounter (Signed)
Lets try Neurology with Dr. Cristopher Peru. His B ankle weakness seems to be neurological and Dr. Sherryll Burger seemed to be thinking of ankle orthoses as well based on his most recent note. Thanks

## 2022-12-26 ENCOUNTER — Telehealth: Payer: Self-pay

## 2022-12-26 ENCOUNTER — Ambulatory Visit: Payer: Medicare Other | Attending: Nurse Practitioner

## 2022-12-26 DIAGNOSIS — Z9181 History of falling: Secondary | ICD-10-CM | POA: Insufficient documentation

## 2022-12-26 DIAGNOSIS — R2681 Unsteadiness on feet: Secondary | ICD-10-CM | POA: Insufficient documentation

## 2022-12-26 DIAGNOSIS — R262 Difficulty in walking, not elsewhere classified: Secondary | ICD-10-CM | POA: Insufficient documentation

## 2022-12-26 DIAGNOSIS — M6281 Muscle weakness (generalized): Secondary | ICD-10-CM | POA: Insufficient documentation

## 2022-12-26 NOTE — Addendum Note (Signed)
Addended by: Aura Dials T on: 12/26/2022 11:32 AM   Modules accepted: Orders

## 2022-12-26 NOTE — Therapy (Signed)
OUTPATIENT PHYSICAL THERAPY  Screen   Patient Name: Tyler Morgan MRN: [id number] DOB:07-22-1945, 77 y.o., male Today's Date: 12/26/2022  PCP: [name], NP  REFERRING PROVIDER: [name], NP   END OF SESSION:  PT End of Session - 12/26/22 0731     Visit Number 32    Number of Visits 49    Date for PT Re-Evaluation 01/19/23    Authorization Type [employer]    Authorization Time Period 12/20/2022 to 01/17/2023    Authorization - Visit Number 0    Authorization - Number of Visits 4    PT Start Time 0731    PT Stop Time 0750    PT Time Calculation (min) 19 min    Activity Tolerance Patient tolerated treatment well    Behavior During Therapy [facility] for tasks assessed/performed                                           Past Medical History:  Diagnosis Date   Diabetes mellitus without complication (HCC)    ED (erectile dysfunction)    Hypertension    Motion sickness    boats   Neuropathy    Prostate enlargement    Urinary retention    Past Surgical History:  Procedure Laterality Date   BASAL CELL CARCINOMA EXCISION     nose    CATARACT EXTRACTION Bilateral 1986, 2014   COLONOSCOPY WITH PROPOFOL N/A 03/27/2018   Procedure: COLONOSCOPY WITH BIOPSIES;  Surgeon: Toney Reil, MD;  Location: [facility];  Service: Endoscopy;  Laterality: N/A;  Diabetic - oral meds   EYE SURGERY Bilateral [id number]   FRACTURE SURGERY Right    as a child/ right arm   HERNIA REPAIR Right 01-13-14   inguinal hernia   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 N/A 02/07/2017   Procedure: 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;  Surgeon: Vanna Scotland, MD;  Location: [facility];  Service: Urology;  Laterality: N/A;   POLYPECTOMY N/A 03/27/2018   Procedure: POLYPECTOMY;  Surgeon: Toney Reil, MD;  Location: [facility];  Service: Endoscopy;  Laterality: N/A;    RETINAL DETACHMENT SURGERY  Jan 2015   TONSILLECTOMY  1955   Patient Active Problem List   Diagnosis Date Noted   PVD (peripheral vascular disease) (HCC) 11/30/2021   B12 deficiency 01/29/2019   History of prostate cancer 12/13/2017   Hemorrhoids 12/13/2017   Hyperlipidemia associated with type 2 diabetes mellitus (HCC) 04/02/2017   Advanced care planning/counseling discussion 10/11/2016   Type 2 diabetes mellitus with diabetic neuropathy (HCC) 12/15/2014   Hypertension associated with diabetes (HCC)     REFERRING DIAG: E11.40 (ICD-10-CM) - Type 2 diabetes mellitus with diabetic neuropathy, without long-term current use of insulin (HCC)   THERAPY DIAG:  Unsteadiness on feet  Difficulty in walking, not elsewhere classified  Muscle weakness (generalized)  History of falling  Rationale for Evaluation and Treatment Rehabilitation  PERTINENT HISTORY: Balance, weakness. Can get around well without a cane. Standing still, pt loses balance. Has to hold onto things to stabilize himself. Has labradore thats about 77 year old and can walk his dog without a leash because the dog's pulling stabilizes him. Symptoms have been going on for a while and was told to have diabetic neuropathy. Had a fall in 2015 while working on the flooring in his den at  home which caused R inguinal hernia S/P mesh placement. Pt states noticing problems in his legs since 2015. Pt also could not push up on his toes (tip toe) since then. No back pain currently but has had a hx of back pain over the years. Decreased sensation B feet, almost not noticeable. Feels like he has a lot of weakness which makes it difficult to keep his balance. Has B LE swelling, comes and goes, not currently getting treated for it. No low back pain, just L arm and shoulder.   PRECAUTIONS: Fall risk; L5 anterolisthesis   SUBJECTIVE:   SUBJECTIVE STATEMENT:  Goes back to Dr. Margaretmary Eddy office for a nerve conduction test January 02, 2023.          PAIN:  Are you having pain? See subjective   TODAY'S TREATMENT:                                                                                                                                         DATE: 12/26/2022      Per MRI on 12/26/2016: 1. 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 There is probable chronic bilateral L5 nerve root encroachment.      No latex allergies.       Pt conversation:   Time spent talking to patient about insurance, as well as trying a prosthetist/orthotist (does pre-authorization) to obtain an AFO secondary to B ankle weakness to promote ability to ambulate with less difficulty with with more balance.   No treatment performed today to conserve visits.            PATIENT EDUCATION: Education details: there-ex, HEP Person educated: Patient Education method: Explanation, Demonstration, Tactile cues, Verbal cues, and Handouts Education comprehension: verbalized understanding and returned demonstration  HOME EXERCISE PROGRAM: Access Code: [email or web address] URL: [email or web address] Date: 07/24/2022 Prepared by: Loralyn Freshwater   Exercises - Seated Transversus Abdominis Bracing  - 5 x daily - 7 x weekly - 3 sets - 10 reps - 5 seconds hold  - Supine Posterior Pelvic Tilt  - 1 x daily - 7 x weekly - 3 sets - 10 reps - 5 seconds hold - Long Sitting Ankle Plantar Flexion with Resistance  - 1 x daily - 7 x weekly - 3 sets - 10 reps  Upgraded to green band on 10/17/2022     - Long Sitting Ankle Dorsiflexion AROM  - 1 x daily - 7 x weekly - 3 sets - 10 reps - 5 seconds hold  - Supine March with Posterior Pelvic Tilt  - 1 x daily - 7 x weekly - 3 sets - 10 reps - Curl Up with Arms Crossed  - 1 x daily - 7 x weekly - 3 sets - 10 reps - Supine Double Knee to Chest  - 1 x daily - 7 x weekly -  3 sets - 10 reps - 5 seconds hold - Bent Knee  Fallouts with Alternating Legs  - 1 x daily - 7 x weekly - 3 sets - 10 reps - Standing Posterior Pelvic Tilt  - 3 x daily - 7 x weekly - 3 sets - 10 reps - 5 seconds hold  - Mini Squat with Counter Support  - 1 x daily - 7 x weekly - 3 sets - 10 reps  - Standing Hip Abduction with Resistance at Ankles and Counter Support  - 1 x daily - 7 x weekly - 1-3 sets - 10 reps - 5 seconds hold   Recommended for pt to perform scapular retractions and chin tucks at home (sitting) to help continue to decrease neck stiffness. Pt demonstrated and verbalized understanding.    - Seated Toe Raise  - 1 x daily - 7 x weekly - 1-3 sets - 10 reps - 5 seconds hold Then with folded towel under heel to promote more strength in more B ankle PF position (added on 10/30/2021)  - Long Sitting Ankle Eversion with Resistance  - 1 x daily - 7 x weekly - 2-3 sets - 10 reps  Yellow band     Seated B heel raises  5x5 seconds for 2 sets   Then with folded towel under heel to promote more strength in more B ankle PF position    5x5 seconds for 2 sets   PT Short Term Goals - 08/28/22 1617       PT SHORT TERM GOAL #1   Title Pt will be independent with his initial HEP to improve strength, balance, and function.    Baseline Pt has started his HEP. (07/24/2022); pt demonstrates independence with his inital HEP (08/28/2022)    Time 3    Period Weeks    Status Achieved    Target Date 08/18/22              PT Long Term Goals - 12/11/22 0825       PT LONG TERM GOAL #1   Title Pt will improve B ankle PF, DF, EV, and IV strength by at least 1/2 MMT grade to promote ability to ambulate, as well as perform standing tasks with less difficulty and decreased fall risk.    Baseline Manually resisted ankle: PF 3+/5 R, and L;  DF 3+/5 R, 3/5 L; EV 4-/5 R, 3+/5 L; IV 3+/5 R and L (07/24/2022); Manually resisted ankle PF 4-/5 R, 3+/5 L,  DF 3+/5 R, 3+/5 L, EV 4-/5 R, 3+/5 L, IV 3+/5 R, 3+/5 L (08/28/2022); Manually resisted ankle:  PF 4/5 R, 4-/5 L, DF 3+/5 R, 3+/5 L, EV 4/5 R, 4-/5L, IV 4-/5 R, 4-/5 L (09/20/2022); Manually resisted ankle PF 4/5 R, 4-/5 L, DF 3+/5 R, 3+/5 L, EV 4/5 R, 4/5 L, IV 4/5 R, 4-/5 L (11/22/2022); PF 4/5 R, 4-/5 L, DF 3+/5 R, 3+/5 L, EV 4/5 R, 4/5 L, IV 4/5 R, 4/5 L (12/11/2022)    Time 8    Period Weeks    Status Partially Met    Target Date 01/19/23      PT LONG TERM GOAL #2   Title Pt will improve B hip extension and abduction strength to promote ability to perform standing tasks with less difficulty and decreased fall risk.    Baseline Hip extension 3+/5 R and L; hip abduction 4/5 R and L (07/24/2022); hip extension 4/5 R and L, hip abduction 4+/5 R  and L (08/28/2022); Hip extension 4/5 R, 4/5 L, hip abduction 4+/5 R, 5/5 L (09/20/2022)    Time 8    Period Weeks    Status Achieved    Target Date 09/22/22      PT LONG TERM GOAL #3   Title Pt will improve his FOTO score by at least 10 points as a demonsration of improved function.    Baseline Foot FOTO 67 (07/24/2022); 67 (09/13/2022)    Time 8    Period Weeks    Status On-going    Target Date 01/19/23      PT LONG TERM GOAL #4   Title Pt will improve his DGI score to at least 19 points as a demonstration of improved balance and decreased fall risk.    Baseline DGI 13 (07/24/2022); 16 (08/28/2022); 15 (11/22/2022); 20 (12/11/2022)    Time 8    Period Weeks    Status Achieved    Target Date 01/19/23                          Loralyn Freshwater PT, DPT  12/26/2022, 8:03 AM

## 2022-12-26 NOTE — Telephone Encounter (Signed)
Actually, you can try sending him to a prosthetist/orthotist such as the [facility] here in [place]. Based on the information on their website, they do pre-authorizations. They also seem to have great reviews from their patients. Thanks

## 2023-01-09 ENCOUNTER — Other Ambulatory Visit: Payer: Self-pay | Admitting: *Deleted

## 2023-01-09 ENCOUNTER — Ambulatory Visit: Payer: Medicare Other

## 2023-01-09 DIAGNOSIS — R262 Difficulty in walking, not elsewhere classified: Secondary | ICD-10-CM

## 2023-01-09 DIAGNOSIS — C61 Malignant neoplasm of prostate: Secondary | ICD-10-CM

## 2023-01-09 DIAGNOSIS — Z9181 History of falling: Secondary | ICD-10-CM | POA: Diagnosis not present

## 2023-01-09 DIAGNOSIS — R2681 Unsteadiness on feet: Secondary | ICD-10-CM

## 2023-01-09 DIAGNOSIS — R200 Anesthesia of skin: Secondary | ICD-10-CM | POA: Diagnosis not present

## 2023-01-09 DIAGNOSIS — M6281 Muscle weakness (generalized): Secondary | ICD-10-CM | POA: Diagnosis not present

## 2023-01-09 NOTE — Therapy (Signed)
OUTPATIENT PHYSICAL THERAPY TREATMENT NOTE    Patient Name: DELLA SZAKACS MRN: [id number] DOB:10/19/1945, 77 y.o., male Today's Date: 01/09/2023  PCP: [name], NP  REFERRING PROVIDER: [name], NP   END OF SESSION:  PT End of Session - 01/09/23 0812     Visit Number 33    Number of Visits 49    Date for PT Re-Evaluation 01/19/23    Authorization Type [employer]    Authorization Time Period 12/20/2022 to 01/17/2023    Authorization - Number of Visits 4    PT Start Time 0815    PT Stop Time 0900    PT Time Calculation (min) 45 min    Activity Tolerance Patient tolerated treatment well    Behavior During Therapy [facility] for tasks assessed/performed                 Past Medical History:  Diagnosis Date   Diabetes mellitus without complication (HCC)    ED (erectile dysfunction)    Hypertension    Motion sickness    boats   Neuropathy    Prostate enlargement    Urinary retention    Past Surgical History:  Procedure Laterality Date   BASAL CELL CARCINOMA EXCISION     nose    CATARACT EXTRACTION Bilateral 1986, 2014   COLONOSCOPY WITH PROPOFOL N/A 03/27/2018   Procedure: COLONOSCOPY WITH BIOPSIES;  Surgeon: Toney Reil, MD;  Location: [facility];  Service: Endoscopy;  Laterality: N/A;  Diabetic - oral meds   EYE SURGERY Bilateral [id number]   FRACTURE SURGERY Right    as a child/ right arm   HERNIA REPAIR Right 01-13-14   inguinal hernia   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 N/A 02/07/2017   Procedure: 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;  Surgeon: Vanna Scotland, MD;  Location: [facility];  Service: Urology;  Laterality: N/A;   POLYPECTOMY N/A 03/27/2018   Procedure: POLYPECTOMY;  Surgeon: Toney Reil, MD;  Location: [facility];  Service: Endoscopy;  Laterality: N/A;   RETINAL DETACHMENT SURGERY  Jan 2015   TONSILLECTOMY  1955   Patient Active  Problem List   Diagnosis Date Noted   PVD (peripheral vascular disease) (HCC) 11/30/2021   B12 deficiency 01/29/2019   History of prostate cancer 12/13/2017   Hemorrhoids 12/13/2017   Hyperlipidemia associated with type 2 diabetes mellitus (HCC) 04/02/2017   Advanced care planning/counseling discussion 10/11/2016   Type 2 diabetes mellitus with diabetic neuropathy (HCC) 12/15/2014   Hypertension associated with diabetes (HCC)     REFERRING DIAG: E11.40 (ICD-10-CM) - Type 2 diabetes mellitus with diabetic neuropathy, without long-term current use of insulin (HCC)   THERAPY DIAG:  Unsteadiness on feet  Difficulty in walking, not elsewhere classified  Muscle weakness (generalized)  History of falling  Rationale for Evaluation and Treatment Rehabilitation  PERTINENT HISTORY: Balance, weakness. Can get around well without a cane. Standing still, pt loses balance. Has to hold onto things to stabilize himself. Has labradore thats about 77 year old and can walk his dog without a leash because the dog's pulling stabilizes him. Symptoms have been going on for a while and was told to have diabetic neuropathy. Had a fall in 2015 while working on the flooring in his den at home which caused R inguinal hernia S/P mesh placement. Pt states noticing problems in his legs since 2015. Pt also could not push up on his toes (tip toe) since then. No back  pain currently but has had a hx of back pain over the years. Decreased sensation B feet, almost not noticeable. Feels like he has a lot of weakness which makes it difficult to keep his balance. Has B LE swelling, comes and goes, not currently getting treated for it. No low back pain, just L arm and shoulder.   PRECAUTIONS: Fall risk; L5 anterolisthesis   SUBJECTIVE:   SUBJECTIVE STATEMENT:  Pt reports that he has an appointment this afternoon for nerve conduction measurements.     PAIN: Are you having pain? See subjective   TODAY'S TREATMENT: DATE:  01/09/23   No latex allergies.    TherEx:  SLS with contralateral UE assist (as little assist as possible)  R 30 seconds x 3  L 30 seconds x 3  Staggered stance for ankle activation, 30 sec bouts x2 each LE  Standing heel lifts, with pt unable to perform but does engage the muscles, 2x10 Standing ankle lifts, with pt unable to perform but does engage the muscles, 2x10                  Neuro:  Activity Description: 4 corners set up for forward/backward/lateral and diagonal walking for improved glute strength/activation and balance while performing SLS Activity Setting:  Random Number of Pods:  4 Cycles/Sets:  2 Duration (Time or Hit Count):  20 hits                             Improved exercise technique, movement at target joints, use of target muscles after mod verbal, visual, tactile cues.     Response to treatment Pt tolerated session well without aggravation of symptoms.       Clinical impression  Pt performed well with the newly introduced balance related activities and only had one incident of near fall.  Pt able to correct with minA from therapist with use of gait belt.  Pt would benefit from continued lateral walking in order to improve glute strength, couple with ankle muscle activation in order to improve stability in standing.   Pt will continue to benefit from skilled therapy to address remaining deficits in order to improve overall QoL and return to PLOF.           PATIENT EDUCATION: Education details: there-ex, HEP Person educated: Patient Education method: Explanation, Demonstration, Tactile cues, Verbal cues, and Handouts Education comprehension: verbalized understanding and returned demonstration  HOME EXERCISE PROGRAM: Access Code: [email or web address] URL: [email or web address] Date: 07/24/2022 Prepared by: Loralyn Freshwater   Exercises - Seated Transversus Abdominis Bracing  - 5 x daily - 7 x weekly - 3 sets - 10 reps - 5 seconds  hold  - Supine Posterior Pelvic Tilt  - 1 x daily - 7 x weekly - 3 sets - 10 reps - 5 seconds hold - Long Sitting Ankle Plantar Flexion with Resistance  - 1 x daily - 7 x weekly - 3 sets - 10 reps  Upgraded to green band on 10/17/2022     - Long Sitting Ankle Dorsiflexion AROM  - 1 x daily - 7 x weekly - 3 sets - 10 reps - 5 seconds hold  - Supine March with Posterior Pelvic Tilt  - 1 x daily - 7 x weekly - 3 sets - 10 reps - Curl Up with Arms Crossed  - 1 x daily - 7 x weekly - 3 sets - 10 reps - Supine Double Knee to Chest  -  1 x daily - 7 x weekly - 3 sets - 10 reps - 5 seconds hold - Bent Knee Fallouts with Alternating Legs  - 1 x daily - 7 x weekly - 3 sets - 10 reps - Standing Posterior Pelvic Tilt  - 3 x daily - 7 x weekly - 3 sets - 10 reps - 5 seconds hold  - Mini Squat with Counter Support  - 1 x daily - 7 x weekly - 3 sets - 10 reps  - Standing Hip Abduction with Resistance at Ankles and Counter Support  - 1 x daily - 7 x weekly - 1-3 sets - 10 reps - 5 seconds hold   Recommended for pt to perform scapular retractions and chin tucks at home (sitting) to help continue to decrease neck stiffness. Pt demonstrated and verbalized understanding.    - Seated Toe Raise  - 1 x daily - 7 x weekly - 1-3 sets - 10 reps - 5 seconds hold Then with folded towel under heel to promote more strength in more B ankle PF position (added on 10/30/2021)  - Long Sitting Ankle Eversion with Resistance  - 1 x daily - 7 x weekly - 2-3 sets - 10 reps  Yellow band     Seated B heel raises  5x5 seconds for 2 sets   Then with folded towel under heel to promote more strength in more B ankle PF position    5x5 seconds for 2 sets   PT Short Term Goals - 08/28/22 1617       PT SHORT TERM GOAL #1   Title Pt will be independent with his initial HEP to improve strength, balance, and function.    Baseline Pt has started his HEP. (07/24/2022); pt demonstrates independence with his inital HEP (08/28/2022)     Time 3    Period Weeks    Status Achieved    Target Date 08/18/22              PT Long Term Goals - 12/11/22 0825       PT LONG TERM GOAL #1   Title Pt will improve B ankle PF, DF, EV, and IV strength by at least 1/2 MMT grade to promote ability to ambulate, as well as perform standing tasks with less difficulty and decreased fall risk.    Baseline Manually resisted ankle: PF 3+/5 R, and L;  DF 3+/5 R, 3/5 L; EV 4-/5 R, 3+/5 L; IV 3+/5 R and L (07/24/2022); Manually resisted ankle PF 4-/5 R, 3+/5 L,  DF 3+/5 R, 3+/5 L, EV 4-/5 R, 3+/5 L, IV 3+/5 R, 3+/5 L (08/28/2022); Manually resisted ankle: PF 4/5 R, 4-/5 L, DF 3+/5 R, 3+/5 L, EV 4/5 R, 4-/5L, IV 4-/5 R, 4-/5 L (09/20/2022); Manually resisted ankle PF 4/5 R, 4-/5 L, DF 3+/5 R, 3+/5 L, EV 4/5 R, 4/5 L, IV 4/5 R, 4-/5 L (11/22/2022); PF 4/5 R, 4-/5 L, DF 3+/5 R, 3+/5 L, EV 4/5 R, 4/5 L, IV 4/5 R, 4/5 L (12/11/2022)    Time 8    Period Weeks    Status Partially Met    Target Date 01/19/23      PT LONG TERM GOAL #2   Title Pt will improve B hip extension and abduction strength to promote ability to perform standing tasks with less difficulty and decreased fall risk.    Baseline Hip extension 3+/5 R and L; hip abduction 4/5 R and L (07/24/2022); hip extension 4/5  R and L, hip abduction 4+/5 R and L (08/28/2022); Hip extension 4/5 R, 4/5 L, hip abduction 4+/5 R, 5/5 L (09/20/2022)    Time 8    Period Weeks    Status Achieved    Target Date 09/22/22      PT LONG TERM GOAL #3   Title Pt will improve his FOTO score by at least 10 points as a demonsration of improved function.    Baseline Foot FOTO 67 (07/24/2022); 67 (09/13/2022)    Time 8    Period Weeks    Status On-going    Target Date 01/19/23      PT LONG TERM GOAL #4   Title Pt will improve his DGI score to at least 19 points as a demonstration of improved balance and decreased fall risk.    Baseline DGI 13 (07/24/2022); 16 (08/28/2022); 15 (11/22/2022); 20 (12/11/2022)    Time 8    Period  Weeks    Status Achieved    Target Date 01/19/23                Nolon Bussing, PT, DPT Physical Therapist - [facility]  [facility]  01/09/23, 10:15 AM

## 2023-01-10 ENCOUNTER — Other Ambulatory Visit: Payer: Medicare Other

## 2023-01-10 DIAGNOSIS — C61 Malignant neoplasm of prostate: Secondary | ICD-10-CM

## 2023-01-11 LAB — PSA: Prostate Specific Ag, Serum: 2.3 ng/mL (ref 0.0–4.0)

## 2023-01-12 ENCOUNTER — Other Ambulatory Visit: Payer: Medicare Other

## 2023-01-15 ENCOUNTER — Ambulatory Visit: Payer: Medicare Other

## 2023-01-15 DIAGNOSIS — Z9181 History of falling: Secondary | ICD-10-CM | POA: Diagnosis not present

## 2023-01-15 DIAGNOSIS — M6281 Muscle weakness (generalized): Secondary | ICD-10-CM

## 2023-01-15 DIAGNOSIS — R2681 Unsteadiness on feet: Secondary | ICD-10-CM | POA: Diagnosis not present

## 2023-01-15 DIAGNOSIS — R262 Difficulty in walking, not elsewhere classified: Secondary | ICD-10-CM

## 2023-01-15 NOTE — Therapy (Signed)
OUTPATIENT PHYSICAL THERAPY TREATMENT NOTE    Patient Name: Tyler Morgan MRN: [id number] DOB:1946-01-11, 77 y.o., male Today's Date: 01/15/2023  PCP: [name], NP  REFERRING PROVIDER: [name], NP   END OF SESSION:  PT End of Session - 01/15/23 0829     Visit Number 34    Number of Visits 49    Date for PT Re-Evaluation 01/19/23    Authorization Type [employer]    Authorization Time Period 12/20/2022 to 01/17/2023    Authorization - Number of Visits 4    PT Start Time 0829    PT Stop Time 0900    PT Time Calculation (min) 31 min    Activity Tolerance Patient tolerated treatment well    Behavior During Therapy [facility] for tasks assessed/performed                 Past Medical History:  Diagnosis Date   Diabetes mellitus without complication (HCC)    ED (erectile dysfunction)    Hypertension    Motion sickness    boats   Neuropathy    Prostate enlargement    Urinary retention    Past Surgical History:  Procedure Laterality Date   BASAL CELL CARCINOMA EXCISION     nose    CATARACT EXTRACTION Bilateral 1986, 2014   COLONOSCOPY WITH PROPOFOL N/A 03/27/2018   Procedure: COLONOSCOPY WITH BIOPSIES;  Surgeon: Toney Reil, MD;  Location: [facility];  Service: Endoscopy;  Laterality: N/A;  Diabetic - oral meds   EYE SURGERY Bilateral [phone]   FRACTURE SURGERY Right    as a child/ right arm   HERNIA REPAIR Right 01-13-14   inguinal hernia   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 N/A 02/07/2017   Procedure: 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;  Surgeon: Vanna Scotland, MD;  Location: [facility];  Service: Urology;  Laterality: N/A;   POLYPECTOMY N/A 03/27/2018   Procedure: POLYPECTOMY;  Surgeon: Toney Reil, MD;  Location: [facility];  Service: Endoscopy;  Laterality: N/A;   RETINAL DETACHMENT SURGERY  Jan 2015   TONSILLECTOMY  1955   Patient Active  Problem List   Diagnosis Date Noted   PVD (peripheral vascular disease) (HCC) 11/30/2021   B12 deficiency 01/29/2019   History of prostate cancer 12/13/2017   Hemorrhoids 12/13/2017   Hyperlipidemia associated with type 2 diabetes mellitus (HCC) 04/02/2017   Advanced care planning/counseling discussion 10/11/2016   Type 2 diabetes mellitus with diabetic neuropathy (HCC) 12/15/2014   Hypertension associated with diabetes (HCC)     REFERRING DIAG: E11.40 (ICD-10-CM) - Type 2 diabetes mellitus with diabetic neuropathy, without long-term current use of insulin (HCC)   THERAPY DIAG:  Unsteadiness on feet  Difficulty in walking, not elsewhere classified  Muscle weakness (generalized)  History of falling  Rationale for Evaluation and Treatment Rehabilitation  PERTINENT HISTORY: Balance, weakness. Can get around well without a cane. Standing still, pt loses balance. Has to hold onto things to stabilize himself. Has labradore thats about 77 year old and can walk his dog without a leash because the dog's pulling stabilizes him. Symptoms have been going on for a while and was told to have diabetic neuropathy. Had a fall in 2015 while working on the flooring in his den at home which caused R inguinal hernia S/P mesh placement. Pt states noticing problems in his legs since 2015. Pt also could not push up on his toes (tip toe) since then. No back  pain currently but has had a hx of back pain over the years. Decreased sensation B feet, almost not noticeable. Feels like he has a lot of weakness which makes it difficult to keep his balance. Has B LE swelling, comes and goes, not currently getting treated for it. No low back pain, just L arm and shoulder.   PRECAUTIONS: Fall risk; L5 anterolisthesis   SUBJECTIVE:   SUBJECTIVE STATEMENT:  Pt reports having some increased back pain.  Pt was active in push mowing the lawn and that flared up his symptoms.  Pt notes that the pain is right above the R  PSIS.   PAIN: Are you having pain? See subjective   TODAY'S TREATMENT: DATE: 01/15/23   No latex allergies.    TherEx:  Supine SKTC, 30 sec bouts x2 each LE Supine LTR, 2x10 each side Supine B hamstring stretch, SLR, 30 sec bouts Supine B hamstring stretch, bent knee, 30 sec bouts Supine B figure 4 stretch, 30 sec bouts Supine B piriformis stretch, 30 sec bouts    Neuro:  Activity Description: 4 corners set up for forward/backward/lateral and diagonal walking for improved glute strength/activation and balance while performing SLS Activity Setting:  Random Number of Pods:  4 Cycles/Sets:  2 Duration (Time or Hit Count):  20 & 25 hits     Improved exercise technique, movement at target joints, use of target muscles after mod verbal, visual, tactile cues.     Response to treatment Pt tolerated session well without aggravation of symptoms.       Clinical impression  Pt put forth great effort throughout the session today, although limited due to delayed check in.  Pt most limited by low back pain that was addressed with therapeutic exercises today as well.  Pt has significant limitations due to hamstring length and was given updated HEP in order to address those concerns moving forward.  Pt scheduled for discharge at next visit.   Pt will continue to benefit from skilled therapy to address remaining deficits in order to improve overall QoL and return to PLOF.        PATIENT EDUCATION: Education details: there-ex, HEP Person educated: Patient Education method: Explanation, Demonstration, Tactile cues, Verbal cues, and Handouts Education comprehension: verbalized understanding and returned demonstration  HOME EXERCISE PROGRAM: Access Code: [email or web address] URL: [email or web address] Date: 07/24/2022 Prepared by: Loralyn Freshwater   Exercises - Seated Transversus Abdominis Bracing  - 5 x daily - 7 x weekly - 3 sets - 10 reps - 5 seconds hold  - Supine  Posterior Pelvic Tilt  - 1 x daily - 7 x weekly - 3 sets - 10 reps - 5 seconds hold - Long Sitting Ankle Plantar Flexion with Resistance  - 1 x daily - 7 x weekly - 3 sets - 10 reps  Upgraded to green band on 10/17/2022     - Long Sitting Ankle Dorsiflexion AROM  - 1 x daily - 7 x weekly - 3 sets - 10 reps - 5 seconds hold  - Supine March with Posterior Pelvic Tilt  - 1 x daily - 7 x weekly - 3 sets - 10 reps - Curl Up with Arms Crossed  - 1 x daily - 7 x weekly - 3 sets - 10 reps - Supine Double Knee to Chest  - 1 x daily - 7 x weekly - 3 sets - 10 reps - 5 seconds hold - Bent Knee Fallouts with Alternating Legs  - 1 x daily - 7  x weekly - 3 sets - 10 reps - Standing Posterior Pelvic Tilt  - 3 x daily - 7 x weekly - 3 sets - 10 reps - 5 seconds hold  - Mini Squat with Counter Support  - 1 x daily - 7 x weekly - 3 sets - 10 reps  - Standing Hip Abduction with Resistance at Ankles and Counter Support  - 1 x daily - 7 x weekly - 1-3 sets - 10 reps - 5 seconds hold   Recommended for pt to perform scapular retractions and chin tucks at home (sitting) to help continue to decrease neck stiffness. Pt demonstrated and verbalized understanding.    - Seated Toe Raise  - 1 x daily - 7 x weekly - 1-3 sets - 10 reps - 5 seconds hold Then with folded towel under heel to promote more strength in more B ankle PF position (added on 10/30/2021)  - Long Sitting Ankle Eversion with Resistance  - 1 x daily - 7 x weekly - 2-3 sets - 10 reps  Yellow band     Seated B heel raises  5x5 seconds for 2 sets   Then with folded towel under heel to promote more strength in more B ankle PF position    5x5 seconds for 2 sets   Access Code: [email or web address] URL: [email or web address] Date: 01/15/2023 Prepared by: Tomasa Hose  Exercises - Seated Hamstring Stretch  - 1 x daily - 7 x weekly - 3 sets - 10 reps - Hooklying Active Hamstring Stretch  - 1 x daily - 7 x weekly - 3 sets - 10 reps - Supine  Hamstring Stretch with Strap  - 1 x daily - 7 x weekly - 3 sets - 10 reps   PT Short Term Goals - 08/28/22 1617       PT SHORT TERM GOAL #1   Title Pt will be independent with his initial HEP to improve strength, balance, and function.    Baseline Pt has started his HEP. (07/24/2022); pt demonstrates independence with his inital HEP (08/28/2022)    Time 3    Period Weeks    Status Achieved    Target Date 08/18/22              PT Long Term Goals - 12/11/22 0825       PT LONG TERM GOAL #1   Title Pt will improve B ankle PF, DF, EV, and IV strength by at least 1/2 MMT grade to promote ability to ambulate, as well as perform standing tasks with less difficulty and decreased fall risk.    Baseline Manually resisted ankle: PF 3+/5 R, and L;  DF 3+/5 R, 3/5 L; EV 4-/5 R, 3+/5 L; IV 3+/5 R and L (07/24/2022); Manually resisted ankle PF 4-/5 R, 3+/5 L,  DF 3+/5 R, 3+/5 L, EV 4-/5 R, 3+/5 L, IV 3+/5 R, 3+/5 L (08/28/2022); Manually resisted ankle: PF 4/5 R, 4-/5 L, DF 3+/5 R, 3+/5 L, EV 4/5 R, 4-/5L, IV 4-/5 R, 4-/5 L (09/20/2022); Manually resisted ankle PF 4/5 R, 4-/5 L, DF 3+/5 R, 3+/5 L, EV 4/5 R, 4/5 L, IV 4/5 R, 4-/5 L (11/22/2022); PF 4/5 R, 4-/5 L, DF 3+/5 R, 3+/5 L, EV 4/5 R, 4/5 L, IV 4/5 R, 4/5 L (12/11/2022)    Time 8    Period Weeks    Status Partially Met    Target Date 01/19/23      PT LONG TERM GOAL #2  Title Pt will improve B hip extension and abduction strength to promote ability to perform standing tasks with less difficulty and decreased fall risk.    Baseline Hip extension 3+/5 R and L; hip abduction 4/5 R and L (07/24/2022); hip extension 4/5 R and L, hip abduction 4+/5 R and L (08/28/2022); Hip extension 4/5 R, 4/5 L, hip abduction 4+/5 R, 5/5 L (09/20/2022)    Time 8    Period Weeks    Status Achieved    Target Date 09/22/22      PT LONG TERM GOAL #3   Title Pt will improve his FOTO score by at least 10 points as a demonsration of improved function.    Baseline Foot FOTO 67  (07/24/2022); 67 (09/13/2022)    Time 8    Period Weeks    Status On-going    Target Date 01/19/23      PT LONG TERM GOAL #4   Title Pt will improve his DGI score to at least 19 points as a demonstration of improved balance and decreased fall risk.    Baseline DGI 13 (07/24/2022); 16 (08/28/2022); 15 (11/22/2022); 20 (12/11/2022)    Time 8    Period Weeks    Status Achieved    Target Date 01/19/23                Nolon Bussing, PT, DPT Physical Therapist - [facility]  01/15/23, 9:50 AM

## 2023-01-16 ENCOUNTER — Ambulatory Visit: Payer: Medicare Other | Admitting: Urology

## 2023-01-16 VITALS — BP 120/69 | HR 69 | Ht 72.75 in | Wt 178.2 lb

## 2023-01-16 DIAGNOSIS — R350 Frequency of micturition: Secondary | ICD-10-CM | POA: Diagnosis not present

## 2023-01-16 DIAGNOSIS — N401 Enlarged prostate with lower urinary tract symptoms: Secondary | ICD-10-CM | POA: Diagnosis not present

## 2023-01-16 DIAGNOSIS — C61 Malignant neoplasm of prostate: Secondary | ICD-10-CM

## 2023-01-16 NOTE — Progress Notes (Signed)
Tyler Morgan,acting as a scribe for Tyler Scotland, MD.,have documented all relevant documentation on the behalf of Tyler Scotland, MD,as directed by  Tyler Scotland, MD while in the presence of Tyler Scotland, MD.  01/16/2023 10:41 AM   Tyler Morgan May 15, 1945 [id number]  Referring provider: [name], NP [address] [place],  [place] [address]  Chief Complaint  Patient presents with   Prostate Cancer   Benign Prostatic Hypertrophy    HPI: 77 year-old male who follow up today for routine annual follow up. He has a personal history of incidental low-risk prostate cancer at the time of HoLEP in 01/2017.   He is s/p of laser nucleation of massive prostate 01/2017 with incidental low risk prostate cancer returns today for surveillance.   A total of 114 g were resected.  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   He also has a personal history of elevated PSA initiaially post-op PSA was 0.9 08/2017 on finasteride.    As further evaluation for this, he underwent prostate MRI on 12/05/2018.  This revealed a 2.2 cm peripheral zone nodule at the left lateral and posterior mid apex which was read as of PI-RADS 5 lesion.  No evidence of extracapsular extension or neurovascular involvement.  No lymphadenopathy.  Estimated residual prostatic volume 74 cc.  He underwent cognitive fusion biopsy which demonstrated some prostatic asymmetry, left greater than right.  Several additional samples of the left area of interest were biopsied but combined with the usual 12 core biopsy.  No complications. Pathology was benign.   His most recent PSA on 01/10/2023 remains stable at 2.3. His creatinine is normal.  Today, he has no urinary complaints.  He is not on any BPH medications.    Prostate Specific Ag, Serum  Latest Ref Rng 0.0 - 4.0 ng/mL  03/28/2018 2.0   10/23/2018 4.8  (H)   11/20/2018 4.9 (H)   03/25/2019 4.8 (H)   07/07/2019 2.9   01/27/2020 2.6   08/03/2020 2.9   11/29/2020 10.5 (H)   12/22/2020 2.4   06/27/2021 2.8   12/29/2021 2.1   07/11/2022 1.9   01/10/2023 2.3      PMH: Past Medical History:  Diagnosis Date   Diabetes mellitus without complication [facility])    ED (erectile dysfunction)    Hypertension    Motion sickness    boats   Neuropathy    Prostate enlargement    Urinary retention     Surgical History: Past Surgical History:  Procedure Laterality Date   BASAL CELL CARCINOMA EXCISION     nose    CATARACT EXTRACTION Bilateral 1986, 2014   COLONOSCOPY WITH PROPOFOL N/A 03/27/2018   Procedure: COLONOSCOPY WITH BIOPSIES;  Surgeon: Toney Reil, MD;  Location: [facility];  Service: Endoscopy;  Laterality: N/A;  Diabetic - oral meds   EYE SURGERY Bilateral [phone]   FRACTURE SURGERY Right    as a child/ right arm   HERNIA REPAIR Right 01-13-14   inguinal hernia   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 N/A 02/07/2017   Procedure: 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;  Surgeon: Tyler Scotland, MD;  Location: [facility];  Service: Urology;  Laterality: N/A;   POLYPECTOMY N/A 03/27/2018   Procedure: POLYPECTOMY;  Surgeon: Toney Reil, MD;  Location: [facility];  Service: Endoscopy;  Laterality: N/A;   RETINAL DETACHMENT SURGERY  Jan 2015   TONSILLECTOMY  1955    Home Medications:  Allergies as of 01/16/2023       Reactions   Acetaminophen Swelling, Other (See Comments)   Swelling of liver    Amlodipine Swelling        Medication List        Accurate as of January 16, 2023 10:41 AM. If you have any questions, ask your nurse or doctor.          Alpha-Lipoic Acid 50 MG Tabs Take by mouth.   aspirin EC 81 MG tablet Take 81 mg by mouth daily. Swallow whole.   atenolol 50 MG tablet Commonly known as: TENORMIN Take 1 tablet (50 mg total) by mouth  daily.   benazepril 40 MG tablet Commonly known as: LOTENSIN Take 1 tablet (40 mg total) by mouth daily.   cyanocobalamin 1000 MCG tablet Commonly known as: VITAMIN B12 Take 1,000 mcg by mouth daily.   empagliflozin 10 MG Tabs tablet Commonly known as: Jardiance Take 1 tablet (10 mg total) by mouth daily before breakfast.   hydrochlorothiazide 12.5 MG tablet Commonly known as: HYDRODIURIL Take 1 tablet (12.5 mg total) by mouth daily.   metFORMIN 500 MG tablet Commonly known as: GLUCOPHAGE TAKE 1 TABLET BY MOUTH THREE TIMES DAILY   rosuvastatin 10 MG tablet Commonly known as: CRESTOR TAKE ONE (1) TABLET BY MOUTH THREE TIMESPER WEEK   VITAMIN D PO Take 500 Units by mouth daily.        Allergies:  Allergies  Allergen Reactions   Acetaminophen Swelling and Other (See Comments)    Swelling of liver    Amlodipine Swelling    Family History: Family History  Problem Relation Age of Onset   Colon cancer Father    Angina Father    Macular degeneration Father    Alzheimer's disease Mother    Prostate cancer Neg Hx    Bladder Cancer Neg Hx    Kidney cancer Neg Hx     Social History:  reports that he quit smoking about 38 years ago. His smoking use included cigarettes. He started smoking about 51 years ago. He has a 13 pack-year smoking history. He has never used smokeless tobacco. He reports current alcohol use of about 2.0 standard drinks of alcohol per week. He reports that he does not use drugs.   Physical Exam: BP 120/69   Pulse 69   Ht 6' 0.75" (1.848 m)   Wt 178 lb 4 oz (80.9 kg)   BMI 23.68 kg/m   Constitutional:  Alert and oriented, No acute distress. HEENT: [place] AT, moist mucus membranes.  Trachea midline, no masses. Neurologic: Grossly intact, no focal deficits, moving all 4 extremities. Psychiatric: Normal mood and affect.   Assessment & Plan:    1. Prostate cancer - Defer DRE based on PSA stability - Continue to trend his PSA  biannually -Incidental, low risk identified at the time of HoLEP, subsequent workup has been reassuring/negative -Continue active surveillance  2. BPH - Status post HoLEP in 2018 - Currently reports well-controlled urinary symptoms   Return in about 6 months (around 07/16/2023) for lab visit with PSA. Office visit in 1 year with repeat PSA.  I have reviewed the above documentation for accuracy and completeness, and I agree with the above.   Tyler Scotland, MD   [facility] [address] [place], [place] [address] [phone]

## 2023-01-17 ENCOUNTER — Ambulatory Visit: Payer: Medicare Other

## 2023-01-17 DIAGNOSIS — R2681 Unsteadiness on feet: Secondary | ICD-10-CM | POA: Diagnosis not present

## 2023-01-17 DIAGNOSIS — M6281 Muscle weakness (generalized): Secondary | ICD-10-CM | POA: Diagnosis not present

## 2023-01-17 DIAGNOSIS — Z9181 History of falling: Secondary | ICD-10-CM

## 2023-01-17 DIAGNOSIS — R262 Difficulty in walking, not elsewhere classified: Secondary | ICD-10-CM | POA: Diagnosis not present

## 2023-01-17 NOTE — Therapy (Signed)
OUTPATIENT PHYSICAL THERAPY TREATMENT NOTE    Patient Name: Tyler Morgan MRN: [id number] DOB:1945/08/17, 77 y.o., male Today's Date: 01/17/2023  PCP: [name], NP  REFERRING PROVIDER: [name], NP   END OF SESSION:  PT End of Session - 01/17/23 0819     Visit Number 35    Number of Visits 49    Date for PT Re-Evaluation 01/19/23    Authorization Type [employer]    Authorization Time Period 12/20/2022 to 01/17/2023    Authorization - Number of Visits 4    PT Start Time 0816    PT Stop Time 0830    PT Time Calculation (min) 14 min    Activity Tolerance Patient tolerated treatment well    Behavior During Therapy [facility] for tasks assessed/performed               Past Medical History:  Diagnosis Date   Diabetes mellitus without complication (HCC)    ED (erectile dysfunction)    Hypertension    Motion sickness    boats   Neuropathy    Prostate enlargement    Urinary retention    Past Surgical History:  Procedure Laterality Date   BASAL CELL CARCINOMA EXCISION     nose    CATARACT EXTRACTION Bilateral 1986, 2014   COLONOSCOPY WITH PROPOFOL N/A 03/27/2018   Procedure: COLONOSCOPY WITH BIOPSIES;  Surgeon: Toney Reil, MD;  Location: [facility];  Service: Endoscopy;  Laterality: N/A;  Diabetic - oral meds   EYE SURGERY Bilateral [id number]   FRACTURE SURGERY Right    as a child/ right arm   HERNIA REPAIR Right 01-13-14   inguinal hernia   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 N/A 02/07/2017   Procedure: 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;  Surgeon: Vanna Scotland, MD;  Location: [facility];  Service: Urology;  Laterality: N/A;   POLYPECTOMY N/A 03/27/2018   Procedure: POLYPECTOMY;  Surgeon: Toney Reil, MD;  Location: [facility];  Service: Endoscopy;  Laterality: N/A;   RETINAL DETACHMENT SURGERY  Jan 2015   TONSILLECTOMY  1955   Patient Active Problem  List   Diagnosis Date Noted   PVD (peripheral vascular disease) (HCC) 11/30/2021   B12 deficiency 01/29/2019   History of prostate cancer 12/13/2017   Hemorrhoids 12/13/2017   Hyperlipidemia associated with type 2 diabetes mellitus (HCC) 04/02/2017   Advanced care planning/counseling discussion 10/11/2016   Type 2 diabetes mellitus with diabetic neuropathy (HCC) 12/15/2014   Hypertension associated with diabetes (HCC)     REFERRING DIAG: E11.40 (ICD-10-CM) - Type 2 diabetes mellitus with diabetic neuropathy, without long-term current use of insulin (HCC)   THERAPY DIAG:  Unsteadiness on feet  Difficulty in walking, not elsewhere classified  Muscle weakness (generalized)  History of falling  Rationale for Evaluation and Treatment Rehabilitation  PERTINENT HISTORY: Balance, weakness. Can get around well without a cane. Standing still, pt loses balance. Has to hold onto things to stabilize himself. Has labradore thats about 77 year old and can walk his dog without a leash because the dog's pulling stabilizes him. Symptoms have been going on for a while and was told to have diabetic neuropathy. Had a fall in 2015 while working on the flooring in his den at home which caused R inguinal hernia S/P mesh placement. Pt states noticing problems in his legs since 2015. Pt also could not push up on his toes (tip toe) since then. No back pain currently  but has had a hx of back pain over the years. Decreased sensation B feet, almost not noticeable. Feels like he has a lot of weakness which makes it difficult to keep his balance. Has B LE swelling, comes and goes, not currently getting treated for it. No low back pain, just L arm and shoulder.   PRECAUTIONS: Fall risk; L5 anterolisthesis   SUBJECTIVE:   SUBJECTIVE STATEMENT:    Pt reports that he is having some low back pain today and would prefer to just do goal assessment.   PAIN: Are you having pain? See subjective   TODAY'S  TREATMENT: DATE: 01/17/23   No latex allergies.    TherAct:  Goal assessment performed and noted below.    Improved exercise technique, movement at target joints, use of target muscles after mod verbal, visual, tactile cues.     Response to treatment Pt tolerated session well without aggravation of symptoms.       Clinical impression  Pt hs made minimal progress towards goals throughout time spent in clinic.  Pt currently at end of certification with insurance and is unable to continue according to pt.  Pt is scheduled to have an AFO fitting/consultation in a few weeks to assist with the foot drop that the pt experiences.  Pt advised to contact the clinic if any needs arise.  Pt is formally discharged at this time.      PATIENT EDUCATION: Education details: there-ex, HEP Person educated: Patient Education method: Explanation, Demonstration, Tactile cues, Verbal cues, and Handouts Education comprehension: verbalized understanding and returned demonstration  HOME EXERCISE PROGRAM: Access Code: [email or web address] URL: [email or web address] Date: 07/24/2022 Prepared by: Loralyn Freshwater   Exercises - Seated Transversus Abdominis Bracing  - 5 x daily - 7 x weekly - 3 sets - 10 reps - 5 seconds hold  - Supine Posterior Pelvic Tilt  - 1 x daily - 7 x weekly - 3 sets - 10 reps - 5 seconds hold - Long Sitting Ankle Plantar Flexion with Resistance  - 1 x daily - 7 x weekly - 3 sets - 10 reps  Upgraded to green band on 10/17/2022     - Long Sitting Ankle Dorsiflexion AROM  - 1 x daily - 7 x weekly - 3 sets - 10 reps - 5 seconds hold  - Supine March with Posterior Pelvic Tilt  - 1 x daily - 7 x weekly - 3 sets - 10 reps - Curl Up with Arms Crossed  - 1 x daily - 7 x weekly - 3 sets - 10 reps - Supine Double Knee to Chest  - 1 x daily - 7 x weekly - 3 sets - 10 reps - 5 seconds hold - Bent Knee Fallouts with Alternating Legs  - 1 x daily - 7 x weekly - 3 sets - 10 reps -  Standing Posterior Pelvic Tilt  - 3 x daily - 7 x weekly - 3 sets - 10 reps - 5 seconds hold  - Mini Squat with Counter Support  - 1 x daily - 7 x weekly - 3 sets - 10 reps  - Standing Hip Abduction with Resistance at Ankles and Counter Support  - 1 x daily - 7 x weekly - 1-3 sets - 10 reps - 5 seconds hold   Recommended for pt to perform scapular retractions and chin tucks at home (sitting) to help continue to decrease neck stiffness. Pt demonstrated and verbalized understanding.    - Seated Toe Raise  -  1 x daily - 7 x weekly - 1-3 sets - 10 reps - 5 seconds hold Then with folded towel under heel to promote more strength in more B ankle PF position (added on 10/30/2021)  - Long Sitting Ankle Eversion with Resistance  - 1 x daily - 7 x weekly - 2-3 sets - 10 reps  Yellow band     Seated B heel raises  5x5 seconds for 2 sets   Then with folded towel under heel to promote more strength in more B ankle PF position    5x5 seconds for 2 sets   Access Code: [email or web address] URL: [email or web address] Date: 01/15/2023 Prepared by: Tomasa Hose  Exercises - Seated Hamstring Stretch  - 1 x daily - 7 x weekly - 3 sets - 10 reps - Hooklying Active Hamstring Stretch  - 1 x daily - 7 x weekly - 3 sets - 10 reps - Supine Hamstring Stretch with Strap  - 1 x daily - 7 x weekly - 3 sets - 10 reps   PT Short Term Goals - 08/28/22 1617       PT SHORT TERM GOAL #1   Title Pt will be independent with his initial HEP to improve strength, balance, and function.    Baseline Pt has started his HEP. (07/24/2022); pt demonstrates independence with his inital HEP (08/28/2022)    Time 3    Period Weeks    Status Achieved    Target Date 08/18/22              PT Long Term Goals - 12/11/22 0825       PT LONG TERM GOAL #1   Title Pt will improve B ankle PF, DF, EV, and IV strength by at least 1/2 MMT grade to promote ability to ambulate, as well as perform standing tasks with less  difficulty and decreased fall risk.    Baseline Manually resisted ankle: PF 3+/5 R, and L;  DF 3+/5 R, 3/5 L; EV 4-/5 R, 3+/5 L; IV 3+/5 R and L (07/24/2022); Manually resisted ankle PF 4-/5 R, 3+/5 L,  DF 3+/5 R, 3+/5 L, EV 4-/5 R, 3+/5 L, IV 3+/5 R, 3+/5 L (08/28/2022); Manually resisted ankle: PF 4/5 R, 4-/5 L, DF 3+/5 R, 3+/5 L, EV 4/5 R, 4-/5L, IV 4-/5 R, 4-/5 L (09/20/2022); Manually resisted ankle PF 4/5 R, 4-/5 L, DF 3+/5 R, 3+/5 L, EV 4/5 R, 4/5 L, IV 4/5 R, 4-/5 L (11/22/2022); PF 4/5 R, 4-/5 L, DF 3+/5 R, 3+/5 L, EV 4/5 R, 4/5 L, IV 4/5 R, 4/5 L (12/11/2022)  01/17/23: Globally, 4-/5 in B feet   Time 8    Period Weeks    Status Partially Met    Target Date 01/19/23      PT LONG TERM GOAL #2   Title Pt will improve B hip extension and abduction strength to promote ability to perform standing tasks with less difficulty and decreased fall risk.    Baseline Hip extension 3+/5 R and L; hip abduction 4/5 R and L (07/24/2022); hip extension 4/5 R and L, hip abduction 4+/5 R and L (08/28/2022); Hip extension 4/5 R, 4/5 L, hip abduction 4+/5 R, 5/5 L (09/20/2022)  01/17/23: Hip extension 4/5 R, 4/5 L, hip abduction 4+/5 R, 5/5 L   Time 8    Period Weeks    Status Achieved    Target Date 09/22/22      PT LONG TERM GOAL #3   Title Pt  will improve his FOTO score by at least 10 points as a demonsration of improved function.    Baseline Foot FOTO 67 (07/24/2022); 67 (09/13/2022)  01/17/23: 67   Time 8    Period Weeks    Status On-going    Target Date 01/19/23      PT LONG TERM GOAL #4   Title Pt will improve his DGI score to at least 19 points as a demonstration of improved balance and decreased fall risk.    Baseline DGI 13 (07/24/2022); 16 (08/28/2022); 15 (11/22/2022); 20 (12/11/2022)    Time 8    Period Weeks    Status Achieved    Target Date 01/19/23                Nolon Bussing, PT, DPT Physical Therapist - [facility]  01/17/23, 8:20 AM

## 2023-01-30 ENCOUNTER — Other Ambulatory Visit: Payer: Self-pay | Admitting: Nurse Practitioner

## 2023-01-31 NOTE — Telephone Encounter (Signed)
Requested by interface surescripts. Future visit in 1 month.  Requested Prescriptions  Pending Prescriptions Disp Refills   rosuvastatin (CRESTOR) 10 MG tablet [Pharmacy Med Name: ROSUVASTATIN CALCIUM 10 MG TAB] 12 tablet 4    Sig: TAKE ONE TABLET BY MOUTH 3 TIMES PER WEEK     Cardiovascular:  Antilipid - Statins 2 Failed - 01/30/2023  1:55 PM      Failed - Lipid Panel in normal range within the last 12 months    Cholesterol, Total  Date Value Ref Range Status  12/04/2022 148 100 - 199 mg/dL Final   Cholesterol Piccolo, Waived  Date Value Ref Range Status  07/30/2018 131 <200 mg/dL Final    Comment:                            Desirable                <200                         Borderline High      200- 239                         High                     >239    LDL Chol Calc (NIH)  Date Value Ref Range Status  12/04/2022 73 0 - 99 mg/dL Final   HDL  Date Value Ref Range Status  12/04/2022 48 >39 mg/dL Final   Triglycerides  Date Value Ref Range Status  12/04/2022 157 (H) 0 - 149 mg/dL Final   Triglycerides Piccolo,Waived  Date Value Ref Range Status  07/30/2018 121 <150 mg/dL Final    Comment:                            Normal                   <150                         Borderline High     150 - 199                         High                200 - 499                         Very High                >499          Passed - Cr in normal range and within 360 days    Creatinine  Date Value Ref Range Status  01/05/2014 0.75 0.60 - 1.30 mg/dL Final   Creatinine, Ser  Date Value Ref Range Status  12/04/2022 0.80 0.76 - 1.27 mg/dL Final         Passed - Patient is not pregnant      Passed - Valid encounter within last 12 months    Recent Outpatient Visits           1 month ago Medicare annual wellness visit, subsequent   [facility] [place], Dorie Rank, NP  4 months ago Hypertension associated with diabetes (HCC)   [place]  [facility] [place], [place] T, NP   5 months ago Type 2 diabetes mellitus with diabetic neuropathy, without long-term current use of insulin (HCC)   [place] [facility] [place], Eagar T, NP   8 months ago Type 2 diabetes mellitus with diabetic neuropathy, without long-term current use of insulin (HCC)   [facility] [place], Ridott T, NP   1 year ago Type 2 diabetes mellitus with diabetic neuropathy, without long-term current use of insulin (HCC)   [place] [facility] [place], Dorie Rank, NP       Future Appointments             In 1 month [place], Dorie Rank, NP [facility]   In 11 months Vanna Scotland, MD [facility]

## 2023-02-02 ENCOUNTER — Encounter: Payer: Self-pay | Admitting: Nurse Practitioner

## 2023-03-03 NOTE — Patient Instructions (Signed)
 Be Involved in Caring For Your Health:  Taking Medications When medications are taken as directed, they can greatly improve your health. But if they are not taken as prescribed, they may not work. In some cases, not taking them correctly can be harmful. To help ensure your treatment remains effective and safe, understand your medications and how to take them. Bring your medications to each visit for review by your provider.  Your lab results, notes, and after visit summary will be available on My Chart. We strongly encourage you to use this feature. If lab results are abnormal the clinic will contact you with the appropriate steps. If the clinic does not contact you assume the results are satisfactory. You can always view your results on My Chart. If you have questions regarding your health or results, please contact the clinic during office hours. You can also ask questions on My Chart.  We at [facility] are grateful that you chose [place] to provide your care. We strive to provide evidence-based and compassionate care and are always looking for feedback. If you get a survey from the clinic please complete this so we can hear your opinions.  Diabetes Mellitus and Foot Care Diabetes, also called diabetes mellitus, may cause problems with your feet and legs because of poor blood flow (circulation). Poor circulation may make your skin: Become thinner and drier. Break more easily. Heal more slowly. Peel and crack. You may also have nerve damage (neuropathy). This can cause decreased feeling in your legs and feet. This means that you may not notice minor injuries to your feet that could lead to more serious problems. Finding and treating problems early is the best way to prevent future foot problems. How to care for your feet Foot hygiene  Wash your feet daily with warm water and mild soap. Do not use hot water. Then, pat your feet and the areas between your toes until they are fully dry. Do  not soak your feet. This can dry your skin. Trim your toenails straight across. Do not dig under them or around the cuticle. File the edges of your nails with an emery board or nail file. Apply a moisturizing lotion or petroleum jelly to the skin on your feet and to dry, brittle toenails. Use lotion that does not contain alcohol and is unscented. Do not apply lotion between your toes. Shoes and socks Wear clean socks or stockings every day. Make sure they are not too tight. Do not wear knee-high stockings. These may decrease blood flow to your legs. Wear shoes that fit well and have enough cushioning. Always look in your shoes before you put them on to be sure there are no objects inside. To break in new shoes, wear them for just a few hours a day. This prevents injuries on your feet. Wounds, scrapes, corns, and calluses  Check your feet daily for blisters, cuts, bruises, sores, and redness. If you cannot see the bottom of your feet, use a mirror or ask someone for help. Do not cut off corns or calluses or try to remove them with medicine. If you find a minor scrape, cut, or break in the skin on your feet, keep it and the skin around it clean and dry. You may clean these areas with mild soap and water. Do not clean the area with peroxide, alcohol, or iodine. If you have a wound, scrape, corn, or callus on your foot, look at it several times a day to make sure it  is healing and not infected. Check for: Redness, swelling, or pain. Fluid or blood. Warmth. Pus or a bad smell. General tips Do not cross your legs. This may decrease blood flow to your feet. Do not use heating pads or hot water bottles on your feet. They may burn your skin. If you have lost feeling in your feet or legs, you may not know this is happening until it is too late. Protect your feet from hot and cold by wearing shoes, such as at the beach or on hot pavement. Schedule a complete foot exam at least once a year or more often if  you have foot problems. Report any cuts, sores, or bruises to your health care provider right away. Where to find more information American Diabetes Association: diabetes.org Association of Diabetes Care & Education Specialists: diabeteseducator.org Contact a health care provider if: You have a condition that increases your risk of infection, and you have any cuts, sores, or bruises on your feet. You have an injury that is not healing. You have redness on your legs or feet. You feel burning or tingling in your legs or feet. You have pain or cramps in your legs and feet. Your legs or feet are numb. Your feet always feel cold. You have pain around any toenails. Get help right away if: You have a wound, scrape, corn, or callus on your foot and: You have signs of infection. You have a fever. You have a red line going up your leg. This information is not intended to replace advice given to you by your health care provider. Make sure you discuss any questions you have with your health care provider. Document Revised: 08/10/2021 Document Reviewed: 08/10/2021 Elsevier Patient Education  2024 [employer].

## 2023-03-06 ENCOUNTER — Ambulatory Visit (INDEPENDENT_AMBULATORY_CARE_PROVIDER_SITE_OTHER): Payer: Medicare Other | Admitting: Nurse Practitioner

## 2023-03-06 ENCOUNTER — Encounter: Payer: Self-pay | Admitting: Nurse Practitioner

## 2023-03-06 VITALS — BP 121/69 | HR 69 | Temp 97.7°F | Ht 72.0 in | Wt 177.0 lb

## 2023-03-06 DIAGNOSIS — E785 Hyperlipidemia, unspecified: Secondary | ICD-10-CM | POA: Diagnosis not present

## 2023-03-06 DIAGNOSIS — Z7984 Long term (current) use of oral hypoglycemic drugs: Secondary | ICD-10-CM | POA: Diagnosis not present

## 2023-03-06 DIAGNOSIS — I152 Hypertension secondary to endocrine disorders: Secondary | ICD-10-CM | POA: Diagnosis not present

## 2023-03-06 DIAGNOSIS — E1140 Type 2 diabetes mellitus with diabetic neuropathy, unspecified: Secondary | ICD-10-CM | POA: Diagnosis not present

## 2023-03-06 DIAGNOSIS — Z1211 Encounter for screening for malignant neoplasm of colon: Secondary | ICD-10-CM

## 2023-03-06 DIAGNOSIS — G629 Polyneuropathy, unspecified: Secondary | ICD-10-CM

## 2023-03-06 DIAGNOSIS — R1310 Dysphagia, unspecified: Secondary | ICD-10-CM | POA: Diagnosis not present

## 2023-03-06 DIAGNOSIS — E119 Type 2 diabetes mellitus without complications: Secondary | ICD-10-CM | POA: Diagnosis not present

## 2023-03-06 DIAGNOSIS — E1169 Type 2 diabetes mellitus with other specified complication: Secondary | ICD-10-CM | POA: Diagnosis not present

## 2023-03-06 DIAGNOSIS — Z8546 Personal history of malignant neoplasm of prostate: Secondary | ICD-10-CM

## 2023-03-06 DIAGNOSIS — H9193 Unspecified hearing loss, bilateral: Secondary | ICD-10-CM

## 2023-03-06 DIAGNOSIS — E1159 Type 2 diabetes mellitus with other circulatory complications: Secondary | ICD-10-CM | POA: Diagnosis not present

## 2023-03-06 NOTE — Assessment & Plan Note (Signed)
 Chronic, stable.  BP well at goal in office today.  Urine ALB 22 March 2023.  Continue current medication regimen at this time, Benazepril  offering kidney protection + Hydrochlorothiazide  12.5 MG daily which has helped lower BP.  Can not take Amlodipine, although this would offer benefit.  Recommend checking BP a few mornings a week at home and documenting + focus on DASH diet.  Monitor for worsening edema and may provide PRN Lasix  if presents, suspect some underlying PVD based on exam.  Recommend avoid Ibuprofen products and high salt intake.  Return in 3 months.  Labs today: CMP.

## 2023-03-06 NOTE — Assessment & Plan Note (Signed)
Chronic, ongoing.  Myalgia presenting with daily Atorvastatin in past.  Continue Rosuvastatin 10 MG three days a week, educated him on this.  If myalgia with 3 days a week dosing then will trial one day a week, if ongoing myalgia then stop and consider Zetia or injectable.

## 2023-03-06 NOTE — Assessment & Plan Note (Signed)
 Diagnosed by neurology.  New referral to PT placed and will continue collaboration with neurology.  Continue B12 and Vitamin D.

## 2023-03-06 NOTE — Progress Notes (Addendum)
 BP 121/69   Pulse 69   Temp 97.7 F (36.5 C) (Oral)   Ht 6' (1.829 m)   Wt 177 lb (80.3 kg)   SpO2 96%   BMI 24.01 kg/m    Subjective:    Patient ID: Tyler Morgan, male    DOB: Feb 04, 1946, 78 y.o.   MRN: [id number]  HPI: Tyler Morgan is a 78 y.o. male  Chief Complaint  Patient presents with   Diabetes   Hypertension   Hyperlipidemia   PT and hearing referrals Reports hearing is getting a lot worse, would like to pursue testing. Losing high frequencies and has tinnitus.  Giving more trouble this year.  Has had a few episodes of dysphagia recently, which he had in past with eating apples -- in past stopped these which helped.  Currently is working on eating smaller portions and chewing well. This has peaked and gone away now, improved.  DIABETES A1c September 7.5%.  Continues Metformin  500 MG TID and Jardiance  which restarted a few months back due to elevations in A1c.  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 Taking Alpha Lipoic Acid and B12 daily.  Sees neurology on Monday. PT offered benefit last year and would like to return. Hypoglycemic episodes:no Polydipsia/polyuria: no Visual disturbance: no Chest pain: no Paresthesias: no Glucose Monitoring: no  Accucheck frequency: Not Checking  Fasting glucose:  Post prandial:  Evening:  Before meals: Taking Insulin?: no  Long acting insulin:  Short acting insulin: Blood Pressure Monitoring: rarely Retinal Examination: Up To Date - [facility] goes next week Foot Exam: Up to Date Pneumovax: Up to Date Influenza: Up to Date Aspirin: no   HYPERTENSION / HYPERLIPIDEMIA Continues on Benazepril , Atenolol , + Crestor .   Satisfied with current treatment? yes Duration of hypertension: chronic BP monitoring frequency: not checking BP range:  BP medication side effects: no Duration of hyperlipidemia: chronic Cholesterol medication side effects: no Cholesterol supplements: none Medication  compliance: good compliance Aspirin: no Recent stressors: no Recurrent headaches: no Visual changes: no Palpitations: no Dyspnea: no Chest pain: no Lower extremity edema: no Dizzy/lightheaded: no   PROSTATE CANCER: Follows with urology and last saw 01/16/23, was told he was cancer free in 2021.  Continues to have monitoring of PSA, every 6 months, recent November 2.3.  Denies any symptoms at this time.   Relevant past medical, surgical, family and social history reviewed and updated as indicated. Interim medical history since our last visit reviewed. Allergies and medications reviewed and updated.  Review of Systems  Constitutional:  Negative for activity change, diaphoresis, fatigue and fever.  Respiratory:  Negative for cough, chest tightness, shortness of breath and wheezing.   Cardiovascular:  Negative for chest pain, palpitations and leg swelling.  Gastrointestinal: Negative.   Endocrine: Negative for cold intolerance, heat intolerance, polydipsia, polyphagia and polyuria.  Neurological: Negative.   Psychiatric/Behavioral: Negative.     Per HPI unless specifically indicated above     Objective:    BP 121/69   Pulse 69   Temp 97.7 F (36.5 C) (Oral)   Ht 6' (1.829 m)   Wt 177 lb (80.3 kg)   SpO2 96%   BMI 24.01 kg/m   Wt Readings from Last 3 Encounters:  03/06/23 177 lb (80.3 kg)  01/16/23 178 lb 4 oz (80.9 kg)  12/04/22 184 lb 3.2 oz (83.6 kg)    Physical Exam Vitals and nursing note reviewed.  Constitutional:      General: He is awake. He is  not in acute distress.    Appearance: He is well-developed and well-groomed. He is obese. He is not ill-appearing or toxic-appearing.  HENT:     Head: Normocephalic and atraumatic.     Right Ear: Hearing, tympanic membrane, ear canal and external ear normal. No drainage. There is no impacted cerumen.     Left Ear: Hearing, tympanic membrane, ear canal and external ear normal. No drainage. There is no impacted cerumen.      Ears:     Comments: Minimal cerumen noted to both ears. Eyes:     General: Lids are normal.        Right eye: No discharge.        Left eye: No discharge.     Conjunctiva/sclera: Conjunctivae normal.     Pupils: Pupils are equal, round, and reactive to light.  Neck:     Thyroid : No thyromegaly.     Vascular: No carotid bruit.  Cardiovascular:     Rate and Rhythm: Normal rate and regular rhythm.     Heart sounds: Normal heart sounds, S1 normal and S2 normal. No murmur heard.    No gallop.  Pulmonary:     Effort: Pulmonary effort is normal. No accessory muscle usage or respiratory distress.     Breath sounds: Normal breath sounds.  Abdominal:     General: Bowel sounds are normal.     Palpations: Abdomen is soft.  Musculoskeletal:        General: Normal range of motion.     Cervical back: Normal range of motion and neck supple.     Right lower leg: No edema.     Left lower leg: No edema.  Skin:    General: Skin is warm and dry.  Neurological:     Mental Status: He is alert and oriented to person, place, and time.     Deep Tendon Reflexes: Reflexes are normal and symmetric.  Psychiatric:        Attention and Perception: Attention normal.        Mood and Affect: Mood normal.        Speech: Speech normal.        Behavior: Behavior normal. Behavior is cooperative.        Thought Content: Thought content normal.    Results for orders placed or performed in visit on 01/10/23  PSA   Collection Time: 01/10/23  8:16 AM  Result Value Ref Range   Prostate Specific Ag, Serum 2.3 0.0 - 4.0 ng/mL      Assessment & Plan:   Problem List Items Addressed This Visit       Cardiovascular and Mediastinum   Hypertension associated with diabetes (HCC)   Chronic, stable.  BP well at goal in office today.  Urine ALB 22 March 2023.  Continue current medication regimen at this time, Benazepril  offering kidney protection + Hydrochlorothiazide  12.5 MG daily which has helped lower BP.  Can  not take Amlodipine, although this would offer benefit.  Recommend checking BP a few mornings a week at home and documenting + focus on DASH diet.  Monitor for worsening edema and may provide PRN Lasix  if presents, suspect some underlying PVD based on exam.  Recommend avoid Ibuprofen products and high salt intake.  Return in 3 months.  Labs today: CMP.      Relevant Orders   Bayer DCA Hb A1c Waived   Microalbumin, Urine Waived   Comprehensive metabolic panel     Endocrine   Diabetes mellitus  treated with oral medication (HCC)   Refer to diabetes with neuropathy plan of care.      Hyperlipidemia associated with type 2 diabetes mellitus (HCC)   Chronic, ongoing.  Myalgia presenting with daily Atorvastatin  in past.  Continue Rosuvastatin  10 MG three days a week, educated him on this.  If myalgia with 3 days a week dosing then will trial one day a week, if ongoing myalgia then stop and consider Zetia or injectable.      Relevant Orders   Bayer DCA Hb A1c Waived   Comprehensive metabolic panel   Lipid Panel w/o Chol/HDL Ratio   Type 2 diabetes mellitus with diabetic neuropathy (HCC) - Primary   Chronic, ongoing with A1c 6.6% today, trend down, urine ALB 30 in January 2025.  Continue Metformin  TID and restart Jardiance  at 10 MG daily, he tolerated this in past.  Recommend he check BS at least three mornings a week and document.  If elevation above goal A1c next visit then increase Jardiance .  Continue PT for neuropathy as offering benefit and visits with neurology.  Return in 3 months for A1c check due to trend up. 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  New referral to PT placed and will continue collaboration with neurology.  Continue B12 and Vitamin D.        Other   History of prostate  cancer   Stable.  Recent visit with Dr. Penne and is cancer free.  Continue collaboration with urology and PSA with them.      Other Visit Diagnoses       Colon cancer screening       GI referral placed to discuss.   Relevant Orders   Ambulatory referral to Gastroenterology        Follow up plan: Return in about 3 months (around 06/04/2023) for T2DM, HTN/HLD.

## 2023-03-06 NOTE — Assessment & Plan Note (Signed)
 Stable.  Recent visit with Dr. Apolinar Junes and is cancer free.  Continue collaboration with urology and PSA with them.

## 2023-03-06 NOTE — Assessment & Plan Note (Signed)
 Refer to diabetes with neuropathy plan of care

## 2023-03-06 NOTE — Assessment & Plan Note (Signed)
 Chronic, ongoing with A1c 6.6% today, trend down, urine ALB 30 in January 2025.  Continue Metformin  TID and restart Jardiance  at 10 MG daily, he tolerated this in past.  Recommend he check BS at least three mornings a week and document.  If elevation above goal A1c next visit then increase Jardiance .  Continue PT for neuropathy as offering benefit and visits with neurology.  Return in 3 months for A1c check due to trend up. - ACE and statin on board - Eye and foot exams up to date - Vaccinations up to date

## 2023-03-06 NOTE — Addendum Note (Signed)
 Addended by: Aura Dials T on: 03/06/2023 07:12 PM   Modules accepted: Orders

## 2023-03-07 DIAGNOSIS — M21372 Foot drop, left foot: Secondary | ICD-10-CM | POA: Diagnosis not present

## 2023-03-07 DIAGNOSIS — M21371 Foot drop, right foot: Secondary | ICD-10-CM | POA: Diagnosis not present

## 2023-03-07 LAB — LIPID PANEL W/O CHOL/HDL RATIO
Cholesterol, Total: 166 mg/dL (ref 100–199)
HDL: 49 mg/dL (ref >39)
LDL Chol Calc (NIH): 92 mg/dL (ref 0–99)
Triglycerides: 143 mg/dL (ref 0–149)
VLDL Cholesterol Cal: 25 mg/dL (ref 5–40)

## 2023-03-07 LAB — COMPREHENSIVE METABOLIC PANEL
ALT: 14 [IU]/L (ref 0–44)
AST: 16 [IU]/L (ref 0–40)
Albumin: 4.5 g/dL (ref 3.8–4.8)
Alkaline Phosphatase: 59 [IU]/L (ref 44–121)
BUN/Creatinine Ratio: 25 — ABNORMAL HIGH (ref 10–24)
BUN: 19 mg/dL (ref 8–27)
Bilirubin Total: 0.5 mg/dL (ref 0.0–1.2)
CO2: 25 mmol/L (ref 20–29)
Calcium: 9.7 mg/dL (ref 8.6–10.2)
Chloride: 102 mmol/L (ref 96–106)
Creatinine, Ser: 0.75 mg/dL — ABNORMAL LOW (ref 0.76–1.27)
Globulin, Total: 2 g/dL (ref 1.5–4.5)
Glucose: 100 mg/dL — ABNORMAL HIGH (ref 70–99)
Potassium: 4.1 mmol/L (ref 3.5–5.2)
Sodium: 140 mmol/L (ref 134–144)
Total Protein: 6.5 g/dL (ref 6.0–8.5)
eGFR: 93 mL/min/{1.73_m2} (ref >59)

## 2023-03-07 LAB — MICROALBUMIN, URINE WAIVED
Creatinine, Urine Waived: 100 mg/dL (ref 10–300)
Microalb, Ur Waived: 30 mg/L — ABNORMAL HIGH (ref 0–19)
Microalb/Creat Ratio: <30 mg/g (ref <30)

## 2023-03-07 LAB — BAYER DCA HB A1C WAIVED: HB A1C (BAYER DCA - WAIVED): 6.6 % — ABNORMAL HIGH (ref 4.8–5.6)

## 2023-03-07 NOTE — Progress Notes (Signed)
 Contacted via MyChart   Good morning Joe, your labs have returned and overall remain stable.  Any questions? Keep being stellar!!  Thank you for allowing me to participate in your care.  I appreciate you. Kindest regards, Pratt Bress

## 2023-03-12 DIAGNOSIS — R200 Anesthesia of skin: Secondary | ICD-10-CM | POA: Diagnosis not present

## 2023-03-12 DIAGNOSIS — R202 Paresthesia of skin: Secondary | ICD-10-CM | POA: Diagnosis not present

## 2023-03-12 DIAGNOSIS — R2689 Other abnormalities of gait and mobility: Secondary | ICD-10-CM | POA: Diagnosis not present

## 2023-03-12 DIAGNOSIS — G629 Polyneuropathy, unspecified: Secondary | ICD-10-CM | POA: Diagnosis not present

## 2023-03-20 DIAGNOSIS — G5603 Carpal tunnel syndrome, bilateral upper limbs: Secondary | ICD-10-CM | POA: Diagnosis not present

## 2023-03-20 DIAGNOSIS — R200 Anesthesia of skin: Secondary | ICD-10-CM | POA: Diagnosis not present

## 2023-03-21 ENCOUNTER — Encounter: Payer: Self-pay | Admitting: *Deleted

## 2023-03-31 ENCOUNTER — Encounter: Payer: Self-pay | Admitting: Nurse Practitioner

## 2023-04-10 DIAGNOSIS — G5603 Carpal tunnel syndrome, bilateral upper limbs: Secondary | ICD-10-CM | POA: Diagnosis not present

## 2023-04-12 DIAGNOSIS — G5603 Carpal tunnel syndrome, bilateral upper limbs: Secondary | ICD-10-CM | POA: Diagnosis not present

## 2023-06-02 NOTE — Patient Instructions (Incomplete)
Be Involved in Caring For Your Health:  Taking Medications When medications are taken as directed, they can greatly improve your health. But if they are not taken as prescribed, they may not work. In some cases, not taking them correctly can be harmful. To help ensure your treatment remains effective and safe, understand your medications and how to take them. Bring your medications to each visit for review by your provider.  Your lab results, notes, and after visit summary will be available on My Chart. We strongly encourage you to use this feature. If lab results are abnormal the clinic will contact you with the appropriate steps. If the clinic does not contact you assume the results are satisfactory. You can always view your results on My Chart. If you have questions regarding your health or results, please contact the clinic during office hours. You can also ask questions on My Chart.  We at [facility] are grateful that you chose [place] to provide your care. We strive to provide evidence-based and compassionate care and are always looking for feedback. If you get a survey from the clinic please complete this so we can hear your opinions.  Diabetes Mellitus and Exercise Regular exercise is important for your health, especially if you have diabetes mellitus. Exercise is not just about losing weight. It can also help you increase muscle strength and bone density and reduce body fat and stress. This can help your level of endurance and make you more fit and flexible. Why should I exercise if I have diabetes? Exercise has many benefits for people with diabetes. It can: Help lower and control your blood sugar (glucose). Help your body respond better and become more sensitive to the hormone insulin. Reduce how much insulin your body needs. Lower your risk for heart disease by: Lowering how much "bad" cholesterol and triglycerides you have in your body. Increasing how much "good" cholesterol  you have in your body. Lowering your blood pressure. Lowering your blood glucose levels. What is my activity plan? Your health care provider or an expert trained in diabetes care (certified diabetes educator) can help you make an activity plan. This plan can help you find the type of exercise that works for you. It may also tell you how often to exercise and for how long. Be sure to: Get at least 150 minutes of medium-intensity or high-intensity exercise each week. This may involve brisk walking, biking, or water aerobics. Do stretching and strengthening exercises at least 2 times a week. This may involve yoga or weight lifting. Spread out your activity over at least 3 days of the week. Get some form of physical activity each day. Do not go more than 2 days in a row without some kind of activity. Avoid being inactive for more than 30 minutes at a time. Take frequent breaks to walk or stretch. Choose activities that you enjoy. Set goals that you know you can accomplish. Start slowly and increase the intensity of your exercise over time. How do I manage my diabetes during exercise?  Monitor your blood glucose Check your blood glucose before and after you exercise. If your blood glucose is 240 mg/dL (40.9 mmol/L) or higher before you exercise, check your urine for ketones. These are chemicals created by the liver. If you have ketones in your urine, do not exercise until your blood glucose returns to normal. If your blood glucose is 100 mg/dL (5.6 mmol/L) or lower, eat a snack that has 15-20 grams of carbohydrate in  it. Check your blood glucose 15 minutes after the snack to make sure that your level is above 100 mg/dL (5.6 mmol/L) before you start to exercise. Your risk for low blood glucose (hypoglycemia) goes up during and after exercise. Know the symptoms of this condition and how to treat it. Follow these instructions at home: Keep a carbohydrate snack on hand for use before, during, and after  exercise. This can help prevent or treat hypoglycemia. Avoid injecting insulin into parts of your body that are going to be used during exercise. This may include: Your arms, when you are going to play tennis. Your legs, when you are about to go jogging. Keep track of your exercise habits. This can help you and your health care provider watch and adjust your activity plan. Write down: What you eat before and after you exercise. Blood glucose levels before and after you exercise. The type and amount of exercise you do. Talk to your health care provider before you start a new activity. They may need to: Make sure that the activity is safe for you. Adjust your insulin, other medicines, and food that you eat. Drink water while you exercise. This can stop you from losing too much water (dehydration). It can also prevent problems caused by having a lot of heat in your body (heat stroke). Where to find more information American Diabetes Association: diabetes.org Association of Diabetes Care & Education Specialists: diabeteseducator.org This information is not intended to replace advice given to you by your health care provider. Make sure you discuss any questions you have with your health care provider. Document Revised: 07/27/2021 Document Reviewed: 07/27/2021 Elsevier Patient Education  2024 [employer].

## 2023-06-05 ENCOUNTER — Ambulatory Visit: Admitting: Nurse Practitioner

## 2023-06-05 ENCOUNTER — Encounter: Payer: Self-pay | Admitting: Nurse Practitioner

## 2023-06-05 VITALS — BP 100/58 | HR 69 | Temp 98.0°F | Ht 72.0 in | Wt 172.8 lb

## 2023-06-05 DIAGNOSIS — I739 Peripheral vascular disease, unspecified: Secondary | ICD-10-CM

## 2023-06-05 DIAGNOSIS — I152 Hypertension secondary to endocrine disorders: Secondary | ICD-10-CM

## 2023-06-05 DIAGNOSIS — E1159 Type 2 diabetes mellitus with other circulatory complications: Secondary | ICD-10-CM | POA: Diagnosis not present

## 2023-06-05 DIAGNOSIS — Z7984 Long term (current) use of oral hypoglycemic drugs: Secondary | ICD-10-CM

## 2023-06-05 DIAGNOSIS — E1169 Type 2 diabetes mellitus with other specified complication: Secondary | ICD-10-CM

## 2023-06-05 DIAGNOSIS — G629 Polyneuropathy, unspecified: Secondary | ICD-10-CM

## 2023-06-05 DIAGNOSIS — E1140 Type 2 diabetes mellitus with diabetic neuropathy, unspecified: Secondary | ICD-10-CM

## 2023-06-05 DIAGNOSIS — E785 Hyperlipidemia, unspecified: Secondary | ICD-10-CM

## 2023-06-05 DIAGNOSIS — W57XXXA Bitten or stung by nonvenomous insect and other nonvenomous arthropods, initial encounter: Secondary | ICD-10-CM

## 2023-06-05 DIAGNOSIS — E119 Type 2 diabetes mellitus without complications: Secondary | ICD-10-CM

## 2023-06-05 DIAGNOSIS — Z8546 Personal history of malignant neoplasm of prostate: Secondary | ICD-10-CM

## 2023-06-05 NOTE — Assessment & Plan Note (Signed)
 Refer to diabetes with neuropathy plan of care

## 2023-06-05 NOTE — Assessment & Plan Note (Signed)
Chronic, ongoing.  Myalgia presenting with daily Atorvastatin in past.  Continue Rosuvastatin 10 MG three days a week, educated him on this.  If myalgia with 3 days a week dosing then will trial one day a week, if ongoing myalgia then stop and consider Zetia or injectable.

## 2023-06-05 NOTE — Assessment & Plan Note (Signed)
Suspect some underlying PVD based on exam with diminished lower extremity hair pattern, edema at ankles, and dry skin.  At this time monitor for wounds and continue frequent foot exams.  To notify provider if any wounds present or pain.

## 2023-06-05 NOTE — Assessment & Plan Note (Addendum)
 Chronic, stable.  BP on lower side today, suspect related to Jardiance - is having less edema.  Urine ALB 22 March 2023.  Continue current medication regimen at this time, Benazepril offering kidney protection + Hydrochlorothiazide 12.5 MG daily which has helped lower BP.  Cannot take Amlodipine, although this would offer benefit.  Recommend checking BP a few mornings a week at home and documenting + focus on DASH diet.  Monitor for worsening edema and may provide PRN Lasix if presents, suspect some underlying PVD based on exam.  Recommend avoid Ibuprofen products and high salt intake.  Return in 3 months.  Labs today: CMP.  Will trial off Jardiance and monitor BP closely.

## 2023-06-05 NOTE — Assessment & Plan Note (Signed)
 Diagnosed by neurology, continue collaboration with them.  Recent notes reviewed.  Continue current medication and supplement regimen.

## 2023-06-05 NOTE — Assessment & Plan Note (Signed)
 Chronic, ongoing with A1c 6% today, trend down from 6.6%.  Urine ALB 30 in January 2025.  Continue Metformin TID and will trial back off Jardiance as he is concerned about weight loss.  Recommend he check BS at least three mornings a week and document.  If elevation above goal A1c next visit then may need to restart Jardiance.  Continue collaboration with neurology, recent notes reviewed.  Return in 3 months. - ACE and statin on board - Eye and foot exams up to date - Vaccinations up to date

## 2023-06-05 NOTE — Assessment & Plan Note (Signed)
 Stable.  Recent visit with Dr. Apolinar Junes and is cancer free.  Continue collaboration with urology and PSA with them.

## 2023-06-05 NOTE — Progress Notes (Signed)
 BP (!) 100/58 (BP Location: Left Arm, Patient Position: Sitting)   Pulse 69   Temp 98 F (36.7 C) (Oral)   Ht 6' (1.829 m)   Wt 172 lb 12.8 oz (78.4 kg)   SpO2 96%   BMI 23.44 kg/m    Subjective:    Patient ID: Tyler Morgan, male    DOB: Jan 30, 1946, 78 y.o.   MRN: [id number]  HPI: Tyler Morgan is a 78 y.o. male  Chief Complaint  Patient presents with   Diabetes   Hyperlipidemia   Hypertension   DIABETES A1c January was 6.6%.  Taking Metformin 500 MG TID and Jardiance (feels is losing weight with this, did in past).  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 Taking Alpha Lipoic Acid and B12 daily. Last neurology visit was 03/12/23 -- received injection to left wrist for carpal tunnel and is going for PT assessment. Continues further work-up for sensory changes as main issue is to left side.  Has been losing weight, lost 12 pounds since October 2024 which concerns him.  He eats 5 meals a day, has always been an eater he reports.  Something bit him yesterday and has some swelling to areas on left forearm and to neck area. Hypoglycemic episodes:no Polydipsia/polyuria: no Visual disturbance: no Chest pain: no Paresthesias: no Glucose Monitoring: no  Accucheck frequency: Not Checking  Fasting glucose:  Post prandial:  Evening:  Before meals: Taking Insulin?: no  Long acting insulin:  Short acting insulin: Blood Pressure Monitoring: rarely Retinal Examination: Up To Date - [facility]  Foot Exam: Up to Date Pneumovax: Up to Date Influenza: Up to Date Aspirin: no   HYPERTENSION / HYPERLIPIDEMIA Taking Benazepril, Atenolol, + Crestor.   Satisfied with current treatment? yes Duration of hypertension: chronic BP monitoring frequency: not checking BP range:  BP medication side effects: no Duration of hyperlipidemia: chronic Cholesterol medication side effects: no Cholesterol supplements: none Medication compliance:  good compliance Aspirin: no Recent stressors: no Recurrent headaches: no Visual changes: no Palpitations: no Dyspnea: no Chest pain: no Lower extremity edema: no Dizzy/lightheaded: no   PROSTATE CANCER: Last saw urology on 01/16/23.  In 2021 was told he is cancer free.  Continues to have monitoring of PSA, every 6 months, recent November 2.3.  Denies any symptoms at this time.      03/06/2023    4:13 PM 12/04/2022    4:04 PM 09/01/2022    9:36 AM 06/02/2022    3:54 PM 11/30/2021    3:54 PM  Depression screen PHQ 2/9  Decreased Interest 0 0 0 0 0  Down, Depressed, Hopeless 0 0 0 0 0  PHQ - 2 Score 0 0 0 0 0  Altered sleeping 0 0 0 0 0  Tired, decreased energy 0 0 0 0 0  Change in appetite 0 0 0 0 0  Feeling bad or failure about yourself  0 0 0 0 0  Trouble concentrating 0 0 0 0 0  Moving slowly or fidgety/restless 0 0 0 0 0  Suicidal thoughts 0 0 0 0 0  PHQ-9 Score 0 0 0 0 0  Difficult doing work/chores Not difficult at all   Not difficult at all Not difficult at all       03/06/2023    4:13 PM 12/04/2022    4:04 PM 09/01/2022    9:37 AM 06/02/2022    3:54 PM  GAD 7 : Generalized Anxiety Score  Nervous, Anxious, on Edge  0 0 0 0  Control/stop worrying 0 0 0 0  Worry too much - different things 0 0 0 0  Trouble relaxing 0 0 0 0  Restless 0 0 0 0  Easily annoyed or irritable 0 0 0 0  Afraid - awful might happen 0 0 0 0  Total GAD 7 Score 0 0 0 0  Anxiety Difficulty Not difficult at all      Relevant past medical, surgical, family and social history reviewed and updated as indicated. Interim medical history since our last visit reviewed. Allergies and medications reviewed and updated.  Review of Systems  Constitutional:  Positive for fatigue (at end of school day) and unexpected weight change. Negative for activity change, appetite change, diaphoresis and fever.  Respiratory:  Negative for cough, chest tightness, shortness of breath and wheezing.   Cardiovascular:   Negative for chest pain, palpitations and leg swelling.  Gastrointestinal:  Negative for abdominal distention, abdominal pain, constipation, diarrhea, nausea and vomiting.  Endocrine: Negative for cold intolerance, heat intolerance, polydipsia, polyphagia and polyuria.  Neurological: Negative.   Psychiatric/Behavioral: Negative.     Per HPI unless specifically indicated above     Objective:    BP (!) 100/58 (BP Location: Left Arm, Patient Position: Sitting)   Pulse 69   Temp 98 F (36.7 C) (Oral)   Ht 6' (1.829 m)   Wt 172 lb 12.8 oz (78.4 kg)   SpO2 96%   BMI 23.44 kg/m   Wt Readings from Last 3 Encounters:  06/05/23 172 lb 12.8 oz (78.4 kg)  03/06/23 177 lb (80.3 kg)  01/16/23 178 lb 4 oz (80.9 kg)    Physical Exam Vitals and nursing note reviewed.  Constitutional:      General: He is awake. He is not in acute distress.    Appearance: He is well-developed and well-groomed. He is not ill-appearing or toxic-appearing.  HENT:     Head: Normocephalic.     Right Ear: Hearing and external ear normal.     Left Ear: Hearing and external ear normal.  Eyes:     General: Lids are normal.     Extraocular Movements: Extraocular movements intact.     Conjunctiva/sclera: Conjunctivae normal.  Neck:     Thyroid: No thyromegaly.     Vascular: No carotid bruit.  Cardiovascular:     Rate and Rhythm: Normal rate and regular rhythm.     Heart sounds: Normal heart sounds. No murmur heard.    No gallop.     Comments: Edema improved on exam today. Pulmonary:     Effort: No accessory muscle usage or respiratory distress.     Breath sounds: Normal breath sounds.  Abdominal:     General: Bowel sounds are normal. There is no distension.     Palpations: Abdomen is soft.     Tenderness: There is no abdominal tenderness.  Musculoskeletal:     Cervical back: Full passive range of motion without pain.     Right lower leg: Edema (trace) present.     Left lower leg: Edema (trace) present.   Lymphadenopathy:     Cervical: No cervical adenopathy.  Skin:    General: Skin is warm.     Capillary Refill: Capillary refill takes less than 2 seconds.     Comments: A few small red bite marks to left forearm with mild erythema.  Similar to lower left neck.  Neurological:     Mental Status: He is alert and oriented to person,  place, and time.     Deep Tendon Reflexes: Reflexes are normal and symmetric.     Reflex Scores:      Brachioradialis reflexes are 2+ on the right side and 2+ on the left side.      Patellar reflexes are 2+ on the right side and 2+ on the left side. Psychiatric:        Attention and Perception: Attention normal.        Mood and Affect: Mood normal.        Speech: Speech normal.        Behavior: Behavior normal. Behavior is cooperative.        Thought Content: Thought content normal.    Results for orders placed or performed in visit on 03/06/23  Bayer DCA Hb A1c Waived   Collection Time: 03/06/23  4:14 PM  Result Value Ref Range   HB A1C (BAYER DCA - WAIVED) 6.6 (H) 4.8 - 5.6 %  Microalbumin, Urine Waived   Collection Time: 03/06/23  4:14 PM  Result Value Ref Range   Microalb, Ur Waived 30 (H) 0 - 19 mg/L   Creatinine, Urine Waived 100 10 - 300 mg/dL   Microalb/Creat Ratio <30 <30 mg/g  Comprehensive metabolic panel   Collection Time: 03/06/23  4:14 PM  Result Value Ref Range   Glucose 100 (H) 70 - 99 mg/dL   BUN 19 8 - 27 mg/dL   Creatinine, Ser 1.61 (L) 0.76 - 1.27 mg/dL   eGFR 93 >09 UE/AVW/0.98   BUN/Creatinine Ratio 25 (H) 10 - 24   Sodium 140 134 - 144 mmol/L   Potassium 4.1 3.5 - 5.2 mmol/L   Chloride 102 96 - 106 mmol/L   CO2 25 20 - 29 mmol/L   Calcium 9.7 8.6 - 10.2 mg/dL   Total Protein 6.5 6.0 - 8.5 g/dL   Albumin 4.5 3.8 - 4.8 g/dL   Globulin, Total 2.0 1.5 - 4.5 g/dL   Bilirubin Total 0.5 0.0 - 1.2 mg/dL   Alkaline Phosphatase 59 44 - 121 IU/L   AST 16 0 - 40 IU/L   ALT 14 0 - 44 IU/L  Lipid Panel w/o Chol/HDL Ratio    Collection Time: 03/06/23  4:14 PM  Result Value Ref Range   Cholesterol, Total 166 100 - 199 mg/dL   Triglycerides 119 0 - 149 mg/dL   HDL 49 >14 mg/dL   VLDL Cholesterol Cal 25 5 - 40 mg/dL   LDL Chol Calc (NIH) 92 0 - 99 mg/dL      Assessment & Plan:   Problem List Items Addressed This Visit       Cardiovascular and Mediastinum   Hypertension associated with diabetes (HCC)   Chronic, stable.  BP on lower side today, suspect related to Jardiance - is having less edema.  Urine ALB 22 March 2023.  Continue current medication regimen at this time, Benazepril offering kidney protection + Hydrochlorothiazide 12.5 MG daily which has helped lower BP.  Cannot take Amlodipine, although this would offer benefit.  Recommend checking BP a few mornings a week at home and documenting + focus on DASH diet.  Monitor for worsening edema and may provide PRN Lasix if presents, suspect some underlying PVD based on exam.  Recommend avoid Ibuprofen products and high salt intake.  Return in 3 months.  Labs today: CMP.  Will trial off Jardiance and monitor BP closely.      Relevant Orders   Bayer DCA Hb A1c Waived  Comprehensive metabolic panel with GFR   PVD (peripheral vascular disease) (HCC)   Suspect some underlying PVD based on exam with diminished lower extremity hair pattern, edema at ankles, and dry skin.  At this time monitor for wounds and continue frequent foot exams.  To notify provider if any wounds present or pain.        Endocrine   Diabetes mellitus treated with oral medication (HCC)   Refer to diabetes with neuropathy plan of care.      Relevant Orders   Bayer DCA Hb A1c Waived   Hyperlipidemia associated with type 2 diabetes mellitus (HCC)   Chronic, ongoing.  Myalgia presenting with daily Atorvastatin in past.  Continue Rosuvastatin 10 MG three days a week, educated him on this.  If myalgia with 3 days a week dosing then will trial one day a week, if ongoing myalgia then stop and  consider Zetia or injectable.      Relevant Orders   Bayer DCA Hb A1c Waived   Comprehensive metabolic panel with GFR   Lipid Panel w/o Chol/HDL Ratio   Type 2 diabetes mellitus with diabetic neuropathy (HCC) - Primary   Chronic, ongoing with A1c 6% today, trend down from 6.6%.  Urine ALB 30 in January 2025.  Continue Metformin TID and will trial back off Jardiance as he is concerned about weight loss.  Recommend he check BS at least three mornings a week and document.  If elevation above goal A1c next visit then may need to restart Jardiance.  Continue collaboration with neurology, recent notes reviewed.  Return in 3 months. - ACE and statin on board - Eye and foot exams up to date - Vaccinations up to date      Relevant Orders   Bayer DCA Hb A1c Waived   Comprehensive metabolic panel with GFR     Nervous and Auditory   Sensorimotor neuropathy   Diagnosed by neurology, continue collaboration with them.  Recent notes reviewed.  Continue current medication and supplement regimen.          Other   History of prostate cancer   Stable.  Recent visit with Dr. Ace Holder and is cancer free.  Continue collaboration with urology and PSA with them.      Other Visit Diagnoses       Bug bite, initial encounter       Recommend Claritin 10 MG daily at home for 5-7 days and may apply steroid cream to areas.        Follow up plan: Return in about 3 months (around 09/04/2023) for T2DM, HTN/HLD.

## 2023-06-06 ENCOUNTER — Encounter: Payer: Self-pay | Admitting: Nurse Practitioner

## 2023-06-06 ENCOUNTER — Ambulatory Visit: Payer: Self-pay | Admitting: Nurse Practitioner

## 2023-06-06 LAB — COMPREHENSIVE METABOLIC PANEL WITH GFR
ALT: 12 IU/L (ref 0–44)
AST: 22 IU/L (ref 0–40)
Albumin: 4.4 g/dL (ref 3.8–4.8)
Alkaline Phosphatase: 59 IU/L (ref 44–121)
BUN/Creatinine Ratio: 43 — ABNORMAL HIGH (ref 10–24)
BUN: 32 mg/dL — ABNORMAL HIGH (ref 8–27)
Bilirubin Total: 0.5 mg/dL (ref 0.0–1.2)
CO2: 19 mmol/L — ABNORMAL LOW (ref 20–29)
Calcium: 9.7 mg/dL (ref 8.6–10.2)
Chloride: 104 mmol/L (ref 96–106)
Creatinine, Ser: 0.74 mg/dL — ABNORMAL LOW (ref 0.76–1.27)
Globulin, Total: 2 g/dL (ref 1.5–4.5)
Glucose: 120 mg/dL — ABNORMAL HIGH (ref 70–99)
Potassium: 4 mmol/L (ref 3.5–5.2)
Sodium: 142 mmol/L (ref 134–144)
Total Protein: 6.4 g/dL (ref 6.0–8.5)
eGFR: 93 mL/min/{1.73_m2} (ref >59)

## 2023-06-06 LAB — LIPID PANEL W/O CHOL/HDL RATIO
Cholesterol, Total: 156 mg/dL (ref 100–199)
HDL: 48 mg/dL (ref >39)
LDL Chol Calc (NIH): 67 mg/dL (ref 0–99)
Triglycerides: 256 mg/dL — ABNORMAL HIGH (ref 0–149)
VLDL Cholesterol Cal: 41 mg/dL — ABNORMAL HIGH (ref 5–40)

## 2023-06-06 LAB — BAYER DCA HB A1C WAIVED: HB A1C (BAYER DCA - WAIVED): 6 % — ABNORMAL HIGH (ref 4.8–5.6)

## 2023-06-06 NOTE — Progress Notes (Signed)
 Contacted via MyChart   Good afternoon Tyler Morgan, your labs have returned and overall are stable.  No changes needed.  Any questions? Keep being amazing!!  Thank you for allowing me to participate in your care.  I appreciate you. Kindest regards, Deveion Denz

## 2023-06-12 ENCOUNTER — Ambulatory Visit: Attending: Neurology

## 2023-06-12 DIAGNOSIS — Z9181 History of falling: Secondary | ICD-10-CM | POA: Diagnosis not present

## 2023-06-12 DIAGNOSIS — R262 Difficulty in walking, not elsewhere classified: Secondary | ICD-10-CM | POA: Diagnosis not present

## 2023-06-12 DIAGNOSIS — R2681 Unsteadiness on feet: Secondary | ICD-10-CM

## 2023-06-12 DIAGNOSIS — M6281 Muscle weakness (generalized): Secondary | ICD-10-CM | POA: Diagnosis not present

## 2023-06-12 NOTE — Therapy (Signed)
 OUTPATIENT PHYSICAL THERAPY  EVALUATION   Patient Name: Tyler Morgan MRN: [id number] DOB:[birth year], 78 y.o., male Today's Date: 06/12/2023  END OF SESSION:  PT End of Session - 06/12/23 0823     Visit Number 1    Number of Visits 17    Date for PT Re-Evaluation 08/10/23    PT Start Time 0823    PT Stop Time 0901    PT Time Calculation (min) 38 min    Equipment Utilized During Treatment Gait belt    Activity Tolerance Patient tolerated treatment well    Behavior During Therapy WFL for tasks assessed/performed             Past Medical History:  Diagnosis Date   Diabetes mellitus without complication (HCC)    ED (erectile dysfunction)    Hypertension    Motion sickness    boats   Neuropathy    Prostate enlargement    Urinary retention    Past Surgical History:  Procedure Laterality Date   BASAL CELL CARCINOMA EXCISION     nose    CATARACT EXTRACTION Bilateral 1986, 2014   COLONOSCOPY WITH PROPOFOL  N/A 03/27/2018   Procedure: COLONOSCOPY WITH BIOPSIES;  Surgeon: Selena Daily, MD;  Location: [facility];  Service: Endoscopy;  Laterality: N/A;  Diabetic - oral meds   EYE SURGERY Bilateral [phone]   FRACTURE SURGERY Right    as a child/ right arm   HERNIA REPAIR Right 01-13-14   inguinal hernia   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 N/A 02/07/2017   Procedure: 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;  Surgeon: Dustin Gimenez, MD;  Location: [facility];  Service: Urology;  Laterality: N/A;   POLYPECTOMY N/A 03/27/2018   Procedure: POLYPECTOMY;  Surgeon: Selena Daily, MD;  Location: [facility];  Service: Endoscopy;  Laterality: N/A;   RETINAL DETACHMENT SURGERY  Jan 2015   TONSILLECTOMY  1955   Patient Active Problem List   Diagnosis Date Noted   Diabetes mellitus treated with oral medication (HCC) 03/06/2023   Dysphagia 03/06/2023   PVD (peripheral vascular disease) (HCC) 11/30/2021   B12  deficiency 01/29/2019   History of prostate cancer 12/13/2017   Hemorrhoids 12/13/2017   Sensorimotor neuropathy 12/13/2017   Hyperlipidemia associated with type 2 diabetes mellitus (HCC) 04/02/2017   Advanced care planning/counseling discussion 10/11/2016   Type 2 diabetes mellitus with diabetic neuropathy (HCC) 12/15/2014   Hypertension associated with diabetes (HCC)     PCP: Lemar Pyles, NP   REFERRING PROVIDER: Baldemar Bond, FNP  REFERRING DIAG: R26.89 (ICD-10-CM) - Balance problem  Rationale for Evaluation and Treatment: Rehabilitation  THERAPY DIAG:  Unsteadiness on feet - Plan: PT plan of care cert/re-cert  Muscle weakness (generalized) - Plan: PT plan of care cert/re-cert  Difficulty in walking, not elsewhere classified - Plan: PT plan of care cert/re-cert  History of falling - Plan: PT plan of care cert/re-cert  ONSET DATE: 03/12/2023  (Date PT referral signed, chronic condition)  SUBJECTIVE:  SUBJECTIVE STATEMENT: See pertinent history  PERTINENT HISTORY:  Balance problem. Got AFOs B feet which helps with ankle DF and gait but limits his ankle movement. The stopping after walking with the AFO's a better but still staggers. Still can't stop and put a camera in front of his face and shoot a picture without wobbling. Also had some pain in his L hand (palmar side) and got a cortisone shot which helped his L hand feel better . The whole L side of his body seems to give him the most problem which makes Dr. Bernetta Brilliant wonder about his diabetic neuropathy diagnosis. Balance might have gotten better since November from the AFOs  From PT eval on 07/24/2022:   Balance, weakness. Can get around well without a cane. Standing still, pt loses balance. Has to hold onto things to stabilize  himself. Has labradore thats about 79 year old and can walk his dog without a leash because the dog's pulling stabilizes him. Symptoms have been going on for a while and was told to have diabetic neuropathy. Had a fall in 2015 while working on the flooring in his den at home which caused R inguinal hernia S/P mesh placement. Pt states noticing problems in his legs since 2015. Pt also could not push up on his toes (tip toe) since then. No back pain currently but has had a hx of back pain over the years. Decreased sensation B feet, almost not noticeable. Feels like he has a lot of weakness which makes it difficult to keep his balance. Has B LE swelling, comes and goes, not currently getting treated for it. No low back pain, just L arm and shoulder.   PAIN:  Are you having pain? No  PRECAUTIONS: Fall  RED FLAGS: Bowel or bladder incontinence: No and Cauda equina syndrome: No   WEIGHT BEARING RESTRICTIONS: No  FALLS:  Has patient fallen in last 6 months? Yes. Number of falls 1 (pt might have slipped on ice or a wet grass)  LIVING ENVIRONMENT: Lives with: lives with their spouse Lives in: House/apartment Stairs: Yes: External: 1 steps; none Has following equipment at home: Single point cane and Grab bars  OCCUPATION: Conservation officer, historic buildings. Planning on retiring.   PLOF: Mod I.   PATIENT GOALS: Be able to stand still without wobbling.   NEXT MD VISIT: Yes with Dr. Carlis Cherry office Jul 10, 2023  OBJECTIVE:  Note: Objective measures were completed at Evaluation unless otherwise noted.  DIAGNOSTIC FINDINGS:  MR LUMBAR SPINE WO CONTRAST  12/26/2016   Narrative & Impression  CLINICAL DATA:  Lower leg neuropathy for several years. No back pain, acute injury or prior relevant surgery.   EXAM: MRI LUMBAR SPINE WITHOUT CONTRAST   TECHNIQUE: Multiplanar, multisequence MR imaging of the lumbar spine was performed. No intravenous contrast was administered.   COMPARISON:  None.    FINDINGS: Segmentation: Conventional anatomy assumed, with the last open disc space designated L5-S1.   Alignment: There is 11 mm of anterolisthesis at L5-S1 secondary to chronic bilateral L5 pars defects. The alignment is otherwise normal.   Vertebrae: As above, chronic bilateral L5 pars defects. There is associated endplate degeneration at L5-S1. No evidence of acute fracture or worrisome osseous lesion. The visualized sacroiliac joints appear unremarkable.   Conus medullaris: Extends to the T12-L1 level and appears normal.   Paraspinal and other soft tissues: No significant paraspinal findings.   Disc levels:   No significant disc space findings from T11-12 through L2-3.   L3-4: Mild loss  of disc height with annular disc bulging and endplate osteophytes. Mild facet and ligamentous hypertrophy. There is borderline spinal stenosis and mild narrowing of the lateral recesses. The foramina are patent.   L4-5: Disc height and hydration are maintained. There is mild disc bulging, facet and ligamentous hypertrophy. No significant spinal stenosis or nerve root encroachment.   L5-S1: Bilateral L5 pars defects. There is advanced degenerative disc disease with loss of disc height, disc uncovering and endplate osteophytes. There is at least moderate foraminal narrowing bilaterally with probable encroachment on both exiting L5 nerve roots. The spinal canal and lateral recesses are widely patent.   IMPRESSION: 1. 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 There is probable chronic bilateral L5 nerve root encroachment. 2. Mild disc bulging at L3-4, contributing to mild narrowing of the lateral recesses, but no nerve root encroachment.     Electronically Signed   By: Elmon Hagedorn M.D.   On: 12/26/2016 16:46    PATIENT SURVEYS:    COGNITION: Overall cognitive status: Within  functional limits for tasks assessed     SENSATION:   MUSCLE LENGTH:   POSTURE: R shoulder lower, upper thoracic flexion, slight R lateral shift, slight R thoracolumbar convexity,  B knee flexion,   PALPATION:   LUMBAR ROM:   AROM eval  Flexion   Extension   Right lateral flexion   Left lateral flexion   Right rotation   Left rotation    (Blank rows = not tested)  LOWER EXTREMITY ROM:     Active  Right eval Left eval  Hip flexion    Hip extension    Hip abduction    Hip adduction    Hip internal rotation    Hip external rotation    Knee flexion    Knee extension    Ankle dorsiflexion    Ankle plantarflexion    Ankle inversion    Ankle eversion     (Blank rows = not tested)  LOWER EXTREMITY MMT:    MMT Right eval Left eval  Hip flexion    Hip extension    Hip abduction    Hip adduction    Hip internal rotation    Hip external rotation    Knee flexion    Knee extension    Ankle dorsiflexion    Ankle plantarflexion    Ankle inversion    Ankle eversion     (Blank rows = not tested)  LUMBAR SPECIAL TESTS:    FUNCTIONAL TESTS:  DGI without AD: 12/24 5 times Sit <> stand: 31 seconds with B UE assist     B LE weakness felt.     TUG without AD: 13.81 seconds, 11.15 seconds, 10. 25 seconds        GAIT: Distance walked: 60 ft Assistive device utilized: None Level of assistance: SBA Comments: Decreased stance L LE, B pelvic drop  TREATMENT DATE: 06/12/2023  PATIENT EDUCATION:  Education details: POC Person educated: Patient Education method: Explanation Education comprehension: verbalized understanding  HOME EXERCISE PROGRAM:   ASSESSMENT:  CLINICAL IMPRESSION: Patient is a 78 y.o. male who was seen today for physical therapy evaluation and treatment for balance problem. He also demonstrates altered posture  and gait pattern, B LE functional strength weakness, difficulty maintaining static standing balance, low DGI score suggesting increased fall risk, and difficulty performing standing tasks secondary to his B LE weakness, and decreased balance. Pt will benefit from skilled physical therapy services to address the aforementioned deficits.   OBJECTIVE IMPAIRMENTS: Abnormal gait, difficulty walking, improper body mechanics, and postural dysfunction.   ACTIVITY LIMITATIONS: carrying, lifting, standing, squatting, stairs, transfers, and locomotion level  PARTICIPATION LIMITATIONS:   PERSONAL FACTORS: Age, Fitness, Past/current experiences, Profession, Time since onset of injury/illness/exacerbation, and 1-2 comorbidities: DM, neuropathy  are also affecting patient's functional outcome.   REHAB POTENTIAL: Fair    CLINICAL DECISION MAKING: Stable/uncomplicated, (balance has improved with B AFO per pt)  EVALUATION COMPLEXITY: Low   GOALS: Goals reviewed with patient? Yes  SHORT TERM GOALS: Target date: 06/22/2023  Pt will be independent with his initial HEP to improve strength, balance, function.  Baseline: Pt has not yet started his initial HEP (06/12/2023) Goal status: INITIAL   LONG TERM GOALS: Target date: 08/10/2023  Pt will improve his 5 times Sit <> stand time from chair with arms to 12 seconds or less without UE assist as a demonstration of improved functional LE strength.  Baseline: 31 seconds with B UE assist (06/12/2023) Goal status: INITIAL  2.  Pt will improve his Dynamic Gait Index Score to at least 19/24 or more as a demonstration of improved balance.  Baseline: DGI without AD: 12/24 (06/12/2023) Goal status: INITIAL  3.  Pt will improve his (Activities specific Balance Confidence) ABC scale score by at least 10% as a demonstration of improved balance confidence and decreased fall risk.   Baseline: ABC scale score to be obtained next session (06/12/2023) Goal status:  INITIAL   PLAN:  PT FREQUENCY: 1-2x/week  PT DURATION: 8 weeks  PLANNED INTERVENTIONS: 97110-Therapeutic exercises, 97530- Therapeutic activity, 97112- Neuromuscular re-education, 97535- Self Care, [id number]- Manual therapy, [phone]- Gait training, [phone]- Canalith repositioning, [id number]- Electrical stimulation (unattended), Patient/Family education, Balance training, and Stair training.  PLAN FOR NEXT SESSION: trunk and glute strengthening, balance training, manual techniques, modalities PRN   Paytyn Mesta, PT, DPT 06/12/2023, 11:55 AM

## 2023-06-14 ENCOUNTER — Ambulatory Visit

## 2023-06-14 DIAGNOSIS — M6281 Muscle weakness (generalized): Secondary | ICD-10-CM | POA: Diagnosis not present

## 2023-06-14 DIAGNOSIS — R262 Difficulty in walking, not elsewhere classified: Secondary | ICD-10-CM

## 2023-06-14 DIAGNOSIS — Z9181 History of falling: Secondary | ICD-10-CM | POA: Diagnosis not present

## 2023-06-14 DIAGNOSIS — R2681 Unsteadiness on feet: Secondary | ICD-10-CM

## 2023-06-14 NOTE — Therapy (Signed)
 OUTPATIENT PHYSICAL THERAPY TREATMENT   Patient Name: Tyler Morgan MRN: [id number] DOB:06-Feb-1946, 78 y.o., male Today's Date: 06/14/2023  END OF SESSION:  PT End of Session - 06/14/23 0821     Visit Number 2    Number of Visits 17    Date for PT Re-Evaluation 08/10/23    PT Start Time 0821    PT Stop Time 0905    PT Time Calculation (min) 44 min    Equipment Utilized During Treatment --    Activity Tolerance Patient tolerated treatment well    Behavior During Therapy [facility] for tasks assessed/performed              Past Medical History:  Diagnosis Date   Diabetes mellitus without complication (HCC)    ED (erectile dysfunction)    Hypertension    Motion sickness    boats   Neuropathy    Prostate enlargement    Urinary retention    Past Surgical History:  Procedure Laterality Date   BASAL CELL CARCINOMA EXCISION     nose    CATARACT EXTRACTION Bilateral 1986, 2014   COLONOSCOPY WITH PROPOFOL  N/A 03/27/2018   Procedure: COLONOSCOPY WITH BIOPSIES;  Surgeon: Selena Daily, MD;  Location: [facility];  Service: Endoscopy;  Laterality: N/A;  Diabetic - oral meds   EYE SURGERY Bilateral [phone]   FRACTURE SURGERY Right    as a child/ right arm   HERNIA REPAIR Right 01-13-14   inguinal hernia   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 N/A 02/07/2017   Procedure: 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;  Surgeon: Dustin Gimenez, MD;  Location: [facility];  Service: Urology;  Laterality: N/A;   POLYPECTOMY N/A 03/27/2018   Procedure: POLYPECTOMY;  Surgeon: Selena Daily, MD;  Location: [facility];  Service: Endoscopy;  Laterality: N/A;   RETINAL DETACHMENT SURGERY  Jan 2015   TONSILLECTOMY  1955   Patient Active Problem List   Diagnosis Date Noted   Diabetes mellitus treated with oral medication (HCC) 03/06/2023   Dysphagia 03/06/2023   PVD (peripheral vascular disease) (HCC) 11/30/2021   B12  deficiency 01/29/2019   History of prostate cancer 12/13/2017   Hemorrhoids 12/13/2017   Sensorimotor neuropathy 12/13/2017   Hyperlipidemia associated with type 2 diabetes mellitus (HCC) 04/02/2017   Advanced care planning/counseling discussion 10/11/2016   Type 2 diabetes mellitus with diabetic neuropathy (HCC) 12/15/2014   Hypertension associated with diabetes (HCC)     PCP: Lemar Pyles, NP   REFERRING PROVIDER: Baldemar Bond, FNP  REFERRING DIAG: R26.89 (ICD-10-CM) - Balance problem  Rationale for Evaluation and Treatment: Rehabilitation  THERAPY DIAG:  Unsteadiness on feet  Muscle weakness (generalized)  Difficulty in walking, not elsewhere classified  History of falling  ONSET DATE: 03/12/2023  (Date PT referral signed, chronic condition)  SUBJECTIVE:  SUBJECTIVE STATEMENT: See pertinent history  PERTINENT HISTORY:  Balance problem. Got AFOs B feet which helps with ankle DF and gait but limits his ankle movement. The stopping after walking with the AFO's a better but still staggers. Still can't stop and put a camera in front of his face and shoot a picture without wobbling. Also had some pain in his L hand (palmar side) and got a cortisone shot which helped his L hand feel better . The whole L side of his body seems to give him the most problem which makes Dr. Bernetta Brilliant wonder about his diabetic neuropathy diagnosis. Balance might have gotten better since November from the AFOs  Blood pressure is controlled.  No latex allergies  From PT eval on 07/24/2022:   Balance, weakness. Can get around well without a cane. Standing still, pt loses balance. Has to hold onto things to stabilize himself. Has labradore thats about 78 year old and can walk his dog without a leash because the  dog's pulling stabilizes him. Symptoms have been going on for a while and was told to have diabetic neuropathy. Had a fall in 2015 while working on the flooring in his den at home which caused R inguinal hernia S/P mesh placement. Pt states noticing problems in his legs since 2015. Pt also could not push up on his toes (tip toe) since then. No back pain currently but has had a hx of back pain over the years. Decreased sensation B feet, almost not noticeable. Feels like he has a lot of weakness which makes it difficult to keep his balance. Has B LE swelling, comes and goes, not currently getting treated for it. No low back pain, just L arm and shoulder.   PAIN:  Are you having pain? No  PRECAUTIONS: Fall  RED FLAGS: Bowel or bladder incontinence: No and Cauda equina syndrome: No   WEIGHT BEARING RESTRICTIONS: No  FALLS:  Has patient fallen in last 6 months? Yes. Number of falls 1 (pt might have slipped on ice or a wet grass)  LIVING ENVIRONMENT: Lives with: lives with their spouse Lives in: House/apartment Stairs: Yes: External: 1 steps; none Has following equipment at home: Single point cane and Grab bars  OCCUPATION: Conservation officer, historic buildings. Planning on retiring.   PLOF: Mod I.   PATIENT GOALS: Be able to stand still without wobbling.   NEXT MD VISIT: Yes with Dr. Carlis Cherry office Jul 10, 2023  OBJECTIVE:  Note: Objective measures were completed at Evaluation unless otherwise noted.  DIAGNOSTIC FINDINGS:  MR LUMBAR SPINE WO CONTRAST  12/26/2016   Narrative & Impression  CLINICAL DATA:  Lower leg neuropathy for several years. No back pain, acute injury or prior relevant surgery.   EXAM: MRI LUMBAR SPINE WITHOUT CONTRAST   TECHNIQUE: Multiplanar, multisequence MR imaging of the lumbar spine was performed. No intravenous contrast was administered.   COMPARISON:  None.   FINDINGS: Segmentation: Conventional anatomy assumed, with the last open disc space designated L5-S1.    Alignment: There is 11 mm of anterolisthesis at L5-S1 secondary to chronic bilateral L5 pars defects. The alignment is otherwise normal.   Vertebrae: As above, chronic bilateral L5 pars defects. There is associated endplate degeneration at L5-S1. No evidence of acute fracture or worrisome osseous lesion. The visualized sacroiliac joints appear unremarkable.   Conus medullaris: Extends to the T12-L1 level and appears normal.   Paraspinal and other soft tissues: No significant paraspinal findings.   Disc levels:   No significant disc space findings  from T11-12 through L2-3.   L3-4: Mild loss of disc height with annular disc bulging and endplate osteophytes. Mild facet and ligamentous hypertrophy. There is borderline spinal stenosis and mild narrowing of the lateral recesses. The foramina are patent.   L4-5: Disc height and hydration are maintained. There is mild disc bulging, facet and ligamentous hypertrophy. No significant spinal stenosis or nerve root encroachment.   L5-S1: Bilateral L5 pars defects. There is advanced degenerative disc disease with loss of disc height, disc uncovering and endplate osteophytes. There is at least moderate foraminal narrowing bilaterally with probable encroachment on both exiting L5 nerve roots. The spinal canal and lateral recesses are widely patent.   IMPRESSION: 1. 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 There is probable chronic bilateral L5 nerve root encroachment. 2. Mild disc bulging at L3-4, contributing to mild narrowing of the lateral recesses, but no nerve root encroachment.     Electronically Signed   By: Elmon Hagedorn M.D.   On: 12/26/2016 16:46    PATIENT SURVEYS:    COGNITION: Overall cognitive status: Within functional limits for tasks assessed     SENSATION:   MUSCLE LENGTH:   POSTURE: R shoulder lower, upper  thoracic flexion, slight R lateral shift, slight R thoracolumbar convexity,  B knee flexion,   PALPATION:   LUMBAR ROM:   AROM eval  Flexion   Extension   Right lateral flexion   Left lateral flexion   Right rotation   Left rotation    (Blank rows = not tested)  LOWER EXTREMITY ROM:     Active  Right eval Left eval  Hip flexion    Hip extension    Hip abduction    Hip adduction    Hip internal rotation    Hip external rotation    Knee flexion    Knee extension    Ankle dorsiflexion    Ankle plantarflexion    Ankle inversion    Ankle eversion     (Blank rows = not tested)  LOWER EXTREMITY MMT:    MMT Right eval Left eval  Hip flexion    Hip extension    Hip abduction    Hip adduction    Hip internal rotation    Hip external rotation    Knee flexion    Knee extension    Ankle dorsiflexion    Ankle plantarflexion    Ankle inversion    Ankle eversion     (Blank rows = not tested)  LUMBAR SPECIAL TESTS:    FUNCTIONAL TESTS:  DGI without AD: 12/24 5 times Sit <> stand: 31 seconds with B UE assist     B LE weakness felt.     TUG without AD: 13.81 seconds, 11.15 seconds, 10. 25 seconds        GAIT: Distance walked: 60 ft Assistive device utilized: None Level of assistance: SBA Comments: Decreased stance L LE, B pelvic drop  TREATMENT DATE: 06/14/2023  Neuromuscular control  Hooklying   Posterior pelvic tilt 10x10 seconds 2 sets     Then with march 10x4     Folded towel behind R pelvis for assist. Demonstrates ipsilateral pelvic rotation during hip flexion R > L   Leg press resisting blue band     R 10x3   L 10x3   Clamshell with posterior pelvic tilt blue band 10x3   Hip adduction folded pillow squeeze isometrics 10x3 with 5 second holds   Lower trunk rotation isometrics with PT manual resistance 10x5 seconds to  the L for 3 sets   To promote R lower trunk rotation strength to decrease R lower trunk rotation posture.     Improved exercise technique, movement at target joints, use of target muscles after mod verbal, visual, tactile cues.       PATIENT EDUCATION:  Education details: there-ex, HEP Person educated: Patient Education method: Explanation Education comprehension: verbalized understanding  HOME EXERCISE PROGRAM: Access Code: [email or web address] URL: [email or web address] Date: 06/14/2023 Prepared by: Suzzane Estes  Exercises - Supine Posterior Pelvic Tilt  - 1 x daily - 7 x weekly - 3 sets - 10 reps - 10 seconds hold - Supine March  - 1 x daily - 7 x weekly - 3 sets - 10 reps - Supine Knee and Hip Extension with Resistance  - 1 x daily - 7 x weekly - 3 sets - 10 reps  Blue band    ASSESSMENT:  CLINICAL IMPRESSION:  Worked on improving core and glute strength, lumbopelvic control as well as neutral low back posture to promote better low back nerve communication to B LE to promote strength and balance. Good muscle use observed with exercises. Weak core strength and lumbopelvic control observed. Pt will benefit from continued skilled physical therapy services to improve strength, balance, and function.   OBJECTIVE IMPAIRMENTS: Abnormal gait, difficulty walking, improper body mechanics, and postural dysfunction.   ACTIVITY LIMITATIONS: carrying, lifting, standing, squatting, stairs, transfers, and locomotion level  PARTICIPATION LIMITATIONS:   PERSONAL FACTORS: Age, Fitness, Past/current experiences, Profession, Time since onset of injury/illness/exacerbation, and 1-2 comorbidities: DM, neuropathy  are also affecting patient's functional outcome.   REHAB POTENTIAL: Fair    CLINICAL DECISION MAKING: Stable/uncomplicated, (balance has improved with B AFO per pt)  EVALUATION COMPLEXITY: Low   GOALS: Goals reviewed with patient? Yes  SHORT TERM GOALS: Target date:  06/22/2023  Pt will be independent with his initial HEP to improve strength, balance, function.  Baseline: Pt has not yet started his initial HEP (06/12/2023) Goal status: INITIAL   LONG TERM GOALS: Target date: 08/10/2023  Pt will improve his 5 times Sit <> stand time from chair with arms to 12 seconds or less without UE assist as a demonstration of improved functional LE strength.  Baseline: 31 seconds with B UE assist (06/12/2023) Goal status: INITIAL  2.  Pt will improve his Dynamic Gait Index Score to at least 19/24 or more as a demonstration of improved balance.  Baseline: DGI without AD: 12/24 (06/12/2023) Goal status: INITIAL  3.  Pt will improve his (Activities specific Balance Confidence) ABC scale score by at least 10% as a demonstration of improved balance confidence and decreased fall risk.   Baseline: ABC scale score to be obtained next session (06/12/2023) Goal status: INITIAL   PLAN:  PT FREQUENCY: 1-2x/week  PT DURATION: 8 weeks  PLANNED INTERVENTIONS: 97110-Therapeutic exercises, 97530- Therapeutic activity, [id number]- Neuromuscular re-education, 97535- Self Care, [id number]- Manual therapy, [id number]- Gait training, [phone]- Canalith repositioning,  [id number]- Electrical stimulation (unattended), Patient/Family education, Balance training, and Stair training.  PLAN FOR NEXT SESSION: trunk and glute strengthening, balance training, manual techniques, modalities PRN   Cheikh Bramble, PT, DPT 06/14/2023, 9:14 AM

## 2023-06-18 ENCOUNTER — Ambulatory Visit

## 2023-06-18 DIAGNOSIS — Z9181 History of falling: Secondary | ICD-10-CM | POA: Diagnosis not present

## 2023-06-18 DIAGNOSIS — R262 Difficulty in walking, not elsewhere classified: Secondary | ICD-10-CM | POA: Diagnosis not present

## 2023-06-18 DIAGNOSIS — R2681 Unsteadiness on feet: Secondary | ICD-10-CM

## 2023-06-18 DIAGNOSIS — M6281 Muscle weakness (generalized): Secondary | ICD-10-CM | POA: Diagnosis not present

## 2023-06-18 NOTE — Therapy (Signed)
 OUTPATIENT PHYSICAL THERAPY TREATMENT   Patient Name: Tyler Morgan MRN: [id number] DOB:1945-12-28, 78 y.o., male Today's Date: 06/18/2023  END OF SESSION:  PT End of Session - 06/18/23 0821     Visit Number 3    Number of Visits 17    Date for PT Re-Evaluation 08/10/23    PT Start Time 0821    PT Stop Time 0903    PT Time Calculation (min) 42 min    Activity Tolerance Patient tolerated treatment well    Behavior During Therapy [facility] for tasks assessed/performed               Past Medical History:  Diagnosis Date   Diabetes mellitus without complication [facility])    ED (erectile dysfunction)    Hypertension    Motion sickness    boats   Neuropathy    Prostate enlargement    Urinary retention    Past Surgical History:  Procedure Laterality Date   BASAL CELL CARCINOMA EXCISION     nose    CATARACT EXTRACTION Bilateral 1986, 2014   COLONOSCOPY WITH PROPOFOL  N/A 03/27/2018   Procedure: COLONOSCOPY WITH BIOPSIES;  Surgeon: Selena Daily, MD;  Location: [facility];  Service: Endoscopy;  Laterality: N/A;  Diabetic - oral meds   EYE SURGERY Bilateral [phone]   FRACTURE SURGERY Right    as a child/ right arm   HERNIA REPAIR Right 01-13-14   inguinal hernia   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 N/A 02/07/2017   Procedure: 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;  Surgeon: Dustin Gimenez, MD;  Location: [facility] ORS;  Service: Urology;  Laterality: N/A;   POLYPECTOMY N/A 03/27/2018   Procedure: POLYPECTOMY;  Surgeon: Selena Daily, MD;  Location: [facility];  Service: Endoscopy;  Laterality: N/A;   RETINAL DETACHMENT SURGERY  Jan 2015   TONSILLECTOMY  1955   Patient Active Problem List   Diagnosis Date Noted   Diabetes mellitus treated with oral medication (HCC) 03/06/2023   Dysphagia 03/06/2023   PVD (peripheral vascular disease) (HCC) 11/30/2021   B12 deficiency 01/29/2019   History of prostate  cancer 12/13/2017   Hemorrhoids 12/13/2017   Sensorimotor neuropathy 12/13/2017   Hyperlipidemia associated with type 2 diabetes mellitus (HCC) 04/02/2017   Advanced care planning/counseling discussion 10/11/2016   Type 2 diabetes mellitus with diabetic neuropathy (HCC) 12/15/2014   Hypertension associated with diabetes (HCC)     PCP: Lemar Pyles, NP   REFERRING PROVIDER: Baldemar Bond, FNP  REFERRING DIAG: R26.89 (ICD-10-CM) - Balance problem  Rationale for Evaluation and Treatment: Rehabilitation  THERAPY DIAG:  Unsteadiness on feet  Muscle weakness (generalized)  Difficulty in walking, not elsewhere classified  ONSET DATE: 03/12/2023  (Date PT referral signed, chronic condition)  SUBJECTIVE:  SUBJECTIVE STATEMENT: Did not do his exercises in the weekend. Was coughing more. Felt better Saturday, mowed the yard but was not able to do his exercises afterwasrds. Used to push mower and picked up sticks.    PERTINENT HISTORY:  Balance problem. Got AFOs B feet which helps with ankle DF and gait but limits his ankle movement. The stopping after walking with the AFO's a better but still staggers. Still can't stop and put a camera in front of his face and shoot a picture without wobbling. Also had some pain in his L hand (palmar side) and got a cortisone shot which helped his L hand feel better . The whole L side of his body seems to give him the most problem which makes Dr. Bernetta Brilliant wonder about his diabetic neuropathy diagnosis. Balance might have gotten better since November from the AFOs  Blood pressure is controlled.  No latex allergies  From PT eval on 07/24/2022:   Balance, weakness. Can get around well without a cane. Standing still, pt loses balance. Has to hold onto things to  stabilize himself. Has labradore thats about 78 year old and can walk his dog without a leash because the dog's pulling stabilizes him. Symptoms have been going on for a while and was told to have diabetic neuropathy. Had a fall in 2015 while working on the flooring in his den at home which caused R inguinal hernia S/P mesh placement. Pt states noticing problems in his legs since 2015. Pt also could not push up on his toes (tip toe) since then. No back pain currently but has had a hx of back pain over the years. Decreased sensation B feet, almost not noticeable. Feels like he has a lot of weakness which makes it difficult to keep his balance. Has B LE swelling, comes and goes, not currently getting treated for it. No low back pain, just L arm and shoulder.   PAIN:  Are you having pain? No  PRECAUTIONS: Fall  RED FLAGS: Bowel or bladder incontinence: No and Cauda equina syndrome: No   WEIGHT BEARING RESTRICTIONS: No  FALLS:  Has patient fallen in last 6 months? Yes. Number of falls 1 (pt might have slipped on ice or a wet grass)  LIVING ENVIRONMENT: Lives with: lives with their spouse Lives in: House/apartment Stairs: Yes: External: 1 steps; none Has following equipment at home: Single point cane and Grab bars  OCCUPATION: Conservation officer, historic buildings. Planning on retiring.   PLOF: Mod I.   PATIENT GOALS: Be able to stand still without wobbling.   NEXT MD VISIT: Yes with Dr. Carlis Cherry office Jul 10, 2023  OBJECTIVE:  Note: Objective measures were completed at Evaluation unless otherwise noted.  DIAGNOSTIC FINDINGS:  MR LUMBAR SPINE WO CONTRAST  12/26/2016   Narrative & Impression  CLINICAL DATA:  Lower leg neuropathy for several years. No back pain, acute injury or prior relevant surgery.   EXAM: MRI LUMBAR SPINE WITHOUT CONTRAST   TECHNIQUE: Multiplanar, multisequence MR imaging of the lumbar spine was performed. No intravenous contrast was administered.   COMPARISON:  None.    FINDINGS: Segmentation: Conventional anatomy assumed, with the last open disc space designated L5-S1.   Alignment: There is 11 mm of anterolisthesis at L5-S1 secondary to chronic bilateral L5 pars defects. The alignment is otherwise normal.   Vertebrae: As above, chronic bilateral L5 pars defects. There is associated endplate degeneration at L5-S1. No evidence of acute fracture or worrisome osseous lesion. The visualized sacroiliac joints appear unremarkable.  Conus medullaris: Extends to the T12-L1 level and appears normal.   Paraspinal and other soft tissues: No significant paraspinal findings.   Disc levels:   No significant disc space findings from T11-12 through L2-3.   L3-4: Mild loss of disc height with annular disc bulging and endplate osteophytes. Mild facet and ligamentous hypertrophy. There is borderline spinal stenosis and mild narrowing of the lateral recesses. The foramina are patent.   L4-5: Disc height and hydration are maintained. There is mild disc bulging, facet and ligamentous hypertrophy. No significant spinal stenosis or nerve root encroachment.   L5-S1: Bilateral L5 pars defects. There is advanced degenerative disc disease with loss of disc height, disc uncovering and endplate osteophytes. There is at least moderate foraminal narrowing bilaterally with probable encroachment on both exiting L5 nerve roots. The spinal canal and lateral recesses are widely patent.   IMPRESSION: 1. 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 There is probable chronic bilateral L5 nerve root encroachment. 2. Mild disc bulging at L3-4, contributing to mild narrowing of the lateral recesses, but no nerve root encroachment.     Electronically Signed   By: Elmon Hagedorn M.D.   On: 12/26/2016 16:46    PATIENT SURVEYS:    COGNITION: Overall cognitive status: Within  functional limits for tasks assessed     SENSATION:   MUSCLE LENGTH:   POSTURE: R shoulder lower, upper thoracic flexion, slight R lateral shift, slight R thoracolumbar convexity,  B knee flexion,   PALPATION:   LUMBAR ROM:   AROM eval  Flexion   Extension   Right lateral flexion   Left lateral flexion   Right rotation   Left rotation    (Blank rows = not tested)  LOWER EXTREMITY ROM:     Active  Right eval Left eval  Hip flexion    Hip extension    Hip abduction    Hip adduction    Hip internal rotation    Hip external rotation    Knee flexion    Knee extension    Ankle dorsiflexion    Ankle plantarflexion    Ankle inversion    Ankle eversion     (Blank rows = not tested)  LOWER EXTREMITY MMT:    MMT Right eval Left eval  Hip flexion    Hip extension    Hip abduction    Hip adduction    Hip internal rotation    Hip external rotation    Knee flexion    Knee extension    Ankle dorsiflexion    Ankle plantarflexion    Ankle inversion    Ankle eversion     (Blank rows = not tested)  LUMBAR SPECIAL TESTS:    FUNCTIONAL TESTS:  DGI without AD: 12/24 5 times Sit <> stand: 31 seconds with B UE assist     B LE weakness felt.     TUG without AD: 13.81 seconds, 11.15 seconds, 10. 25 seconds        GAIT: Distance walked: 60 ft Assistive device utilized: None Level of assistance: SBA Comments: Decreased stance L LE, B pelvic drop  TREATMENT DATE: 06/18/2023  Neuromuscular control  Hooklying   Posterior pelvic tilt 10x5 seconds then 10x10 seconds 2 sets     Then with march 10x3     Folded towel behind R pelvis for assist. Demonstrates ipsilateral pelvic rotation during hip flexion R > L   Leg press resisting blue band     R 10x3   L 10x3   Clamshell with posterior pelvic tilt blue band 10x3 with 5 second  holds  Hip adduction small blue ball squeeze isometrics 10x3 with 5 second holds   Lower trunk rotation isometrics with PT manual resistance 10x5 seconds to the L for 3 sets   To promote strength to decrease R lower trunk rotation posture.       Improved exercise technique, movement at target joints, use of target muscles after mod verbal, visual, tactile cues.       PATIENT EDUCATION:  Education details: there-ex, HEP Person educated: Patient Education method: Explanation Education comprehension: verbalized understanding  HOME EXERCISE PROGRAM: Access Code: [email or web address] URL: [email or web address] Date: 06/14/2023 Prepared by: Suzzane Estes  Exercises - Supine Posterior Pelvic Tilt  - 1 x daily - 7 x weekly - 3 sets - 10 reps - 10 seconds hold - Supine March  - 1 x daily - 7 x weekly - 3 sets - 10 reps - Supine Knee and Hip Extension with Resistance  - 1 x daily - 7 x weekly - 3 sets - 10 reps  Blue band    ASSESSMENT:  CLINICAL IMPRESSION:  Continued working on improving core and glute strength, lumbopelvic control as well as neutral low back posture to promote better low back nerve communication to B LE to promote strength and balance secondary to core weakness observed. Pt tolerated session well without aggravation of symptoms. Pt will benefit from continued skilled physical therapy services to improve strength, balance, and function.     OBJECTIVE IMPAIRMENTS: Abnormal gait, difficulty walking, improper body mechanics, and postural dysfunction.   ACTIVITY LIMITATIONS: carrying, lifting, standing, squatting, stairs, transfers, and locomotion level  PARTICIPATION LIMITATIONS:   PERSONAL FACTORS: Age, Fitness, Past/current experiences, Profession, Time since onset of injury/illness/exacerbation, and 1-2 comorbidities: DM, neuropathy  are also affecting patient's functional outcome.   REHAB POTENTIAL: Fair    CLINICAL DECISION MAKING:  Stable/uncomplicated, (balance has improved with B AFO per pt)  EVALUATION COMPLEXITY: Low   GOALS: Goals reviewed with patient? Yes  SHORT TERM GOALS: Target date: 06/22/2023  Pt will be independent with his initial HEP to improve strength, balance, function.  Baseline: Pt has not yet started his initial HEP (06/12/2023) Goal status: INITIAL   LONG TERM GOALS: Target date: 08/10/2023  Pt will improve his 5 times Sit <> stand time from chair with arms to 12 seconds or less without UE assist as a demonstration of improved functional LE strength.  Baseline: 31 seconds with B UE assist (06/12/2023) Goal status: INITIAL  2.  Pt will improve his Dynamic Gait Index Score to at least 19/24 or more as a demonstration of improved balance.  Baseline: DGI without AD: 12/24 (06/12/2023) Goal status: INITIAL  3.  Pt will improve his (Activities specific Balance Confidence) ABC scale score by at least 10% as a demonstration of improved balance confidence and decreased fall risk.   Baseline: ABC scale score to be obtained next session (06/12/2023) Goal status: INITIAL   PLAN:  PT FREQUENCY: 1-2x/week  PT DURATION: 8 weeks  PLANNED INTERVENTIONS: 97110-Therapeutic exercises, 97530- Therapeutic activity, [id number]- Neuromuscular re-education, 97535- Self Care, [id number]-  Manual therapy, [id number]- Gait training, 62952- Canalith repositioning, [phone]- Electrical stimulation (unattended), Patient/Family education, Balance training, and Stair training.  PLAN FOR NEXT SESSION: trunk and glute strengthening, balance training, manual techniques, modalities PRN   Timarie Labell, PT, DPT 06/18/2023, 10:11 AM

## 2023-06-21 ENCOUNTER — Encounter: Payer: Self-pay | Admitting: Nurse Practitioner

## 2023-06-21 ENCOUNTER — Ambulatory Visit (INDEPENDENT_AMBULATORY_CARE_PROVIDER_SITE_OTHER): Admitting: Nurse Practitioner

## 2023-06-21 VITALS — BP 118/63 | HR 69 | Temp 98.5°F | Wt 175.0 lb

## 2023-06-21 DIAGNOSIS — J0100 Acute maxillary sinusitis, unspecified: Secondary | ICD-10-CM | POA: Insufficient documentation

## 2023-06-21 MED ORDER — AZITHROMYCIN 250 MG PO TABS: ORAL_TABLET | ORAL | 0 refills | Status: AC

## 2023-06-21 NOTE — Patient Instructions (Signed)

## 2023-06-21 NOTE — Assessment & Plan Note (Signed)
 Acute with symptoms going on 7 days.  Will start a Zpack and recommend he take Claritin 10 MG daily for 7 days.  Avoid Prednisone at this time due to diabetes.  Recommend: - Increased rest - Increasing Fluids - Salt water  gargling, chloraseptic spray and throat lozenges - Mucinex.  - Saline sinus flushes or a neti pot.  - Humidifying the air.

## 2023-06-21 NOTE — Progress Notes (Signed)
 BP 118/63   Pulse 69   Temp 98.5 F (36.9 C) (Oral)   Wt 175 lb (79.4 kg)   SpO2 95%   BMI 23.73 kg/m    Subjective:    Patient ID: Tyler Morgan, male    DOB: 1945-11-08, 78 y.o.   MRN: [id number]  HPI: Tyler Morgan is a 78 y.o. male  Chief Complaint  Patient presents with   Cough    Patient states he has been having a lingering cough for the last week. States he is not coughing up anything. States he has used Flonase that he thinks may have helped some.    UPPER RESPIRATORY TRACT INFECTION Started with illness about one week ago.  His wife had similar, saw provider last week for this.  Started with mainly a cough. Fever: yes -- very low grade initially Cough: yes Shortness of breath: no Wheezing:  occasional if lying down Chest pain: no Chest tightness: yes Chest congestion: yes Nasal congestion: no Runny nose: yes Post nasal drip: yes Sneezing: no Sore throat: a little  Swollen glands: no Sinus pressure: no Headache: no Face pain: no Toothache: no Ear pain: none Ear pressure: yes bilateral Eyes red/itching:no Eye drainage/crusting: no  Vomiting: no Rash: no Fatigue: yes Sick contacts: yes wife and a group of distant cousins of wife Strep contacts: no  Context: fluctuating Recurrent sinusitis: no Relief with OTC cold/cough medications: yes  Treatments attempted: Flonase    Relevant past medical, surgical, family and social history reviewed and updated as indicated. Interim medical history since our last visit reviewed. Allergies and medications reviewed and updated.  Review of Systems  Constitutional:  Positive for fatigue and fever. Negative for activity change, appetite change and chills.  HENT:  Positive for postnasal drip, rhinorrhea and sore throat. Negative for congestion, ear discharge, ear pain, sinus pressure, sinus pain, sneezing and voice change.   Respiratory:  Positive for cough, chest tightness and wheezing. Negative for  shortness of breath.   Cardiovascular:  Negative for chest pain, palpitations and leg swelling.  Gastrointestinal: Negative.   Neurological: Negative.   Psychiatric/Behavioral: Negative.      Per HPI unless specifically indicated above     Objective:    BP 118/63   Pulse 69   Temp 98.5 F (36.9 C) (Oral)   Wt 175 lb (79.4 kg)   SpO2 95%   BMI 23.73 kg/m   Wt Readings from Last 3 Encounters:  06/21/23 175 lb (79.4 kg)  06/05/23 172 lb 12.8 oz (78.4 kg)  03/06/23 177 lb (80.3 kg)    Physical Exam Vitals and nursing note reviewed.  Constitutional:      General: He is awake. He is not in acute distress.    Appearance: He is well-developed and well-groomed. He is not ill-appearing or toxic-appearing.  HENT:     Head: Normocephalic.     Right Ear: Hearing, ear canal and external ear normal. No tenderness. A middle ear effusion is present. Tympanic membrane is not injected or perforated.     Left Ear: Hearing, ear canal and external ear normal. No tenderness. A middle ear effusion is present. Tympanic membrane is not injected or perforated.     Nose: Rhinorrhea present. Rhinorrhea is clear.     Right Sinus: No maxillary sinus tenderness or frontal sinus tenderness.     Left Sinus: No maxillary sinus tenderness or frontal sinus tenderness.     Mouth/Throat:     Mouth: Mucous membranes are moist.  Pharynx: Posterior oropharyngeal erythema present. No pharyngeal swelling or oropharyngeal exudate.  Eyes:     General: Lids are normal.     Extraocular Movements: Extraocular movements intact.     Conjunctiva/sclera: Conjunctivae normal.  Neck:     Thyroid : No thyromegaly.     Vascular: No carotid bruit.  Cardiovascular:     Rate and Rhythm: Normal rate and regular rhythm.     Heart sounds: Normal heart sounds.  Pulmonary:     Effort: Pulmonary effort is normal. No accessory muscle usage or respiratory distress.     Breath sounds: Wheezing present. No decreased breath sounds  or rales.     Comments: Expiratory wheezes noted throughout. Abdominal:     General: Bowel sounds are normal. There is no distension.     Palpations: Abdomen is soft.     Tenderness: There is no abdominal tenderness.  Musculoskeletal:     Cervical back: Full passive range of motion without pain.     Right lower leg: No edema.     Left lower leg: No edema.  Lymphadenopathy:     Head:     Right side of head: No submental, submandibular, tonsillar, preauricular or posterior auricular adenopathy.     Left side of head: No submental, submandibular, tonsillar, preauricular or posterior auricular adenopathy.     Cervical: No cervical adenopathy.  Skin:    General: Skin is warm.     Capillary Refill: Capillary refill takes less than 2 seconds.  Neurological:     Mental Status: He is alert and oriented to person, place, and time.     Deep Tendon Reflexes: Reflexes are normal and symmetric.     Reflex Scores:      Brachioradialis reflexes are 2+ on the right side and 2+ on the left side.      Patellar reflexes are 2+ on the right side and 2+ on the left side. Psychiatric:        Attention and Perception: Attention normal.        Mood and Affect: Mood normal.        Speech: Speech normal.        Behavior: Behavior normal. Behavior is cooperative.        Thought Content: Thought content normal.     Results for orders placed or performed in visit on 06/05/23  Bayer DCA Hb A1c Waived   Collection Time: 06/05/23  3:57 PM  Result Value Ref Range   HB A1C (BAYER DCA - WAIVED) 6.0 (H) 4.8 - 5.6 %  Comprehensive metabolic panel with GFR   Collection Time: 06/05/23  3:58 PM  Result Value Ref Range   Glucose 120 (H) 70 - 99 mg/dL   BUN 32 (H) 8 - 27 mg/dL   Creatinine, Ser 2.95 (L) 0.76 - 1.27 mg/dL   eGFR 93 >62 ZH/YQM/5.78   BUN/Creatinine Ratio 43 (H) 10 - 24   Sodium 142 134 - 144 mmol/L   Potassium 4.0 3.5 - 5.2 mmol/L   Chloride 104 96 - 106 mmol/L   CO2 19 (L) 20 - 29 mmol/L    Calcium  9.7 8.6 - 10.2 mg/dL   Total Protein 6.4 6.0 - 8.5 g/dL   Albumin 4.4 3.8 - 4.8 g/dL   Globulin, Total 2.0 1.5 - 4.5 g/dL   Bilirubin Total 0.5 0.0 - 1.2 mg/dL   Alkaline Phosphatase 59 44 - 121 IU/L   AST 22 0 - 40 IU/L   ALT 12 0 - 44 IU/L  Lipid Panel w/o Chol/HDL Ratio   Collection Time: 06/05/23  3:58 PM  Result Value Ref Range   Cholesterol, Total 156 100 - 199 mg/dL   Triglycerides 161 (H) 0 - 149 mg/dL   HDL 48 >09 mg/dL   VLDL Cholesterol Cal 41 (H) 5 - 40 mg/dL   LDL Chol Calc (NIH) 67 0 - 99 mg/dL      Assessment & Plan:   Problem List Items Addressed This Visit       Respiratory   Acute non-recurrent maxillary sinusitis - Primary   Acute with symptoms going on 7 days.  Will start a Zpack and recommend he take Claritin 10 MG daily for 7 days.  Avoid Prednisone at this time due to diabetes.  Recommend: - Increased rest - Increasing Fluids - Salt water  gargling, chloraseptic spray and throat lozenges - Mucinex.  - Saline sinus flushes or a neti pot.  - Humidifying the air.       Relevant Medications   azithromycin  (ZITHROMAX ) 250 MG tablet     Follow up plan: Return if symptoms worsen or fail to improve.

## 2023-06-22 ENCOUNTER — Ambulatory Visit

## 2023-06-25 ENCOUNTER — Ambulatory Visit: Attending: Neurology

## 2023-06-25 DIAGNOSIS — R2681 Unsteadiness on feet: Secondary | ICD-10-CM | POA: Insufficient documentation

## 2023-06-25 DIAGNOSIS — M6281 Muscle weakness (generalized): Secondary | ICD-10-CM | POA: Insufficient documentation

## 2023-06-25 DIAGNOSIS — R262 Difficulty in walking, not elsewhere classified: Secondary | ICD-10-CM | POA: Insufficient documentation

## 2023-06-25 DIAGNOSIS — Z9181 History of falling: Secondary | ICD-10-CM | POA: Insufficient documentation

## 2023-06-25 NOTE — Therapy (Signed)
 OUTPATIENT PHYSICAL THERAPY TREATMENT   Patient Name: Tyler Morgan MRN: [id number] DOB:[birth year], 78 y.o., male Today's Date: 06/25/2023  END OF SESSION:  PT End of Session - 06/25/23 0820     Visit Number 4    Number of Visits 17    Date for PT Re-Evaluation 08/10/23    PT Start Time 0820    PT Stop Time 0900    PT Time Calculation (min) 40 min    Activity Tolerance Patient tolerated treatment well    Behavior During Therapy [facility] for tasks assessed/performed                Past Medical History:  Diagnosis Date   Diabetes mellitus without complication [facility])    ED (erectile dysfunction)    Hypertension    Motion sickness    boats   Neuropathy    Prostate enlargement    Urinary retention    Past Surgical History:  Procedure Laterality Date   BASAL CELL CARCINOMA EXCISION     nose    CATARACT EXTRACTION Bilateral 1986, 2014   COLONOSCOPY WITH PROPOFOL  N/A 03/27/2018   Procedure: COLONOSCOPY WITH BIOPSIES;  Surgeon: Selena Daily, MD;  Location: [facility];  Service: Endoscopy;  Laterality: N/A;  Diabetic - oral meds   EYE SURGERY Bilateral [phone]   FRACTURE SURGERY Right    as a child/ right arm   HERNIA REPAIR Right 01-13-14   inguinal hernia   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 N/A 02/07/2017   Procedure: 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;  Surgeon: Dustin Gimenez, MD;  Location: [facility] OR[facility];  Service: Urology;  Laterality: N/A;   POLYPECTOMY N/A 03/27/2018   Procedure: POLYPECTOMY;  Surgeon: Selena Daily, MD;  Location: [facility];  Service: Endoscopy;  Laterality: N/A;   RETINAL DETACHMENT SURGERY  Jan 2015   TONSILLECTOMY  1955   Patient Active Problem List   Diagnosis Date Noted   Acute non-recurrent maxillary sinusitis 06/21/2023   Diabetes mellitus treated with oral medication (HCC) 03/06/2023   Dysphagia 03/06/2023   PVD (peripheral vascular disease) (HCC) 11/30/2021    B12 deficiency 01/29/2019   History of prostate cancer 12/13/2017   Hemorrhoids 12/13/2017   Sensorimotor neuropathy 12/13/2017   Hyperlipidemia associated with type 2 diabetes mellitus (HCC) 04/02/2017   Advanced care planning/counseling discussion 10/11/2016   Type 2 diabetes mellitus with diabetic neuropathy (HCC) 12/15/2014   Hypertension associated with diabetes (HCC)     PCP: Lemar Pyles, NP   REFERRING PROVIDER: Baldemar Bond, FNP  REFERRING DIAG: R26.89 (ICD-10-CM) - Balance problem  Rationale for Evaluation and Treatment: Rehabilitation  THERAPY DIAG:  Unsteadiness on feet  Muscle weakness (generalized)  Difficulty in walking, not elsewhere classified  History of falling  ONSET DATE: 03/12/2023  (Date PT referral signed, chronic condition)  SUBJECTIVE:  SUBJECTIVE STATEMENT: Had a bad cough. Was given a Z-pack and feels better. Might need to take it easy today.      PERTINENT HISTORY:  Balance problem. Got AFOs B feet which helps with ankle DF and gait but limits his ankle movement. The stopping after walking with the AFO's a better but still staggers. Still can't stop and put a camera in front of his face and shoot a picture without wobbling. Also had some pain in his L hand (palmar side) and got a cortisone shot which helped his L hand feel better . The whole L side of his body seems to give him the most problem which makes Dr. Bernetta Brilliant wonder about his diabetic neuropathy diagnosis. Balance might have gotten better since November from the AFOs  Blood pressure is controlled.  No latex allergies  From PT eval on 07/24/2022:   Balance, weakness. Can get around well without a cane. Standing still, pt loses balance. Has to hold onto things to stabilize himself. Has  labradore thats about 78 year old and can walk his dog without a leash because the dog's pulling stabilizes him. Symptoms have been going on for a while and was told to have diabetic neuropathy. Had a fall in 2015 while working on the flooring in his den at home which caused R inguinal hernia S/P mesh placement. Pt states noticing problems in his legs since 2015. Pt also could not push up on his toes (tip toe) since then. No back pain currently but has had a hx of back pain over the years. Decreased sensation B feet, almost not noticeable. Feels like he has a lot of weakness which makes it difficult to keep his balance. Has B LE swelling, comes and goes, not currently getting treated for it. No low back pain, just L arm and shoulder.   PAIN:  Are you having pain? No  PRECAUTIONS: Fall  RED FLAGS: Bowel or bladder incontinence: No and Cauda equina syndrome: No   WEIGHT BEARING RESTRICTIONS: No  FALLS:  Has patient fallen in last 6 months? Yes. Number of falls 1 (pt might have slipped on ice or a wet grass)  LIVING ENVIRONMENT: Lives with: lives with their spouse Lives in: House/apartment Stairs: Yes: External: 1 steps; none Has following equipment at home: Single point cane and Grab bars  OCCUPATION: Conservation officer, historic buildings. Planning on retiring.   PLOF: Mod I.   PATIENT GOALS: Be able to stand still without wobbling.   NEXT MD VISIT: Yes with Dr. Carlis Cherry office Jul 10, 2023  OBJECTIVE:  Note: Objective measures were completed at Evaluation unless otherwise noted.  DIAGNOSTIC FINDINGS:  MR LUMBAR SPINE WO CONTRAST  12/26/2016   Narrative & Impression  CLINICAL DATA:  Lower leg neuropathy for several years. No back pain, acute injury or prior relevant surgery.   EXAM: MRI LUMBAR SPINE WITHOUT CONTRAST   TECHNIQUE: Multiplanar, multisequence MR imaging of the lumbar spine was performed. No intravenous contrast was administered.   COMPARISON:  None.   FINDINGS: Segmentation:  Conventional anatomy assumed, with the last open disc space designated L5-S1.   Alignment: There is 11 mm of anterolisthesis at L5-S1 secondary to chronic bilateral L5 pars defects. The alignment is otherwise normal.   Vertebrae: As above, chronic bilateral L5 pars defects. There is associated endplate degeneration at L5-S1. No evidence of acute fracture or worrisome osseous lesion. The visualized sacroiliac joints appear unremarkable.   Conus medullaris: Extends to the T12-L1 level and appears normal.   Paraspinal  and other soft tissues: No significant paraspinal findings.   Disc levels:   No significant disc space findings from T11-12 through L2-3.   L3-4: Mild loss of disc height with annular disc bulging and endplate osteophytes. Mild facet and ligamentous hypertrophy. There is borderline spinal stenosis and mild narrowing of the lateral recesses. The foramina are patent.   L4-5: Disc height and hydration are maintained. There is mild disc bulging, facet and ligamentous hypertrophy. No significant spinal stenosis or nerve root encroachment.   L5-S1: Bilateral L5 pars defects. There is advanced degenerative disc disease with loss of disc height, disc uncovering and endplate osteophytes. There is at least moderate foraminal narrowing bilaterally with probable encroachment on both exiting L5 nerve roots. The spinal canal and lateral recesses are widely patent.   IMPRESSION: 1. 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 There is probable chronic bilateral L5 nerve root encroachment. 2. Mild disc bulging at L3-4, contributing to mild narrowing of the lateral recesses, but no nerve root encroachment.     Electronically Signed   By: Elmon Hagedorn M.D.   On: 12/26/2016 16:46    PATIENT SURVEYS:    COGNITION: Overall cognitive status: Within functional limits for tasks  assessed     SENSATION:   MUSCLE LENGTH:   POSTURE: R shoulder lower, upper thoracic flexion, slight R lateral shift, slight R thoracolumbar convexity,  B knee flexion,   PALPATION:   LUMBAR ROM:   AROM eval  Flexion   Extension   Right lateral flexion   Left lateral flexion   Right rotation   Left rotation    (Blank rows = not tested)  LOWER EXTREMITY ROM:     Active  Right eval Left eval  Hip flexion    Hip extension    Hip abduction    Hip adduction    Hip internal rotation    Hip external rotation    Knee flexion    Knee extension    Ankle dorsiflexion    Ankle plantarflexion    Ankle inversion    Ankle eversion     (Blank rows = not tested)  LOWER EXTREMITY MMT:    MMT Right eval Left eval  Hip flexion    Hip extension    Hip abduction    Hip adduction    Hip internal rotation    Hip external rotation    Knee flexion    Knee extension    Ankle dorsiflexion    Ankle plantarflexion    Ankle inversion    Ankle eversion     (Blank rows = not tested)  LUMBAR SPECIAL TESTS:    FUNCTIONAL TESTS:  DGI without AD: 12/24 5 times Sit <> stand: 31 seconds with B UE assist     B LE weakness felt.     TUG without AD: 13.81 seconds, 11.15 seconds, 10. 25 seconds        GAIT: Distance walked: 60 ft Assistive device utilized: None Level of assistance: SBA Comments: Decreased stance L LE, B pelvic drop  TREATMENT DATE: 06/25/2023  Neuromuscular control  Hooklying   Posterior pelvic tilt 10x5 seconds for 3 sets    Then with hip fallout 10x2 each LE     Increased time secondary to emphasis on proper movement   Crunch 10x3  Clamshell with posterior pelvic tilt blue band 10x2 with 5 second holds   Hip adduction small green ball squeeze isometrics 10x2 with 5 second holds   Lower trunk rotation isometrics with PT  manual resistance 10x5 seconds to the L for 3 sets   To promote strength to decrease R lower trunk rotation posture.    Improved exercise technique, movement at target joints, use of target muscles after mod verbal, visual, tactile cues.       PATIENT EDUCATION:  Education details: there-ex, HEP Person educated: Patient Education method: Explanation Education comprehension: verbalized understanding  HOME EXERCISE PROGRAM: Access Code: [email or web address] URL: [email or web address] Date: 06/14/2023 Prepared by: Suzzane Estes  Exercises - Supine Posterior Pelvic Tilt  - 1 x daily - 7 x weekly - 3 sets - 10 reps - 10 seconds hold - Supine March  - 1 x daily - 7 x weekly - 3 sets - 10 reps - Supine Knee and Hip Extension with Resistance  - 1 x daily - 7 x weekly - 3 sets - 10 reps  Blue band    ASSESSMENT:  CLINICAL IMPRESSION:  Continued working on core strengthening and lumbopelvic control to promote better neve communication from low back to B LE.  Pt tolerated session well without aggravation of symptoms. Pt will benefit from continued skilled physical therapy services to improve strength, balance, and function.     OBJECTIVE IMPAIRMENTS: Abnormal gait, difficulty walking, improper body mechanics, and postural dysfunction.   ACTIVITY LIMITATIONS: carrying, lifting, standing, squatting, stairs, transfers, and locomotion level  PARTICIPATION LIMITATIONS:   PERSONAL FACTORS: Age, Fitness, Past/current experiences, Profession, Time since onset of injury/illness/exacerbation, and 1-2 comorbidities: DM, neuropathy  are also affecting patient's functional outcome.   REHAB POTENTIAL: Fair    CLINICAL DECISION MAKING: Stable/uncomplicated, (balance has improved with B AFO per pt)  EVALUATION COMPLEXITY: Low   GOALS: Goals reviewed with patient? Yes  SHORT TERM GOALS: Target date: 06/22/2023  Pt will be independent with his initial HEP to improve strength, balance,  function.  Baseline: Pt has not yet started his initial HEP (06/12/2023) Goal status: INITIAL   LONG TERM GOALS: Target date: 08/10/2023  Pt will improve his 5 times Sit <> stand time from chair with arms to 12 seconds or less without UE assist as a demonstration of improved functional LE strength.  Baseline: 31 seconds with B UE assist (06/12/2023) Goal status: INITIAL  2.  Pt will improve his Dynamic Gait Index Score to at least 19/24 or more as a demonstration of improved balance.  Baseline: DGI without AD: 12/24 (06/12/2023) Goal status: INITIAL  3.  Pt will improve his (Activities specific Balance Confidence) ABC scale score by at least 10% as a demonstration of improved balance confidence and decreased fall risk.   Baseline: ABC scale score to be obtained next session (06/12/2023); 82.5% (06/25/2023) Goal status: INITIAL   PLAN:  PT FREQUENCY: 1-2x/week  PT DURATION: 8 weeks  PLANNED INTERVENTIONS: 97110-Therapeutic exercises, 97530- Therapeutic activity, 97112- Neuromuscular re-education, 97535- Self Care, [id number]- Manual therapy, [id number]- Gait training, [phone]- Canalith repositioning, [id number]- Electrical stimulation (unattended), Patient/Family education, Balance training, and Stair training.  PLAN FOR NEXT SESSION: trunk and glute strengthening, balance training, manual techniques, modalities PRN   Brighten Orndoff, PT, DPT 06/25/2023,  9:21 AM

## 2023-06-27 ENCOUNTER — Ambulatory Visit

## 2023-06-27 DIAGNOSIS — R2681 Unsteadiness on feet: Secondary | ICD-10-CM | POA: Diagnosis not present

## 2023-06-27 DIAGNOSIS — Z9181 History of falling: Secondary | ICD-10-CM

## 2023-06-27 DIAGNOSIS — M6281 Muscle weakness (generalized): Secondary | ICD-10-CM

## 2023-06-27 DIAGNOSIS — R262 Difficulty in walking, not elsewhere classified: Secondary | ICD-10-CM

## 2023-06-27 NOTE — Therapy (Signed)
 OUTPATIENT PHYSICAL THERAPY TREATMENT   Patient Name: Tyler Morgan MRN: [id number] DOB:1945-12-27, 78 y.o., male Today's Date: 06/27/2023  END OF SESSION:  PT End of Session - 06/27/23 0819     Visit Number 5    Number of Visits 17    Date for PT Re-Evaluation 08/10/23    PT Start Time 0819    PT Stop Time 0900    PT Time Calculation (min) 41 min    Activity Tolerance Patient tolerated treatment well    Behavior During Therapy [facility] for tasks assessed/performed                 Past Medical History:  Diagnosis Date   Diabetes mellitus without complication [facility])    ED (erectile dysfunction)    Hypertension    Motion sickness    boats   Neuropathy    Prostate enlargement    Urinary retention    Past Surgical History:  Procedure Laterality Date   BASAL CELL CARCINOMA EXCISION     nose    CATARACT EXTRACTION Bilateral 1986, 2014   COLONOSCOPY WITH PROPOFOL  N/A 03/27/2018   Procedure: COLONOSCOPY WITH BIOPSIES;  Surgeon: Selena Daily, MD;  Location: [facility];  Service: Endoscopy;  Laterality: N/A;  Diabetic - oral meds   EYE SURGERY Bilateral [phone]   FRACTURE SURGERY Right    as a child/ right arm   HERNIA REPAIR Right 01-13-14   inguinal hernia   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 N/A 02/07/2017   Procedure: 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;  Surgeon: Dustin Gimenez, MD;  Location: [facility];  Service: Urology;  Laterality: N/A;   POLYPECTOMY N/A 03/27/2018   Procedure: POLYPECTOMY;  Surgeon: Selena Daily, MD;  Location: [facility];  Service: Endoscopy;  Laterality: N/A;   RETINAL DETACHMENT SURGERY  Jan 2015   TONSILLECTOMY  1955   Patient Active Problem List   Diagnosis Date Noted   Acute non-recurrent maxillary sinusitis 06/21/2023   Diabetes mellitus treated with oral medication (HCC) 03/06/2023   Dysphagia 03/06/2023   PVD (peripheral vascular disease) (HCC) 11/30/2021    B12 deficiency 01/29/2019   History of prostate cancer 12/13/2017   Hemorrhoids 12/13/2017   Sensorimotor neuropathy 12/13/2017   Hyperlipidemia associated with type 2 diabetes mellitus (HCC) 04/02/2017   Advanced care planning/counseling discussion 10/11/2016   Type 2 diabetes mellitus with diabetic neuropathy (HCC) 12/15/2014   Hypertension associated with diabetes (HCC)     PCP: Lemar Pyles, NP   REFERRING PROVIDER: Baldemar Bond, FNP  REFERRING DIAG: R26.89 (ICD-10-CM) - Balance problem  Rationale for Evaluation and Treatment: Rehabilitation  THERAPY DIAG:  Unsteadiness on feet  Muscle weakness (generalized)  Difficulty in walking, not elsewhere classified  History of falling  ONSET DATE: 03/12/2023  (Date PT referral signed, chronic condition)  SUBJECTIVE:  SUBJECTIVE STATEMENT: Still has a little raspiness in his voice. Doing ok with his walking and strength. Walking is not a problem, the stopping is.       PERTINENT HISTORY:  Balance problem. Got AFOs B feet which helps with ankle DF and gait but limits his ankle movement. The stopping after walking with the AFO's a better but still staggers. Still can't stop and put a camera in front of his face and shoot a picture without wobbling. Also had some pain in his L hand (palmar side) and got a cortisone shot which helped his L hand feel better . The whole L side of his body seems to give him the most problem which makes Dr. Bernetta Brilliant wonder about his diabetic neuropathy diagnosis. Balance might have gotten better since November from the AFOs  Blood pressure is controlled.  No latex allergies  From PT eval on 07/24/2022:   Balance, weakness. Can get around well without a cane. Standing still, pt loses balance. Has to hold  onto things to stabilize himself. Has labradore thats about 78 year old and can walk his dog without a leash because the dog's pulling stabilizes him. Symptoms have been going on for a while and was told to have diabetic neuropathy. Had a fall in 2015 while working on the flooring in his den at home which caused R inguinal hernia S/P mesh placement. Pt states noticing problems in his legs since 2015. Pt also could not push up on his toes (tip toe) since then. No back pain currently but has had a hx of back pain over the years. Decreased sensation B feet, almost not noticeable. Feels like he has a lot of weakness which makes it difficult to keep his balance. Has B LE swelling, comes and goes, not currently getting treated for it. No low back pain, just L arm and shoulder.   PAIN:  Are you having pain? No  PRECAUTIONS: Fall  RED FLAGS: Bowel or bladder incontinence: No and Cauda equina syndrome: No   WEIGHT BEARING RESTRICTIONS: No  FALLS:  Has patient fallen in last 6 months? Yes. Number of falls 1 (pt might have slipped on ice or a wet grass)  LIVING ENVIRONMENT: Lives with: lives with their spouse Lives in: House/apartment Stairs: Yes: External: 1 steps; none Has following equipment at home: Single point cane and Grab bars  OCCUPATION: Conservation officer, historic buildings. Planning on retiring.   PLOF: Mod I.   PATIENT GOALS: Be able to stand still without wobbling.   NEXT MD VISIT: Yes with Dr. Carlis Cherry office Jul 10, 2023  OBJECTIVE:  Note: Objective measures were completed at Evaluation unless otherwise noted.  DIAGNOSTIC FINDINGS:  MR LUMBAR SPINE WO CONTRAST  12/26/2016   Narrative & Impression  CLINICAL DATA:  Lower leg neuropathy for several years. No back pain, acute injury or prior relevant surgery.   EXAM: MRI LUMBAR SPINE WITHOUT CONTRAST   TECHNIQUE: Multiplanar, multisequence MR imaging of the lumbar spine was performed. No intravenous contrast was administered.   COMPARISON:   None.   FINDINGS: Segmentation: Conventional anatomy assumed, with the last open disc space designated L5-S1.   Alignment: There is 11 mm of anterolisthesis at L5-S1 secondary to chronic bilateral L5 pars defects. The alignment is otherwise normal.   Vertebrae: As above, chronic bilateral L5 pars defects. There is associated endplate degeneration at L5-S1. No evidence of acute fracture or worrisome osseous lesion. The visualized sacroiliac joints appear unremarkable.   Conus medullaris: Extends to the T12-L1 level  and appears normal.   Paraspinal and other soft tissues: No significant paraspinal findings.   Disc levels:   No significant disc space findings from T11-12 through L2-3.   L3-4: Mild loss of disc height with annular disc bulging and endplate osteophytes. Mild facet and ligamentous hypertrophy. There is borderline spinal stenosis and mild narrowing of the lateral recesses. The foramina are patent.   L4-5: Disc height and hydration are maintained. There is mild disc bulging, facet and ligamentous hypertrophy. No significant spinal stenosis or nerve root encroachment.   L5-S1: Bilateral L5 pars defects. There is advanced degenerative disc disease with loss of disc height, disc uncovering and endplate osteophytes. There is at least moderate foraminal narrowing bilaterally with probable encroachment on both exiting L5 nerve roots. The spinal canal and lateral recesses are widely patent.   IMPRESSION: 1. 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 There is probable chronic bilateral L5 nerve root encroachment. 2. Mild disc bulging at L3-4, contributing to mild narrowing of the lateral recesses, but no nerve root encroachment.     Electronically Signed   By: Elmon Hagedorn M.D.   On: 12/26/2016 16:46    PATIENT SURVEYS:    COGNITION: Overall cognitive status:  Within functional limits for tasks assessed     SENSATION:   MUSCLE LENGTH:   POSTURE: R shoulder lower, upper thoracic flexion, slight R lateral shift, slight R thoracolumbar convexity,  B knee flexion,   PALPATION:   LUMBAR ROM:   AROM eval  Flexion   Extension   Right lateral flexion   Left lateral flexion   Right rotation   Left rotation    (Blank rows = not tested)  LOWER EXTREMITY ROM:     Active  Right eval Left eval  Hip flexion    Hip extension    Hip abduction    Hip adduction    Hip internal rotation    Hip external rotation    Knee flexion    Knee extension    Ankle dorsiflexion    Ankle plantarflexion    Ankle inversion    Ankle eversion     (Blank rows = not tested)  LOWER EXTREMITY MMT:    MMT Right eval Left eval  Hip flexion    Hip extension    Hip abduction    Hip adduction    Hip internal rotation    Hip external rotation    Knee flexion    Knee extension    Ankle dorsiflexion    Ankle plantarflexion    Ankle inversion    Ankle eversion     (Blank rows = not tested)  LUMBAR SPECIAL TESTS:    FUNCTIONAL TESTS:  DGI without AD: 12/24 5 times Sit <> stand: 31 seconds with B UE assist     B LE weakness felt.     TUG without AD: 13.81 seconds, 11.15 seconds, 10. 25 seconds        GAIT: Distance walked: 60 ft Assistive device utilized: None Level of assistance: SBA Comments: Decreased stance L LE, B pelvic drop  TREATMENT DATE: 06/27/2023  Neuromuscular control  Seated B shoulder extension with scapular retraction  red band 10x3 with 5 second holds  Seated pallof press  L double red band 10x10 seconds    L shoulder discomfort   R double red band 10x10 seconds   Standing static mini lunge with contralateral UE assist   R 10x2  R 10x2  Static standing  Feet shoulder width apart   Eyes  closed, 10 seconds x 10 with light touch assist    Pt states weakness with B PF for balance   Seated chin tuck 10x5 seconds to promote thoracic extensio nto decrease lumbar extension stress. 10x5 seconds     Improved exercise technique, movement at target joints, use of target muscles after mod verbal, visual, tactile cues.       PATIENT EDUCATION:  Education details: there-ex, HEP Person educated: Patient Education method: Explanation Education comprehension: verbalized understanding  HOME EXERCISE PROGRAM: Access Code: [email or web address] URL: [email or web address] Date: 06/14/2023 Prepared by: Suzzane Estes  Exercises - Supine Posterior Pelvic Tilt  - 1 x daily - 7 x weekly - 3 sets - 10 reps - 10 seconds hold - Supine March  - 1 x daily - 7 x weekly - 3 sets - 10 reps - Supine Knee and Hip Extension with Resistance  - 1 x daily - 7 x weekly - 3 sets - 10 reps  Blue band - Seated Cervical Retraction  - 3 x daily - 7 x weekly - 3 sets - 10 reps - 5 seconds hold   ASSESSMENT:  CLINICAL IMPRESSION: Worked on core and glute strengthening in the upright posture promote better neve communication from low back to B LE. Worked on static standing balance to help decrease fall risk when stopping after movement. Difficulty with balance reported secondary to B ankle PF weakness. Pt tolerated session well without aggravation of symptoms. Pt will benefit from continued skilled physical therapy services to improve strength, balance, and function.     OBJECTIVE IMPAIRMENTS: Abnormal gait, difficulty walking, improper body mechanics, and postural dysfunction.   ACTIVITY LIMITATIONS: carrying, lifting, standing, squatting, stairs, transfers, and locomotion level  PARTICIPATION LIMITATIONS:   PERSONAL FACTORS: Age, Fitness, Past/current experiences, Profession, Time since onset of injury/illness/exacerbation, and 1-2 comorbidities: DM, neuropathy  are also affecting patient's  functional outcome.   REHAB POTENTIAL: Fair    CLINICAL DECISION MAKING: Stable/uncomplicated, (balance has improved with B AFO per pt)  EVALUATION COMPLEXITY: Low   GOALS: Goals reviewed with patient? Yes  SHORT TERM GOALS: Target date: 06/22/2023  Pt will be independent with his initial HEP to improve strength, balance, function.  Baseline: Pt has not yet started his initial HEP (06/12/2023) Goal status: INITIAL   LONG TERM GOALS: Target date: 08/10/2023  Pt will improve his 5 times Sit <> stand time from chair with arms to 12 seconds or less without UE assist as a demonstration of improved functional LE strength.  Baseline: 31 seconds with B UE assist (06/12/2023) Goal status: INITIAL  2.  Pt will improve his Dynamic Gait Index Score to at least 19/24 or more as a demonstration of improved balance.  Baseline: DGI without AD: 12/24 (06/12/2023) Goal status: INITIAL  3.  Pt will improve his (Activities specific Balance Confidence) ABC scale score by at least 10% as a demonstration of improved balance confidence and decreased fall risk.   Baseline: ABC scale score to be obtained next session (06/12/2023); 82.5% (06/25/2023) Goal status: INITIAL   PLAN:  PT FREQUENCY: 1-2x/week  PT  DURATION: 8 weeks  PLANNED INTERVENTIONS: [id number]-Therapeutic exercises, 97530- Therapeutic activity, [id number]- Neuromuscular re-education, 97535- Self Care, [id number]- Manual therapy, [phone]- Gait training, [phone]- Canalith repositioning, [id number]- Electrical stimulation (unattended), Patient/Family education, Balance training, and Stair training.  PLAN FOR NEXT SESSION: trunk and glute strengthening, balance training, manual techniques, modalities PRN   Jabar Krysiak, PT, DPT 06/27/2023, 10:50 AM

## 2023-07-02 ENCOUNTER — Other Ambulatory Visit: Payer: Self-pay | Admitting: Nurse Practitioner

## 2023-07-03 ENCOUNTER — Ambulatory Visit

## 2023-07-03 DIAGNOSIS — R262 Difficulty in walking, not elsewhere classified: Secondary | ICD-10-CM | POA: Diagnosis not present

## 2023-07-03 DIAGNOSIS — Z9181 History of falling: Secondary | ICD-10-CM | POA: Diagnosis not present

## 2023-07-03 DIAGNOSIS — R2681 Unsteadiness on feet: Secondary | ICD-10-CM

## 2023-07-03 DIAGNOSIS — M6281 Muscle weakness (generalized): Secondary | ICD-10-CM

## 2023-07-03 NOTE — Therapy (Signed)
 OUTPATIENT PHYSICAL THERAPY TREATMENT   Patient Name: Tyler Morgan MRN: [id number] DOB:July 25, 1945, 78 y.o., male Today's Date: 07/03/2023  END OF SESSION:  PT End of Session - 07/03/23 0817     Visit Number 6    Number of Visits 17    Date for PT Re-Evaluation 08/10/23    PT Start Time 0817    PT Stop Time 0900    PT Time Calculation (min) 43 min    Activity Tolerance Patient tolerated treatment well    Behavior During Therapy [facility] for tasks assessed/performed                  Past Medical History:  Diagnosis Date   Diabetes mellitus without complication [facility])    ED (erectile dysfunction)    Hypertension    Motion sickness    boats   Neuropathy    Prostate enlargement    Urinary retention    Past Surgical History:  Procedure Laterality Date   BASAL CELL CARCINOMA EXCISION     nose    CATARACT EXTRACTION Bilateral 1986, 2014   COLONOSCOPY WITH PROPOFOL  N/A 03/27/2018   Procedure: COLONOSCOPY WITH BIOPSIES;  Surgeon: Selena Daily, MD;  Location: [facility];  Service: Endoscopy;  Laterality: N/A;  Diabetic - oral meds   EYE SURGERY Bilateral [phone]   FRACTURE SURGERY Right    as a child/ right arm   HERNIA REPAIR Right 01-13-14   inguinal hernia   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 N/A 02/07/2017   Procedure: 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;  Surgeon: Dustin Gimenez, MD;  Location: [facility];  Service: Urology;  Laterality: N/A;   POLYPECTOMY N/A 03/27/2018   Procedure: POLYPECTOMY;  Surgeon: Selena Daily, MD;  Location: [facility];  Service: Endoscopy;  Laterality: N/A;   RETINAL DETACHMENT SURGERY  Jan 2015   TONSILLECTOMY  1955   Patient Active Problem List   Diagnosis Date Noted   Acute non-recurrent maxillary sinusitis 06/21/2023   Diabetes mellitus treated with oral medication (HCC) 03/06/2023   Dysphagia 03/06/2023   PVD (peripheral vascular disease) (HCC)  11/30/2021   B12 deficiency 01/29/2019   History of prostate cancer 12/13/2017   Hemorrhoids 12/13/2017   Sensorimotor neuropathy 12/13/2017   Hyperlipidemia associated with type 2 diabetes mellitus (HCC) 04/02/2017   Advanced care planning/counseling discussion 10/11/2016   Type 2 diabetes mellitus with diabetic neuropathy (HCC) 12/15/2014   Hypertension associated with diabetes (HCC)     PCP: Lemar Pyles, NP   REFERRING PROVIDER: Baldemar Bond, FNP  REFERRING DIAG: R26.89 (ICD-10-CM) - Balance problem  Rationale for Evaluation and Treatment: Rehabilitation  THERAPY DIAG:  Unsteadiness on feet  Muscle weakness (generalized)  Difficulty in walking, not elsewhere classified  History of falling  ONSET DATE: 03/12/2023  (Date PT referral signed, chronic condition)  SUBJECTIVE:  SUBJECTIVE STATEMENT: Feels like he has an inner ear balance issue today. Feels dizzy with getting up from the lying position as well as from the sitting position. Usually has low blood pressure        PERTINENT HISTORY:  Balance problem. Got AFOs B feet which helps with ankle DF and gait but limits his ankle movement. The stopping after walking with the AFO's a better but still staggers. Still can't stop and put a camera in front of his face and shoot a picture without wobbling. Also had some pain in his L hand (palmar side) and got a cortisone shot which helped his L hand feel better . The whole L side of his body seems to give him the most problem which makes Dr. Bernetta Brilliant wonder about his diabetic neuropathy diagnosis. Balance might have gotten better since November from the AFOs  Blood pressure is controlled.  No latex allergies  From PT eval on 07/24/2022:   Balance, weakness. Can get around well  without a cane. Standing still, pt loses balance. Has to hold onto things to stabilize himself. Has labradore thats about 78 year old and can walk his dog without a leash because the dog's pulling stabilizes him. Symptoms have been going on for a while and was told to have diabetic neuropathy. Had a fall in 2015 while working on the flooring in his den at home which caused R inguinal hernia S/P mesh placement. Pt states noticing problems in his legs since 2015. Pt also could not push up on his toes (tip toe) since then. No back pain currently but has had a hx of back pain over the years. Decreased sensation B feet, almost not noticeable. Feels like he has a lot of weakness which makes it difficult to keep his balance. Has B LE swelling, comes and goes, not currently getting treated for it. No low back pain, just L arm and shoulder.   PAIN:  Are you having pain? No  PRECAUTIONS: Fall  RED FLAGS: Bowel or bladder incontinence: No and Cauda equina syndrome: No   WEIGHT BEARING RESTRICTIONS: No  FALLS:  Has patient fallen in last 6 months? Yes. Number of falls 1 (pt might have slipped on ice or a wet grass)  LIVING ENVIRONMENT: Lives with: lives with their spouse Lives in: House/apartment Stairs: Yes: External: 1 steps; none Has following equipment at home: Single point cane and Grab bars  OCCUPATION: Conservation officer, historic buildings. Planning on retiring.   PLOF: Mod I.   PATIENT GOALS: Be able to stand still without wobbling.   NEXT MD VISIT: Yes with Dr. Carlis Cherry office Jul 10, 2023  OBJECTIVE:  Note: Objective measures were completed at Evaluation unless otherwise noted.  DIAGNOSTIC FINDINGS:  MR LUMBAR SPINE WO CONTRAST  12/26/2016   Narrative & Impression  CLINICAL DATA:  Lower leg neuropathy for several years. No back pain, acute injury or prior relevant surgery.   EXAM: MRI LUMBAR SPINE WITHOUT CONTRAST   TECHNIQUE: Multiplanar, multisequence MR imaging of the lumbar spine  was performed. No intravenous contrast was administered.   COMPARISON:  None.   FINDINGS: Segmentation: Conventional anatomy assumed, with the last open disc space designated L5-S1.   Alignment: There is 11 mm of anterolisthesis at L5-S1 secondary to chronic bilateral L5 pars defects. The alignment is otherwise normal.   Vertebrae: As above, chronic bilateral L5 pars defects. There is associated endplate degeneration at L5-S1. No evidence of acute fracture or worrisome osseous lesion. The visualized sacroiliac joints appear unremarkable.  Conus medullaris: Extends to the T12-L1 level and appears normal.   Paraspinal and other soft tissues: No significant paraspinal findings.   Disc levels:   No significant disc space findings from T11-12 through L2-3.   L3-4: Mild loss of disc height with annular disc bulging and endplate osteophytes. Mild facet and ligamentous hypertrophy. There is borderline spinal stenosis and mild narrowing of the lateral recesses. The foramina are patent.   L4-5: Disc height and hydration are maintained. There is mild disc bulging, facet and ligamentous hypertrophy. No significant spinal stenosis or nerve root encroachment.   L5-S1: Bilateral L5 pars defects. There is advanced degenerative disc disease with loss of disc height, disc uncovering and endplate osteophytes. There is at least moderate foraminal narrowing bilaterally with probable encroachment on both exiting L5 nerve roots. The spinal canal and lateral recesses are widely patent.   IMPRESSION: 1. 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 There is probable chronic bilateral L5 nerve root encroachment. 2. Mild disc bulging at L3-4, contributing to mild narrowing of the lateral recesses, but no nerve root encroachment.     Electronically Signed   By: Elmon Hagedorn M.D.   On: 12/26/2016  16:46    PATIENT SURVEYS:    COGNITION: Overall cognitive status: Within functional limits for tasks assessed     SENSATION:   MUSCLE LENGTH:   POSTURE: R shoulder lower, upper thoracic flexion, slight R lateral shift, slight R thoracolumbar convexity,  B knee flexion,   PALPATION:   LUMBAR ROM:   AROM eval  Flexion   Extension   Right lateral flexion   Left lateral flexion   Right rotation   Left rotation    (Blank rows = not tested)  LOWER EXTREMITY ROM:     Active  Right eval Left eval  Hip flexion    Hip extension    Hip abduction    Hip adduction    Hip internal rotation    Hip external rotation    Knee flexion    Knee extension    Ankle dorsiflexion    Ankle plantarflexion    Ankle inversion    Ankle eversion     (Blank rows = not tested)  LOWER EXTREMITY MMT:    MMT Right eval Left eval  Hip flexion    Hip extension    Hip abduction    Hip adduction    Hip internal rotation    Hip external rotation    Knee flexion    Knee extension    Ankle dorsiflexion    Ankle plantarflexion    Ankle inversion    Ankle eversion     (Blank rows = not tested)  LUMBAR SPECIAL TESTS:    FUNCTIONAL TESTS:  DGI without AD: 12/24 5 times Sit <> stand: 31 seconds with B UE assist     B LE weakness felt.     TUG without AD: 13.81 seconds, 11.15 seconds, 10. 25 seconds        GAIT: Distance walked: 60 ft Assistive device utilized: None Level of assistance: SBA Comments: Decreased stance L LE, B pelvic drop  TREATMENT DATE: 07/03/2023  Neuromuscular re education  [employer]  L Negative  R Negative  Blood pressure L arm sitting, mechanically taken, normal cuff: 130/76, HR 66  Seated B scapular retraction with chin tuck to promote thoracic extension and decrease neck tightness for possible cervicogenic dizziness.  10x10 seconds   Gait with changes in speed 30 ft x 5  Backwards walking 30 ft x 3  Eyes closed: Firm surface: Standing forward weight shift onto forefeet with slight hip and knee flexion secondary to AFO restrictions 10x10 second sfor 2 sets  On top of air Ex Pad 10x10 seconds   Gait with 180 degree turns and stop 6x    Improved exercise technique, movement at target joints, use of target muscles after mod verbal, visual, tactile cues.       PATIENT EDUCATION:  Education details: there-ex, HEP Person educated: Patient Education method: Explanation Education comprehension: verbalized understanding  HOME EXERCISE PROGRAM: Access Code: [email or web address] URL: [email or web address] Date: 06/14/2023 Prepared by: Suzzane Estes  Exercises - Supine Posterior Pelvic Tilt  - 1 x daily - 7 x weekly - 3 sets - 10 reps - 10 seconds hold - Supine March  - 1 x daily - 7 x weekly - 3 sets - 10 reps - Supine Knee and Hip Extension with Resistance  - 1 x daily - 7 x weekly - 3 sets - 10 reps  Blue band - Seated Cervical Retraction  - 3 x daily - 7 x weekly - 3 sets - 10 reps - 5 seconds hold   ASSESSMENT:  CLINICAL IMPRESSION: Pt demonstrates negative Dix Hallpike and no hypotension today ruling out BPPV or possible orthostatic involvement this morning. Worked on static standing balance after movement. Pt demonstrates tendency to lose balance backwards resulting in stepping back to regain balance. Pt able to maintain static balance after stopping when cued to forward weight shift. Slight knee and hip flexion to shift his center of gravity over his base of support secondary to B ankle PF weakness for which pt uses AFO. Pt tolerated session well without aggravation of symptoms. Pt will benefit from continued skilled physical therapy services to improve strength, balance, and function.     OBJECTIVE IMPAIRMENTS: Abnormal gait, difficulty walking, improper body mechanics, and postural  dysfunction.   ACTIVITY LIMITATIONS: carrying, lifting, standing, squatting, stairs, transfers, and locomotion level  PARTICIPATION LIMITATIONS:   PERSONAL FACTORS: Age, Fitness, Past/current experiences, Profession, Time since onset of injury/illness/exacerbation, and 1-2 comorbidities: DM, neuropathy are also affecting patient's functional outcome.   REHAB POTENTIAL: Fair    CLINICAL DECISION MAKING: Stable/uncomplicated, (balance has improved with B AFO per pt)  EVALUATION COMPLEXITY: Low   GOALS: Goals reviewed with patient? Yes  SHORT TERM GOALS: Target date: 06/22/2023  Pt will be independent with his initial HEP to improve strength, balance, function.  Baseline: Pt has not yet started his initial HEP (06/12/2023) Goal status: INITIAL   LONG TERM GOALS: Target date: 08/10/2023  Pt will improve his 5 times Sit <> stand time from chair with arms to 12 seconds or less without UE assist as a demonstration of improved functional LE strength.  Baseline: 31 seconds with B UE assist (06/12/2023) Goal status: INITIAL  2.  Pt will improve his Dynamic Gait Index Score to at least 19/24 or more as a demonstration of improved balance.  Baseline: DGI without AD: 12/24 (06/12/2023) Goal status: INITIAL  3.  Pt will improve his (Activities specific Balance Confidence) ABC scale score by at least 10% as a  demonstration of improved balance confidence and decreased fall risk.   Baseline: ABC scale score to be obtained next session (06/12/2023); 82.5% (06/25/2023) Goal status: INITIAL   PLAN:  PT FREQUENCY: 1-2x/week  PT DURATION: 8 weeks  PLANNED INTERVENTIONS: 97110-Therapeutic exercises, 97530- Therapeutic activity, 97112- Neuromuscular re-education, 97535- Self Care, 96045- Manual therapy, [id number]- Gait training, [id number]- Canalith repositioning, [id number]- Electrical stimulation (unattended), Patient/Family education, Balance training, and Stair training.  PLAN FOR NEXT SESSION: trunk and glute  strengthening, balance training, manual techniques, modalities PRN   Apolonio Cutting, PT, DPT 07/03/2023, 9:14 AM

## 2023-07-05 ENCOUNTER — Ambulatory Visit

## 2023-07-05 DIAGNOSIS — R262 Difficulty in walking, not elsewhere classified: Secondary | ICD-10-CM

## 2023-07-05 DIAGNOSIS — M6281 Muscle weakness (generalized): Secondary | ICD-10-CM

## 2023-07-05 DIAGNOSIS — Z9181 History of falling: Secondary | ICD-10-CM

## 2023-07-05 DIAGNOSIS — R2681 Unsteadiness on feet: Secondary | ICD-10-CM

## 2023-07-05 NOTE — Therapy (Signed)
 OUTPATIENT PHYSICAL THERAPY TREATMENT   Patient Name: Tyler Morgan MRN: [id number] DOB:1945-02-28, 78 y.o., male Today's Date: 07/05/2023  END OF SESSION:  PT End of Session - 07/05/23 0817     Visit Number 7    Number of Visits 17    Date for PT Re-Evaluation 08/10/23    PT Start Time 0817    PT Stop Time 0858    PT Time Calculation (min) 41 min    Activity Tolerance Patient tolerated treatment well    Behavior During Therapy [facility] for tasks assessed/performed                   Past Medical History:  Diagnosis Date   Diabetes mellitus without complication [facility])    ED (erectile dysfunction)    Hypertension    Motion sickness    boats   Neuropathy    Prostate enlargement    Urinary retention    Past Surgical History:  Procedure Laterality Date   BASAL CELL CARCINOMA EXCISION     nose    CATARACT EXTRACTION Bilateral 1986, 2014   COLONOSCOPY WITH PROPOFOL  N/A 03/27/2018   Procedure: COLONOSCOPY WITH BIOPSIES;  Surgeon: Selena Daily, MD;  Location: [facility];  Service: Endoscopy;  Laterality: N/A;  Diabetic - oral meds   EYE SURGERY Bilateral [phone]   FRACTURE SURGERY Right    as a child/ right arm   HERNIA REPAIR Right 01-13-14   inguinal hernia   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 N/A 02/07/2017   Procedure: 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;  Surgeon: Dustin Gimenez, MD;  Location: [facility];  Service: Urology;  Laterality: N/A;   POLYPECTOMY N/A 03/27/2018   Procedure: POLYPECTOMY;  Surgeon: Selena Daily, MD;  Location: [facility];  Service: Endoscopy;  Laterality: N/A;   RETINAL DETACHMENT SURGERY  Jan 2015   TONSILLECTOMY  1955   Patient Active Problem List   Diagnosis Date Noted   Acute non-recurrent maxillary sinusitis 06/21/2023   Diabetes mellitus treated with oral medication (HCC) 03/06/2023   Dysphagia 03/06/2023   PVD (peripheral vascular disease) (HCC)  11/30/2021   B12 deficiency 01/29/2019   History of prostate cancer 12/13/2017   Hemorrhoids 12/13/2017   Sensorimotor neuropathy 12/13/2017   Hyperlipidemia associated with type 2 diabetes mellitus (HCC) 04/02/2017   Advanced care planning/counseling discussion 10/11/2016   Type 2 diabetes mellitus with diabetic neuropathy (HCC) 12/15/2014   Hypertension associated with diabetes (HCC)     PCP: Lemar Pyles, NP   REFERRING PROVIDER: Baldemar Bond, FNP  REFERRING DIAG: R26.89 (ICD-10-CM) - Balance problem  Rationale for Evaluation and Treatment: Rehabilitation  THERAPY DIAG:  Unsteadiness on feet  Muscle weakness (generalized)  Difficulty in walking, not elsewhere classified  History of falling  ONSET DATE: 03/12/2023  (Date PT referral signed, chronic condition)  SUBJECTIVE:  SUBJECTIVE STATEMENT: Doing ok. Having work related stress.  PERTINENT HISTORY:  Balance problem. Got AFOs B feet which helps with ankle DF and gait but limits his ankle movement. The stopping after walking with the AFO's a better but still staggers. Still can't stop and put a camera in front of his face and shoot a picture without wobbling. Also had some pain in his L hand (palmar side) and got a cortisone shot which helped his L hand feel better . The whole L side of his body seems to give him the most problem which makes Dr. Bernetta Brilliant wonder about his diabetic neuropathy diagnosis. Balance might have gotten better since November from the AFOs  Blood pressure is controlled.  No latex allergies  From PT eval on 07/24/2022:   Balance, weakness. Can get around well without a cane. Standing still, pt loses balance. Has to hold onto things to stabilize himself. Has labradore thats about 78 year old and can walk his  dog without a leash because the dog's pulling stabilizes him. Symptoms have been going on for a while and was told to have diabetic neuropathy. Had a fall in 2015 while working on the flooring in his den at home which caused R inguinal hernia S/P mesh placement. Pt states noticing problems in his legs since 2015. Pt also could not push up on his toes (tip toe) since then. No back pain currently but has had a hx of back pain over the years. Decreased sensation B feet, almost not noticeable. Feels like he has a lot of weakness which makes it difficult to keep his balance. Has B LE swelling, comes and goes, not currently getting treated for it. No low back pain, just L arm and shoulder.   PAIN:  Are you having pain? No  PRECAUTIONS: Fall  RED FLAGS: Bowel or bladder incontinence: No and Cauda equina syndrome: No   WEIGHT BEARING RESTRICTIONS: No  FALLS:  Has patient fallen in last 6 months? Yes. Number of falls 1 (pt might have slipped on ice or a wet grass)  LIVING ENVIRONMENT: Lives with: lives with their spouse Lives in: House/apartment Stairs: Yes: External: 1 steps; none Has following equipment at home: Single point cane and Grab bars  OCCUPATION: Conservation officer, historic buildings. Planning on retiring.   PLOF: Mod I.   PATIENT GOALS: Be able to stand still without wobbling.   NEXT MD VISIT: Yes with Dr. Carlis Cherry office Jul 10, 2023  OBJECTIVE:  Note: Objective measures were completed at Evaluation unless otherwise noted.  DIAGNOSTIC FINDINGS:  MR LUMBAR SPINE WO CONTRAST  12/26/2016   Narrative & Impression  CLINICAL DATA:  Lower leg neuropathy for several years. No back pain, acute injury or prior relevant surgery.   EXAM: MRI LUMBAR SPINE WITHOUT CONTRAST   TECHNIQUE: Multiplanar, multisequence MR imaging of the lumbar spine was performed. No intravenous contrast was administered.   COMPARISON:  None.   FINDINGS: Segmentation: Conventional anatomy assumed, with the last open  disc space designated L5-S1.   Alignment: There is 11 mm of anterolisthesis at L5-S1 secondary to chronic bilateral L5 pars defects. The alignment is otherwise normal.   Vertebrae: As above, chronic bilateral L5 pars defects. There is associated endplate degeneration at L5-S1. No evidence of acute fracture or worrisome osseous lesion. The visualized sacroiliac joints appear unremarkable.   Conus medullaris: Extends to the T12-L1 level and appears normal.   Paraspinal and other soft tissues: No significant paraspinal findings.   Disc levels:   No significant  disc space findings from T11-12 through L2-3.   L3-4: Mild loss of disc height with annular disc bulging and endplate osteophytes. Mild facet and ligamentous hypertrophy. There is borderline spinal stenosis and mild narrowing of the lateral recesses. The foramina are patent.   L4-5: Disc height and hydration are maintained. There is mild disc bulging, facet and ligamentous hypertrophy. No significant spinal stenosis or nerve root encroachment.   L5-S1: Bilateral L5 pars defects. There is advanced degenerative disc disease with loss of disc height, disc uncovering and endplate osteophytes. There is at least moderate foraminal narrowing bilaterally with probable encroachment on both exiting L5 nerve roots. The spinal canal and lateral recesses are widely patent.   IMPRESSION: 1. 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 There is probable chronic bilateral L5 nerve root encroachment. 2. Mild disc bulging at L3-4, contributing to mild narrowing of the lateral recesses, but no nerve root encroachment.     Electronically Signed   By: Elmon Hagedorn M.D.   On: 12/26/2016 16:46    PATIENT SURVEYS:    COGNITION: Overall cognitive status: Within functional limits for tasks assessed     SENSATION:   MUSCLE  LENGTH:   POSTURE: R shoulder lower, upper thoracic flexion, slight R lateral shift, slight R thoracolumbar convexity,  B knee flexion,   PALPATION:   LUMBAR ROM:   AROM eval  Flexion   Extension   Right lateral flexion   Left lateral flexion   Right rotation   Left rotation    (Blank rows = not tested)  LOWER EXTREMITY ROM:     Active  Right eval Left eval  Hip flexion    Hip extension    Hip abduction    Hip adduction    Hip internal rotation    Hip external rotation    Knee flexion    Knee extension    Ankle dorsiflexion    Ankle plantarflexion    Ankle inversion    Ankle eversion     (Blank rows = not tested)  LOWER EXTREMITY MMT:    MMT Right eval Left eval  Hip flexion    Hip extension    Hip abduction    Hip adduction    Hip internal rotation    Hip external rotation    Knee flexion    Knee extension    Ankle dorsiflexion    Ankle plantarflexion    Ankle inversion    Ankle eversion     (Blank rows = not tested)  LUMBAR SPECIAL TESTS:    FUNCTIONAL TESTS:  DGI without AD: 12/24 5 times Sit <> stand: 31 seconds with B UE assist     B LE weakness felt.     TUG without AD: 13.81 seconds, 11.15 seconds, 10. 25 seconds        GAIT: Distance walked: 60 ft Assistive device utilized: None Level of assistance: SBA Comments: Decreased stance L LE, B pelvic drop  TREATMENT DATE: 07/05/2023  Neuromuscular re education  Static lunge with contralateral UE assist   R 10x3 with 5 second holds  L 10x3 with 5 second holds  Eyes closed: Firm surface: Standing forward weight shift onto forefeet with slight hip and knee flexion secondary to AFO restrictions 10x10 second sfor 2 sets  On top of air Ex Pad 10x10 seconds   Backwards walking 30 ft x 3  Gait with changes in speed 30 ft x 6  Gait with 180 degree turns and  stop 4x  Gait with head turns (vertical, horizontal) and with random 180 degree turns and stop 30 ft x 6  Static standing with eyes closed 15 seconds CGA    Improved exercise technique, movement at target joints, use of target muscles after mod verbal, visual, tactile cues.       PATIENT EDUCATION:  Education details: there-ex, HEP Person educated: Patient Education method: Explanation Education comprehension: verbalized understanding  HOME EXERCISE PROGRAM: Access Code: [email or web address] URL: [email or web address] Date: 06/14/2023 Prepared by: Suzzane Estes  Exercises - Supine Posterior Pelvic Tilt  - 1 x daily - 7 x weekly - 3 sets - 10 reps - 10 seconds hold - Supine March  - 1 x daily - 7 x weekly - 3 sets - 10 reps - Supine Knee and Hip Extension with Resistance  - 1 x daily - 7 x weekly - 3 sets - 10 reps  Blue band - Seated Cervical Retraction  - 3 x daily - 7 x weekly - 3 sets - 10 reps - 5 seconds hold   ASSESSMENT:  CLINICAL IMPRESSION:  Continued working on glute strengthening as well as maintaining a forward weight shift when stopping as well as in the static position to decrease unsteadiness due to tendency for loss of balance backwards. Improving ability to do so today observed compared to previous session during gait with stops. Pt tolerated session well without aggravation of symptoms. Pt will benefit from continued skilled physical therapy services to improve strength, balance, and function.     OBJECTIVE IMPAIRMENTS: Abnormal gait, difficulty walking, improper body mechanics, and postural dysfunction.   ACTIVITY LIMITATIONS: carrying, lifting, standing, squatting, stairs, transfers, and locomotion level  PARTICIPATION LIMITATIONS:   PERSONAL FACTORS: Age, Fitness, Past/current experiences, Profession, Time since onset of injury/illness/exacerbation, and 1-2 comorbidities: DM, neuropathy are also affecting patient's functional outcome.   REHAB  POTENTIAL: Fair    CLINICAL DECISION MAKING: Stable/uncomplicated, (balance has improved with B AFO per pt)  EVALUATION COMPLEXITY: Low   GOALS: Goals reviewed with patient? Yes  SHORT TERM GOALS: Target date: 06/22/2023  Pt will be independent with his initial HEP to improve strength, balance, function.  Baseline: Pt has not yet started his initial HEP (06/12/2023) Goal status: INITIAL   LONG TERM GOALS: Target date: 08/10/2023  Pt will improve his 5 times Sit <> stand time from chair with arms to 12 seconds or less without UE assist as a demonstration of improved functional LE strength.  Baseline: 31 seconds with B UE assist (06/12/2023) Goal status: INITIAL  2.  Pt will improve his Dynamic Gait Index Score to at least 19/24 or more as a demonstration of improved balance.  Baseline: DGI without AD: 12/24 (06/12/2023) Goal status: INITIAL  3.  Pt will improve his (Activities specific Balance Confidence) ABC scale score by at least 10% as a demonstration of improved balance confidence and decreased fall risk.   Baseline: ABC scale score to be obtained next session (06/12/2023); 82.5% (06/25/2023) Goal status: INITIAL  PLAN:  PT FREQUENCY: 1-2x/week  PT DURATION: 8 weeks  PLANNED INTERVENTIONS: [id number]-Therapeutic exercises, 97530- Therapeutic activity, [id number]- Neuromuscular re-education, 97535- Self Care, [id number]- Manual therapy, [phone]- Gait training, [phone]- Canalith repositioning, [id number]- Electrical stimulation (unattended), Patient/Family education, Balance training, and Stair training.  PLAN FOR NEXT SESSION: trunk and glute strengthening, balance training, manual techniques, modalities PRN   Truly Stankiewicz, PT, DPT 07/05/2023, 9:09 AM

## 2023-07-10 ENCOUNTER — Ambulatory Visit

## 2023-07-10 DIAGNOSIS — M6281 Muscle weakness (generalized): Secondary | ICD-10-CM | POA: Diagnosis not present

## 2023-07-10 DIAGNOSIS — G629 Polyneuropathy, unspecified: Secondary | ICD-10-CM | POA: Diagnosis not present

## 2023-07-10 DIAGNOSIS — R262 Difficulty in walking, not elsewhere classified: Secondary | ICD-10-CM | POA: Diagnosis not present

## 2023-07-10 DIAGNOSIS — R2681 Unsteadiness on feet: Secondary | ICD-10-CM

## 2023-07-10 DIAGNOSIS — G5603 Carpal tunnel syndrome, bilateral upper limbs: Secondary | ICD-10-CM | POA: Diagnosis not present

## 2023-07-10 DIAGNOSIS — Z9181 History of falling: Secondary | ICD-10-CM | POA: Diagnosis not present

## 2023-07-10 DIAGNOSIS — M51369 Other intervertebral disc degeneration, lumbar region without mention of lumbar back pain or lower extremity pain: Secondary | ICD-10-CM | POA: Diagnosis not present

## 2023-07-10 NOTE — Therapy (Signed)
 OUTPATIENT PHYSICAL THERAPY TREATMENT   Patient Name: Tyler Morgan MRN: [id number] DOB:10/21/1945, 78 y.o., male Today's Date: 07/10/2023  END OF SESSION:  PT End of Session - 07/10/23 0816     Visit Number 8    Number of Visits 17    Date for PT Re-Evaluation 08/10/23    PT Start Time 0816    PT Stop Time 0855    PT Time Calculation (min) 39 min    Activity Tolerance Patient tolerated treatment well    Behavior During Therapy [facility] for tasks assessed/performed                    Past Medical History:  Diagnosis Date   Diabetes mellitus without complication [facility])    ED (erectile dysfunction)    Hypertension    Motion sickness    boats   Neuropathy    Prostate enlargement    Urinary retention    Past Surgical History:  Procedure Laterality Date   BASAL CELL CARCINOMA EXCISION     nose    CATARACT EXTRACTION Bilateral 1986, 2014   COLONOSCOPY WITH PROPOFOL  N/A 03/27/2018   Procedure: COLONOSCOPY WITH BIOPSIES;  Surgeon: Selena Daily, MD;  Location: [facility];  Service: Endoscopy;  Laterality: N/A;  Diabetic - oral meds   EYE SURGERY Bilateral [phone]   FRACTURE SURGERY Right    as a child/ right arm   HERNIA REPAIR Right 01-13-14   inguinal hernia   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 N/A 02/07/2017   Procedure: 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;  Surgeon: Dustin Gimenez, MD;  Location: [facility];  Service: Urology;  Laterality: N/A;   POLYPECTOMY N/A 03/27/2018   Procedure: POLYPECTOMY;  Surgeon: Selena Daily, MD;  Location: [facility];  Service: Endoscopy;  Laterality: N/A;   RETINAL DETACHMENT SURGERY  Jan 2015   TONSILLECTOMY  1955   Patient Active Problem List   Diagnosis Date Noted   Acute non-recurrent maxillary sinusitis 06/21/2023   Diabetes mellitus treated with oral medication (HCC) 03/06/2023   Dysphagia 03/06/2023   PVD (peripheral vascular disease) (HCC)  11/30/2021   B12 deficiency 01/29/2019   History of prostate cancer 12/13/2017   Hemorrhoids 12/13/2017   Sensorimotor neuropathy 12/13/2017   Hyperlipidemia associated with type 2 diabetes mellitus (HCC) 04/02/2017   Advanced care planning/counseling discussion 10/11/2016   Type 2 diabetes mellitus with diabetic neuropathy (HCC) 12/15/2014   Hypertension associated with diabetes (HCC)     PCP: Lemar Pyles, NP   REFERRING PROVIDER: Baldemar Bond, FNP  REFERRING DIAG: R26.89 (ICD-10-CM) - Balance problem  Rationale for Evaluation and Treatment: Rehabilitation  THERAPY DIAG:  Unsteadiness on feet  Muscle weakness (generalized)  Difficulty in walking, not elsewhere classified  History of falling  ONSET DATE: 03/12/2023  (Date PT referral signed, chronic condition)  SUBJECTIVE:  SUBJECTIVE STATEMENT: Doing ok I think. Has been crazy at work (school). Balance feels pretty good at times. Yesterday morning, balance got a little haywire, got dizzy for about 20 seconds as he turned around while getting out of the shower and was fine afterwards. Has not happened since. Might have been probably when he bent down to dry his legs then standing back up. Did not fall. Does not remember the room looking like it was spinning. Did not fall because he had something to hold on to.   PERTINENT HISTORY:  Balance problem. Got AFOs B feet which helps with ankle DF and gait but limits his ankle movement. The stopping after walking with the AFO's a better but still staggers. Still can't stop and put a camera in front of his face and shoot a picture without wobbling. Also had some pain in his L hand (palmar side) and got a cortisone shot which helped his L hand feel better . The whole L side of his body  seems to give him the most problem which makes Dr. Bernetta Brilliant wonder about his diabetic neuropathy diagnosis. Balance might have gotten better since November from the AFOs  Blood pressure is controlled.  No latex allergies  From PT eval on 07/24/2022:   Balance, weakness. Can get around well without a cane. Standing still, pt loses balance. Has to hold onto things to stabilize himself. Has labradore thats about 78 year old and can walk his dog without a leash because the dog's pulling stabilizes him. Symptoms have been going on for a while and was told to have diabetic neuropathy. Had a fall in 2015 while working on the flooring in his den at home which caused R inguinal hernia S/P mesh placement. Pt states noticing problems in his legs since 2015. Pt also could not push up on his toes (tip toe) since then. No back pain currently but has had a hx of back pain over the years. Decreased sensation B feet, almost not noticeable. Feels like he has a lot of weakness which makes it difficult to keep his balance. Has B LE swelling, comes and goes, not currently getting treated for it. No low back pain, just L arm and shoulder.   PAIN:  Are you having pain? No  PRECAUTIONS: Fall  RED FLAGS: Bowel or bladder incontinence: No and Cauda equina syndrome: No   WEIGHT BEARING RESTRICTIONS: No  FALLS:  Has patient fallen in last 6 months? Yes. Number of falls 1 (pt might have slipped on ice or a wet grass)  LIVING ENVIRONMENT: Lives with: lives with their spouse Lives in: House/apartment Stairs: Yes: External: 1 steps; none Has following equipment at home: Single point cane and Grab bars  OCCUPATION: Conservation officer, historic buildings. Planning on retiring.   PLOF: Mod I.   PATIENT GOALS: Be able to stand still without wobbling.   NEXT MD VISIT: Yes with Dr. Carlis Cherry office Jul 10, 2023  OBJECTIVE:  Note: Objective measures were completed at Evaluation unless otherwise noted.  DIAGNOSTIC FINDINGS:  MR LUMBAR SPINE WO  CONTRAST  12/26/2016   Narrative & Impression  CLINICAL DATA:  Lower leg neuropathy for several years. No back pain, acute injury or prior relevant surgery.   EXAM: MRI LUMBAR SPINE WITHOUT CONTRAST   TECHNIQUE: Multiplanar, multisequence MR imaging of the lumbar spine was performed. No intravenous contrast was administered.   COMPARISON:  None.   FINDINGS: Segmentation: Conventional anatomy assumed, with the last open disc space designated L5-S1.   Alignment: There  is 11 mm of anterolisthesis at L5-S1 secondary to chronic bilateral L5 pars defects. The alignment is otherwise normal.   Vertebrae: As above, chronic bilateral L5 pars defects. There is associated endplate degeneration at L5-S1. No evidence of acute fracture or worrisome osseous lesion. The visualized sacroiliac joints appear unremarkable.   Conus medullaris: Extends to the T12-L1 level and appears normal.   Paraspinal and other soft tissues: No significant paraspinal findings.   Disc levels:   No significant disc space findings from T11-12 through L2-3.   L3-4: Mild loss of disc height with annular disc bulging and endplate osteophytes. Mild facet and ligamentous hypertrophy. There is borderline spinal stenosis and mild narrowing of the lateral recesses. The foramina are patent.   L4-5: Disc height and hydration are maintained. There is mild disc bulging, facet and ligamentous hypertrophy. No significant spinal stenosis or nerve root encroachment.   L5-S1: Bilateral L5 pars defects. There is advanced degenerative disc disease with loss of disc height, disc uncovering and endplate osteophytes. There is at least moderate foraminal narrowing bilaterally with probable encroachment on both exiting L5 nerve roots. The spinal canal and lateral recesses are widely patent.   IMPRESSION: 1. 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 There is probable chronic bilateral L5 nerve root encroachment. 2. Mild disc bulging at L3-4, contributing to mild narrowing of the lateral recesses, but no nerve root encroachment.     Electronically Signed   By: Elmon Hagedorn M.D.   On: 12/26/2016 16:46    PATIENT SURVEYS:    COGNITION: Overall cognitive status: Within functional limits for tasks assessed     SENSATION:   MUSCLE LENGTH:   POSTURE: R shoulder lower, upper thoracic flexion, slight R lateral shift, slight R thoracolumbar convexity,  B knee flexion,   PALPATION:   LUMBAR ROM:   AROM eval  Flexion   Extension   Right lateral flexion   Left lateral flexion   Right rotation   Left rotation    (Blank rows = not tested)  LOWER EXTREMITY ROM:     Active  Right eval Left eval  Hip flexion    Hip extension    Hip abduction    Hip adduction    Hip internal rotation    Hip external rotation    Knee flexion    Knee extension    Ankle dorsiflexion    Ankle plantarflexion    Ankle inversion    Ankle eversion     (Blank rows = not tested)  LOWER EXTREMITY MMT:    MMT Right eval Left eval  Hip flexion    Hip extension    Hip abduction    Hip adduction    Hip internal rotation    Hip external rotation    Knee flexion    Knee extension    Ankle dorsiflexion    Ankle plantarflexion    Ankle inversion    Ankle eversion     (Blank rows = not tested)  LUMBAR SPECIAL TESTS:    FUNCTIONAL TESTS:  DGI without AD: 12/24 5 times Sit <> stand: 31 seconds with B UE assist     B LE weakness felt.     TUG without AD: 13.81 seconds, 11.15 seconds, 10. 25 seconds        GAIT: Distance walked: 60 ft Assistive device utilized: None Level of assistance: SBA Comments: Decreased stance L LE, B pelvic drop  TREATMENT DATE:  07/10/2023                                                                                                                               Neuromuscular  re education  360 degree turns without UE assist  To the R 5x  To the L 5x   No reproduction of dizziness.   Standing lumbar flexion 10x without UE assist (pt light touch assist with L hand on treadmill bar 1x). Pt can bend knees. Cues for maintaining his center of gravity over his base of support.   No dizziness reported.   Backwards walking with CGA 6x  Cues for maintaining center of gravity over base of support and slight forward weight shift secondary to tendency for backwards weight shift.   Better able to maintain balance with R foot forward compared to L foot forward.   LOB 1x walking backwards when on R LE stance phase with pt turning his head.   Static standing with L foot in front  Eyes open, 30 seconds. Able to perform well  Eyes closed 1 min. x 3, multiple instances of L UE assist PRN.   Eyes closed feet shoulder width apart   Firm surface: 1 minute, light touch assist PRN  Eyes open feet shoulder width apart  Firm surface: 1 minute. Able to perform    feet shoulder width apart firm surface  L eye open to simulate taking a picture   R foot in front: 30 seconds. Able to perform   L foot in front 30 seconds. Unsteady but able to perform.    feet shoulder width apart, outside on mulch, decline surface  R foot in front 10 seconds x 4   Able to perform the first 3 sets. Increased unsteadiness during 4th repetition.    Improved exercise technique, movement at target joints, use of target muscles after mod verbal, visual, tactile cues.       PATIENT EDUCATION:  Education details: there-ex, HEP Person educated: Patient Education method: Explanation Education comprehension: verbalized understanding  HOME EXERCISE PROGRAM: Access Code: [email or web address] URL: [email or web address] Date: 06/14/2023 Prepared by: Suzzane Estes  Exercises - Supine Posterior Pelvic Tilt  - 1 x daily - 7 x weekly - 3 sets - 10 reps - 10 seconds hold - Supine March  - 1 x  daily - 7 x weekly - 3 sets - 10 reps - Supine Knee and Hip Extension with Resistance  - 1 x daily - 7 x weekly - 3 sets - 10 reps  Blue band - Seated Cervical Retraction  - 3 x daily - 7 x weekly - 3 sets - 10 reps - 5 seconds hold   ASSESSMENT:  CLINICAL IMPRESSION: No reproduction of dizziness with 360 degree turns both R and L directions, as well as standing lumbar flexion and returning to neutral. Continued working on static standing balance with eyes open, eyes closed, on firm and uneven/soft surfaces to promote ability  to stand still and take pictures. Multiple instances of LOB with L foot was in front. More steadiness comparatively speaking with R foot in front.  Pt tolerated session well without aggravation of symptoms. Pt will benefit from continued skilled physical therapy services to improve strength, balance, and function.     OBJECTIVE IMPAIRMENTS: Abnormal gait, difficulty walking, improper body mechanics, and postural dysfunction.   ACTIVITY LIMITATIONS: carrying, lifting, standing, squatting, stairs, transfers, and locomotion level  PARTICIPATION LIMITATIONS:   PERSONAL FACTORS: Age, Fitness, Past/current experiences, Profession, Time since onset of injury/illness/exacerbation, and 1-2 comorbidities: DM, neuropathy are also affecting patient's functional outcome.   REHAB POTENTIAL: Fair    CLINICAL DECISION MAKING: Stable/uncomplicated, (balance has improved with B AFO per pt)  EVALUATION COMPLEXITY: Low   GOALS: Goals reviewed with patient? Yes  SHORT TERM GOALS: Target date: 06/22/2023  Pt will be independent with his initial HEP to improve strength, balance, function.  Baseline: Pt has not yet started his initial HEP (06/12/2023) Goal status: INITIAL   LONG TERM GOALS: Target date: 08/10/2023  Pt will improve his 5 times Sit <> stand time from chair with arms to 12 seconds or less without UE assist as a demonstration of improved functional LE strength.   Baseline: 31 seconds with B UE assist (06/12/2023) Goal status: INITIAL  2.  Pt will improve his Dynamic Gait Index Score to at least 19/24 or more as a demonstration of improved balance.  Baseline: DGI without AD: 12/24 (06/12/2023) Goal status: INITIAL  3.  Pt will improve his (Activities specific Balance Confidence) ABC scale score by at least 10% as a demonstration of improved balance confidence and decreased fall risk.   Baseline: ABC scale score to be obtained next session (06/12/2023); 82.5% (06/25/2023) Goal status: INITIAL   PLAN:  PT FREQUENCY: 1-2x/week  PT DURATION: 8 weeks  PLANNED INTERVENTIONS: [id number]-Therapeutic exercises, 97530- Therapeutic activity, 97112- Neuromuscular re-education, 97535- Self Care, [id number]- Manual therapy, [id number]- Gait training, [phone]- Canalith repositioning, [id number]- Electrical stimulation (unattended), Patient/Family education, Balance training, and Stair training.  PLAN FOR NEXT SESSION: trunk and glute strengthening, balance training, manual techniques, modalities PRN   Micheil Klaus, PT, DPT 07/10/2023, 9:00 AM

## 2023-07-12 ENCOUNTER — Ambulatory Visit

## 2023-07-12 DIAGNOSIS — R262 Difficulty in walking, not elsewhere classified: Secondary | ICD-10-CM | POA: Diagnosis not present

## 2023-07-12 DIAGNOSIS — Z9181 History of falling: Secondary | ICD-10-CM | POA: Diagnosis not present

## 2023-07-12 DIAGNOSIS — M6281 Muscle weakness (generalized): Secondary | ICD-10-CM | POA: Diagnosis not present

## 2023-07-12 DIAGNOSIS — R2681 Unsteadiness on feet: Secondary | ICD-10-CM | POA: Diagnosis not present

## 2023-07-12 NOTE — Therapy (Signed)
 OUTPATIENT PHYSICAL THERAPY TREATMENT   Patient Name: Tyler Morgan MRN: [id number] DOB:1945/05/07, 78 y.o., male Today's Date: 07/12/2023  END OF SESSION:  PT End of Session - 07/12/23 0819     Visit Number 9    Number of Visits 17    Date for PT Re-Evaluation 08/10/23    PT Start Time 0819    PT Stop Time 0859    PT Time Calculation (min) 40 min    Activity Tolerance Patient tolerated treatment well    Behavior During Therapy [facility] for tasks assessed/performed                     Past Medical History:  Diagnosis Date   Diabetes mellitus without complication [facility])    ED (erectile dysfunction)    Hypertension    Motion sickness    boats   Neuropathy    Prostate enlargement    Urinary retention    Past Surgical History:  Procedure Laterality Date   BASAL CELL CARCINOMA EXCISION     nose    CATARACT EXTRACTION Bilateral 1986, 2014   COLONOSCOPY WITH PROPOFOL  N/A 03/27/2018   Procedure: COLONOSCOPY WITH BIOPSIES;  Surgeon: Selena Daily, MD;  Location: [facility];  Service: Endoscopy;  Laterality: N/A;  Diabetic - oral meds   EYE SURGERY Bilateral [phone]   FRACTURE SURGERY Right    as a child/ right arm   HERNIA REPAIR Right 01-13-14   inguinal hernia   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 N/A 02/07/2017   Procedure: 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;  Surgeon: Dustin Gimenez, MD;  Location: [facility];  Service: Urology;  Laterality: N/A;   POLYPECTOMY N/A 03/27/2018   Procedure: POLYPECTOMY;  Surgeon: Selena Daily, MD;  Location: [facility];  Service: Endoscopy;  Laterality: N/A;   RETINAL DETACHMENT SURGERY  Jan 2015   TONSILLECTOMY  1955   Patient Active Problem List   Diagnosis Date Noted   Acute non-recurrent maxillary sinusitis 06/21/2023   Diabetes mellitus treated with oral medication (HCC) 03/06/2023   Dysphagia 03/06/2023   PVD (peripheral vascular disease) (HCC)  11/30/2021   B12 deficiency 01/29/2019   History of prostate cancer 12/13/2017   Hemorrhoids 12/13/2017   Sensorimotor neuropathy 12/13/2017   Hyperlipidemia associated with type 2 diabetes mellitus (HCC) 04/02/2017   Advanced care planning/counseling discussion 10/11/2016   Type 2 diabetes mellitus with diabetic neuropathy (HCC) 12/15/2014   Hypertension associated with diabetes (HCC)     PCP: Lemar Pyles, NP   REFERRING PROVIDER: Baldemar Bond, FNP  REFERRING DIAG: R26.89 (ICD-10-CM) - Balance problem  Rationale for Evaluation and Treatment: Rehabilitation  THERAPY DIAG:  Unsteadiness on feet  Muscle weakness (generalized)  Difficulty in walking, not elsewhere classified  History of falling  ONSET DATE: 03/12/2023  (Date PT referral signed, chronic condition)  SUBJECTIVE:  SUBJECTIVE STATEMENT: Doing ok I think. No dizziness. Has a corn on his L 5th digit which bother him at times.     PERTINENT HISTORY:  Balance problem. Got AFOs B feet which helps with ankle DF and gait but limits his ankle movement. The stopping after walking with the AFO's a better but still staggers. Still can't stop and put a camera in front of his face and shoot a picture without wobbling. Also had some pain in his L hand (palmar side) and got a cortisone shot which helped his L hand feel better . The whole L side of his body seems to give him the most problem which makes Dr. Bernetta Brilliant wonder about his diabetic neuropathy diagnosis. Balance might have gotten better since November from the AFOs  Blood pressure is controlled.  No latex allergies  From PT eval on 07/24/2022:   Balance, weakness. Can get around well without a cane. Standing still, pt loses balance. Has to hold onto things to stabilize  himself. Has labradore thats about 78 year old and can walk his dog without a leash because the dog's pulling stabilizes him. Symptoms have been going on for a while and was told to have diabetic neuropathy. Had a fall in 2015 while working on the flooring in his den at home which caused R inguinal hernia S/P mesh placement. Pt states noticing problems in his legs since 2015. Pt also could not push up on his toes (tip toe) since then. No back pain currently but has had a hx of back pain over the years. Decreased sensation B feet, almost not noticeable. Feels like he has a lot of weakness which makes it difficult to keep his balance. Has B LE swelling, comes and goes, not currently getting treated for it. No low back pain, just L arm and shoulder.   PAIN:  Are you having pain? No  PRECAUTIONS: Fall  RED FLAGS: Bowel or bladder incontinence: No and Cauda equina syndrome: No   WEIGHT BEARING RESTRICTIONS: No  FALLS:  Has patient fallen in last 6 months? Yes. Number of falls 1 (pt might have slipped on ice or a wet grass)  LIVING ENVIRONMENT: Lives with: lives with their spouse Lives in: House/apartment Stairs: Yes: External: 1 steps; none Has following equipment at home: Single point cane and Grab bars  OCCUPATION: Conservation officer, historic buildings. Planning on retiring.   PLOF: Mod I.   PATIENT GOALS: Be able to stand still without wobbling.   NEXT MD VISIT: Yes with Dr. Carlis Cherry office Jul 10, 2023  OBJECTIVE:  Note: Objective measures were completed at Evaluation unless otherwise noted.  DIAGNOSTIC FINDINGS:  MR LUMBAR SPINE WO CONTRAST  12/26/2016   Narrative & Impression  CLINICAL DATA:  Lower leg neuropathy for several years. No back pain, acute injury or prior relevant surgery.   EXAM: MRI LUMBAR SPINE WITHOUT CONTRAST   TECHNIQUE: Multiplanar, multisequence MR imaging of the lumbar spine was performed. No intravenous contrast was administered.   COMPARISON:  None.    FINDINGS: Segmentation: Conventional anatomy assumed, with the last open disc space designated L5-S1.   Alignment: There is 11 mm of anterolisthesis at L5-S1 secondary to chronic bilateral L5 pars defects. The alignment is otherwise normal.   Vertebrae: As above, chronic bilateral L5 pars defects. There is associated endplate degeneration at L5-S1. No evidence of acute fracture or worrisome osseous lesion. The visualized sacroiliac joints appear unremarkable.   Conus medullaris: Extends to the T12-L1 level and appears normal.   Paraspinal  and other soft tissues: No significant paraspinal findings.   Disc levels:   No significant disc space findings from T11-12 through L2-3.   L3-4: Mild loss of disc height with annular disc bulging and endplate osteophytes. Mild facet and ligamentous hypertrophy. There is borderline spinal stenosis and mild narrowing of the lateral recesses. The foramina are patent.   L4-5: Disc height and hydration are maintained. There is mild disc bulging, facet and ligamentous hypertrophy. No significant spinal stenosis or nerve root encroachment.   L5-S1: Bilateral L5 pars defects. There is advanced degenerative disc disease with loss of disc height, disc uncovering and endplate osteophytes. There is at least moderate foraminal narrowing bilaterally with probable encroachment on both exiting L5 nerve roots. The spinal canal and lateral recesses are widely patent.   IMPRESSION: 1. 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 There is probable chronic bilateral L5 nerve root encroachment. 2. Mild disc bulging at L3-4, contributing to mild narrowing of the lateral recesses, but no nerve root encroachment.     Electronically Signed   By: Elmon Hagedorn M.D.   On: 12/26/2016 16:46    PATIENT SURVEYS:    COGNITION: Overall cognitive status: Within  functional limits for tasks assessed     SENSATION:   MUSCLE LENGTH:   POSTURE: R shoulder lower, upper thoracic flexion, slight R lateral shift, slight R thoracolumbar convexity,  B knee flexion,   PALPATION:   LUMBAR ROM:   AROM eval  Flexion   Extension   Right lateral flexion   Left lateral flexion   Right rotation   Left rotation    (Blank rows = not tested)  LOWER EXTREMITY ROM:     Active  Right eval Left eval  Hip flexion    Hip extension    Hip abduction    Hip adduction    Hip internal rotation    Hip external rotation    Knee flexion    Knee extension    Ankle dorsiflexion    Ankle plantarflexion    Ankle inversion    Ankle eversion     (Blank rows = not tested)  LOWER EXTREMITY MMT:    MMT Right eval Left eval  Hip flexion    Hip extension    Hip abduction    Hip adduction    Hip internal rotation    Hip external rotation    Knee flexion    Knee extension    Ankle dorsiflexion    Ankle plantarflexion    Ankle inversion    Ankle eversion     (Blank rows = not tested)  LUMBAR SPECIAL TESTS:    FUNCTIONAL TESTS:  DGI without AD: 12/24 5 times Sit <> stand: 31 seconds with B UE assist     B LE weakness felt.     TUG without AD: 13.81 seconds, 11.15 seconds, 10. 25 seconds        GAIT: Distance walked: 60 ft Assistive device utilized: None Level of assistance: SBA Comments: Decreased stance L LE, B pelvic drop  TREATMENT DATE: 07/12/2023  Neuromuscular re education  Static standing with L foot in front  Eyes open, 30 seconds. Able to perform well  Eyes closed 30 seconds then 1 min. x 3, multiple instances of L UE assist PRN.   Backwards walking with CGA 6x 30 ft  Cues for maintaining center of gravity over base of support and slight forward weight shift secondary to tendency for backwards weight  shift.   Unsteady on L LE today observed.    Standing on firm surface  L eye open to simulate taking a picture   Feet shoulder width apart, 1 minute pt only used light touch assist 2x    R foot in front: 1 minute. Able to perform without UE assist    L foot in front 1 minute. Only used light touch assist 1x  Standing on, Air Ex pad  L eye open to simulate  taking a picutre   Feet shoulder width apart 1 minute, used light touch assist 3x    R foot in front: 1 minute. Light touch assist 6x    L foot in front 1 minute. Only used light touch assist 1x    Hip and knee balance strategy used  Gait forward with multiple sudden stops 25 ft x 4  Improved stop balance with forward weight shifting and feet apart.   Side ways walk 25 ft with multiple sudden stops 3x R and L   Improved exercise technique, movement at target joints, use of target muscles after mod verbal, visual, tactile cues.       PATIENT EDUCATION:  Education details: there-ex, HEP Person educated: Patient Education method: Explanation Education comprehension: verbalized understanding  HOME EXERCISE PROGRAM: Access Code: [email or web address] URL: [email or web address] Date: 06/14/2023 Prepared by: Suzzane Estes  Exercises - Supine Posterior Pelvic Tilt  - 1 x daily - 7 x weekly - 3 sets - 10 reps - 10 seconds hold - Supine March  - 1 x daily - 7 x weekly - 3 sets - 10 reps - Supine Knee and Hip Extension with Resistance  - 1 x daily - 7 x weekly - 3 sets - 10 reps  Blue band - Seated Cervical Retraction  - 3 x daily - 7 x weekly - 3 sets - 10 reps - 5 seconds hold   ASSESSMENT:  CLINICAL IMPRESSION: Improving overall ability to perform sudden stops with more stability, with pt being able to maintain enough forward weight shifting to not lose balance backwards on firm surface. Has difficulty maintaining static balance with decreased vision use and when standing on uneven surface.  Pt tolerated session well  without aggravation of symptoms. Pt will benefit from continued skilled physical therapy services to improve strength, balance, and function.     OBJECTIVE IMPAIRMENTS: Abnormal gait, difficulty walking, improper body mechanics, and postural dysfunction.   ACTIVITY LIMITATIONS: carrying, lifting, standing, squatting, stairs, transfers, and locomotion level  PARTICIPATION LIMITATIONS:   PERSONAL FACTORS: Age, Fitness, Past/current experiences, Profession, Time since onset of injury/illness/exacerbation, and 1-2 comorbidities: DM, neuropathy are also affecting patient's functional outcome.   REHAB POTENTIAL: Fair    CLINICAL DECISION MAKING: Stable/uncomplicated, (balance has improved with B AFO per pt)  EVALUATION COMPLEXITY: Low   GOALS: Goals reviewed with patient? Yes  SHORT TERM GOALS: Target date: 06/22/2023  Pt will be independent with his initial HEP to improve strength, balance, function.  Baseline: Pt has not yet started his initial HEP (06/12/2023) Goal status: INITIAL   LONG TERM GOALS: Target date: 08/10/2023  Pt  will improve his 5 times Sit <> stand time from chair with arms to 12 seconds or less without UE assist as a demonstration of improved functional LE strength.  Baseline: 31 seconds with B UE assist (06/12/2023) Goal status: INITIAL  2.  Pt will improve his Dynamic Gait Index Score to at least 19/24 or more as a demonstration of improved balance.  Baseline: DGI without AD: 12/24 (06/12/2023) Goal status: INITIAL  3.  Pt will improve his (Activities specific Balance Confidence) ABC scale score by at least 10% as a demonstration of improved balance confidence and decreased fall risk.   Baseline: ABC scale score to be obtained next session (06/12/2023); 82.5% (06/25/2023) Goal status: INITIAL   PLAN:  PT FREQUENCY: 1-2x/week  PT DURATION: 8 weeks  PLANNED INTERVENTIONS: 97110-Therapeutic exercises, 97530- Therapeutic activity, 97112- Neuromuscular  re-education, 97535- Self Care, [id number]- Manual therapy, [id number]- Gait training, [phone]- Canalith repositioning, [id number]- Electrical stimulation (unattended), Patient/Family education, Balance training, and Stair training.  PLAN FOR NEXT SESSION: trunk and glute strengthening, balance training, manual techniques, modalities PRN   Tien Aispuro, PT, DPT 07/12/2023, 9:07 AM

## 2023-07-13 ENCOUNTER — Other Ambulatory Visit: Payer: Self-pay

## 2023-07-13 DIAGNOSIS — N401 Enlarged prostate with lower urinary tract symptoms: Secondary | ICD-10-CM

## 2023-07-13 DIAGNOSIS — C61 Malignant neoplasm of prostate: Secondary | ICD-10-CM

## 2023-07-17 ENCOUNTER — Other Ambulatory Visit: Payer: Self-pay

## 2023-07-17 DIAGNOSIS — N401 Enlarged prostate with lower urinary tract symptoms: Secondary | ICD-10-CM

## 2023-07-17 DIAGNOSIS — C61 Malignant neoplasm of prostate: Secondary | ICD-10-CM

## 2023-07-17 DIAGNOSIS — R350 Frequency of micturition: Secondary | ICD-10-CM | POA: Diagnosis not present

## 2023-07-18 ENCOUNTER — Ambulatory Visit

## 2023-07-18 DIAGNOSIS — R262 Difficulty in walking, not elsewhere classified: Secondary | ICD-10-CM

## 2023-07-18 DIAGNOSIS — R2681 Unsteadiness on feet: Secondary | ICD-10-CM

## 2023-07-18 DIAGNOSIS — Z9181 History of falling: Secondary | ICD-10-CM | POA: Diagnosis not present

## 2023-07-18 DIAGNOSIS — M6281 Muscle weakness (generalized): Secondary | ICD-10-CM

## 2023-07-18 LAB — PSA: Prostate Specific Ag, Serum: 2.8 ng/mL (ref 0.0–4.0)

## 2023-07-18 NOTE — Therapy (Signed)
 OUTPATIENT PHYSICAL THERAPY TREATMENT And Progress Report (06/12/2023 - 07/18/2023)   Patient Name: Tyler Morgan MRN: [id number] DOB:07/08/1945, 78 y.o., male Today's Date: 07/18/2023  END OF SESSION:  PT End of Session - 07/18/23 0819     Visit Number 10    Number of Visits 17    Date for PT Re-Evaluation 08/10/23    PT Start Time 0819    PT Stop Time 0859    PT Time Calculation (min) 40 min    Activity Tolerance Patient tolerated treatment well    Behavior During Therapy [facility] for tasks assessed/performed                      Past Medical History:  Diagnosis Date   Diabetes mellitus without complication [facility])    ED (erectile dysfunction)    Hypertension    Motion sickness    boats   Neuropathy    Prostate enlargement    Urinary retention    Past Surgical History:  Procedure Laterality Date   BASAL CELL CARCINOMA EXCISION     nose    CATARACT EXTRACTION Bilateral 1986, 2014   COLONOSCOPY WITH PROPOFOL  N/A 03/27/2018   Procedure: COLONOSCOPY WITH BIOPSIES;  Surgeon: Selena Daily, MD;  Location: [facility];  Service: Endoscopy;  Laterality: N/A;  Diabetic - oral meds   EYE SURGERY Bilateral [phone]   FRACTURE SURGERY Right    as a child/ right arm   HERNIA REPAIR Right 01-13-14   inguinal hernia   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 N/A 02/07/2017   Procedure: 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;  Surgeon: Dustin Gimenez, MD;  Location: [facility];  Service: Urology;  Laterality: N/A;   POLYPECTOMY N/A 03/27/2018   Procedure: POLYPECTOMY;  Surgeon: Selena Daily, MD;  Location: [facility];  Service: Endoscopy;  Laterality: N/A;   RETINAL DETACHMENT SURGERY  Jan 2015   TONSILLECTOMY  1955   Patient Active Problem List   Diagnosis Date Noted   Acute non-recurrent maxillary sinusitis 06/21/2023   Diabetes mellitus treated with oral medication (HCC) 03/06/2023   Dysphagia  03/06/2023   PVD (peripheral vascular disease) (HCC) 11/30/2021   B12 deficiency 01/29/2019   History of prostate cancer 12/13/2017   Hemorrhoids 12/13/2017   Sensorimotor neuropathy 12/13/2017   Hyperlipidemia associated with type 2 diabetes mellitus (HCC) 04/02/2017   Advanced care planning/counseling discussion 10/11/2016   Type 2 diabetes mellitus with diabetic neuropathy (HCC) 12/15/2014   Hypertension associated with diabetes (HCC)     PCP: Lemar Pyles, NP   REFERRING PROVIDER: Baldemar Bond, FNP  REFERRING DIAG: R26.89 (ICD-10-CM) - Balance problem  Rationale for Evaluation and Treatment: Rehabilitation  THERAPY DIAG:  Unsteadiness on feet  Muscle weakness (generalized)  Difficulty in walking, not elsewhere classified  History of falling  ONSET DATE: 03/12/2023  (Date PT referral signed, chronic condition)  SUBJECTIVE:  SUBJECTIVE STATEMENT: Everything seems to be feeling better in here. Hands and legs don't seem to be bothering him currently. Toe has not been bothering him as much.     PERTINENT HISTORY:  Balance problem. Got AFOs B feet which helps with ankle DF and gait but limits his ankle movement. The stopping after walking with the AFO's a better but still staggers. Still can't stop and put a camera in front of his face and shoot a picture without wobbling. Also had some pain in his L hand (palmar side) and got a cortisone shot which helped his L hand feel better . The whole L side of his body seems to give him the most problem which makes Dr. Bernetta Brilliant wonder about his diabetic neuropathy diagnosis. Balance might have gotten better since November from the AFOs  Blood pressure is controlled.  No latex allergies  From PT eval on 07/24/2022:   Balance, weakness. Can  get around well without a cane. Standing still, pt loses balance. Has to hold onto things to stabilize himself. Has labradore thats about 78 year old and can walk his dog without a leash because the dog's pulling stabilizes him. Symptoms have been going on for a while and was told to have diabetic neuropathy. Had a fall in 2015 while working on the flooring in his den at home which caused R inguinal hernia S/P mesh placement. Pt states noticing problems in his legs since 2015. Pt also could not push up on his toes (tip toe) since then. No back pain currently but has had a hx of back pain over the years. Decreased sensation B feet, almost not noticeable. Feels like he has a lot of weakness which makes it difficult to keep his balance. Has B LE swelling, comes and goes, not currently getting treated for it. No low back pain, just L arm and shoulder.   PAIN:  Are you having pain? No  PRECAUTIONS: Fall  RED FLAGS: Bowel or bladder incontinence: No and Cauda equina syndrome: No   WEIGHT BEARING RESTRICTIONS: No  FALLS:  Has patient fallen in last 6 months? Yes. Number of falls 1 (pt might have slipped on ice or a wet grass)  LIVING ENVIRONMENT: Lives with: lives with their spouse Lives in: House/apartment Stairs: Yes: External: 1 steps; none Has following equipment at home: Single point cane and Grab bars  OCCUPATION: Conservation officer, historic buildings. Planning on retiring.   PLOF: Mod I.   PATIENT GOALS: Be able to stand still without wobbling.   NEXT MD VISIT: Yes with Dr. Carlis Cherry office Jul 10, 2023  OBJECTIVE:  Note: Objective measures were completed at Evaluation unless otherwise noted.  DIAGNOSTIC FINDINGS:  MR LUMBAR SPINE WO CONTRAST  12/26/2016   Narrative & Impression  CLINICAL DATA:  Lower leg neuropathy for several years. No back pain, acute injury or prior relevant surgery.   EXAM: MRI LUMBAR SPINE WITHOUT CONTRAST   TECHNIQUE: Multiplanar, multisequence MR imaging of the lumbar  spine was performed. No intravenous contrast was administered.   COMPARISON:  None.   FINDINGS: Segmentation: Conventional anatomy assumed, with the last open disc space designated L5-S1.   Alignment: There is 11 mm of anterolisthesis at L5-S1 secondary to chronic bilateral L5 pars defects. The alignment is otherwise normal.   Vertebrae: As above, chronic bilateral L5 pars defects. There is associated endplate degeneration at L5-S1. No evidence of acute fracture or worrisome osseous lesion. The visualized sacroiliac joints appear unremarkable.   Conus medullaris: Extends to the T12-L1  level and appears normal.   Paraspinal and other soft tissues: No significant paraspinal findings.   Disc levels:   No significant disc space findings from T11-12 through L2-3.   L3-4: Mild loss of disc height with annular disc bulging and endplate osteophytes. Mild facet and ligamentous hypertrophy. There is borderline spinal stenosis and mild narrowing of the lateral recesses. The foramina are patent.   L4-5: Disc height and hydration are maintained. There is mild disc bulging, facet and ligamentous hypertrophy. No significant spinal stenosis or nerve root encroachment.   L5-S1: Bilateral L5 pars defects. There is advanced degenerative disc disease with loss of disc height, disc uncovering and endplate osteophytes. There is at least moderate foraminal narrowing bilaterally with probable encroachment on both exiting L5 nerve roots. The spinal canal and lateral recesses are widely patent.   IMPRESSION: 1. 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 There is probable chronic bilateral L5 nerve root encroachment. 2. Mild disc bulging at L3-4, contributing to mild narrowing of the lateral recesses, but no nerve root encroachment.     Electronically Signed   By: Elmon Hagedorn M.D.   On:  12/26/2016 16:46    PATIENT SURVEYS:    COGNITION: Overall cognitive status: Within functional limits for tasks assessed     SENSATION:   MUSCLE LENGTH:   POSTURE: R shoulder lower, upper thoracic flexion, slight R lateral shift, slight R thoracolumbar convexity,  B knee flexion,   PALPATION:   LUMBAR ROM:   AROM eval  Flexion   Extension   Right lateral flexion   Left lateral flexion   Right rotation   Left rotation    (Blank rows = not tested)  LOWER EXTREMITY ROM:     Active  Right eval Left eval  Hip flexion    Hip extension    Hip abduction    Hip adduction    Hip internal rotation    Hip external rotation    Knee flexion    Knee extension    Ankle dorsiflexion    Ankle plantarflexion    Ankle inversion    Ankle eversion     (Blank rows = not tested)  LOWER EXTREMITY MMT:    MMT Right eval Left eval  Hip flexion    Hip extension    Hip abduction    Hip adduction    Hip internal rotation    Hip external rotation    Knee flexion    Knee extension    Ankle dorsiflexion    Ankle plantarflexion    Ankle inversion    Ankle eversion     (Blank rows = not tested)  LUMBAR SPECIAL TESTS:    FUNCTIONAL TESTS:  DGI without AD: 12/24 5 times Sit <> stand: 31 seconds with B UE assist     B LE weakness felt.     TUG without AD: 13.81 seconds, 11.15 seconds, 10. 25 seconds        GAIT: Distance walked: 60 ft Assistive device utilized: None Level of assistance: SBA Comments: Decreased stance L LE, B pelvic drop  TREATMENT DATE: 07/18/2023  Neuromuscular re education  Sit <> stand 5x without UE assist  then 4x quickly without UE assist: unsteady Then 5x quickly with B UE assist: 15.66 seconds   Directed patient with gait with normal gait speed, with changes in speed, 180 degree pivot turn, with R and L cervical  rotation position, with cervical flexion and extension position, stepping around obstacles, stepping over an obstacle, ascending and descending 4 regular steps with UE assist  16/24 (main difficulty is static standing balance)  Reviewed progress/current status with PT towards goals.    Static standing on firm surface balance feet shoulder width apart with eyes closed 1 min x 2  Then with feet together 1 min (able to maitnain balance 25 seconds x 2 without UE assist)  Backwards walking with CGA 6x 30 ft  Cues for maintaining center of gravity over base of support and slight forward weight shift secondary to tendency for backwards weight shift.      Gait forward with multiple sudden stops 225 ft   Improved stopping/static balance with forward weight shifting and feet apart.       Improved exercise technique, movement at target joints, use of target muscles after mod verbal, visual, tactile cues.       PATIENT EDUCATION:  Education details: there-ex, HEP Person educated: Patient Education method: Explanation Education comprehension: verbalized understanding  HOME EXERCISE PROGRAM: Access Code: [email or web address] URL: [email or web address] Date: 06/14/2023 Prepared by: Suzzane Estes  Exercises - Supine Posterior Pelvic Tilt  - 1 x daily - 7 x weekly - 3 sets - 10 reps - 10 seconds hold - Supine March  - 1 x daily - 7 x weekly - 3 sets - 10 reps - Supine Knee and Hip Extension with Resistance  - 1 x daily - 7 x weekly - 3 sets - 10 reps  Blue band - Seated Cervical Retraction  - 3 x daily - 7 x weekly - 3 sets - 10 reps - 5 seconds hold   ASSESSMENT:  CLINICAL IMPRESSION: Pt demonstrates improved LE functional strength based on imprved 5 times sit <> stand time, as well as slight improved balance based on his current DGI score since initial evaluation. Main challenge currently is maintainding balance when standing still Balance improves in static standing when cued to  maintain his center of gravity over his base of support/gentle forward weight shifting to counter backwards lean. Pt overall making some progress with PT towards goals.  Pt tolerated session well without aggravation of symptoms. Pt will benefit from continued skilled physical therapy services to improve strength, balance, and function.     OBJECTIVE IMPAIRMENTS: Abnormal gait, difficulty walking, improper body mechanics, and postural dysfunction.   ACTIVITY LIMITATIONS: carrying, lifting, standing, squatting, stairs, transfers, and locomotion level  PARTICIPATION LIMITATIONS:   PERSONAL FACTORS: Age, Fitness, Past/current experiences, Profession, Time since onset of injury/illness/exacerbation, and 1-2 comorbidities: DM, neuropathy are also affecting patient's functional outcome.   REHAB POTENTIAL: Fair    CLINICAL DECISION MAKING: Stable/uncomplicated, (balance has improved with B AFO per pt)  EVALUATION COMPLEXITY: Low   GOALS: Goals reviewed with patient? Yes  SHORT TERM GOALS: Target date: 06/22/2023  Pt will be independent with his initial HEP to improve strength, balance, function.  Baseline: Pt has not yet started his initial HEP (06/12/2023); No questions (07/18/2023) Goal status: MET   LONG TERM GOALS: Target date: 08/10/2023  Pt will improve his 5 times Sit <> stand time from chair with arms to 12 seconds or less without  UE assist as a demonstration of improved functional LE strength.  Baseline: 31 seconds with B UE assist (06/12/2023); 15.66 seconds with B UE assist (07/18/2023) Goal status: PROGRESSING  2.  Pt will improve his Dynamic Gait Index Score to at least 19/24 or more as a demonstration of improved balance.  Baseline: DGI without AD: 12/24 (06/12/2023); 16/24 (07/18/2023) Goal status: PROGRESSING  3.  Pt will improve his (Activities specific Balance Confidence) ABC scale score by at least 10% as a demonstration of improved balance confidence and decreased fall risk.    Baseline: ABC scale score to be obtained next session (06/12/2023); 82.5% (06/25/2023); 85.6% (07/18/2023) Goal status: ONGOING   PLAN:  PT FREQUENCY: 1-2x/week  PT DURATION: 8 weeks  PLANNED INTERVENTIONS: 97110-Therapeutic exercises, 97530- Therapeutic activity, 97112- Neuromuscular re-education, 97535- Self Care, [id number]- Manual therapy, [id number]- Gait training, [phone]- Canalith repositioning, [id number]- Electrical stimulation (unattended), Patient/Family education, Balance training, and Stair training.  PLAN FOR NEXT SESSION: trunk and glute strengthening, balance training, manual techniques, modalities PRN  Thank you for your referral.  Deasiah Hagberg, PT, DPT 07/18/2023, 10:17 AM

## 2023-07-20 ENCOUNTER — Ambulatory Visit

## 2023-07-20 DIAGNOSIS — R262 Difficulty in walking, not elsewhere classified: Secondary | ICD-10-CM | POA: Diagnosis not present

## 2023-07-20 DIAGNOSIS — M6281 Muscle weakness (generalized): Secondary | ICD-10-CM | POA: Diagnosis not present

## 2023-07-20 DIAGNOSIS — R2681 Unsteadiness on feet: Secondary | ICD-10-CM | POA: Diagnosis not present

## 2023-07-20 DIAGNOSIS — Z9181 History of falling: Secondary | ICD-10-CM | POA: Diagnosis not present

## 2023-07-20 NOTE — Therapy (Signed)
 OUTPATIENT PHYSICAL THERAPY TREATMENT   Patient Name: Tyler Morgan MRN: [id number] DOB:1945-06-26, 78 y.o., male Today's Date: 07/20/2023  END OF SESSION:  PT End of Session - 07/20/23 0818     Visit Number 11    Number of Visits 17    Date for PT Re-Evaluation 08/10/23    Authorization Type [employer]    PT Start Time 0815    PT Stop Time 0855    PT Time Calculation (min) 40 min    Equipment Utilized During Treatment Gait belt    Activity Tolerance Patient tolerated treatment well    Behavior During Therapy WFL for tasks assessed/performed             Past Medical History:  Diagnosis Date   Diabetes mellitus without complication (HCC)    ED (erectile dysfunction)    Hypertension    Motion sickness    boats   Neuropathy    Prostate enlargement    Urinary retention    Past Surgical History:  Procedure Laterality Date   BASAL CELL CARCINOMA EXCISION     nose    CATARACT EXTRACTION Bilateral 1986, 2014   COLONOSCOPY WITH PROPOFOL  N/A 03/27/2018   Procedure: COLONOSCOPY WITH BIOPSIES;  Surgeon: Selena Daily, MD;  Location: [facility];  Service: Endoscopy;  Laterality: N/A;  Diabetic - oral meds   EYE SURGERY Bilateral [phone]   FRACTURE SURGERY Right    as a child/ right arm   HERNIA REPAIR Right 01-13-14   inguinal hernia   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 N/A 02/07/2017   Procedure: 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;  Surgeon: Dustin Gimenez, MD;  Location: [facility];  Service: Urology;  Laterality: N/A;   POLYPECTOMY N/A 03/27/2018   Procedure: POLYPECTOMY;  Surgeon: Selena Daily, MD;  Location: [facility];  Service: Endoscopy;  Laterality: N/A;   RETINAL DETACHMENT SURGERY  Jan 2015   TONSILLECTOMY  1955   Patient Active Problem List   Diagnosis Date Noted   Acute non-recurrent maxillary sinusitis 06/21/2023   Diabetes mellitus treated with oral medication  (HCC) 03/06/2023   Dysphagia 03/06/2023   PVD (peripheral vascular disease) (HCC) 11/30/2021   B12 deficiency 01/29/2019   History of prostate cancer 12/13/2017   Hemorrhoids 12/13/2017   Sensorimotor neuropathy 12/13/2017   Hyperlipidemia associated with type 2 diabetes mellitus (HCC) 04/02/2017   Advanced care planning/counseling discussion 10/11/2016   Type 2 diabetes mellitus with diabetic neuropathy (HCC) 12/15/2014   Hypertension associated with diabetes (HCC)     PCP: Lemar Pyles, NP   REFERRING PROVIDER: Baldemar Bond, FNP  REFERRING DIAG: R26.89 (ICD-10-CM) - Balance problem  Rationale for Evaluation and Treatment: Rehabilitation  THERAPY DIAG:  Unsteadiness on feet  Muscle weakness (generalized)  Difficulty in walking, not elsewhere classified  History of falling  ONSET DATE: 03/12/2023  (Date PT referral signed, chronic condition)  SUBJECTIVE:  SUBJECTIVE STATEMENT: Everything going generally well. No updates since last session.   PERTINENT HISTORY:  Balance problem. Got AFOs B feet which helps with ankle DF and gait but limits his ankle movement. The stopping after walking with the AFO's a better but still staggers. Still can't stop and put a camera in front of his face and shoot a picture without wobbling. Also had some pain in his L hand (palmar side) and got a cortisone shot which helped his L hand feel better . The whole L side of his body seems to give him the most problem which makes Dr. Bernetta Brilliant wonder about his diabetic neuropathy diagnosis. Balance might have gotten better since November from the AFOs  Blood pressure is controlled.  No latex allergies  From PT eval on 07/24/2022:   Balance, weakness. Can get around well without a cane. Standing still, pt  loses balance. Has to hold onto things to stabilize himself. Has labradore thats about 78 year old and can walk his dog without a leash because the dog's pulling stabilizes him. Symptoms have been going on for a while and was told to have diabetic neuropathy. Had a fall in 2015 while working on the flooring in his den at home which caused R inguinal hernia S/P mesh placement. Pt states noticing problems in his legs since 2015. Pt also could not push up on his toes (tip toe) since then. No back pain currently but has had a hx of back pain over the years. Decreased sensation B feet, almost not noticeable. Feels like he has a lot of weakness which makes it difficult to keep his balance. Has B LE swelling, comes and goes, not currently getting treated for it. No low back pain, just L arm and shoulder.   PAIN:  Are you having pain? No  PRECAUTIONS: Fall  RED FLAGS: Bowel or bladder incontinence: No and Cauda equina syndrome: No   WEIGHT BEARING RESTRICTIONS: No  FALLS:  Has patient fallen in last 6 months? Yes. Number of falls 1 (pt might have slipped on ice or a wet grass)  LIVING ENVIRONMENT: Lives with: lives with their spouse Lives in: House/apartment Stairs: Yes: External: 1 steps; none Has following equipment at home: Single point cane and Grab bars  OCCUPATION: Conservation officer, historic buildings. Planning on retiring.   PLOF: Mod I.   PATIENT GOALS: Be able to stand still without wobbling.   NEXT MD VISIT: Yes with Dr. Carlis Cherry office Jul 10, 2023  OBJECTIVE:  Note: Objective measures were completed at Evaluation unless otherwise noted.  DIAGNOSTIC FINDINGS:  MR LUMBAR SPINE WO CONTRAST  12/26/2016   Narrative & Impression  CLINICAL DATA:  Lower leg neuropathy for several years. No back pain, acute injury or prior relevant surgery.   EXAM: MRI LUMBAR SPINE WITHOUT CONTRAST   TECHNIQUE: Multiplanar, multisequence MR imaging of the lumbar spine was performed. No intravenous contrast was  administered.   COMPARISON:  None.   FINDINGS: Segmentation: Conventional anatomy assumed, with the last open disc space designated L5-S1.   Alignment: There is 11 mm of anterolisthesis at L5-S1 secondary to chronic bilateral L5 pars defects. The alignment is otherwise normal.   Vertebrae: As above, chronic bilateral L5 pars defects. There is associated endplate degeneration at L5-S1. No evidence of acute fracture or worrisome osseous lesion. The visualized sacroiliac joints appear unremarkable.   Conus medullaris: Extends to the T12-L1 level and appears normal.   Paraspinal and other soft tissues: No significant paraspinal findings.   Disc levels:  No significant disc space findings from T11-12 through L2-3.   L3-4: Mild loss of disc height with annular disc bulging and endplate osteophytes. Mild facet and ligamentous hypertrophy. There is borderline spinal stenosis and mild narrowing of the lateral recesses. The foramina are patent.   L4-5: Disc height and hydration are maintained. There is mild disc bulging, facet and ligamentous hypertrophy. No significant spinal stenosis or nerve root encroachment.   L5-S1: Bilateral L5 pars defects. There is advanced degenerative disc disease with loss of disc height, disc uncovering and endplate osteophytes. There is at least moderate foraminal narrowing bilaterally with probable encroachment on both exiting L5 nerve roots. The spinal canal and lateral recesses are widely patent.   IMPRESSION: 1. 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 There is probable chronic bilateral L5 nerve root encroachment. 2. Mild disc bulging at L3-4, contributing to mild narrowing of the lateral recesses, but no nerve root encroachment.     Electronically Signed   By: Elmon Hagedorn M.D.   On: 12/26/2016 16:46   PATIENT SURVEYS:    COGNITION: Overall cognitive status: Within functional limits for tasks assessed    POSTURE: R shoulder lower, upper thoracic flexion, slight R lateral shift, slight R thoracolumbar convexity,  B knee flexion,   PALPATION:  LUMBAR SPECIAL TESTS:    FUNCTIONAL TESTS:  DGI without AD: 12/24 5 times Sit <> stand: 31 seconds with B UE assist     B LE weakness felt.     TUG without AD: 13.81 seconds, 11.15 seconds, 10. 25 seconds     GAIT: Distance walked: 60 ft Assistive device utilized: None Level of assistance: SBA Comments: Decreased stance L LE, B pelvic drop  TREATMENT DATE 07/20/23 :                                                                                                                              -Near continued AMB in session, 2lb AW with multiple obstacles in gym, carrying, various items.  -finished with 10 minutes static balance training, more productive on firm rather than floor surface.    PATIENT EDUCATION:  Education details: there-ex, HEP Person educated: Patient Education method: Explanation Education comprehension: verbalized understanding  HOME EXERCISE PROGRAM: Access Code: [email or web address] URL: [email or web address] Date: 06/14/2023 Prepared by: Suzzane Estes  Exercises - Supine Posterior Pelvic Tilt  - 1 x daily - 7 x weekly - 3 sets - 10 reps - 10 seconds hold - Supine March  - 1 x daily - 7 x weekly - 3 sets - 10 reps - Supine Knee and Hip Extension with Resistance  - 1 x daily - 7 x weekly - 3 sets - 10 reps  Blue band - Seated Cervical Retraction  - 3 x daily - 7 x weekly - 3 sets - 10 reps - 5 seconds hold   ASSESSMENT:  CLINICAL IMPRESSION: Continued to work on  balance interventions today and AFO training. Large focus on LOB recovery strategies in a variety of settings. Intermittent MinA needed. Pt will benefit from continued skilled physical therapy services to improve strength, balance, and function.    OBJECTIVE  IMPAIRMENTS: Abnormal gait, difficulty walking, improper body mechanics, and postural dysfunction.   ACTIVITY LIMITATIONS: carrying, lifting, standing, squatting, stairs, transfers, and locomotion level  PARTICIPATION LIMITATIONS:   PERSONAL FACTORS: Age, Fitness, Past/current experiences, Profession, Time since onset of injury/illness/exacerbation, and 1-2 comorbidities: DM, neuropathy are also affecting patient's functional outcome.   REHAB POTENTIAL: Fair    CLINICAL DECISION MAKING: Stable/uncomplicated, (balance has improved with B AFO per pt)  EVALUATION COMPLEXITY: Low   GOALS: Goals reviewed with patient? Yes  SHORT TERM GOALS: Target date: 06/22/2023  Pt will be independent with his initial HEP to improve strength, balance, function.  Baseline: Pt has not yet started his initial HEP (06/12/2023); No questions (07/18/2023) Goal status: MET   LONG TERM GOALS: Target date: 08/10/2023  Pt will improve his 5 times Sit <> stand time from chair with arms to 12 seconds or less without UE assist as a demonstration of improved functional LE strength.  Baseline: 31 seconds with B UE assist (06/12/2023); 15.66 seconds with B UE assist (07/18/2023) Goal status: PROGRESSING  2.  Pt will improve his Dynamic Gait Index Score to at least 19/24 or more as a demonstration of improved balance.  Baseline: DGI without AD: 12/24 (06/12/2023); 16/24 (07/18/2023) Goal status: PROGRESSING  3.  Pt will improve his (Activities specific Balance Confidence) ABC scale score by at least 10% as a demonstration of improved balance confidence and decreased fall risk.   Baseline: ABC scale score to be obtained next session (06/12/2023); 82.5% (06/25/2023); 85.6% (07/18/2023) Goal status: ONGOING   PLAN:  PT FREQUENCY: 1-2x/week  PT DURATION: 8 weeks  PLANNED INTERVENTIONS: 97110-Therapeutic exercises, 97530- Therapeutic activity, 97112- Neuromuscular re-education, 97535- Self Care, [id number]- Manual therapy,  [id number]- Gait training, [phone]- Canalith repositioning, [id number]- Electrical stimulation (unattended), Patient/Family education, Balance training, and Stair training.  PLAN FOR NEXT SESSION: trunk and glute strengthening, balance training, manual techniques, modalities PRN  Thank you for your referral.  Dawn Eth, PT, DPT 07/20/2023, 8:20 AM  9:03 AM, 07/20/23 Dawn Eth, PT, DPT Physical Therapist - [place] [phone] (Office)

## 2023-07-23 ENCOUNTER — Ambulatory Visit: Attending: Neurology

## 2023-07-23 DIAGNOSIS — R262 Difficulty in walking, not elsewhere classified: Secondary | ICD-10-CM | POA: Insufficient documentation

## 2023-07-23 DIAGNOSIS — Z9181 History of falling: Secondary | ICD-10-CM | POA: Insufficient documentation

## 2023-07-23 DIAGNOSIS — M6281 Muscle weakness (generalized): Secondary | ICD-10-CM | POA: Diagnosis not present

## 2023-07-23 DIAGNOSIS — R2681 Unsteadiness on feet: Secondary | ICD-10-CM | POA: Diagnosis not present

## 2023-07-23 NOTE — Therapy (Signed)
 OUTPATIENT PHYSICAL THERAPY TREATMENT    Patient Name: Tyler Morgan MRN: [id number] DOB:11-Nov-1945, 78 y.o., male Today's Date: 07/23/2023  END OF SESSION:  PT End of Session - 07/23/23 0820     Visit Number 12    Number of Visits 17    Date for PT Re-Evaluation 08/10/23    Authorization Type [employer]    PT Start Time [phone]    PT Stop Time 0900    PT Time Calculation (min) 39 min    Equipment Utilized During Treatment Gait belt    Activity Tolerance Patient tolerated treatment well    Behavior During Therapy WFL for tasks assessed/performed                       Past Medical History:  Diagnosis Date   Diabetes mellitus without complication (HCC)    ED (erectile dysfunction)    Hypertension    Motion sickness    boats   Neuropathy    Prostate enlargement    Urinary retention    Past Surgical History:  Procedure Laterality Date   BASAL CELL CARCINOMA EXCISION     nose    CATARACT EXTRACTION Bilateral 1986, 2014   COLONOSCOPY WITH PROPOFOL  N/A 03/27/2018   Procedure: COLONOSCOPY WITH BIOPSIES;  Surgeon: Selena Daily, MD;  Location: [facility];  Service: Endoscopy;  Laterality: N/A;  Diabetic - oral meds   EYE SURGERY Bilateral [phone]   FRACTURE SURGERY Right    as a child/ right arm   HERNIA REPAIR Right 01-13-14   inguinal hernia   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 N/A 02/07/2017   Procedure: 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;  Surgeon: Dustin Gimenez, MD;  Location: [facility];  Service: Urology;  Laterality: N/A;   POLYPECTOMY N/A 03/27/2018   Procedure: POLYPECTOMY;  Surgeon: Selena Daily, MD;  Location: [facility];  Service: Endoscopy;  Laterality: N/A;   RETINAL DETACHMENT SURGERY  Jan 2015   TONSILLECTOMY  1955   Patient Active Problem List   Diagnosis Date Noted   Acute non-recurrent maxillary sinusitis 06/21/2023   Diabetes mellitus treated  with oral medication (HCC) 03/06/2023   Dysphagia 03/06/2023   PVD (peripheral vascular disease) (HCC) 11/30/2021   B12 deficiency 01/29/2019   History of prostate cancer 12/13/2017   Hemorrhoids 12/13/2017   Sensorimotor neuropathy 12/13/2017   Hyperlipidemia associated with type 2 diabetes mellitus (HCC) 04/02/2017   Advanced care planning/counseling discussion 10/11/2016   Type 2 diabetes mellitus with diabetic neuropathy (HCC) 12/15/2014   Hypertension associated with diabetes (HCC)     PCP: Lemar Pyles, NP   REFERRING PROVIDER: Baldemar Bond, FNP  REFERRING DIAG: R26.89 (ICD-10-CM) - Balance problem  Rationale for Evaluation and Treatment: Rehabilitation  THERAPY DIAG:  Unsteadiness on feet  Muscle weakness (generalized)  Difficulty in walking, not elsewhere classified  History of falling  ONSET DATE: 03/12/2023  (Date PT referral signed, chronic condition)  SUBJECTIVE:  SUBJECTIVE STATEMENT: Doing ok. The ears feel a little stuffy.     PERTINENT HISTORY:  Balance problem. Got AFOs B feet which helps with ankle DF and gait but limits his ankle movement. The stopping after walking with the AFO's a better but still staggers. Still can't stop and put a camera in front of his face and shoot a picture without wobbling. Also had some pain in his L hand (palmar side) and got a cortisone shot which helped his L hand feel better . The whole L side of his body seems to give him the most problem which makes Dr. Bernetta Brilliant wonder about his diabetic neuropathy diagnosis. Balance might have gotten better since November from the AFOs  Blood pressure is controlled.  No latex allergies  From PT eval on 07/24/2022:   Balance, weakness. Can get around well without a cane. Standing still, pt  loses balance. Has to hold onto things to stabilize himself. Has labradore thats about 78 year old and can walk his dog without a leash because the dog's pulling stabilizes him. Symptoms have been going on for a while and was told to have diabetic neuropathy. Had a fall in 2015 while working on the flooring in his den at home which caused R inguinal hernia S/P mesh placement. Pt states noticing problems in his legs since 2015. Pt also could not push up on his toes (tip toe) since then. No back pain currently but has had a hx of back pain over the years. Decreased sensation B feet, almost not noticeable. Feels like he has a lot of weakness which makes it difficult to keep his balance. Has B LE swelling, comes and goes, not currently getting treated for it. No low back pain, just L arm and shoulder.   PAIN:  Are you having pain? No  PRECAUTIONS: Fall  RED FLAGS: Bowel or bladder incontinence: No and Cauda equina syndrome: No   WEIGHT BEARING RESTRICTIONS: No  FALLS:  Has patient fallen in last 6 months? Yes. Number of falls 1 (pt might have slipped on ice or a wet grass)  LIVING ENVIRONMENT: Lives with: lives with their spouse Lives in: House/apartment Stairs: Yes: External: 1 steps; none Has following equipment at home: Single point cane and Grab bars  OCCUPATION: Conservation officer, historic buildings. Planning on retiring.   PLOF: Mod I.   PATIENT GOALS: Be able to stand still without wobbling.   NEXT MD VISIT: Yes with Dr. Carlis Cherry office Jul 10, 2023  OBJECTIVE:  Note: Objective measures were completed at Evaluation unless otherwise noted.  DIAGNOSTIC FINDINGS:  MR LUMBAR SPINE WO CONTRAST  12/26/2016   Narrative & Impression  CLINICAL DATA:  Lower leg neuropathy for several years. No back pain, acute injury or prior relevant surgery.   EXAM: MRI LUMBAR SPINE WITHOUT CONTRAST   TECHNIQUE: Multiplanar, multisequence MR imaging of the lumbar spine was performed. No intravenous contrast was  administered.   COMPARISON:  None.   FINDINGS: Segmentation: Conventional anatomy assumed, with the last open disc space designated L5-S1.   Alignment: There is 11 mm of anterolisthesis at L5-S1 secondary to chronic bilateral L5 pars defects. The alignment is otherwise normal.   Vertebrae: As above, chronic bilateral L5 pars defects. There is associated endplate degeneration at L5-S1. No evidence of acute fracture or worrisome osseous lesion. The visualized sacroiliac joints appear unremarkable.   Conus medullaris: Extends to the T12-L1 level and appears normal.   Paraspinal and other soft tissues: No significant paraspinal findings.   Disc  levels:   No significant disc space findings from T11-12 through L2-3.   L3-4: Mild loss of disc height with annular disc bulging and endplate osteophytes. Mild facet and ligamentous hypertrophy. There is borderline spinal stenosis and mild narrowing of the lateral recesses. The foramina are patent.   L4-5: Disc height and hydration are maintained. There is mild disc bulging, facet and ligamentous hypertrophy. No significant spinal stenosis or nerve root encroachment.   L5-S1: Bilateral L5 pars defects. There is advanced degenerative disc disease with loss of disc height, disc uncovering and endplate osteophytes. There is at least moderate foraminal narrowing bilaterally with probable encroachment on both exiting L5 nerve roots. The spinal canal and lateral recesses are widely patent.   IMPRESSION: 1. 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 There is probable chronic bilateral L5 nerve root encroachment. 2. Mild disc bulging at L3-4, contributing to mild narrowing of the lateral recesses, but no nerve root encroachment.     Electronically Signed   By: Elmon Hagedorn M.D.   On: 12/26/2016 16:46    PATIENT SURVEYS:     COGNITION: Overall cognitive status: Within functional limits for tasks assessed     SENSATION:   MUSCLE LENGTH:   POSTURE: R shoulder lower, upper thoracic flexion, slight R lateral shift, slight R thoracolumbar convexity,  B knee flexion,   PALPATION:   LUMBAR ROM:   AROM eval  Flexion   Extension   Right lateral flexion   Left lateral flexion   Right rotation   Left rotation    (Blank rows = not tested)  LOWER EXTREMITY ROM:     Active  Right eval Left eval  Hip flexion    Hip extension    Hip abduction    Hip adduction    Hip internal rotation    Hip external rotation    Knee flexion    Knee extension    Ankle dorsiflexion    Ankle plantarflexion    Ankle inversion    Ankle eversion     (Blank rows = not tested)  LOWER EXTREMITY MMT:    MMT Right eval Left eval  Hip flexion    Hip extension    Hip abduction    Hip adduction    Hip internal rotation    Hip external rotation    Knee flexion    Knee extension    Ankle dorsiflexion    Ankle plantarflexion    Ankle inversion    Ankle eversion     (Blank rows = not tested)  LUMBAR SPECIAL TESTS:    FUNCTIONAL TESTS:  DGI without AD: 12/24 5 times Sit <> stand: 31 seconds with B UE assist     B LE weakness felt.     TUG without AD: 13.81 seconds, 11.15 seconds, 10. 25 seconds        GAIT: Distance walked: 60 ft Assistive device utilized: None Level of assistance: SBA Comments: Decreased stance L LE, B pelvic drop  TREATMENT DATE: 07/23/2023  Neuromuscular re education Backwards walking with CGA 6x 30 ft with random stops   Cues for maintaining center of gravity over base of support and slight forward weight shift secondary to tendency for backwards weight shift.    Stepping over 2 mini hurdles 10x2 with UE assist PRN  Cues for placing and maintaining his  center of gravity over his base of support (stance foot)  Second set of 10, pt was able to perform majority of repetitions without UE assist   Static standing on Air Ex pad with L eye open   Feet shoulder width apart 30 seconds x 4   L foot forward 30 seconds x 4    R foot forward 30 seconds x 4   Static standing on firm surface balance feet shoulder width apart with eyes closed 1 min x 2   Then with feet together 30 seconds x 4       Improved exercise technique, movement at target joints, use of target muscles after mod verbal, visual, tactile cues.       PATIENT EDUCATION:  Education details: there-ex, HEP Person educated: Patient Education method: Explanation Education comprehension: verbalized understanding  HOME EXERCISE PROGRAM: Access Code: [email or web address] URL: [email or web address] Date: 06/14/2023 Prepared by: Suzzane Estes  Exercises - Supine Posterior Pelvic Tilt  - 1 x daily - 7 x weekly - 3 sets - 10 reps - 10 seconds hold - Supine March  - 1 x daily - 7 x weekly - 3 sets - 10 reps - Supine Knee and Hip Extension with Resistance  - 1 x daily - 7 x weekly - 3 sets - 10 reps  Blue band - Seated Cervical Retraction  - 3 x daily - 7 x weekly - 3 sets - 10 reps - 5 seconds hold   ASSESSMENT:  CLINICAL IMPRESSION: Improving ability to maintain his center of gravity over his base of support observed with balance activities. Continued working on ability to maintain static balance and worked on Surveyor, mining to decrease fall risk.  Pt tolerated session well without aggravation of symptoms. Pt will benefit from continued skilled physical therapy services to improve strength, balance, and function.     OBJECTIVE IMPAIRMENTS: Abnormal gait, difficulty walking, improper body mechanics, and postural dysfunction.   ACTIVITY LIMITATIONS: carrying, lifting, standing, squatting, stairs, transfers, and locomotion level  PARTICIPATION LIMITATIONS:    PERSONAL FACTORS: Age, Fitness, Past/current experiences, Profession, Time since onset of injury/illness/exacerbation, and 1-2 comorbidities: DM, neuropathy are also affecting patient's functional outcome.   REHAB POTENTIAL: Fair    CLINICAL DECISION MAKING: Stable/uncomplicated, (balance has improved with B AFO per pt)  EVALUATION COMPLEXITY: Low   GOALS: Goals reviewed with patient? Yes  SHORT TERM GOALS: Target date: 06/22/2023  Pt will be independent with his initial HEP to improve strength, balance, function.  Baseline: Pt has not yet started his initial HEP (06/12/2023); No questions (07/18/2023) Goal status: MET   LONG TERM GOALS: Target date: 08/10/2023  Pt will improve his 5 times Sit <> stand time from chair with arms to 12 seconds or less without UE assist as a demonstration of improved functional LE strength.  Baseline: 31 seconds with B UE assist (06/12/2023); 15.66 seconds with B UE assist (07/18/2023) Goal status: PROGRESSING  2.  Pt will improve his Dynamic Gait Index Score to at least 19/24 or more as a demonstration of improved balance.  Baseline: DGI without AD: 12/24 (06/12/2023); 16/24 (07/18/2023) Goal status: PROGRESSING  3.  Pt will improve his (  Activities specific Balance Confidence) ABC scale score by at least 10% as a demonstration of improved balance confidence and decreased fall risk.   Baseline: ABC scale score to be obtained next session (06/12/2023); 82.5% (06/25/2023); 85.6% (07/18/2023) Goal status: ONGOING   PLAN:  PT FREQUENCY: 1-2x/week  PT DURATION: 8 weeks  PLANNED INTERVENTIONS: 97110-Therapeutic exercises, 97530- Therapeutic activity, 97112- Neuromuscular re-education, 97535- Self Care, [id number]- Manual therapy, [id number]- Gait training, [phone]- Canalith repositioning, [id number]- Electrical stimulation (unattended), Patient/Family education, Balance training, and Stair training.  PLAN FOR NEXT SESSION: trunk and glute strengthening, balance training,  manual techniques, modalities PRN   Tobenna Needs, PT, DPT 07/23/2023, 10:50 AM

## 2023-07-25 ENCOUNTER — Ambulatory Visit

## 2023-07-25 DIAGNOSIS — Z9181 History of falling: Secondary | ICD-10-CM | POA: Diagnosis not present

## 2023-07-25 DIAGNOSIS — R262 Difficulty in walking, not elsewhere classified: Secondary | ICD-10-CM

## 2023-07-25 DIAGNOSIS — R2681 Unsteadiness on feet: Secondary | ICD-10-CM

## 2023-07-25 DIAGNOSIS — M6281 Muscle weakness (generalized): Secondary | ICD-10-CM | POA: Diagnosis not present

## 2023-07-25 NOTE — Therapy (Signed)
 OUTPATIENT PHYSICAL THERAPY TREATMENT    Patient Name: Tyler Morgan MRN: [id number] DOB:March 10, 1945, 78 y.o., male Today's Date: 07/25/2023  END OF SESSION:  PT End of Session - 07/25/23 0817     Visit Number 13    Number of Visits 17    Date for PT Re-Evaluation 08/10/23    Authorization Type [employer]    PT Start Time 0818    PT Stop Time 0900    PT Time Calculation (min) 42 min    Equipment Utilized During Treatment Gait belt    Activity Tolerance Patient tolerated treatment well    Behavior During Therapy WFL for tasks assessed/performed                        Past Medical History:  Diagnosis Date   Diabetes mellitus without complication (HCC)    ED (erectile dysfunction)    Hypertension    Motion sickness    boats   Neuropathy    Prostate enlargement    Urinary retention    Past Surgical History:  Procedure Laterality Date   BASAL CELL CARCINOMA EXCISION     nose    CATARACT EXTRACTION Bilateral 1986, 2014   COLONOSCOPY WITH PROPOFOL  N/A 03/27/2018   Procedure: COLONOSCOPY WITH BIOPSIES;  Surgeon: Selena Daily, MD;  Location: [facility];  Service: Endoscopy;  Laterality: N/A;  Diabetic - oral meds   EYE SURGERY Bilateral [phone]   FRACTURE SURGERY Right    as a child/ right arm   HERNIA REPAIR Right 01-13-14   inguinal hernia   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 N/A 02/07/2017   Procedure: 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;  Surgeon: Dustin Gimenez, MD;  Location: [facility];  Service: Urology;  Laterality: N/A;   POLYPECTOMY N/A 03/27/2018   Procedure: POLYPECTOMY;  Surgeon: Selena Daily, MD;  Location: [facility];  Service: Endoscopy;  Laterality: N/A;   RETINAL DETACHMENT SURGERY  Jan 2015   TONSILLECTOMY  1955   Patient Active Problem List   Diagnosis Date Noted   Acute non-recurrent maxillary sinusitis 06/21/2023   Diabetes mellitus  treated with oral medication (HCC) 03/06/2023   Dysphagia 03/06/2023   PVD (peripheral vascular disease) (HCC) 11/30/2021   B12 deficiency 01/29/2019   History of prostate cancer 12/13/2017   Hemorrhoids 12/13/2017   Sensorimotor neuropathy 12/13/2017   Hyperlipidemia associated with type 2 diabetes mellitus (HCC) 04/02/2017   Advanced care planning/counseling discussion 10/11/2016   Type 2 diabetes mellitus with diabetic neuropathy (HCC) 12/15/2014   Hypertension associated with diabetes (HCC)     PCP: Lemar Pyles, NP   REFERRING PROVIDER: Baldemar Bond, FNP  REFERRING DIAG: R26.89 (ICD-10-CM) - Balance problem  Rationale for Evaluation and Treatment: Rehabilitation  THERAPY DIAG:  Unsteadiness on feet  Muscle weakness (generalized)  Difficulty in walking, not elsewhere classified  History of falling  ONSET DATE: 03/12/2023  (Date PT referral signed, chronic condition)  SUBJECTIVE:  SUBJECTIVE STATEMENT: Doing ok. Has some difficulty hearing. Sleeps on his side and stomach.     PERTINENT HISTORY:  Balance problem. Got AFOs B feet which helps with ankle DF and gait but limits his ankle movement. The stopping after walking with the AFO's a better but still staggers. Still can't stop and put a camera in front of his face and shoot a picture without wobbling. Also had some pain in his L hand (palmar side) and got a cortisone shot which helped his L hand feel better . The whole L side of his body seems to give him the most problem which makes Dr. Bernetta Brilliant wonder about his diabetic neuropathy diagnosis. Balance might have gotten better since November from the AFOs  Blood pressure is controlled.  No latex allergies  From PT eval on 07/24/2022:   Balance, weakness. Can get around  well without a cane. Standing still, pt loses balance. Has to hold onto things to stabilize himself. Has labradore thats about 78 year old and can walk his dog without a leash because the dog's pulling stabilizes him. Symptoms have been going on for a while and was told to have diabetic neuropathy. Had a fall in 2015 while working on the flooring in his den at home which caused R inguinal hernia S/P mesh placement. Pt states noticing problems in his legs since 2015. Pt also could not push up on his toes (tip toe) since then. No back pain currently but has had a hx of back pain over the years. Decreased sensation B feet, almost not noticeable. Feels like he has a lot of weakness which makes it difficult to keep his balance. Has B LE swelling, comes and goes, not currently getting treated for it. No low back pain, just L arm and shoulder.   PAIN:  Are you having pain? No  PRECAUTIONS: Fall  RED FLAGS: Bowel or bladder incontinence: No and Cauda equina syndrome: No   WEIGHT BEARING RESTRICTIONS: No  FALLS:  Has patient fallen in last 6 months? Yes. Number of falls 1 (pt might have slipped on ice or a wet grass)  LIVING ENVIRONMENT: Lives with: lives with their spouse Lives in: House/apartment Stairs: Yes: External: 1 steps; none Has following equipment at home: Single point cane and Grab bars  OCCUPATION: Conservation officer, historic buildings. Planning on retiring.   PLOF: Mod I.   PATIENT GOALS: Be able to stand still without wobbling.   NEXT MD VISIT: Yes with Dr. Carlis Cherry office Jul 10, 2023  OBJECTIVE:  Note: Objective measures were completed at Evaluation unless otherwise noted.  DIAGNOSTIC FINDINGS:  MR LUMBAR SPINE WO CONTRAST  12/26/2016   Narrative & Impression  CLINICAL DATA:  Lower leg neuropathy for several years. No back pain, acute injury or prior relevant surgery.   EXAM: MRI LUMBAR SPINE WITHOUT CONTRAST   TECHNIQUE: Multiplanar, multisequence MR imaging of the lumbar spine  was performed. No intravenous contrast was administered.   COMPARISON:  None.   FINDINGS: Segmentation: Conventional anatomy assumed, with the last open disc space designated L5-S1.   Alignment: There is 11 mm of anterolisthesis at L5-S1 secondary to chronic bilateral L5 pars defects. The alignment is otherwise normal.   Vertebrae: As above, chronic bilateral L5 pars defects. There is associated endplate degeneration at L5-S1. No evidence of acute fracture or worrisome osseous lesion. The visualized sacroiliac joints appear unremarkable.   Conus medullaris: Extends to the T12-L1 level and appears normal.   Paraspinal and other soft tissues: No significant paraspinal  findings.   Disc levels:   No significant disc space findings from T11-12 through L2-3.   L3-4: Mild loss of disc height with annular disc bulging and endplate osteophytes. Mild facet and ligamentous hypertrophy. There is borderline spinal stenosis and mild narrowing of the lateral recesses. The foramina are patent.   L4-5: Disc height and hydration are maintained. There is mild disc bulging, facet and ligamentous hypertrophy. No significant spinal stenosis or nerve root encroachment.   L5-S1: Bilateral L5 pars defects. There is advanced degenerative disc disease with loss of disc height, disc uncovering and endplate osteophytes. There is at least moderate foraminal narrowing bilaterally with probable encroachment on both exiting L5 nerve roots. The spinal canal and lateral recesses are widely patent.   IMPRESSION: 1. 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 There is probable chronic bilateral L5 nerve root encroachment. 2. Mild disc bulging at L3-4, contributing to mild narrowing of the lateral recesses, but no nerve root encroachment.     Electronically Signed   By: Elmon Hagedorn M.D.   On: 12/26/2016  16:46    PATIENT SURVEYS:    COGNITION: Overall cognitive status: Within functional limits for tasks assessed     SENSATION:   MUSCLE LENGTH:   POSTURE: R shoulder lower, upper thoracic flexion, slight R lateral shift, slight R thoracolumbar convexity,  B knee flexion,   PALPATION:   LUMBAR ROM:   AROM eval  Flexion   Extension   Right lateral flexion   Left lateral flexion   Right rotation   Left rotation    (Blank rows = not tested)  LOWER EXTREMITY ROM:     Active  Right eval Left eval  Hip flexion    Hip extension    Hip abduction    Hip adduction    Hip internal rotation    Hip external rotation    Knee flexion    Knee extension    Ankle dorsiflexion    Ankle plantarflexion    Ankle inversion    Ankle eversion     (Blank rows = not tested)  LOWER EXTREMITY MMT:    MMT Right eval Left eval  Hip flexion    Hip extension    Hip abduction    Hip adduction    Hip internal rotation    Hip external rotation    Knee flexion    Knee extension    Ankle dorsiflexion    Ankle plantarflexion    Ankle inversion    Ankle eversion     (Blank rows = not tested)  LUMBAR SPECIAL TESTS:    FUNCTIONAL TESTS:  DGI without AD: 12/24 5 times Sit <> stand: 31 seconds with B UE assist     B LE weakness felt.     TUG without AD: 13.81 seconds, 11.15 seconds, 10. 25 seconds        GAIT: Distance walked: 60 ft Assistive device utilized: None Level of assistance: SBA Comments: Decreased stance L LE, B pelvic drop  TREATMENT DATE: 07/25/2023  Manual therapy R rotation of C7 palpated  Seated STM B upper trap and cervical paraspinal muscles to decrease tension      Neuromuscular re education  Outside at [facility] with incline and decline about 25 ft each direction  Forward walk, then with sudden stops multiple  times Backward walk, then with sudden stops multiple times  Static standing with L eye open 30 seconds 1 x   Stepping over 2 mini hurdles 6x with UE assist PRN   Feels like the room is turning around on the inside.     Cues for placing and maintaining his center of gravity over his base of support (stance foot)  Then after manual therapy 10x  The internal room spinning sensation is better reported. Pt however had increased tendency for backwards lean LOB about 2x with min A to recover.   Cues for placing and maintaining his center of gravity over his base of support (stance foot)  Seated manually resisted scapular retraction targeting the lower trap  R 10x4 with 5 second holds  Improved cervical spine posture palpated   Stepping over 2 mini hurdles 10x with UE assist PRN  Improved ability to perform per pt. Pt still demonstrates backwards lean after R scapular strengthening  Cues for placing and maintaining his center of gravity over his base of support (stance foot)     Improved exercise technique, movement at target joints, use of target muscles after mod verbal, visual, tactile cues.       PATIENT EDUCATION:  Education details: there-ex, HEP Person educated: Patient Education method: Explanation Education comprehension: verbalized understanding  HOME EXERCISE PROGRAM: Access Code: [email or web address] URL: [email or web address] Date: 06/14/2023 Prepared by: Suzzane Estes  Exercises - Supine Posterior Pelvic Tilt  - 1 x daily - 7 x weekly - 3 sets - 10 reps - 10 seconds hold - Supine March  - 1 x daily - 7 x weekly - 3 sets - 10 reps - Supine Knee and Hip Extension with Resistance  - 1 x daily - 7 x weekly - 3 sets - 10 reps  Blue band - Seated Cervical Retraction  - 3 x daily - 7 x weekly - 3 sets - 10 reps - 5 seconds hold   ASSESSMENT:  CLINICAL IMPRESSION: Able to perform outdoor ambulation on incline and decline on mulch (uneven surfaces) forward,  backwards, and sudden stops well with minimal LOB observed. Pt however had more difficulty with static standing on firm and level surface today inside. Worked on obstacle negotiation without UE assist as well in which pt demonstrated difficulty with reports of internal sensation of room spinning. Utilized manual techniques and muscle activation to decrease muscle tension to his neck as well as promoting a more neutral cervical spine posture. Improved feeling of steadiness with obstacle negotiation reported afterwards. Pt was recommended to not sleep on his stomach, and to sleep more on his back if able so as not to place the neck in awkward positions which may affect its posture and possibly cause cervicogenic feeling of unsteadiness. Pt verbalized understanding.   Pt tolerated session well without aggravation of symptoms. Pt will benefit from continued skilled physical therapy services to improve strength, balance, and function.     OBJECTIVE IMPAIRMENTS: Abnormal gait, difficulty walking, improper body mechanics, and postural dysfunction.   ACTIVITY LIMITATIONS: carrying, lifting, standing, squatting, stairs, transfers, and locomotion level  PARTICIPATION LIMITATIONS:   PERSONAL FACTORS: Age, Fitness, Past/current experiences, Profession, Time since onset of injury/illness/exacerbation, and 1-2 comorbidities: DM,  neuropathy are also affecting patient's functional outcome.   REHAB POTENTIAL: Fair    CLINICAL DECISION MAKING: Stable/uncomplicated, (balance has improved with B AFO per pt)  EVALUATION COMPLEXITY: Low   GOALS: Goals reviewed with patient? Yes  SHORT TERM GOALS: Target date: 06/22/2023  Pt will be independent with his initial HEP to improve strength, balance, function.  Baseline: Pt has not yet started his initial HEP (06/12/2023); No questions (07/18/2023) Goal status: MET   LONG TERM GOALS: Target date: 08/10/2023  Pt will improve his 5 times Sit <> stand time from chair with  arms to 12 seconds or less without UE assist as a demonstration of improved functional LE strength.  Baseline: 31 seconds with B UE assist (06/12/2023); 15.66 seconds with B UE assist (07/18/2023) Goal status: PROGRESSING  2.  Pt will improve his Dynamic Gait Index Score to at least 19/24 or more as a demonstration of improved balance.  Baseline: DGI without AD: 12/24 (06/12/2023); 16/24 (07/18/2023) Goal status: PROGRESSING  3.  Pt will improve his (Activities specific Balance Confidence) ABC scale score by at least 10% as a demonstration of improved balance confidence and decreased fall risk.   Baseline: ABC scale score to be obtained next session (06/12/2023); 82.5% (06/25/2023); 85.6% (07/18/2023) Goal status: ONGOING   PLAN:  PT FREQUENCY: 1-2x/week  PT DURATION: 8 weeks  PLANNED INTERVENTIONS: 97110-Therapeutic exercises, 97530- Therapeutic activity, 97112- Neuromuscular re-education, 97535- Self Care, [id number]- Manual therapy, [id number]- Gait training, [phone]- Canalith repositioning, [id number]- Electrical stimulation (unattended), Patient/Family education, Balance training, and Stair training.  PLAN FOR NEXT SESSION: trunk and glute strengthening, balance training, manual techniques, modalities PRN   Amonie Wisser, PT, DPT 07/25/2023, 9:16 AM

## 2023-07-30 ENCOUNTER — Ambulatory Visit

## 2023-07-30 DIAGNOSIS — R262 Difficulty in walking, not elsewhere classified: Secondary | ICD-10-CM | POA: Diagnosis not present

## 2023-07-30 DIAGNOSIS — R2681 Unsteadiness on feet: Secondary | ICD-10-CM

## 2023-07-30 DIAGNOSIS — Z9181 History of falling: Secondary | ICD-10-CM

## 2023-07-30 DIAGNOSIS — M6281 Muscle weakness (generalized): Secondary | ICD-10-CM | POA: Diagnosis not present

## 2023-07-30 NOTE — Therapy (Signed)
 OUTPATIENT PHYSICAL THERAPY TREATMENT    Patient Name: Tyler Morgan MRN: [id number] DOB:31-May-1945, 78 y.o., male Today's Date: 07/30/2023  END OF SESSION:  PT End of Session - 07/30/23 0815     Visit Number 14    Number of Visits 17    Date for PT Re-Evaluation 08/10/23    Authorization Type [employer]    PT Start Time 0815    PT Stop Time [id number]    PT Time Calculation (min) 41 min    Equipment Utilized During Treatment Gait belt    Activity Tolerance Patient tolerated treatment well    Behavior During Therapy WFL for tasks assessed/performed                         Past Medical History:  Diagnosis Date   Diabetes mellitus without complication (HCC)    ED (erectile dysfunction)    Hypertension    Motion sickness    boats   Neuropathy    Prostate enlargement    Urinary retention    Past Surgical History:  Procedure Laterality Date   BASAL CELL CARCINOMA EXCISION     nose    CATARACT EXTRACTION Bilateral 1986, 2014   COLONOSCOPY WITH PROPOFOL  N/A 03/27/2018   Procedure: COLONOSCOPY WITH BIOPSIES;  Surgeon: Selena Daily, MD;  Location: [facility];  Service: Endoscopy;  Laterality: N/A;  Diabetic - oral meds   EYE SURGERY Bilateral [phone]   FRACTURE SURGERY Right    as a child/ right arm   HERNIA REPAIR Right 01-13-14   inguinal hernia   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 N/A 02/07/2017   Procedure: 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;  Surgeon: Dustin Gimenez, MD;  Location: [facility];  Service: Urology;  Laterality: N/A;   POLYPECTOMY N/A 03/27/2018   Procedure: POLYPECTOMY;  Surgeon: Selena Daily, MD;  Location: [facility];  Service: Endoscopy;  Laterality: N/A;   RETINAL DETACHMENT SURGERY  Jan 2015   TONSILLECTOMY  1955   Patient Active Problem List   Diagnosis Date Noted   Acute non-recurrent maxillary sinusitis 06/21/2023   Diabetes mellitus  treated with oral medication (HCC) 03/06/2023   Dysphagia 03/06/2023   PVD (peripheral vascular disease) (HCC) 11/30/2021   B12 deficiency 01/29/2019   History of prostate cancer 12/13/2017   Hemorrhoids 12/13/2017   Sensorimotor neuropathy 12/13/2017   Hyperlipidemia associated with type 2 diabetes mellitus (HCC) 04/02/2017   Advanced care planning/counseling discussion 10/11/2016   Type 2 diabetes mellitus with diabetic neuropathy (HCC) 12/15/2014   Hypertension associated with diabetes (HCC)     PCP: Lemar Pyles, NP   REFERRING PROVIDER: Baldemar Bond, FNP  REFERRING DIAG: R26.89 (ICD-10-CM) - Balance problem  Rationale for Evaluation and Treatment: Rehabilitation  THERAPY DIAG:  Unsteadiness on feet  Muscle weakness (generalized)  Difficulty in walking, not elsewhere classified  History of falling  ONSET DATE: 03/12/2023  (Date PT referral signed, chronic condition)  SUBJECTIVE:  SUBJECTIVE STATEMENT: Room feels unsteady, does not look unsteady. Thinks that the manual therapy to his neck and upper trap last session might have helped with that.     PERTINENT HISTORY:  Balance problem. Got AFOs B feet which helps with ankle DF and gait but limits his ankle movement. The stopping after walking with the AFO's a better but still staggers. Still can't stop and put a camera in front of his face and shoot a picture without wobbling. Also had some pain in his L hand (palmar side) and got a cortisone shot which helped his L hand feel better . The whole L side of his body seems to give him the most problem which makes Dr. Bernetta Brilliant wonder about his diabetic neuropathy diagnosis. Balance might have gotten better since November from the AFOs  Blood pressure is controlled.  No latex  allergies  From PT eval on 07/24/2022:   Balance, weakness. Can get around well without a cane. Standing still, pt loses balance. Has to hold onto things to stabilize himself. Has labradore thats about 78 year old and can walk his dog without a leash because the dog's pulling stabilizes him. Symptoms have been going on for a while and was told to have diabetic neuropathy. Had a fall in 2015 while working on the flooring in his den at home which caused R inguinal hernia S/P mesh placement. Pt states noticing problems in his legs since 2015. Pt also could not push up on his toes (tip toe) since then. No back pain currently but has had a hx of back pain over the years. Decreased sensation B feet, almost not noticeable. Feels like he has a lot of weakness which makes it difficult to keep his balance. Has B LE swelling, comes and goes, not currently getting treated for it. No low back pain, just L arm and shoulder.   PAIN:  Are you having pain? No  PRECAUTIONS: Fall  RED FLAGS: Bowel or bladder incontinence: No and Cauda equina syndrome: No   WEIGHT BEARING RESTRICTIONS: No  FALLS:  Has patient fallen in last 6 months? Yes. Number of falls 1 (pt might have slipped on ice or a wet grass)  LIVING ENVIRONMENT: Lives with: lives with their spouse Lives in: House/apartment Stairs: Yes: External: 1 steps; none Has following equipment at home: Single point cane and Grab bars  OCCUPATION: Conservation officer, historic buildings. Planning on retiring.   PLOF: Mod I.   PATIENT GOALS: Be able to stand still without wobbling.   NEXT MD VISIT: Yes with Dr. Carlis Cherry office Jul 10, 2023  OBJECTIVE:  Note: Objective measures were completed at Evaluation unless otherwise noted.  DIAGNOSTIC FINDINGS:  MR LUMBAR SPINE WO CONTRAST  12/26/2016   Narrative & Impression  CLINICAL DATA:  Lower leg neuropathy for several years. No back pain, acute injury or prior relevant surgery.   EXAM: MRI LUMBAR SPINE WITHOUT CONTRAST    TECHNIQUE: Multiplanar, multisequence MR imaging of the lumbar spine was performed. No intravenous contrast was administered.   COMPARISON:  None.   FINDINGS: Segmentation: Conventional anatomy assumed, with the last open disc space designated L5-S1.   Alignment: There is 11 mm of anterolisthesis at L5-S1 secondary to chronic bilateral L5 pars defects. The alignment is otherwise normal.   Vertebrae: As above, chronic bilateral L5 pars defects. There is associated endplate degeneration at L5-S1. No evidence of acute fracture or worrisome osseous lesion. The visualized sacroiliac joints appear unremarkable.   Conus medullaris: Extends to the T12-L1 level  and appears normal.   Paraspinal and other soft tissues: No significant paraspinal findings.   Disc levels:   No significant disc space findings from T11-12 through L2-3.   L3-4: Mild loss of disc height with annular disc bulging and endplate osteophytes. Mild facet and ligamentous hypertrophy. There is borderline spinal stenosis and mild narrowing of the lateral recesses. The foramina are patent.   L4-5: Disc height and hydration are maintained. There is mild disc bulging, facet and ligamentous hypertrophy. No significant spinal stenosis or nerve root encroachment.   L5-S1: Bilateral L5 pars defects. There is advanced degenerative disc disease with loss of disc height, disc uncovering and endplate osteophytes. There is at least moderate foraminal narrowing bilaterally with probable encroachment on both exiting L5 nerve roots. The spinal canal and lateral recesses are widely patent.   IMPRESSION: 1. 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 There is probable chronic bilateral L5 nerve root encroachment. 2. Mild disc bulging at L3-4, contributing to mild narrowing of the lateral recesses, but no nerve root encroachment.      Electronically Signed   By: Elmon Hagedorn M.D.   On: 12/26/2016 16:46    PATIENT SURVEYS:    COGNITION: Overall cognitive status: Within functional limits for tasks assessed     SENSATION:   MUSCLE LENGTH:   POSTURE: R shoulder lower, upper thoracic flexion, slight R lateral shift, slight R thoracolumbar convexity,  B knee flexion,   PALPATION:   LUMBAR ROM:   AROM eval  Flexion   Extension   Right lateral flexion   Left lateral flexion   Right rotation   Left rotation    (Blank rows = not tested)  LOWER EXTREMITY ROM:     Active  Right eval Left eval  Hip flexion    Hip extension    Hip abduction    Hip adduction    Hip internal rotation    Hip external rotation    Knee flexion    Knee extension    Ankle dorsiflexion    Ankle plantarflexion    Ankle inversion    Ankle eversion     (Blank rows = not tested)  LOWER EXTREMITY MMT:    MMT Right eval Left eval  Hip flexion    Hip extension    Hip abduction    Hip adduction    Hip internal rotation    Hip external rotation    Knee flexion    Knee extension    Ankle dorsiflexion    Ankle plantarflexion    Ankle inversion    Ankle eversion     (Blank rows = not tested)  LUMBAR SPECIAL TESTS:    FUNCTIONAL TESTS:  DGI without AD: 12/24 5 times Sit <> stand: 31 seconds with B UE assist     B LE weakness felt.     TUG without AD: 13.81 seconds, 11.15 seconds, 10. 25 seconds        GAIT: Distance walked: 60 ft Assistive device utilized: None Level of assistance: SBA Comments: Decreased stance L LE, B pelvic drop  TREATMENT DATE: 07/30/2023  Manual therapy  R rotation of C7 palpated  Seated STM L > R  upper trap and cervical paraspinal muscles to decrease tension      Neuromuscular re education  Seated manually resisted scapular retraction targeting  the lower trap  R 10x4 with 5 second holds  Improved cervical spine posture palpated  Seated chin tuck 10x5 seconds for 3 sets   To promote thoracic extension and decrease neck stiffness to help decrease cervicogenic involvement with unsteadiness.   Stepping over 2 mini hurdles 10x with UE assist PRN  Improved ability to perform per pt. Pt still demonstrates backwards lean after R scapular strengthening  Cues for placing and maintaining his center of gravity over his base of support (stance foot)   Static standing firm surface, eyes closed, feet shoulder width apart 10x10 seconds  For 2 sets     Improved exercise technique, movement at target joints, use of target muscles after mod verbal, visual, tactile cues.       PATIENT EDUCATION:  Education details: there-ex, HEP Person educated: Patient Education method: Explanation Education comprehension: verbalized understanding  HOME EXERCISE PROGRAM: Access Code: [email or web address] URL: [email or web address] Date: 06/14/2023 Prepared by: Suzzane Estes  Exercises - Supine Posterior Pelvic Tilt  - 1 x daily - 7 x weekly - 3 sets - 10 reps - 10 seconds hold - Supine March  - 1 x daily - 7 x weekly - 3 sets - 10 reps - Supine Knee and Hip Extension with Resistance  - 1 x daily - 7 x weekly - 3 sets - 10 reps  Blue band - Seated Cervical Retraction  - 3 x daily - 7 x weekly - 3 sets - 10 reps - 5 seconds hold   ASSESSMENT:  CLINICAL IMPRESSION: Continued working on decreasing cervical and upper trap muscle tension to neck, as well as promoting thoracic extension to help decrease neck stiffness. Minor improvement in balance reported. Continued working on obstacle negotiation and static standing balance to help decrease fall risk. Pt still demonstrates tendency for backwards lean, cues needed to forward weight shift onto forefeet to help maintain balance.  Pt tolerated session well without aggravation of symptoms. Pt will  benefit from continued skilled physical therapy services to improve strength, balance, and function.     OBJECTIVE IMPAIRMENTS: Abnormal gait, difficulty walking, improper body mechanics, and postural dysfunction.   ACTIVITY LIMITATIONS: carrying, lifting, standing, squatting, stairs, transfers, and locomotion level  PARTICIPATION LIMITATIONS:   PERSONAL FACTORS: Age, Fitness, Past/current experiences, Profession, Time since onset of injury/illness/exacerbation, and 1-2 comorbidities: DM, neuropathy are also affecting patient's functional outcome.   REHAB POTENTIAL: Fair    CLINICAL DECISION MAKING: Stable/uncomplicated, (balance has improved with B AFO per pt)  EVALUATION COMPLEXITY: Low   GOALS: Goals reviewed with patient? Yes  SHORT TERM GOALS: Target date: 06/22/2023  Pt will be independent with his initial HEP to improve strength, balance, function.  Baseline: Pt has not yet started his initial HEP (06/12/2023); No questions (07/18/2023) Goal status: MET   LONG TERM GOALS: Target date: 08/10/2023  Pt will improve his 5 times Sit <> stand time from chair with arms to 12 seconds or less without UE assist as a demonstration of improved functional LE strength.  Baseline: 31 seconds with B UE assist (06/12/2023); 15.66 seconds with B UE assist (07/18/2023) Goal status: PROGRESSING  2.  Pt will improve his Dynamic Gait Index Score to at least 19/24 or more as a demonstration of improved balance.  Baseline: DGI  without AD: 12/24 (06/12/2023); 16/24 (07/18/2023) Goal status: PROGRESSING  3.  Pt will improve his (Activities specific Balance Confidence) ABC scale score by at least 10% as a demonstration of improved balance confidence and decreased fall risk.   Baseline: ABC scale score to be obtained next session (06/12/2023); 82.5% (06/25/2023); 85.6% (07/18/2023) Goal status: ONGOING   PLAN:  PT FREQUENCY: 1-2x/week  PT DURATION: 8 weeks  PLANNED INTERVENTIONS: 97110-Therapeutic  exercises, 97530- Therapeutic activity, 97112- Neuromuscular re-education, 97535- Self Care, 16109- Manual therapy, [id number]- Gait training, [phone]- Canalith repositioning, [id number]- Electrical stimulation (unattended), Patient/Family education, Balance training, and Stair training.  PLAN FOR NEXT SESSION: trunk and glute strengthening, balance training, manual techniques, modalities PRN   Rashard Ryle, PT, DPT 07/30/2023, 8:59 AM

## 2023-07-31 ENCOUNTER — Other Ambulatory Visit: Payer: Self-pay | Admitting: Nurse Practitioner

## 2023-08-01 ENCOUNTER — Ambulatory Visit

## 2023-08-01 DIAGNOSIS — M6281 Muscle weakness (generalized): Secondary | ICD-10-CM

## 2023-08-01 DIAGNOSIS — R262 Difficulty in walking, not elsewhere classified: Secondary | ICD-10-CM | POA: Diagnosis not present

## 2023-08-01 DIAGNOSIS — R2681 Unsteadiness on feet: Secondary | ICD-10-CM

## 2023-08-01 DIAGNOSIS — Z9181 History of falling: Secondary | ICD-10-CM

## 2023-08-01 NOTE — Therapy (Signed)
 OUTPATIENT PHYSICAL THERAPY TREATMENT    Patient Name: Tyler Morgan MRN: [id number] DOB:1945/06/10, 78 y.o., male Today's Date: 08/01/2023  END OF SESSION:  PT End of Session - 08/01/23 0816     Visit Number 15    Number of Visits 17    Date for PT Re-Evaluation 08/10/23    Authorization Type [employer]    PT Start Time [id number]    PT Stop Time 0856    PT Time Calculation (min) 39 min    Equipment Utilized During Treatment Gait belt    Activity Tolerance Patient tolerated treatment well    Behavior During Therapy WFL for tasks assessed/performed                          Past Medical History:  Diagnosis Date   Diabetes mellitus without complication (HCC)    ED (erectile dysfunction)    Hypertension    Motion sickness    boats   Neuropathy    Prostate enlargement    Urinary retention    Past Surgical History:  Procedure Laterality Date   BASAL CELL CARCINOMA EXCISION     nose    CATARACT EXTRACTION Bilateral 1986, 2014   COLONOSCOPY WITH PROPOFOL  N/A 03/27/2018   Procedure: COLONOSCOPY WITH BIOPSIES;  Surgeon: Selena Daily, MD;  Location: [facility];  Service: Endoscopy;  Laterality: N/A;  Diabetic - oral meds   EYE SURGERY Bilateral [phone]   FRACTURE SURGERY Right    as a child/ right arm   HERNIA REPAIR Right 01-13-14   inguinal hernia   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 N/A 02/07/2017   Procedure: 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;  Surgeon: Dustin Gimenez, MD;  Location: [facility];  Service: Urology;  Laterality: N/A;   POLYPECTOMY N/A 03/27/2018   Procedure: POLYPECTOMY;  Surgeon: Selena Daily, MD;  Location: [facility];  Service: Endoscopy;  Laterality: N/A;   RETINAL DETACHMENT SURGERY  Jan 2015   TONSILLECTOMY  1955   Patient Active Problem List   Diagnosis Date Noted   Acute non-recurrent maxillary sinusitis 06/21/2023   Diabetes mellitus  treated with oral medication (HCC) 03/06/2023   Dysphagia 03/06/2023   PVD (peripheral vascular disease) (HCC) 11/30/2021   B12 deficiency 01/29/2019   History of prostate cancer 12/13/2017   Hemorrhoids 12/13/2017   Sensorimotor neuropathy 12/13/2017   Hyperlipidemia associated with type 2 diabetes mellitus (HCC) 04/02/2017   Advanced care planning/counseling discussion 10/11/2016   Type 2 diabetes mellitus with diabetic neuropathy (HCC) 12/15/2014   Hypertension associated with diabetes (HCC)     PCP: Lemar Pyles, NP   REFERRING PROVIDER: Baldemar Bond, FNP  REFERRING DIAG: R26.89 (ICD-10-CM) - Balance problem  Rationale for Evaluation and Treatment: Rehabilitation  THERAPY DIAG:  Unsteadiness on feet  Muscle weakness (generalized)  Difficulty in walking, not elsewhere classified  History of falling  ONSET DATE: 03/12/2023  (Date PT referral signed, chronic condition)  SUBJECTIVE:  SUBJECTIVE STATEMENT: The feeling of unsteadiness is not bad today.     PERTINENT HISTORY:  Balance problem. Got AFOs B feet which helps with ankle DF and gait but limits his ankle movement. The stopping after walking with the AFO's a better but still staggers. Still can't stop and put a camera in front of his face and shoot a picture without wobbling. Also had some pain in his L hand (palmar side) and got a cortisone shot which helped his L hand feel better . The whole L side of his body seems to give him the most problem which makes Dr. Bernetta Brilliant wonder about his diabetic neuropathy diagnosis. Balance might have gotten better since November from the AFOs  Blood pressure is controlled.  No latex allergies  From PT eval on 07/24/2022:   Balance, weakness. Can get around well without a cane.  Standing still, pt loses balance. Has to hold onto things to stabilize himself. Has labradore thats about 78 year old and can walk his dog without a leash because the dog's pulling stabilizes him. Symptoms have been going on for a while and was told to have diabetic neuropathy. Had a fall in 2015 while working on the flooring in his den at home which caused R inguinal hernia S/P mesh placement. Pt states noticing problems in his legs since 2015. Pt also could not push up on his toes (tip toe) since then. No back pain currently but has had a hx of back pain over the years. Decreased sensation B feet, almost not noticeable. Feels like he has a lot of weakness which makes it difficult to keep his balance. Has B LE swelling, comes and goes, not currently getting treated for it. No low back pain, just L arm and shoulder.   PAIN:  Are you having pain? No  PRECAUTIONS: Fall  RED FLAGS: Bowel or bladder incontinence: No and Cauda equina syndrome: No   WEIGHT BEARING RESTRICTIONS: No  FALLS:  Has patient fallen in last 6 months? Yes. Number of falls 1 (pt might have slipped on ice or a wet grass)  LIVING ENVIRONMENT: Lives with: lives with their spouse Lives in: House/apartment Stairs: Yes: External: 1 steps; none Has following equipment at home: Single point cane and Grab bars  OCCUPATION: Conservation officer, historic buildings. Planning on retiring.   PLOF: Mod I.   PATIENT GOALS: Be able to stand still without wobbling.   NEXT MD VISIT: Yes with Dr. Carlis Cherry office Jul 10, 2023  OBJECTIVE:  Note: Objective measures were completed at Evaluation unless otherwise noted.  DIAGNOSTIC FINDINGS:  MR LUMBAR SPINE WO CONTRAST  12/26/2016   Narrative & Impression  CLINICAL DATA:  Lower leg neuropathy for several years. No back pain, acute injury or prior relevant surgery.   EXAM: MRI LUMBAR SPINE WITHOUT CONTRAST   TECHNIQUE: Multiplanar, multisequence MR imaging of the lumbar spine was performed. No  intravenous contrast was administered.   COMPARISON:  None.   FINDINGS: Segmentation: Conventional anatomy assumed, with the last open disc space designated L5-S1.   Alignment: There is 11 mm of anterolisthesis at L5-S1 secondary to chronic bilateral L5 pars defects. The alignment is otherwise normal.   Vertebrae: As above, chronic bilateral L5 pars defects. There is associated endplate degeneration at L5-S1. No evidence of acute fracture or worrisome osseous lesion. The visualized sacroiliac joints appear unremarkable.   Conus medullaris: Extends to the T12-L1 level and appears normal.   Paraspinal and other soft tissues: No significant paraspinal findings.   Disc  levels:   No significant disc space findings from T11-12 through L2-3.   L3-4: Mild loss of disc height with annular disc bulging and endplate osteophytes. Mild facet and ligamentous hypertrophy. There is borderline spinal stenosis and mild narrowing of the lateral recesses. The foramina are patent.   L4-5: Disc height and hydration are maintained. There is mild disc bulging, facet and ligamentous hypertrophy. No significant spinal stenosis or nerve root encroachment.   L5-S1: Bilateral L5 pars defects. There is advanced degenerative disc disease with loss of disc height, disc uncovering and endplate osteophytes. There is at least moderate foraminal narrowing bilaterally with probable encroachment on both exiting L5 nerve roots. The spinal canal and lateral recesses are widely patent.   IMPRESSION: 1. 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 There is probable chronic bilateral L5 nerve root encroachment. 2. Mild disc bulging at L3-4, contributing to mild narrowing of the lateral recesses, but no nerve root encroachment.     Electronically Signed   By: Elmon Hagedorn M.D.   On: 12/26/2016 16:46    PATIENT  SURVEYS:    COGNITION: Overall cognitive status: Within functional limits for tasks assessed     SENSATION:   MUSCLE LENGTH:   POSTURE: R shoulder lower, upper thoracic flexion, slight R lateral shift, slight R thoracolumbar convexity,  B knee flexion,   PALPATION:   LUMBAR ROM:   AROM eval  Flexion   Extension   Right lateral flexion   Left lateral flexion   Right rotation   Left rotation    (Blank rows = not tested)  LOWER EXTREMITY ROM:     Active  Right eval Left eval  Hip flexion    Hip extension    Hip abduction    Hip adduction    Hip internal rotation    Hip external rotation    Knee flexion    Knee extension    Ankle dorsiflexion    Ankle plantarflexion    Ankle inversion    Ankle eversion     (Blank rows = not tested)  LOWER EXTREMITY MMT:    MMT Right eval Left eval  Hip flexion    Hip extension    Hip abduction    Hip adduction    Hip internal rotation    Hip external rotation    Knee flexion    Knee extension    Ankle dorsiflexion    Ankle plantarflexion    Ankle inversion    Ankle eversion     (Blank rows = not tested)  LUMBAR SPECIAL TESTS:    FUNCTIONAL TESTS:  DGI without AD: 12/24 5 times Sit <> stand: 31 seconds with B UE assist     B LE weakness felt.     TUG without AD: 13.81 seconds, 11.15 seconds, 10. 25 seconds        GAIT: Distance walked: 60 ft Assistive device utilized: None Level of assistance: SBA Comments: Decreased stance L LE, B pelvic drop  TREATMENT DATE: 08/01/2023  Neuromuscular re education  Sit <> stand from regular chair without UE assist 5x3  Cues for proper foot placement with AFO  Standing on Air Ex Pad with one to 2 UE assist PRN  Staggered stance   Eyes open   R foot forward 30 seconds x 3   L foot forward 30 seconds x 3    Eyes closed   R foot forward  30 seconds x 3   L foot forward 30 seconds x 3   Standing on firm surface with one to 2 UE assist PRN  Staggered stance   Eyes open   R foot forward 30 seconds x 2   L foot forward 30 seconds x 2    Eyes closed   R foot forward 30 seconds x 3   L foot forward 30 seconds x 3   Feet together eyes closed    30 seconds x 3   Backwards walk with sudden stops 30 ft CGA x 3  With addition of PT manual perturbation during static standing balance phases while stopped       Improved exercise technique, movement at target joints, use of target muscles after mod verbal, visual, tactile cues.       PATIENT EDUCATION:  Education details: there-ex, HEP Person educated: Patient Education method: Explanation Education comprehension: verbalized understanding  HOME EXERCISE PROGRAM: Access Code: [email or web address] URL: [email or web address] Date: 06/14/2023 Prepared by: Suzzane Estes  Exercises - Supine Posterior Pelvic Tilt  - 1 x daily - 7 x weekly - 3 sets - 10 reps - 10 seconds hold - Supine March  - 1 x daily - 7 x weekly - 3 sets - 10 reps - Supine Knee and Hip Extension with Resistance  - 1 x daily - 7 x weekly - 3 sets - 10 reps  Blue band - Seated Cervical Retraction  - 3 x daily - 7 x weekly - 3 sets - 10 reps - 5 seconds hold   ASSESSMENT:  CLINICAL IMPRESSION:  Improving ability to maintain static standing balance with eyes open observed, therefore added manual perturbation to challenge balance.  Pt tolerated session well without aggravation of symptoms. Pt will benefit from continued skilled physical therapy services to improve strength, balance, and function.     OBJECTIVE IMPAIRMENTS: Abnormal gait, difficulty walking, improper body mechanics, and postural dysfunction.   ACTIVITY LIMITATIONS: carrying, lifting, standing, squatting, stairs, transfers, and locomotion level  PARTICIPATION LIMITATIONS:   PERSONAL FACTORS: Age, Fitness, Past/current  experiences, Profession, Time since onset of injury/illness/exacerbation, and 1-2 comorbidities: DM, neuropathy are also affecting patient's functional outcome.   REHAB POTENTIAL: Fair    CLINICAL DECISION MAKING: Stable/uncomplicated, (balance has improved with B AFO per pt)  EVALUATION COMPLEXITY: Low   GOALS: Goals reviewed with patient? Yes  SHORT TERM GOALS: Target date: 06/22/2023  Pt will be independent with his initial HEP to improve strength, balance, function.  Baseline: Pt has not yet started his initial HEP (06/12/2023); No questions (07/18/2023) Goal status: MET   LONG TERM GOALS: Target date: 08/10/2023  Pt will improve his 5 times Sit <> stand time from chair with arms to 12 seconds or less without UE assist as a demonstration of improved functional LE strength.  Baseline: 31 seconds with B UE assist (06/12/2023); 15.66 seconds with B UE assist (07/18/2023) Goal status: PROGRESSING  2.  Pt will improve his Dynamic Gait Index Score to at least 19/24 or more as a demonstration of improved balance.  Baseline: DGI without  AD: 12/24 (06/12/2023); 16/24 (07/18/2023) Goal status: PROGRESSING  3.  Pt will improve his (Activities specific Balance Confidence) ABC scale score by at least 10% as a demonstration of improved balance confidence and decreased fall risk.   Baseline: ABC scale score to be obtained next session (06/12/2023); 82.5% (06/25/2023); 85.6% (07/18/2023) Goal status: ONGOING   PLAN:  PT FREQUENCY: 1-2x/week  PT DURATION: 8 weeks  PLANNED INTERVENTIONS: 97110-Therapeutic exercises, 97530- Therapeutic activity, 97112- Neuromuscular re-education, 97535- Self Care, [id number]- Manual therapy, [id number]- Gait training, [phone]- Canalith repositioning, [id number]- Electrical stimulation (unattended), Patient/Family education, Balance training, and Stair training.  PLAN FOR NEXT SESSION: trunk and glute strengthening, balance training, manual techniques, modalities PRN   Ranika Mcniel,  PT, DPT 08/01/2023, 8:58 AM

## 2023-08-01 NOTE — Telephone Encounter (Signed)
 Requested Prescriptions  Pending Prescriptions Disp Refills   metFORMIN  (GLUCOPHAGE ) 500 MG tablet [Pharmacy Med Name: METFORMIN  HCL 500 MG TAB] 270 tablet 1    Sig: TAKE 1 TABLET BY MOUTH THREE TIMES DAILY     Endocrinology:  Diabetes - Biguanides Failed - 08/01/2023  3:49 PM      Failed - Cr in normal range and within 360 days    Creatinine  Date Value Ref Range Status  01/05/2014 0.75 0.60 - 1.30 mg/dL Final   Creatinine, Ser  Date Value Ref Range Status  06/05/2023 0.74 (L) 0.76 - 1.27 mg/dL Final         Passed - HBA1C is between 0 and 7.9 and within 180 days    Hemoglobin A1C  Date Value Ref Range Status  07/21/2015 8.2  Final   HB A1C (BAYER DCA - WAIVED)  Date Value Ref Range Status  06/05/2023 6.0 (H) 4.8 - 5.6 % Final    Comment:             Prediabetes: 5.7 - 6.4          Diabetes: >6.4          Glycemic control for adults with diabetes: <7.0          Passed - eGFR in normal range and within 360 days    EGFR (African American)  Date Value Ref Range Status  01/05/2014 >60 >17mL/min Final   GFR calc Af Amer  Date Value Ref Range Status  03/04/2020 103 >59 mL/min/1.73 Final    Comment:    **In accordance with recommendations from the NKF-ASN Task force,**   [facility] is in the process of updating its eGFR calculation to the   2021 CKD-EPI creatinine equation that estimates kidney function   without a race variable.    EGFR (Non-African Amer.)  Date Value Ref Range Status  01/05/2014 >60 >86mL/min Final    Comment:    eGFR values <13mL/min/1.73 m2 may be an indication of chronic kidney disease (CKD). Calculated eGFR, using the MRDR Study equation, is useful in  patients with stable renal function. The eGFR calculation will not be reliable in acutely ill patients when serum creatinine is changing rapidly. It is not useful in patients on dialysis. The eGFR calculation may not be applicable to patients at the low and high extremes of body sizes,  pregnant women, and vegetarians.    GFR calc non Af Amer  Date Value Ref Range Status  03/04/2020 89 >59 mL/min/1.73 Final   eGFR  Date Value Ref Range Status  06/05/2023 93 >59 mL/min/1.73 Final         Passed - B12 Level in normal range and within 720 days    Vitamin B-12  Date Value Ref Range Status  11/30/2021 852 232 - 1,245 pg/mL Final         Passed - Valid encounter within last 6 months    Recent Outpatient Visits           1 month ago Acute non-recurrent maxillary sinusitis   [facility] [place], Valera T, NP   1 month ago Type 2 diabetes mellitus with diabetic neuropathy, without long-term current use of insulin (HCC)    [facility] Lemar Pyles, NP       Future Appointments             In 5 months Dustin Gimenez, MD [facility]   Passed - CBC within normal limits and completed in the last 12 months    WBC  Date Value Ref Range Status  12/04/2022 5.8 3.4 - 10.8 x10E3/uL Final  01/30/2017 5.3 3.8 - 10.6 K/uL Final   RBC  Date Value Ref Range Status  12/04/2022 4.75 4.14 - 5.80 x10E6/uL Final  01/30/2017 5.16 4.40 - 5.90 MIL/uL Final   Hemoglobin  Date Value Ref Range Status  12/04/2022 13.9 13.0 - 17.7 g/dL Final   Hematocrit  Date Value Ref Range Status  12/04/2022 42.3 37.5 - 51.0 % Final   MCHC  Date Value Ref Range Status  12/04/2022 32.9 31.5 - 35.7 g/dL Final  11/91/4782 95.6 32.0 - 36.0 g/dL Final   [facility]  Date Value Ref Range Status  12/04/2022 29.3 26.6 - 33.0 pg Final  01/30/2017 29.0 26.0 - 34.0 pg Final   MCV  Date Value Ref Range Status  12/04/2022 89 79 - 97 fL Final  01/05/2014 86 80 - 100 fL Final   No results found for: PLTCOUNTKUC, LABPLAT, POCPLA RDW  Date Value Ref Range Status  12/04/2022 12.9 11.6 - 15.4 % Final  01/05/2014 13.4 11.5 - 14.5 % Final

## 2023-08-06 ENCOUNTER — Ambulatory Visit

## 2023-08-06 ENCOUNTER — Encounter: Payer: Self-pay | Admitting: Nurse Practitioner

## 2023-08-06 DIAGNOSIS — R262 Difficulty in walking, not elsewhere classified: Secondary | ICD-10-CM

## 2023-08-06 DIAGNOSIS — M6281 Muscle weakness (generalized): Secondary | ICD-10-CM

## 2023-08-06 DIAGNOSIS — R2681 Unsteadiness on feet: Secondary | ICD-10-CM | POA: Diagnosis not present

## 2023-08-06 DIAGNOSIS — Z9181 History of falling: Secondary | ICD-10-CM | POA: Diagnosis not present

## 2023-08-06 MED ORDER — HYDROCHLOROTHIAZIDE 25 MG PO TABS: 25.0000 mg | ORAL_TABLET | Freq: Every day | ORAL | 3 refills | Status: DC

## 2023-08-06 NOTE — Therapy (Signed)
 OUTPATIENT PHYSICAL THERAPY TREATMENT    Patient Name: Tyler Morgan MRN: [id number] DOB:09/27/1945, 78 y.o., male Today's Date: 08/06/2023  END OF SESSION:  PT End of Session - 08/06/23 0820     Visit Number 16    Number of Visits 17    Date for PT Re-Evaluation 08/10/23    Authorization Type [employer]    PT Start Time 0820    PT Stop Time 0900    PT Time Calculation (min) 40 min    Equipment Utilized During Treatment Gait belt    Activity Tolerance Patient tolerated treatment well    Behavior During Therapy WFL for tasks assessed/performed                        Past Medical History:  Diagnosis Date   Diabetes mellitus without complication (HCC)    ED (erectile dysfunction)    Hypertension    Motion sickness    boats   Neuropathy    Prostate enlargement    Urinary retention    Past Surgical History:  Procedure Laterality Date   BASAL CELL CARCINOMA EXCISION     nose    CATARACT EXTRACTION Bilateral 1986, 2014   COLONOSCOPY WITH PROPOFOL  N/A 03/27/2018   Procedure: COLONOSCOPY WITH BIOPSIES;  Surgeon: Selena Daily, MD;  Location: [facility];  Service: Endoscopy;  Laterality: N/A;  Diabetic - oral meds   EYE SURGERY Bilateral [phone]   FRACTURE SURGERY Right    as a child/ right arm   HERNIA REPAIR Right 01-13-14   inguinal hernia   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 N/A 02/07/2017   Procedure: 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;  Surgeon: Dustin Gimenez, MD;  Location: [facility];  Service: Urology;  Laterality: N/A;   POLYPECTOMY N/A 03/27/2018   Procedure: POLYPECTOMY;  Surgeon: Selena Daily, MD;  Location: [facility];  Service: Endoscopy;  Laterality: N/A;   RETINAL DETACHMENT SURGERY  Jan 2015   TONSILLECTOMY  1955   Patient Active Problem List   Diagnosis Date Noted   Acute non-recurrent maxillary sinusitis 06/21/2023   Diabetes mellitus  treated with oral medication (HCC) 03/06/2023   Dysphagia 03/06/2023   PVD (peripheral vascular disease) (HCC) 11/30/2021   B12 deficiency 01/29/2019   History of prostate cancer 12/13/2017   Hemorrhoids 12/13/2017   Sensorimotor neuropathy 12/13/2017   Hyperlipidemia associated with type 2 diabetes mellitus (HCC) 04/02/2017   Advanced care planning/counseling discussion 10/11/2016   Type 2 diabetes mellitus with diabetic neuropathy (HCC) 12/15/2014   Hypertension associated with diabetes (HCC)     PCP: Lemar Pyles, NP   REFERRING PROVIDER: Baldemar Bond, FNP  REFERRING DIAG: R26.89 (ICD-10-CM) - Balance problem  Rationale for Evaluation and Treatment: Rehabilitation  THERAPY DIAG:  Unsteadiness on feet  Muscle weakness (generalized)  Difficulty in walking, not elsewhere classified  History of falling  ONSET DATE: 03/12/2023  (Date PT referral signed, chronic condition)  SUBJECTIVE:  SUBJECTIVE STATEMENT: Walking and balance are pretty good today. Was bad yesterday morning but it cleared up.     PERTINENT HISTORY:  Balance problem. Got AFOs B feet which helps with ankle DF and gait but limits his ankle movement. The stopping after walking with the AFO's a better but still staggers. Still can't stop and put a camera in front of his face and shoot a picture without wobbling. Also had some pain in his L hand (palmar side) and got a cortisone shot which helped his L hand feel better . The whole L side of his body seems to give him the most problem which makes Dr. Bernetta Brilliant wonder about his diabetic neuropathy diagnosis. Balance might have gotten better since November from the AFOs  Blood pressure is controlled.  No latex allergies  From PT eval on 07/24/2022:   Balance, weakness.  Can get around well without a cane. Standing still, pt loses balance. Has to hold onto things to stabilize himself. Has labradore thats about 78 year old and can walk his dog without a leash because the dog's pulling stabilizes him. Symptoms have been going on for a while and was told to have diabetic neuropathy. Had a fall in 2015 while working on the flooring in his den at home which caused R inguinal hernia S/P mesh placement. Pt states noticing problems in his legs since 2015. Pt also could not push up on his toes (tip toe) since then. No back pain currently but has had a hx of back pain over the years. Decreased sensation B feet, almost not noticeable. Feels like he has a lot of weakness which makes it difficult to keep his balance. Has B LE swelling, comes and goes, not currently getting treated for it. No low back pain, just L arm and shoulder.   PAIN:  Are you having pain? No  PRECAUTIONS: Fall  RED FLAGS: Bowel or bladder incontinence: No and Cauda equina syndrome: No   WEIGHT BEARING RESTRICTIONS: No  FALLS:  Has patient fallen in last 6 months? Yes. Number of falls 1 (pt might have slipped on ice or a wet grass)  LIVING ENVIRONMENT: Lives with: lives with their spouse Lives in: House/apartment Stairs: Yes: External: 1 steps; none Has following equipment at home: Single point cane and Grab bars  OCCUPATION: Conservation officer, historic buildings. Planning on retiring.   PLOF: Mod I.   PATIENT GOALS: Be able to stand still without wobbling.   NEXT MD VISIT: Yes with Dr. Carlis Cherry office Jul 10, 2023  OBJECTIVE:  Note: Objective measures were completed at Evaluation unless otherwise noted.  DIAGNOSTIC FINDINGS:  MR LUMBAR SPINE WO CONTRAST  12/26/2016   Narrative & Impression  CLINICAL DATA:  Lower leg neuropathy for several years. No back pain, acute injury or prior relevant surgery.   EXAM: MRI LUMBAR SPINE WITHOUT CONTRAST   TECHNIQUE: Multiplanar, multisequence MR imaging of the  lumbar spine was performed. No intravenous contrast was administered.   COMPARISON:  None.   FINDINGS: Segmentation: Conventional anatomy assumed, with the last open disc space designated L5-S1.   Alignment: There is 11 mm of anterolisthesis at L5-S1 secondary to chronic bilateral L5 pars defects. The alignment is otherwise normal.   Vertebrae: As above, chronic bilateral L5 pars defects. There is associated endplate degeneration at L5-S1. No evidence of acute fracture or worrisome osseous lesion. The visualized sacroiliac joints appear unremarkable.   Conus medullaris: Extends to the T12-L1 level and appears normal.   Paraspinal and other soft tissues:  No significant paraspinal findings.   Disc levels:   No significant disc space findings from T11-12 through L2-3.   L3-4: Mild loss of disc height with annular disc bulging and endplate osteophytes. Mild facet and ligamentous hypertrophy. There is borderline spinal stenosis and mild narrowing of the lateral recesses. The foramina are patent.   L4-5: Disc height and hydration are maintained. There is mild disc bulging, facet and ligamentous hypertrophy. No significant spinal stenosis or nerve root encroachment.   L5-S1: Bilateral L5 pars defects. There is advanced degenerative disc disease with loss of disc height, disc uncovering and endplate osteophytes. There is at least moderate foraminal narrowing bilaterally with probable encroachment on both exiting L5 nerve roots. The spinal canal and lateral recesses are widely patent.   IMPRESSION: 1. 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 There is probable chronic bilateral L5 nerve root encroachment. 2. Mild disc bulging at L3-4, contributing to mild narrowing of the lateral recesses, but no nerve root encroachment.     Electronically Signed   By: Elmon Hagedorn M.D.   On:  12/26/2016 16:46    PATIENT SURVEYS:    COGNITION: Overall cognitive status: Within functional limits for tasks assessed     SENSATION:   MUSCLE LENGTH:   POSTURE: R shoulder lower, upper thoracic flexion, slight R lateral shift, slight R thoracolumbar convexity,  B knee flexion,   PALPATION:   LUMBAR ROM:   AROM eval  Flexion   Extension   Right lateral flexion   Left lateral flexion   Right rotation   Left rotation    (Blank rows = not tested)  LOWER EXTREMITY ROM:     Active  Right eval Left eval  Hip flexion    Hip extension    Hip abduction    Hip adduction    Hip internal rotation    Hip external rotation    Knee flexion    Knee extension    Ankle dorsiflexion    Ankle plantarflexion    Ankle inversion    Ankle eversion     (Blank rows = not tested)  LOWER EXTREMITY MMT:    MMT Right eval Left eval  Hip flexion    Hip extension    Hip abduction    Hip adduction    Hip internal rotation    Hip external rotation    Knee flexion    Knee extension    Ankle dorsiflexion    Ankle plantarflexion    Ankle inversion    Ankle eversion     (Blank rows = not tested)  LUMBAR SPECIAL TESTS:    FUNCTIONAL TESTS:  DGI without AD: 12/24 5 times Sit <> stand: 31 seconds with B UE assist     B LE weakness felt.     TUG without AD: 13.81 seconds, 11.15 seconds, 10. 25 seconds        GAIT: Distance walked: 60 ft Assistive device utilized: None Level of assistance: SBA Comments: Decreased stance L LE, B pelvic drop  TREATMENT DATE: 08/06/2023  Neuromuscular re education   Sit <> stand from regular chair without UE assist 5x2  Cues for proper foot placement with AFO  14.28 seconds on set 2, no UE assist   Directed patient with gait with normal gait speed, with changes in speed, 180 degree pivot turn, with R and L  cervical rotation position, with cervical flexion and extension position, stepping around obstacles, stepping over an obstacle, ascending and descending 4 regular steps with UE assist   Without AD: DGI score: 20/24  Reviewed progress/current status with PT towards goals.    Standing on Air Ex Pad with one to 2 UE assist PRN  Staggered stance   Eyes open   R foot forward 30 seconds x 3   L foot forward 30 seconds x 3    Eyes closed   R foot forward 30 seconds x 3   L foot forward 30 seconds x 3     Standing on firm surface with one to 2 UE assist PRN  Staggered stance   Eyes closed   R foot forward 30 seconds x 3   L foot forward 30 seconds x 3      Improved exercise technique, movement at target joints, use of target muscles after mod verbal, visual, tactile cues.       PATIENT EDUCATION:  Education details: there-ex, HEP Person educated: Patient Education method: Explanation Education comprehension: verbalized understanding  HOME EXERCISE PROGRAM: Access Code: [email or web address] URL: [email or web address] Date: 06/14/2023 Prepared by: Suzzane Estes  Exercises - Supine Posterior Pelvic Tilt  - 1 x daily - 7 x weekly - 3 sets - 10 reps - 10 seconds hold - Supine March  - 1 x daily - 7 x weekly - 3 sets - 10 reps - Supine Knee and Hip Extension with Resistance  - 1 x daily - 7 x weekly - 3 sets - 10 reps  Blue band - Seated Cervical Retraction  - 3 x daily - 7 x weekly - 3 sets - 10 reps - 5 seconds hold   ASSESSMENT:  CLINICAL IMPRESSION:  Improved functional LE strength and dynamic balance based on improved 5 times sit <> stand times and DGI score. Continued working on static balance with both eyes open and closed to help decrease fall risk.  Pt tolerated session well without aggravation of symptoms. Pt will benefit from continued skilled physical therapy services to improve strength, balance, and function.     OBJECTIVE IMPAIRMENTS: Abnormal gait,  difficulty walking, improper body mechanics, and postural dysfunction.   ACTIVITY LIMITATIONS: carrying, lifting, standing, squatting, stairs, transfers, and locomotion level  PARTICIPATION LIMITATIONS:   PERSONAL FACTORS: Age, Fitness, Past/current experiences, Profession, Time since onset of injury/illness/exacerbation, and 1-2 comorbidities: DM, neuropathy are also affecting patient's functional outcome.   REHAB POTENTIAL: Fair    CLINICAL DECISION MAKING: Stable/uncomplicated, (balance has improved with B AFO per pt)  EVALUATION COMPLEXITY: Low   GOALS: Goals reviewed with patient? Yes  SHORT TERM GOALS: Target date: 06/22/2023  Pt will be independent with his initial HEP to improve strength, balance, function.  Baseline: Pt has not yet started his initial HEP (06/12/2023); No questions (07/18/2023) Goal status: MET   LONG TERM GOALS: Target date: 08/10/2023  Pt will improve his 5 times Sit <> stand time from chair with arms to 12 seconds or less without UE assist as a demonstration of improved functional LE strength.  Baseline: 31 seconds with B UE assist (06/12/2023); 15.66 seconds with B UE assist (07/18/2023);  14.28 seconds without UE assist (08/06/2023) Goal status: PROGRESSING  2.  Pt will improve his Dynamic Gait Index Score to at least 19/24 or more as a demonstration of improved balance.  Baseline: DGI without AD: 12/24 (06/12/2023); 16/24 (07/18/2023); 20/24 (08/06/2023) Goal status: MET    3.  Pt will improve his (Activities specific Balance Confidence) ABC scale score by at least 10% as a demonstration of improved balance confidence and decreased fall risk.   Baseline: ABC scale score to be obtained next session (06/12/2023); 82.5% (06/25/2023); 85.6% (07/18/2023) Goal status: ONGOING   PLAN:  PT FREQUENCY: 1-2x/week  PT DURATION: 8 weeks  PLANNED INTERVENTIONS: 97110-Therapeutic exercises, 97530- Therapeutic activity, 97112- Neuromuscular re-education, 97535- Self  Care, [id number]- Manual therapy, [id number]- Gait training, [phone]- Canalith repositioning, [id number]- Electrical stimulation (unattended), Patient/Family education, Balance training, and Stair training.  PLAN FOR NEXT SESSION: trunk and glute strengthening, balance training, manual techniques, modalities PRN   Gwendolin Briel, PT, DPT 08/06/2023, 10:04 AM

## 2023-08-08 ENCOUNTER — Ambulatory Visit

## 2023-08-08 DIAGNOSIS — Z9181 History of falling: Secondary | ICD-10-CM

## 2023-08-08 DIAGNOSIS — R2681 Unsteadiness on feet: Secondary | ICD-10-CM | POA: Diagnosis not present

## 2023-08-08 DIAGNOSIS — R262 Difficulty in walking, not elsewhere classified: Secondary | ICD-10-CM | POA: Diagnosis not present

## 2023-08-08 DIAGNOSIS — M6281 Muscle weakness (generalized): Secondary | ICD-10-CM

## 2023-08-08 NOTE — Therapy (Signed)
 OUTPATIENT PHYSICAL THERAPY TREATMENT And Discharge Summary    Patient Name: Tyler Morgan MRN: [id number] DOB:06-04-1945, 78 y.o., male Today's Date: 08/08/2023  END OF SESSION:  PT End of Session - 08/08/23 0816     Visit Number 17    Number of Visits 17    Date for PT Re-Evaluation 08/10/23    Authorization Type [employer]    PT Start Time [phone]    PT Stop Time 0856    PT Time Calculation (min) 39 min    Equipment Utilized During Treatment Gait belt    Activity Tolerance Patient tolerated treatment well    Behavior During Therapy WFL for tasks assessed/performed                         Past Medical History:  Diagnosis Date   Diabetes mellitus without complication (HCC)    ED (erectile dysfunction)    Hypertension    Motion sickness    boats   Neuropathy    Prostate enlargement    Urinary retention    Past Surgical History:  Procedure Laterality Date   BASAL CELL CARCINOMA EXCISION     nose    CATARACT EXTRACTION Bilateral 1986, 2014   COLONOSCOPY WITH PROPOFOL  N/A 03/27/2018   Procedure: COLONOSCOPY WITH BIOPSIES;  Surgeon: Selena Daily, MD;  Location: [facility];  Service: Endoscopy;  Laterality: N/A;  Diabetic - oral meds   EYE SURGERY Bilateral [phone]   FRACTURE SURGERY Right    as a child/ right arm   HERNIA REPAIR Right 01-13-14   inguinal hernia   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 N/A 02/07/2017   Procedure: 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;  Surgeon: Dustin Gimenez, MD;  Location: [facility];  Service: Urology;  Laterality: N/A;   POLYPECTOMY N/A 03/27/2018   Procedure: POLYPECTOMY;  Surgeon: Selena Daily, MD;  Location: [facility];  Service: Endoscopy;  Laterality: N/A;   RETINAL DETACHMENT SURGERY  Jan 2015   TONSILLECTOMY  1955   Patient Active Problem List   Diagnosis Date Noted   Acute non-recurrent maxillary sinusitis 06/21/2023    Diabetes mellitus treated with oral medication (HCC) 03/06/2023   Dysphagia 03/06/2023   PVD (peripheral vascular disease) (HCC) 11/30/2021   B12 deficiency 01/29/2019   History of prostate cancer 12/13/2017   Hemorrhoids 12/13/2017   Sensorimotor neuropathy 12/13/2017   Hyperlipidemia associated with type 2 diabetes mellitus (HCC) 04/02/2017   Advanced care planning/counseling discussion 10/11/2016   Type 2 diabetes mellitus with diabetic neuropathy (HCC) 12/15/2014   Hypertension associated with diabetes (HCC)     PCP: Lemar Pyles, NP   REFERRING PROVIDER: Baldemar Bond, FNP  REFERRING DIAG: R26.89 (ICD-10-CM) - Balance problem  Rationale for Evaluation and Treatment: Rehabilitation  THERAPY DIAG:  Unsteadiness on feet  Muscle weakness (generalized)  Difficulty in walking, not elsewhere classified  History of falling  ONSET DATE: 03/12/2023  (Date PT referral signed, chronic condition)  SUBJECTIVE:  SUBJECTIVE STATEMENT: Doing pretty good.     PERTINENT HISTORY:  Balance problem. Got AFOs B feet which helps with ankle DF and gait but limits his ankle movement. The stopping after walking with the AFO's a better but still staggers. Still can't stop and put a camera in front of his face and shoot a picture without wobbling. Also had some pain in his L hand (palmar side) and got a cortisone shot which helped his L hand feel better . The whole L side of his body seems to give him the most problem which makes Dr. Bernetta Brilliant wonder about his diabetic neuropathy diagnosis. Balance might have gotten better since November from the AFOs  Blood pressure is controlled.  No latex allergies  From PT eval on 07/24/2022:   Balance, weakness. Can get around well without a cane. Standing  still, pt loses balance. Has to hold onto things to stabilize himself. Has labradore thats about 78 year old and can walk his dog without a leash because the dog's pulling stabilizes him. Symptoms have been going on for a while and was told to have diabetic neuropathy. Had a fall in 2015 while working on the flooring in his den at home which caused R inguinal hernia S/P mesh placement. Pt states noticing problems in his legs since 2015. Pt also could not push up on his toes (tip toe) since then. No back pain currently but has had a hx of back pain over the years. Decreased sensation B feet, almost not noticeable. Feels like he has a lot of weakness which makes it difficult to keep his balance. Has B LE swelling, comes and goes, not currently getting treated for it. No low back pain, just L arm and shoulder.   PAIN:  Are you having pain? No  PRECAUTIONS: Fall  RED FLAGS: Bowel or bladder incontinence: No and Cauda equina syndrome: No   WEIGHT BEARING RESTRICTIONS: No  FALLS:  Has patient fallen in last 6 months? Yes. Number of falls 1 (pt might have slipped on ice or a wet grass)  LIVING ENVIRONMENT: Lives with: lives with their spouse Lives in: House/apartment Stairs: Yes: External: 1 steps; none Has following equipment at home: Single point cane and Grab bars  OCCUPATION: Conservation officer, historic buildings. Planning on retiring.   PLOF: Mod I.   PATIENT GOALS: Be able to stand still without wobbling.   NEXT MD VISIT: Yes with Dr. Carlis Cherry office Jul 10, 2023  OBJECTIVE:  Note: Objective measures were completed at Evaluation unless otherwise noted.  DIAGNOSTIC FINDINGS:  MR LUMBAR SPINE WO CONTRAST  12/26/2016   Narrative & Impression  CLINICAL DATA:  Lower leg neuropathy for several years. No back pain, acute injury or prior relevant surgery.   EXAM: MRI LUMBAR SPINE WITHOUT CONTRAST   TECHNIQUE: Multiplanar, multisequence MR imaging of the lumbar spine was performed. No intravenous  contrast was administered.   COMPARISON:  None.   FINDINGS: Segmentation: Conventional anatomy assumed, with the last open disc space designated L5-S1.   Alignment: There is 11 mm of anterolisthesis at L5-S1 secondary to chronic bilateral L5 pars defects. The alignment is otherwise normal.   Vertebrae: As above, chronic bilateral L5 pars defects. There is associated endplate degeneration at L5-S1. No evidence of acute fracture or worrisome osseous lesion. The visualized sacroiliac joints appear unremarkable.   Conus medullaris: Extends to the T12-L1 level and appears normal.   Paraspinal and other soft tissues: No significant paraspinal findings.   Disc levels:   No significant  disc space findings from T11-12 through L2-3.   L3-4: Mild loss of disc height with annular disc bulging and endplate osteophytes. Mild facet and ligamentous hypertrophy. There is borderline spinal stenosis and mild narrowing of the lateral recesses. The foramina are patent.   L4-5: Disc height and hydration are maintained. There is mild disc bulging, facet and ligamentous hypertrophy. No significant spinal stenosis or nerve root encroachment.   L5-S1: Bilateral L5 pars defects. There is advanced degenerative disc disease with loss of disc height, disc uncovering and endplate osteophytes. There is at least moderate foraminal narrowing bilaterally with probable encroachment on both exiting L5 nerve roots. The spinal canal and lateral recesses are widely patent.   IMPRESSION: 1. 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 There is probable chronic bilateral L5 nerve root encroachment. 2. Mild disc bulging at L3-4, contributing to mild narrowing of the lateral recesses, but no nerve root encroachment.     Electronically Signed   By: Elmon Hagedorn M.D.   On: 12/26/2016 16:46    PATIENT SURVEYS:     COGNITION: Overall cognitive status: Within functional limits for tasks assessed     SENSATION:   MUSCLE LENGTH:   POSTURE: R shoulder lower, upper thoracic flexion, slight R lateral shift, slight R thoracolumbar convexity,  B knee flexion,   PALPATION:   LUMBAR ROM:   AROM eval  Flexion   Extension   Right lateral flexion   Left lateral flexion   Right rotation   Left rotation    (Blank rows = not tested)  LOWER EXTREMITY ROM:     Active  Right eval Left eval  Hip flexion    Hip extension    Hip abduction    Hip adduction    Hip internal rotation    Hip external rotation    Knee flexion    Knee extension    Ankle dorsiflexion    Ankle plantarflexion    Ankle inversion    Ankle eversion     (Blank rows = not tested)  LOWER EXTREMITY MMT:    MMT Right eval Left eval  Hip flexion    Hip extension    Hip abduction    Hip adduction    Hip internal rotation    Hip external rotation    Knee flexion    Knee extension    Ankle dorsiflexion    Ankle plantarflexion    Ankle inversion    Ankle eversion     (Blank rows = not tested)  LUMBAR SPECIAL TESTS:    FUNCTIONAL TESTS:  DGI without AD: 12/24 5 times Sit <> stand: 31 seconds with B UE assist     B LE weakness felt.     TUG without AD: 13.81 seconds, 11.15 seconds, 10. 25 seconds        GAIT: Distance walked: 60 ft Assistive device utilized: None Level of assistance: SBA Comments: Decreased stance L LE, B pelvic drop  TREATMENT DATE: 08/08/2023  Neuromuscular re education  Sit <> stand from regular chair without UE assist 5x2  Cues for proper foot placement with AFO  11.60 seconds on set 2, no UE assist  Backwards walk 30 ft with multiple stops 4x  Standing on Air Ex Pad with one to 2 UE assist PRN  Staggered stance   Eyes open   R foot forward 30  seconds x 3   L foot forward 30 seconds x 3    Eyes closed   R foot forward 30 seconds x 3   L foot forward 30 seconds x 3     Standing on firm surface with one to 2 UE assist PRN  Staggered stance   Eyes closed   R foot forward 30 seconds x 3   L foot forward 30 seconds x 3   Ambulation outside on incline and decline uneven surface 30 ft each 3x with sudden stops at times  SLS with contralateral UE assist   Eyes open L 30 seconds x 3  R 30 seconds x 3    Eyes closed L 30 seconds x 2  R 30 seconds x 2   Improved exercise technique, movement at target joints, use of target muscles after mod verbal, visual, tactile cues.       PATIENT EDUCATION:  Education details: there-ex, HEP Person educated: Patient Education method: Explanation Education comprehension: verbalized understanding  HOME EXERCISE PROGRAM: Access Code: [email or web address] URL: [email or web address] Date: 06/14/2023 Prepared by: Suzzane Estes  Exercises - Supine Posterior Pelvic Tilt  - 1 x daily - 7 x weekly - 3 sets - 10 reps - 10 seconds hold - Supine March  - 1 x daily - 7 x weekly - 3 sets - 10 reps - Supine Knee and Hip Extension with Resistance  - 1 x daily - 7 x weekly - 3 sets - 10 reps  Blue band - Seated Cervical Retraction  - 3 x daily - 7 x weekly - 3 sets - 10 reps - 5 seconds hold  - Standing Balance with Eyes Open  - 1 x daily - 7 x weekly - 1 sets - 3 reps - 30 seconds hold - Standing Balance with Eyes Closed on Foam  - 1 x daily - 7 x weekly - 1 sets - 3 reps - 30 seconds hold - Wide Tandem Stance on Foam Pad with Eyes Open  - 1 x daily - 7 x weekly - 1 sets - 3 reps - 30 seconds hold - Wide Tandem Stance on Foam Pad with Eyes Closed  - 1 x daily - 7 x weekly - 1 sets - 3 reps - 30 seconds hold   ASSESSMENT:  CLINICAL IMPRESSION:  Pt demonstrates improved functional LE strength and dynamic balance based on improved 5 times sit <> stand times and DGI score since initial  evaluation. Pt also demonstrates improved ability to maintain static standing balance overall observed in the clinic. Pt has made very good progress with PT towards goals. Skilled physical therapy services discharged with pt continuing progress with his exercises at home.      OBJECTIVE IMPAIRMENTS: Abnormal gait, difficulty walking, improper body mechanics, and postural dysfunction.   ACTIVITY LIMITATIONS: carrying, lifting, standing, squatting, stairs, transfers, and locomotion level  PARTICIPATION LIMITATIONS:   PERSONAL FACTORS: Age, Fitness, Past/current experiences, Profession, Time since onset of injury/illness/exacerbation, and 1-2 comorbidities: DM, neuropathy are also affecting patient's functional outcome.   REHAB POTENTIAL: Fair    CLINICAL DECISION MAKING: Stable/uncomplicated, (  balance has improved with B AFO per pt)  EVALUATION COMPLEXITY: Low   GOALS: Goals reviewed with patient? Yes  SHORT TERM GOALS: Target date: 06/22/2023  Pt will be independent with his initial HEP to improve strength, balance, function.  Baseline: Pt has not yet started his initial HEP (06/12/2023); No questions (07/18/2023) Goal status: MET   LONG TERM GOALS: Target date: 08/10/2023  Pt will improve his 5 times Sit <> stand time from chair with arms to 12 seconds or less without UE assist as a demonstration of improved functional LE strength.  Baseline: 31 seconds with B UE assist (06/12/2023); 15.66 seconds with B UE assist (07/18/2023); 14.28 seconds without UE assist (08/06/2023); 11.60 seconds without UE assist (08/08/2023) Goal status: MET  2.  Pt will improve his Dynamic Gait Index Score to at least 19/24 or more as a demonstration of improved balance.  Baseline: DGI without AD: 12/24 (06/12/2023); 16/24 (07/18/2023); 20/24 (08/06/2023) Goal status: MET    3.  Pt will improve his (Activities specific Balance Confidence) ABC scale score by at least 10% as a demonstration of improved balance  confidence and decreased fall risk.   Baseline: ABC scale score to be obtained next session (06/12/2023); 82.5% (06/25/2023); 85.6% (07/18/2023); 90.6 % (08/08/2023) Goal status: Partially met   PLAN:  PT FREQUENCY: 1-2x/week  PT DURATION: 8 weeks  PLANNED INTERVENTIONS: 97110-Therapeutic exercises, 97530- Therapeutic activity, 97112- Neuromuscular re-education, 97535- Self Care, 16109- Manual therapy, [id number]- Gait training, [phone]- Canalith repositioning, U9811- Electrical stimulation (unattended), Patient/Family education, Balance training, and Stair training.  PLAN FOR NEXT SESSION: trunk and glute strengthening, balance training, manual techniques, modalities PRN  Thank you for your referral.   Roseana Rhine, PT, DPT 08/08/2023, 4:36 PM

## 2023-08-14 ENCOUNTER — Other Ambulatory Visit: Payer: Self-pay

## 2023-08-14 ENCOUNTER — Telehealth: Payer: Self-pay

## 2023-08-14 DIAGNOSIS — Z860100 Personal history of colon polyps, unspecified: Secondary | ICD-10-CM

## 2023-08-14 MED ORDER — NA SULFATE-K SULFATE-MG SULF 17.5-3.13-1.6 GM/177ML PO SOLN: 1.0000 | Freq: Once | ORAL | 0 refills | Status: AC

## 2023-08-14 NOTE — Telephone Encounter (Signed)
 Gastroenterology Pre-Procedure Review  Request Date: 10/02/23 Requesting Physician: Dr. Jinny  PATIENT REVIEW QUESTIONS: The patient responded to the following health history questions as indicated:    1. Are you having any GI issues? Hemorrhoids and also is noted on previous colonoscopy performed with Dr. Unk 03/27/2018 2. Do you have a personal history of Polyps? yes (polyps noted on previous colonoscopy performed by Dr. Unk 03/27/2018 recommended repeat in 5 years) 3. Do you have a family history of Colon Cancer or Polyps? yes (brother and father had colon cancer) 4. Diabetes Mellitus? yes (patient currently taking Metformin .  He has been advised verbally and noted on instructions to stop 2 days prior to his colonoscopy) 5. Joint replacements in the past 12 months?no 6. Major health problems in the past 3 months?no 7. Any artificial heart valves, MVP, or defibrillator?no    MEDICATIONS & ALLERGIES:    Patient reports the following regarding taking any anticoagulation/antiplatelet therapy:   Plavix, Coumadin, Eliquis, Xarelto, Lovenox, Pradaxa, Brilinta, or Effient? no Aspirin? yes (81 mg daily)  Patient confirms/reports the following medications:  Current Outpatient Medications  Medication Sig Dispense Refill   hydrochlorothiazide  (HYDRODIURIL ) 25 MG tablet Take 1 tablet (25 mg total) by mouth daily. 90 tablet 3   Alpha-Lipoic Acid 50 MG TABS Take by mouth.     aspirin EC 81 MG tablet Take 81 mg by mouth daily. Swallow whole.     atenolol  (TENORMIN ) 50 MG tablet Take 1 tablet (50 mg total) by mouth daily. 90 tablet 4   benazepril  (LOTENSIN ) 40 MG tablet Take 1 tablet (40 mg total) by mouth daily. 90 tablet 4   metFORMIN  (GLUCOPHAGE ) 500 MG tablet TAKE 1 TABLET BY MOUTH THREE TIMES DAILY 270 tablet 1   rosuvastatin  (CRESTOR ) 10 MG tablet TAKE ONE TABLET BY MOUTH 3 TIMES PER WEEK 12 tablet 4   vitamin B-12 (CYANOCOBALAMIN ) 1000 MCG tablet Take 1,000 mcg by mouth daily.     VITAMIN  D PO Take 500 Units by mouth daily.     No current facility-administered medications for this visit.    Patient confirms/reports the following allergies:  Allergies  Allergen Reactions   Acetaminophen Swelling and Other (See Comments)    Swelling of liver    Amlodipine Swelling    No orders of the defined types were placed in this encounter.   AUTHORIZATION INFORMATION Primary Insurance: 1D#: Group #:  Secondary Insurance: 1D#: Group #:  SCHEDULE INFORMATION: Date: 10/02/23 Time: Location: [facility]

## 2023-08-29 ENCOUNTER — Ambulatory Visit: Admitting: Dermatology

## 2023-08-29 ENCOUNTER — Encounter: Payer: Self-pay | Admitting: Dermatology

## 2023-08-29 DIAGNOSIS — L989 Disorder of the skin and subcutaneous tissue, unspecified: Secondary | ICD-10-CM

## 2023-08-29 DIAGNOSIS — L720 Epidermal cyst: Secondary | ICD-10-CM | POA: Diagnosis not present

## 2023-08-29 DIAGNOSIS — L723 Sebaceous cyst: Secondary | ICD-10-CM

## 2023-08-29 MED ORDER — DOXYCYCLINE MONOHYDRATE 100 MG PO CAPS: 100.0000 mg | ORAL_CAPSULE | Freq: Two times a day (BID) | ORAL | 0 refills | Status: AC

## 2023-08-29 MED ORDER — MUPIROCIN 2 % EX OINT: 1.0000 | TOPICAL_OINTMENT | Freq: Two times a day (BID) | CUTANEOUS | 0 refills | Status: DC

## 2023-08-29 NOTE — Progress Notes (Signed)
   Follow-Up Visit   Subjective  Tyler Morgan is a 78 y.o. male who presents for the following: cyst R post shoulder, inflamed over weekend, pain, drainage, no treatment   The following portions of the chart were reviewed this encounter and updated as appropriate: medications, allergies, medical history  Review of Systems:  No other skin or systemic complaints except as noted in HPI or Assessment and Plan.  Objective  Well appearing patient in no apparent distress; mood and affect are within normal limits.    A focused examination was performed of the following areas: back  Relevant exam findings are noted in the Assessment and Plan.  R spinal upper back Erythematous fluctuant sq nodule 3.5cm R spinal upper back  Assessment & Plan   INFLAMED EPIDERMOID CYST OF SKIN R spinal upper back Vs Abcess Start Doxycycline  100mg  1 po bid with food and drink for 2 wks Start Mupirocin  oint bid to cyst until healed I&D today, bacterial culture obtained  Doxycycline  should be taken with food to prevent nausea. Do not lay down for 30 minutes after taking. Be cautious with sun exposure and use good sun protection while on this medication. Pregnant women should not take this medication.   Incision and Drainage - R spinal upper back Location: R spinal upper back  Informed Consent: Discussed risks (permanent scarring, light or dark discoloration, infection, pain, bleeding, bruising, redness, damage to adjacent structures, and recurrence of the lesion) and benefits of the procedure, as well as the alternatives.  Informed consent was obtained.  Preparation: The area was prepped with alcohol.  Anesthesia: Lidocaine  1% without epinephrine  Procedure Details: An incision was made overlying the lesion. The lesion drained pus and blood.  A large amount of fluid was drained.   Irrigation and curettage performed. Antibiotic ointment and a sterile pressure dressing were applied. The patient  tolerated procedure well.  Total number of lesions drained: 1  Plan: The patient was instructed on post-op care. Recommend OTC analgesia as needed for pain.   Aerobic culture - R spinal upper back  Return if symptoms worsen or fail to improve.  I, Grayce Saunas, RMA, am acting as scribe for Rexene Rattler, MD .   Documentation: I have reviewed the above documentation for accuracy and completeness, and I agree with the above.  Rexene Rattler, MD

## 2023-08-29 NOTE — Patient Instructions (Signed)

## 2023-09-01 LAB — AEROBIC CULTURE

## 2023-09-03 ENCOUNTER — Ambulatory Visit: Payer: Self-pay | Admitting: Dermatology

## 2023-09-03 NOTE — Telephone Encounter (Signed)
-----   Message from Rexene Rattler sent at 09/03/2023 10:40 AM EDT ----- Bacterial culture negative - please call patient ----- Message ----- From: Rebecka Memos Lab Results In Sent: 09/01/2023  11:35 AM EDT To: Rexene Rattler, MD

## 2023-09-03 NOTE — Telephone Encounter (Signed)
 Advised pt of culture results.  Patient would like area to be rechecked to see if he will need surgery to excise.  If there is still a cyst he would like removed.  Scheduled pt for recheck./sh

## 2023-09-04 ENCOUNTER — Ambulatory Visit: Admitting: Nurse Practitioner

## 2023-09-04 ENCOUNTER — Ambulatory Visit: Admitting: Dermatology

## 2023-09-26 ENCOUNTER — Encounter: Payer: Self-pay | Admitting: Nurse Practitioner

## 2023-09-26 ENCOUNTER — Telehealth: Payer: Self-pay

## 2023-09-26 NOTE — Telephone Encounter (Signed)
 Pt called to cancel upcoming colonoscopy due to the upfront cost... He does not wish to reschedule at this time... Endo notified

## 2023-10-02 ENCOUNTER — Ambulatory Visit: Admission: RE | Admit: 2023-10-02 | Source: Home / Self Care | Admitting: Gastroenterology

## 2023-10-02 ENCOUNTER — Other Ambulatory Visit: Payer: Self-pay | Admitting: Nurse Practitioner

## 2023-10-02 SURGERY — COLONOSCOPY
Anesthesia: General

## 2023-10-05 NOTE — Telephone Encounter (Signed)
 Too soon for refill, LRF 07/04/23 for 30 and  RF.  Requested Prescriptions  Pending Prescriptions Disp Refills   rosuvastatin  (CRESTOR ) 10 MG tablet [Pharmacy Med Name: ROSUVASTATIN  CALCIUM  10 MG TAB] 12 tablet 4    Sig: TAKE ONE TABLET BY MOUTH 3 TIMES PER WEEK     Cardiovascular:  Antilipid - Statins 2 Failed - 10/05/2023 11:47 AM      Failed - Cr in normal range and within 360 days    Creatinine  Date Value Ref Range Status  01/05/2014 0.75 0.60 - 1.30 mg/dL Final   Creatinine, Ser  Date Value Ref Range Status  06/05/2023 0.74 (L) 0.76 - 1.27 mg/dL Final         Failed - Lipid Panel in normal range within the last 12 months    Cholesterol, Total  Date Value Ref Range Status  06/05/2023 156 100 - 199 mg/dL Final   Cholesterol Piccolo, Waived  Date Value Ref Range Status  07/30/2018 131 <200 mg/dL Final    Comment:                            Desirable                <200                         Borderline High      200- 239                         High                     >239    LDL Chol Calc (NIH)  Date Value Ref Range Status  06/05/2023 67 0 - 99 mg/dL Final   HDL  Date Value Ref Range Status  06/05/2023 48 >39 mg/dL Final   Triglycerides  Date Value Ref Range Status  06/05/2023 256 (H) 0 - 149 mg/dL Final   Triglycerides Piccolo,Waived  Date Value Ref Range Status  07/30/2018 121 <150 mg/dL Final    Comment:                            Normal                   <150                         Borderline High     150 - 199                         High                200 - 499                         Very High                >499          Passed - Patient is not pregnant      Passed - Valid encounter within last 12 months    Recent Outpatient Visits           3 months ago Acute non-recurrent maxillary sinusitis   [place] [facility] Lyndon, Stinnett T,  NP   4 months ago Type 2 diabetes mellitus with diabetic neuropathy, without long-term  current use of insulin (HCC)   [place] [facility] Gilmer, Melanie DASEN, NP       Future Appointments             In 1 month Jackquline Sawyer, MD [facility]

## 2023-10-06 NOTE — Patient Instructions (Signed)
 Healthy Eating, Adult Healthy eating may help you get and keep a healthy body weight, reduce the risk of chronic disease, and live a long and productive life. It is important to follow a healthy eating pattern. Your nutritional and calorie needs should be met mainly by different nutrient-rich foods. What are tips for following this plan? Reading food labels Read labels and choose the following: Reduced or low sodium products. Juices with 100% fruit juice. Foods with low saturated fats (<3 g per serving) and high polyunsaturated and monounsaturated fats. Foods with whole grains, such as whole wheat, cracked wheat, brown rice, and wild rice. Whole grains that are fortified with folic acid . This is recommended for females who are pregnant or who want to become pregnant. Read labels and do not eat or drink the following: Foods or drinks with added sugars. These include foods that contain brown sugar, corn sweetener, corn syrup, dextrose , fructose, glucose, high-fructose corn syrup, honey, invert sugar, lactose, malt syrup, maltose, molasses, raw sugar, sucrose, trehalose, or turbinado sugar. Limit your intake of added sugars to less than 10% of your total daily calories. Do not eat more than the following amounts of added sugar per day: 6 teaspoons (25 g) for females. 9 teaspoons (38 g) for males. Foods that contain processed or refined starches and grains. Refined grain products, such as white flour, degermed cornmeal, white bread, and white rice. Shopping Choose nutrient-rich snacks, such as vegetables, whole fruits, and nuts. Avoid high-calorie and high-sugar snacks, such as potato chips, fruit snacks, and candy. Use oil-based dressings and spreads on foods instead of solid fats such as butter, margarine, sour cream, or cream cheese. Limit pre-made sauces, mixes, and instant products such as flavored rice, instant noodles, and ready-made pasta. Try more plant-protein sources, such as tofu,  tempeh, black beans, edamame, lentils, nuts, and seeds. Explore eating plans such as the Mediterranean diet or vegetarian diet. Try heart-healthy dips made with beans and healthy fats like hummus and guacamole. Vegetables go great with these. Cooking Use oil to saut or stir-fry foods instead of solid fats such as butter, margarine, or lard. Try baking, boiling, grilling, or broiling instead of frying. Remove the fatty part of meats before cooking. Steam vegetables in water  or broth. Meal planning  At meals, imagine dividing your plate into fourths: One-half of your plate is fruits and vegetables. One-fourth of your plate is whole grains. One-fourth of your plate is protein, especially lean meats, poultry, eggs, tofu, beans, or nuts. Include low-fat dairy as part of your daily diet. Lifestyle Choose healthy options in all settings, including home, work, school, restaurants, or stores. Prepare your food safely: Wash your hands after handling raw meats. Where you prepare food, keep surfaces clean by regularly washing with hot, soapy water . Keep raw meats separate from ready-to-eat foods, such as fruits and vegetables. Cook seafood, meat, poultry, and eggs to the recommended temperature. Get a food thermometer. Store foods at safe temperatures. In general: Keep cold foods at 34F (4.4C) or below. Keep hot foods at 134F (60C) or above. Keep your freezer at [facility] (-17.8C) or below. Foods are not safe to eat if they have been between the temperatures of 40-134F (4.4-60C) for more than 2 hours. What foods should I eat? Fruits Aim to eat 1-2 cups of fresh, canned (in natural juice), or frozen fruits each day. One cup of fruit equals 1 small apple, 1 large banana, 8 large strawberries, 1 cup (237 g) canned fruit,  cup (82 g) dried fruit,  or 1 cup (240 mL) 100% juice. Vegetables Aim to eat 2-4 cups of fresh and frozen vegetables each day, including different varieties and colors. One cup  of vegetables equals 1 cup (91 g) broccoli or cauliflower florets, 2 medium carrots, 2 cups (150 g) raw, leafy greens, 1 large tomato, 1 large bell pepper, 1 large sweet potato, or 1 medium white potato. Grains Aim to eat 5-10 ounce-equivalents of whole grains each day. Examples of 1 ounce-equivalent of grains include 1 slice of bread, 1 cup (40 g) ready-to-eat cereal, 3 cups (24 g) popcorn, or  cup (93 g) cooked rice. Meats and other proteins Try to eat 5-7 ounce-equivalents of protein each day. Examples of 1 ounce-equivalent of protein include 1 egg,  oz nuts (12 almonds, 24 pistachios, or 7 walnut halves), 1/4 cup (90 g) cooked beans, 6 tablespoons (90 g) hummus or 1 tablespoon (16 g) peanut butter. A cut of meat or fish that is the size of a deck of cards is about 3-4 ounce-equivalents (85 g). Of the protein you eat each week, try to have at least 8 sounce (227 g) of seafood. This is about 2 servings per week. This includes salmon, trout, herring, sardines, and anchovies. Dairy Aim to eat 3 cup-equivalents of fat-free or low-fat dairy each day. Examples of 1 cup-equivalent of dairy include 1 cup (240 mL) milk, 8 ounces (250 g) yogurt, 1 ounces (44 g) natural cheese, or 1 cup (240 mL) fortified soy milk. Fats and oils Aim for about 5 teaspoons (21 g) of fats and oils per day. Choose monounsaturated fats, such as canola and olive oils, mayonnaise made with olive oil or avocado oil, avocados, peanut butter, and most nuts, or polyunsaturated fats, such as sunflower, corn, and soybean oils, walnuts, pine nuts, sesame seeds, sunflower seeds, and flaxseed. Beverages Aim for 6 eight-ounce glasses of water  per day. Limit coffee to 3-5 eight-ounce cups per day. Limit caffeinated beverages that have added calories, such as soda and energy drinks. If you drink alcohol: Limit how much you have to: 0-1 drink a day if you are male. 0-2 drinks a day if you are male. Know how much alcohol is in your drink.  In the [place] one drink is one 12 oz bottle of beer (355 mL), one 5 oz glass of wine (148 mL), or one 1 oz glass of hard liquor (44 mL). Seasoning and other foods Try not to add too much salt to your food. Try using herbs and spices instead of salt. Try not to add sugar to food. This information is based on [place] nutrition guidelines. To learn more, visit [employer]. Exact amounts may vary. You may need different amounts. This information is not intended to replace advice given to you by your health care provider. Make sure you discuss any questions you have with your health care provider. Document Revised: 11/07/2021 Document Reviewed: 11/07/2021 Elsevier Patient Education  2024 [employer].

## 2023-10-10 ENCOUNTER — Ambulatory Visit (INDEPENDENT_AMBULATORY_CARE_PROVIDER_SITE_OTHER): Admitting: Nurse Practitioner

## 2023-10-10 VITALS — BP 102/60 | HR 71 | Temp 97.9°F | Ht 72.0 in | Wt 175.8 lb

## 2023-10-10 DIAGNOSIS — E785 Hyperlipidemia, unspecified: Secondary | ICD-10-CM

## 2023-10-10 DIAGNOSIS — E119 Type 2 diabetes mellitus without complications: Secondary | ICD-10-CM

## 2023-10-10 DIAGNOSIS — I152 Hypertension secondary to endocrine disorders: Secondary | ICD-10-CM | POA: Diagnosis not present

## 2023-10-10 DIAGNOSIS — E1159 Type 2 diabetes mellitus with other circulatory complications: Secondary | ICD-10-CM | POA: Diagnosis not present

## 2023-10-10 DIAGNOSIS — E1169 Type 2 diabetes mellitus with other specified complication: Secondary | ICD-10-CM

## 2023-10-10 DIAGNOSIS — Z7984 Long term (current) use of oral hypoglycemic drugs: Secondary | ICD-10-CM | POA: Diagnosis not present

## 2023-10-10 DIAGNOSIS — E538 Deficiency of other specified B group vitamins: Secondary | ICD-10-CM | POA: Diagnosis not present

## 2023-10-10 DIAGNOSIS — I739 Peripheral vascular disease, unspecified: Secondary | ICD-10-CM

## 2023-10-10 DIAGNOSIS — E1140 Type 2 diabetes mellitus with diabetic neuropathy, unspecified: Secondary | ICD-10-CM

## 2023-10-10 LAB — BAYER DCA HB A1C WAIVED: HB A1C (BAYER DCA - WAIVED): 6.7 % — ABNORMAL HIGH (ref 4.8–5.6)

## 2023-10-10 MED ORDER — BENAZEPRIL HCL 20 MG PO TABS: 20.0000 mg | ORAL_TABLET | Freq: Every day | ORAL | 1 refills | Status: DC

## 2023-10-10 NOTE — Assessment & Plan Note (Signed)
 Ongoing, stable on recent check.  B12 check today, continue supplement daily.

## 2023-10-10 NOTE — Assessment & Plan Note (Signed)
 Chronic, ongoing with A1c 6.7% today, trend up from 6%.  Urine ALB 30 in January 2025.  Continue Metformin  TID.  Recommend he check BS at least three mornings a week and document.  If elevation above goal A1c next visit then may need to restart Jardiance .  Continue collaboration with neurology, recent notes reviewed.  Return in 3 months. - ACE and statin on board - Eye and foot exams up to date - Vaccinations up to date

## 2023-10-10 NOTE — Assessment & Plan Note (Signed)
Chronic, ongoing.  Myalgia presenting with daily Atorvastatin in past.  Continue Rosuvastatin 10 MG three days a week, educated him on this.  If myalgia with 3 days a week dosing then will trial one day a week, if ongoing myalgia then stop and consider Zetia or injectable.

## 2023-10-10 NOTE — Assessment & Plan Note (Signed)
 Refer to diabetes with neuropathy plan of care

## 2023-10-10 NOTE — Assessment & Plan Note (Signed)
 Suspect some underlying PVD based on exam with diminished lower extremity hair pattern, edema at ankles, and dry skin.  At this time monitor for wounds and continue frequent foot exams.  To notify provider if any wounds present or pain. May benefit visit with vascular in future.

## 2023-10-10 NOTE — Assessment & Plan Note (Signed)
 Chronic, stable.  BP on lower side today and on home checks  Urine ALB 22 March 2023.  Reduce Benazepril  to 20 MG daily and continue remainder of medication regimen. Recommend checking BP a few mornings a week at home and documenting + focus on DASH diet.  Monitor for worsening edema and may provide PRN Lasix  if presents, suspect some underlying PVD based on exam.  Recommend avoid Ibuprofen products and high salt intake.  Return in 3 months.  Labs today: CMP.

## 2023-10-10 NOTE — Progress Notes (Signed)
 BP 102/60 (BP Location: Left Arm, Patient Position: Sitting)   Pulse 71   Temp 97.9 F (36.6 C) (Oral)   Ht 6' (1.829 m)   Wt 175 lb 12.8 oz (79.7 kg)   SpO2 97%   BMI 23.84 kg/m    Subjective:    Patient ID: Tyler Morgan, male    DOB: [birth year], 78 y.o.   MRN: [id number]  HPI: Tyler Morgan is a 78 y.o. male  Chief Complaint  Patient presents with   Diabetes   Hyperlipidemia   Hypertension   DIABETES April A1c 6%.  Continues Metformin  500 MG TID.  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 Takes Alpha Lipoic Acid and B12 daily. Saw neurology on 07/10/23, injection to left wrist for carpal tunnel.   Hypoglycemic episodes:no Polydipsia/polyuria: no Visual disturbance: no Chest pain: no Paresthesias: no Glucose Monitoring: no  Accucheck frequency: Not Checking  Fasting glucose:  Post prandial:  Evening:  Before meals: Taking Insulin?: no  Long acting insulin:  Short acting insulin: Blood Pressure Monitoring: rarely Retinal Examination: Not Up To Date - [facility] - scheduled tomorrow Foot Exam: Up to Date Pneumovax: Up to Date Influenza: Up to Date Aspirin: no   HYPERTENSION / HYPERLIPIDEMIA Continues Benazepril , HCTZ, Atenolol , + Crestor .   Satisfied with current treatment? yes Duration of hypertension: chronic BP monitoring frequency: sporadically BP range: 90-100/60 BP medication side effects: no Duration of hyperlipidemia: chronic Cholesterol medication side effects: no Cholesterol supplements: none Medication compliance: good compliance Aspirin: no Recent stressors: no Recurrent headaches: no Visual changes: no Palpitations: no Dyspnea: no Chest pain: no Lower extremity edema: no Dizzy/lightheaded: no      10/10/2023    1:22 PM 03/06/2023    4:13 PM 12/04/2022    4:04 PM 09/01/2022    9:36 AM 06/02/2022    3:54 PM  Depression screen PHQ 2/9  Decreased Interest 0 0 0 0 0  Down, Depressed,  Hopeless 0 0 0 0 0  PHQ - 2 Score 0 0 0 0 0  Altered sleeping 0 0 0 0 0  Tired, decreased energy 0 0 0 0 0  Change in appetite 0 0 0 0 0  Feeling bad or failure about yourself  0 0 0 0 0  Trouble concentrating 0 0 0 0 0  Moving slowly or fidgety/restless 0 0 0 0 0  Suicidal thoughts 0 0 0 0 0  PHQ-9 Score 0 0 0 0 0  Difficult doing work/chores Not difficult at all Not difficult at all   Not difficult at all       10/10/2023    1:22 PM 03/06/2023    4:13 PM 12/04/2022    4:04 PM 09/01/2022    9:37 AM  GAD 7 : Generalized Anxiety Score  Nervous, Anxious, on Edge 0 0 0 0  Control/stop worrying 0 0 0 0  Worry too much - different things 0 0 0 0  Trouble relaxing 0 0 0 0  Restless 0 0 0 0  Easily annoyed or irritable 0 0 0 0  Afraid - awful might happen 0 0 0 0  Total GAD 7 Score 0 0 0 0  Anxiety Difficulty Not difficult at all Not difficult at all     Relevant past medical, surgical, family and social history reviewed and updated as indicated. Interim medical history since our last visit reviewed. Allergies and medications reviewed and updated.  Review of Systems  Constitutional:  Negative for  activity change, appetite change, diaphoresis, fatigue, fever and unexpected weight change.  Respiratory:  Negative for cough, chest tightness, shortness of breath and wheezing.   Cardiovascular:  Negative for chest pain, palpitations and leg swelling.  Gastrointestinal:  Negative for abdominal distention, abdominal pain, constipation, diarrhea, nausea and vomiting.  Endocrine: Negative for cold intolerance, heat intolerance, polydipsia, polyphagia and polyuria.  Neurological: Negative.   Psychiatric/Behavioral: Negative.     Per HPI unless specifically indicated above     Objective:    BP 102/60 (BP Location: Left Arm, Patient Position: Sitting)   Pulse 71   Temp 97.9 F (36.6 C) (Oral)   Ht 6' (1.829 m)   Wt 175 lb 12.8 oz (79.7 kg)   SpO2 97%   BMI 23.84 kg/m   Wt Readings  from Last 3 Encounters:  10/10/23 175 lb 12.8 oz (79.7 kg)  06/21/23 175 lb (79.4 kg)  06/05/23 172 lb 12.8 oz (78.4 kg)    Physical Exam Vitals and nursing note reviewed.  Constitutional:      General: He is awake. He is not in acute distress.    Appearance: He is well-developed and well-groomed. He is not ill-appearing or toxic-appearing.  HENT:     Head: Normocephalic.     Right Ear: Hearing and external ear normal.     Left Ear: Hearing and external ear normal.  Eyes:     General: Lids are normal.     Extraocular Movements: Extraocular movements intact.     Conjunctiva/sclera: Conjunctivae normal.  Neck:     Thyroid : No thyromegaly.     Vascular: No carotid bruit.  Cardiovascular:     Rate and Rhythm: Normal rate and regular rhythm.     Heart sounds: Normal heart sounds. No murmur heard.    No gallop.     Comments: Dependent rubor bilateral lower extremities and reduced hair pattern. Pulmonary:     Effort: No accessory muscle usage or respiratory distress.     Breath sounds: Normal breath sounds.  Abdominal:     General: Bowel sounds are normal. There is no distension.     Palpations: Abdomen is soft.     Tenderness: There is no abdominal tenderness.  Musculoskeletal:     Cervical back: Full passive range of motion without pain.     Right lower leg: 1+ Edema present.     Left lower leg: 1+ Edema present.  Lymphadenopathy:     Cervical: No cervical adenopathy.  Skin:    General: Skin is warm.     Capillary Refill: Capillary refill takes less than 2 seconds.  Neurological:     Mental Status: He is alert and oriented to person, place, and time.     Deep Tendon Reflexes: Reflexes are normal and symmetric.     Reflex Scores:      Brachioradialis reflexes are 2+ on the right side and 2+ on the left side.      Patellar reflexes are 2+ on the right side and 2+ on the left side. Psychiatric:        Attention and Perception: Attention normal.        Mood and Affect: Mood  normal.        Speech: Speech normal.        Behavior: Behavior normal. Behavior is cooperative.        Thought Content: Thought content normal.    Results for orders placed or performed in visit on 08/29/23  Aerobic culture   Collection Time:  08/29/23 10:55 AM   Specimen: Cyst; Abscess   Abscess  Release to pa  Result Value Ref Range   Aerobic Bacterial Culture Final report    Organism ID, Bacteria Mixed skin flora       Assessment & Plan:   Problem List Items Addressed This Visit       Cardiovascular and Mediastinum   PVD (peripheral vascular disease) (HCC)   Suspect some underlying PVD based on exam with diminished lower extremity hair pattern, edema at ankles, and dry skin.  At this time monitor for wounds and continue frequent foot exams.  To notify provider if any wounds present or pain. May benefit visit with vascular in future.      Relevant Medications   benazepril  (LOTENSIN ) 20 MG tablet   Hypertension associated with diabetes (HCC)   Chronic, stable.  BP on lower side today and on home checks  Urine ALB 22 March 2023.  Reduce Benazepril  to 20 MG daily and continue remainder of medication regimen. Recommend checking BP a few mornings a week at home and documenting + focus on DASH diet.  Monitor for worsening edema and may provide PRN Lasix  if presents, suspect some underlying PVD based on exam.  Recommend avoid Ibuprofen products and high salt intake.  Return in 3 months.  Labs today: CMP.        Relevant Medications   benazepril  (LOTENSIN ) 20 MG tablet   Other Relevant Orders   Bayer DCA Hb A1c Waived     Endocrine   Type 2 diabetes mellitus with diabetic neuropathy (HCC) - Primary   Chronic, ongoing with A1c 6.7% today, trend up from 6%.  Urine ALB 30 in January 2025.  Continue Metformin  TID.  Recommend he check BS at least three mornings a week and document.  If elevation above goal A1c next visit then may need to restart Jardiance .  Continue collaboration with  neurology, recent notes reviewed.  Return in 3 months. - ACE and statin on board - Eye and foot exams up to date - Vaccinations up to date      Relevant Medications   benazepril  (LOTENSIN ) 20 MG tablet   Other Relevant Orders   Bayer DCA Hb A1c Waived   Hyperlipidemia associated with type 2 diabetes mellitus (HCC)   Chronic, ongoing.  Myalgia presenting with daily Atorvastatin  in past.  Continue Rosuvastatin  10 MG three days a week, educated him on this.  If myalgia with 3 days a week dosing then will trial one day a week, if ongoing myalgia then stop and consider Zetia or injectable.      Relevant Medications   benazepril  (LOTENSIN ) 20 MG tablet   Other Relevant Orders   Bayer DCA Hb A1c Waived   Comprehensive metabolic panel with GFR   Lipid Panel w/o Chol/HDL Ratio   Diabetes mellitus treated with oral medication (HCC)   Refer to diabetes with neuropathy plan of care.      Relevant Medications   benazepril  (LOTENSIN ) 20 MG tablet   Other Relevant Orders   Bayer DCA Hb A1c Waived     Other   B12 deficiency   Ongoing, stable on recent check.  B12 check today, continue supplement daily.        Follow up plan: Return in about 4 weeks (around 11/07/2023) for BP CHECK -- reduced Benazepril  + annual physical after 12/04/23.

## 2023-10-11 ENCOUNTER — Ambulatory Visit: Payer: Self-pay | Admitting: Nurse Practitioner

## 2023-10-11 DIAGNOSIS — H43813 Vitreous degeneration, bilateral: Secondary | ICD-10-CM | POA: Diagnosis not present

## 2023-10-11 DIAGNOSIS — Z961 Presence of intraocular lens: Secondary | ICD-10-CM | POA: Diagnosis not present

## 2023-10-11 DIAGNOSIS — E11319 Type 2 diabetes mellitus with unspecified diabetic retinopathy without macular edema: Secondary | ICD-10-CM | POA: Diagnosis not present

## 2023-10-11 DIAGNOSIS — H35432 Paving stone degeneration of retina, left eye: Secondary | ICD-10-CM | POA: Diagnosis not present

## 2023-10-11 LAB — LIPID PANEL W/O CHOL/HDL RATIO
Cholesterol, Total: 144 mg/dL (ref 100–199)
HDL: 42 mg/dL (ref >39)
LDL Chol Calc (NIH): 76 mg/dL (ref 0–99)
Triglycerides: 148 mg/dL (ref 0–149)
VLDL Cholesterol Cal: 26 mg/dL (ref 5–40)

## 2023-10-11 LAB — COMPREHENSIVE METABOLIC PANEL WITH GFR
ALT: 15 IU/L (ref 0–44)
AST: 19 IU/L (ref 0–40)
Albumin: 4.5 g/dL (ref 3.8–4.8)
Alkaline Phosphatase: 49 IU/L (ref 44–121)
BUN/Creatinine Ratio: 26 — ABNORMAL HIGH (ref 10–24)
BUN: 22 mg/dL (ref 8–27)
Bilirubin Total: 0.6 mg/dL (ref 0.0–1.2)
CO2: 25 mmol/L (ref 20–29)
Calcium: 9.3 mg/dL (ref 8.6–10.2)
Chloride: 101 mmol/L (ref 96–106)
Creatinine, Ser: 0.84 mg/dL (ref 0.76–1.27)
Globulin, Total: 2.2 g/dL (ref 1.5–4.5)
Glucose: 122 mg/dL — ABNORMAL HIGH (ref 70–99)
Potassium: 4.1 mmol/L (ref 3.5–5.2)
Sodium: 140 mmol/L (ref 134–144)
Total Protein: 6.7 g/dL (ref 6.0–8.5)
eGFR: 90 mL/min/1.73 (ref >59)

## 2023-10-11 LAB — HM DIABETES EYE EXAM

## 2023-10-11 NOTE — Progress Notes (Signed)
 Contacted via MyChart  Good morning Tyler Morgan, your labs have returned and overall remain stable. Kidney function, creatinine and eGFR, remains normal, as is liver function, AST and ALT. Cholesterol levels close to goal.  I would continue all medications, no changes.  Any questions? Keep being amazing!!  Thank you for allowing me to participate in your care.  I appreciate you. Kindest regards, Shevy Yaney

## 2023-10-17 ENCOUNTER — Encounter: Payer: Self-pay | Admitting: Urology

## 2023-11-05 ENCOUNTER — Ambulatory Visit: Admitting: Dermatology

## 2023-11-09 ENCOUNTER — Ambulatory Visit: Admitting: Nurse Practitioner

## 2023-11-19 NOTE — Patient Instructions (Signed)
 Be Involved in Caring For Your Health:  Taking Medications When medications are taken as directed, they can greatly improve your health. But if they are not taken as prescribed, they may not work. In some cases, not taking them correctly can be harmful. To help ensure your treatment remains effective and safe, understand your medications and how to take them. Bring your medications to each visit for review by your provider.  Your lab results, notes, and after visit summary will be available on My Chart. We strongly encourage you to use this feature. If lab results are abnormal the clinic will contact you with the appropriate steps. If the clinic does not contact you assume the results are satisfactory. You can always view your results on My Chart. If you have questions regarding your health or results, please contact the clinic during office hours. You can also ask questions on My Chart.  We at [facility] are grateful that you chose us  to provide your care. We strive to provide evidence-based and compassionate care and are always looking for feedback. If you get a survey from the clinic please complete this so we can hear your opinions.  DASH Eating Plan DASH stands for Dietary Approaches to Stop Hypertension. The DASH eating plan is a healthy eating plan that has been shown to: Lower high blood pressure (hypertension). Reduce your risk for type 2 diabetes, heart disease, and stroke. Help with weight loss. What are tips for following this plan? Reading food labels Check food labels for the amount of salt (sodium) per serving. Choose foods with less than 5 percent of the Daily Value (DV) of sodium. In general, foods with less than 300 milligrams (mg) of sodium per serving fit into this eating plan. To find whole grains, look for the word whole as the first word in the ingredient list. Shopping Buy products labeled as low-sodium or no salt added. Buy fresh foods. Avoid canned  foods and pre-made or frozen meals. Cooking Try not to add salt when you cook. Use salt-free seasonings or herbs instead of table salt or sea salt. Check with your health care provider or pharmacist before using salt substitutes. Do not fry foods. Cook foods in healthy ways, such as baking, boiling, grilling, roasting, or broiling. Cook using oils that are good for your heart. These include olive, canola, avocado, soybean, and sunflower oil. Meal planning  Eat a balanced diet. This should include: 4 or more servings of fruits and 4 or more servings of vegetables each day. Try to fill half of your plate with fruits and vegetables. 6-8 servings of whole grains each day. 6 or less servings of lean meat, poultry, or fish each day. 1 oz is 1 serving. A 3 oz (85 g) serving of meat is about the same size as the palm of your hand. One egg is 1 oz (28 g). 2-3 servings of low-fat dairy each day. One serving is 1 cup (237 mL). 1 serving of nuts, seeds, or beans 5 times each week. 2-3 servings of heart-healthy fats. Healthy fats called omega-3 fatty acids are found in foods such as walnuts, flaxseeds, fortified milks, and eggs. These fats are also found in cold-water fish, such as sardines, salmon, and mackerel. Limit how much you eat of: Canned or prepackaged foods. Food that is high in trans fat, such as fried foods. Food that is high in saturated fat, such as fatty meat. Desserts and other sweets, sugary drinks, and other foods with added sugar. Full-fat  dairy products. Do not salt foods before eating. Do not eat more than 4 egg yolks a week. Try to eat at least 2 vegetarian meals a week. Eat more home-cooked food and less restaurant, buffet, and fast food. Lifestyle When eating at a restaurant, ask if your food can be made with less salt or no salt. If you drink alcohol: Limit how much you have to: 0-1 drink a day if you are male. 0-2 drinks a day if you are male. Know how much alcohol is in  your drink. In the [place] one drink is one 12 oz bottle of beer (355 mL), one 5 oz glass of wine (148 mL), or one 1 oz glass of hard liquor (44 mL). General information Avoid eating more than 2,300 mg of salt a day. If you have hypertension, you may need to reduce your sodium intake to 1,500 mg a day. Work with your provider to stay at a healthy body weight or lose weight. Ask what the best weight range is for you. On most days of the week, get at least 30 minutes of exercise that causes your heart to beat faster. This may include walking, swimming, or biking. Work with your provider or dietitian to adjust your eating plan to meet your specific calorie needs. What foods should I eat? Fruits All fresh, dried, or frozen fruit. Canned fruits that are in their natural juice and do not have sugar added to them. Vegetables Fresh or frozen vegetables that are raw, steamed, roasted, or grilled. Low-sodium or reduced-sodium tomato and vegetable juice. Low-sodium or reduced-sodium tomato sauce and tomato paste. Low-sodium or reduced-sodium canned vegetables. Grains Whole-grain or whole-wheat bread. Whole-grain or whole-wheat pasta. Brown rice. Mcneil Madeira. Bulgur. Whole-grain and low-sodium cereals. Pita bread. Low-fat, low-sodium crackers. Whole-wheat flour tortillas. Meats and other proteins Skinless chicken or [place]. Ground chicken or [place]. Pork with fat trimmed off. Fish and seafood. Egg whites. Dried beans, peas, or lentils. Unsalted nuts, nut butters, and seeds. Unsalted canned beans. Lean cuts of beef with fat trimmed off. Low-sodium, lean precooked or cured meat, such as sausages or meat loaves. Dairy Low-fat (1%) or fat-free (skim) milk. Reduced-fat, low-fat, or fat-free cheeses. Nonfat, low-sodium ricotta or cottage cheese. Low-fat or nonfat yogurt. Low-fat, low-sodium cheese. Fats and oils Soft margarine without trans fats. Vegetable oil. Reduced-fat, low-fat, or light mayonnaise and salad  dressings (reduced-sodium). Canola, safflower, olive, avocado, soybean, and sunflower oils. Avocado. Seasonings and condiments Herbs. Spices. Seasoning mixes without salt. Other foods Unsalted popcorn and pretzels. Fat-free sweets. The items listed above may not be all the foods and drinks you can have. Talk to a dietitian to learn more. What foods should I avoid? Fruits Canned fruit in a light or heavy syrup. Fried fruit. Fruit in cream or butter sauce. Vegetables Creamed or fried vegetables. Vegetables in a cheese sauce. Regular canned vegetables that are not marked as low-sodium or reduced-sodium. Regular canned tomato sauce and paste that are not marked as low-sodium or reduced-sodium. Regular tomato and vegetable juices that are not marked as low-sodium or reduced-sodium. Dene. Olives. Grains Baked goods made with fat, such as croissants, muffins, or some breads. Dry pasta or rice meal packs. Meats and other proteins Fatty cuts of meat. Ribs. Fried meat. [place]. Bologna, salami, and other precooked or cured meats, such as sausages or meat loaves, that are not lean and low in sodium. Fat from the back of a pig (fatback). Bratwurst. Salted nuts and seeds. Canned beans with added salt. Canned  or smoked fish. Whole eggs or egg yolks. Chicken or [place] with skin. Dairy Whole or 2% milk, cream, and half-and-half. Whole or full-fat cream cheese. Whole-fat or sweetened yogurt. Full-fat cheese. Nondairy creamers. Whipped toppings. Processed cheese and cheese spreads. Fats and oils Butter. Stick margarine. Lard. Shortening. Ghee. Bacon fat. Tropical oils, such as coconut, palm kernel, or palm oil. Seasonings and condiments Onion salt, garlic salt, seasoned salt, table salt, and sea salt. Worcestershire sauce. Tartar sauce. Barbecue sauce. Teriyaki sauce. Soy sauce, including reduced-sodium soy sauce. Steak sauce. Canned and packaged gravies. Fish sauce. Oyster sauce. Cocktail sauce. Store-bought  horseradish. Ketchup. Mustard. Meat flavorings and tenderizers. Bouillon cubes. Hot sauces. Pre-made or packaged marinades. Pre-made or packaged taco seasonings. Relishes. Regular salad dressings. Other foods Salted popcorn and pretzels. The items listed above may not be all the foods and drinks you should avoid. Talk to a dietitian to learn more. Where to find more information National Heart, Lung, and Blood Institute (NHLBI): BuffaloDryCleaner.gl American Heart Association (AHA): heart.org Academy of Nutrition and Dietetics: eatright.org National Kidney Foundation (NKF): kidney.org This information is not intended to replace advice given to you by your health care provider. Make sure you discuss any questions you have with your health care provider. Document Revised: 02/23/2022 Document Reviewed: 02/23/2022 Elsevier Patient Education  2024 [employer].

## 2023-11-21 ENCOUNTER — Ambulatory Visit: Admitting: Nurse Practitioner

## 2023-11-21 ENCOUNTER — Encounter: Payer: Self-pay | Admitting: Nurse Practitioner

## 2023-11-21 VITALS — BP 106/61 | HR 65 | Temp 98.4°F | Resp 15 | Ht 72.01 in | Wt 178.0 lb

## 2023-11-21 DIAGNOSIS — I739 Peripheral vascular disease, unspecified: Secondary | ICD-10-CM | POA: Diagnosis not present

## 2023-11-21 DIAGNOSIS — I152 Hypertension secondary to endocrine disorders: Secondary | ICD-10-CM | POA: Diagnosis not present

## 2023-11-21 DIAGNOSIS — E1159 Type 2 diabetes mellitus with other circulatory complications: Secondary | ICD-10-CM

## 2023-11-21 DIAGNOSIS — Z23 Encounter for immunization: Secondary | ICD-10-CM

## 2023-11-21 MED ORDER — BENAZEPRIL HCL 10 MG PO TABS: 10.0000 mg | ORAL_TABLET | Freq: Every day | ORAL | 2 refills | Status: DC

## 2023-11-21 NOTE — Assessment & Plan Note (Signed)
 Chronic, stable. BP remains on lower side. Urine ALB 22 March 2023.  Reduce Benazepril  to 10 MG daily and continue remainder of medication regimen. Recommend checking BP a few mornings a week at home and documenting + focus on DASH diet.  Monitor for worsening edema and may provide PRN Lasix  if presents, suspect some underlying PVD based on exam.  Recommend avoid Ibuprofen products and high salt intake.  Return in 3 months.  Labs today: at physical.

## 2023-11-21 NOTE — Progress Notes (Signed)
 BP 106/61 (BP Location: Left Arm, Patient Position: Sitting, Cuff Size: Normal)   Pulse 65   Temp 98.4 F (36.9 C) (Oral)   Resp 15   Ht 6' 0.01 (1.829 m)   Wt 178 lb (80.7 kg)   SpO2 96%   BMI 24.14 kg/m    Subjective:    Patient ID: Tyler Morgan, male    DOB: 02/28/1945, 78 y.o.   MRN: [id number]  HPI: Tyler Morgan is a 78 y.o. male  Chief Complaint  Patient presents with   Hypertension    No concerns since lowering medication.    Leg Swelling    Mentions his SIL talked to him about neuropathy but has no pins and needles, pain in feet. Does state mostly left both can be both feet/legs. Obvious swelling and redness from brace strap to ankles.    HYPERTENSION without Chronic Kidney Disease Follow-up today for reduction in Benazepril  to 20 MG on 10/10/23.  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 Has pain in feet and wears braces presently.  Had exposures to mold at [place] and concerned about this and his neuropathy.  History of positive Elbert Shine and CMV testing in the 90's. Hypertension status: stable  Satisfied with current treatment? yes Duration of hypertension: chronic BP monitoring frequency:  not checking BP range:  BP medication side effects:  no Medication compliance: good compliance Aspirin: yes Recurrent headaches: no Visual changes: no Palpitations: no Dyspnea: no Chest pain: no Lower extremity edema: baseline Dizzy/lightheaded: no   Relevant past medical, surgical, family and social history reviewed and updated as indicated. Interim medical history since our last visit reviewed. Allergies and medications reviewed and updated.  Review of Systems  Constitutional:  Negative for activity change, appetite change, diaphoresis, fatigue, fever and unexpected weight change.  Respiratory:  Negative for cough, chest tightness, shortness of breath and wheezing.   Cardiovascular:  Positive for  leg swelling. Negative for chest pain and palpitations.  Gastrointestinal: Negative.   Neurological: Negative.   Psychiatric/Behavioral: Negative.      Per HPI unless specifically indicated above     Objective:    BP 106/61 (BP Location: Left Arm, Patient Position: Sitting, Cuff Size: Normal)   Pulse 65   Temp 98.4 F (36.9 C) (Oral)   Resp 15   Ht 6' 0.01 (1.829 m)   Wt 178 lb (80.7 kg)   SpO2 96%   BMI 24.14 kg/m   Wt Readings from Last 3 Encounters:  11/21/23 178 lb (80.7 kg)  10/10/23 175 lb 12.8 oz (79.7 kg)  06/21/23 175 lb (79.4 kg)    Physical Exam Vitals and nursing note reviewed.  Constitutional:      General: He is awake. He is not in acute distress.    Appearance: He is well-developed and well-groomed. He is not ill-appearing or toxic-appearing.  HENT:     Head: Normocephalic.     Right Ear: Hearing and external ear normal.     Left Ear: Hearing and external ear normal.  Eyes:     General: Lids are normal.     Extraocular Movements: Extraocular movements intact.     Conjunctiva/sclera: Conjunctivae normal.  Neck:     Thyroid : No thyromegaly.     Vascular: No carotid bruit.  Cardiovascular:     Rate and Rhythm: Normal rate and regular rhythm.     Heart sounds: Normal heart sounds. No murmur heard.    No gallop.  Comments: Dependent rubor bilateral lower extremities and reduced hair pattern. Pulmonary:     Effort: No accessory muscle usage or respiratory distress.     Breath sounds: Normal breath sounds.  Abdominal:     General: Bowel sounds are normal. There is no distension.     Palpations: Abdomen is soft.     Tenderness: There is no abdominal tenderness.  Musculoskeletal:     Cervical back: Full passive range of motion without pain.     Right lower leg: 1+ Edema present.     Left lower leg: 1+ Edema present.  Lymphadenopathy:     Cervical: No cervical adenopathy.  Skin:    General: Skin is warm.     Capillary Refill: Capillary refill  takes less than 2 seconds.  Neurological:     Mental Status: He is alert and oriented to person, place, and time.     Deep Tendon Reflexes: Reflexes are normal and symmetric.     Reflex Scores:      Brachioradialis reflexes are 2+ on the right side and 2+ on the left side.      Patellar reflexes are 2+ on the right side and 2+ on the left side. Psychiatric:        Attention and Perception: Attention normal.        Mood and Affect: Mood normal.        Speech: Speech normal.        Behavior: Behavior normal. Behavior is cooperative.        Thought Content: Thought content normal.     Results for orders placed or performed in visit on 10/12/23  HM DIABETES EYE EXAM   Collection Time: 10/11/23 12:15 PM  Result Value Ref Range   HM Diabetic Eye Exam Retinopathy (A) No Retinopathy      Assessment & Plan:   Problem List Items Addressed This Visit       Cardiovascular and Mediastinum   PVD (peripheral vascular disease)   Suspect some underlying PVD based on exam with diminished lower extremity hair pattern, edema at ankles, and dry skin.  At this time monitor for wounds and continue frequent foot exams.  To notify provider if any wounds present or pain. Referral to vascular for further testing and recommendations.      Relevant Medications   benazepril  (LOTENSIN ) 10 MG tablet   Other Relevant Orders   Ambulatory referral to Vascular Surgery   Hypertension associated with diabetes (HCC) - Primary   Chronic, stable. BP remains on lower side. Urine ALB 22 March 2023.  Reduce Benazepril  to 10 MG daily and continue remainder of medication regimen. Recommend checking BP a few mornings a week at home and documenting + focus on DASH diet.  Monitor for worsening edema and may provide PRN Lasix  if presents, suspect some underlying PVD based on exam.  Recommend avoid Ibuprofen products and high salt intake.  Return in 3 months.  Labs today: at physical.      Relevant Medications   benazepril   (LOTENSIN ) 10 MG tablet   Other Visit Diagnoses       Flu vaccine need       Flu vaccine today, educated patient.   Relevant Orders   Flu vaccine HIGH DOSE PF(Fluzone Trivalent) (Completed)     COVID-19 vaccine administered       Covid vaccine in office today, educated patient.   Relevant Orders   Pfizer Comirnaty Covid-19 Vaccine 50yrs & older (Completed)  Follow up plan: Return in about 4 weeks (around 12/19/2023) for Annual Physical -- T2DM.

## 2023-11-21 NOTE — Assessment & Plan Note (Signed)
 Suspect some underlying PVD based on exam with diminished lower extremity hair pattern, edema at ankles, and dry skin.  At this time monitor for wounds and continue frequent foot exams.  To notify provider if any wounds present or pain. Referral to vascular for further testing and recommendations.

## 2023-11-28 ENCOUNTER — Other Ambulatory Visit: Payer: Self-pay | Admitting: Nurse Practitioner

## 2023-11-30 NOTE — Telephone Encounter (Signed)
 Requested Prescriptions  Pending Prescriptions Disp Refills   rosuvastatin  (CRESTOR ) 10 MG tablet [Pharmacy Med Name: ROSUVASTATIN  CALCIUM  10 MG TAB] 90 tablet 0    Sig: TAKE ONE TABLET BY MOUTH 3 TIMES PER WEEK     Cardiovascular:  Antilipid - Statins 2 Failed - 11/30/2023 12:05 PM      Failed - Lipid Panel in normal range within the last 12 months    Cholesterol, Total  Date Value Ref Range Status  10/10/2023 144 100 - 199 mg/dL Final   Cholesterol Piccolo, Waived  Date Value Ref Range Status  07/30/2018 131 <200 mg/dL Final    Comment:                            Desirable                <200                         Borderline High      200- 239                         High                     >239    LDL Chol Calc (NIH)  Date Value Ref Range Status  10/10/2023 76 0 - 99 mg/dL Final   HDL  Date Value Ref Range Status  10/10/2023 42 >39 mg/dL Final   Triglycerides  Date Value Ref Range Status  10/10/2023 148 0 - 149 mg/dL Final   Triglycerides Piccolo,Waived  Date Value Ref Range Status  07/30/2018 121 <150 mg/dL Final    Comment:                            Normal                   <150                         Borderline High     150 - 199                         High                200 - 499                         Very High                >499          Passed - Cr in normal range and within 360 days    Creatinine  Date Value Ref Range Status  01/05/2014 0.75 0.60 - 1.30 mg/dL Final   Creatinine, Ser  Date Value Ref Range Status  10/10/2023 0.84 0.76 - 1.27 mg/dL Final         Passed - Patient is not pregnant      Passed - Valid encounter within last 12 months    Recent Outpatient Visits           1 week ago Hypertension associated with diabetes (HCC)   [place] [facility] [place], [place] T, NP   1 month ago Type 2 diabetes mellitus with diabetic neuropathy,  without long-term current use of insulin (HCC)   [place] [facility] [place], [place] T, NP   5 months ago Acute non-recurrent maxillary sinusitis   [place] [facility] [place], [place] T, NP   5 months ago Type 2 diabetes mellitus with diabetic neuropathy, without long-term current use of insulin (HCC)   [place] [facility] [place], Melanie DASEN, NP       Future Appointments             In 1 month Garren, Penne SAUNDERS, MD [facility] [place]

## 2023-12-25 ENCOUNTER — Other Ambulatory Visit (INDEPENDENT_AMBULATORY_CARE_PROVIDER_SITE_OTHER): Payer: Self-pay | Admitting: Nurse Practitioner

## 2023-12-25 DIAGNOSIS — I739 Peripheral vascular disease, unspecified: Secondary | ICD-10-CM

## 2023-12-26 ENCOUNTER — Encounter (INDEPENDENT_AMBULATORY_CARE_PROVIDER_SITE_OTHER): Payer: Self-pay | Admitting: Vascular Surgery

## 2023-12-26 ENCOUNTER — Ambulatory Visit (INDEPENDENT_AMBULATORY_CARE_PROVIDER_SITE_OTHER): Admitting: Vascular Surgery

## 2023-12-26 ENCOUNTER — Other Ambulatory Visit (INDEPENDENT_AMBULATORY_CARE_PROVIDER_SITE_OTHER)

## 2023-12-26 VITALS — BP 100/64 | HR 73 | Resp 18 | Ht 72.75 in | Wt 176.2 lb

## 2023-12-26 DIAGNOSIS — I739 Peripheral vascular disease, unspecified: Secondary | ICD-10-CM

## 2023-12-26 DIAGNOSIS — R200 Anesthesia of skin: Secondary | ICD-10-CM

## 2023-12-26 DIAGNOSIS — E1159 Type 2 diabetes mellitus with other circulatory complications: Secondary | ICD-10-CM

## 2023-12-26 DIAGNOSIS — E785 Hyperlipidemia, unspecified: Secondary | ICD-10-CM

## 2023-12-26 DIAGNOSIS — I152 Hypertension secondary to endocrine disorders: Secondary | ICD-10-CM

## 2023-12-26 DIAGNOSIS — E1169 Type 2 diabetes mellitus with other specified complication: Secondary | ICD-10-CM

## 2023-12-26 DIAGNOSIS — R202 Paresthesia of skin: Secondary | ICD-10-CM

## 2023-12-26 DIAGNOSIS — E1140 Type 2 diabetes mellitus with diabetic neuropathy, unspecified: Secondary | ICD-10-CM | POA: Diagnosis not present

## 2023-12-28 LAB — VAS US ABI WITH/WO TBI
Left ABI: 1.37
Right ABI: 1.22

## 2024-01-01 ENCOUNTER — Other Ambulatory Visit: Payer: Self-pay | Admitting: Nurse Practitioner

## 2024-01-01 DIAGNOSIS — I152 Hypertension secondary to endocrine disorders: Secondary | ICD-10-CM

## 2024-01-02 ENCOUNTER — Encounter (INDEPENDENT_AMBULATORY_CARE_PROVIDER_SITE_OTHER): Payer: Self-pay | Admitting: Vascular Surgery

## 2024-01-02 NOTE — Progress Notes (Signed)
 Subjective:    Patient ID: Tyler Morgan, male    DOB: 11-18-1945, 78 y.o.   MRN: [id number] Chief Complaint  Patient presents with   New Patient (Initial Visit)    Ref Valerio consult PVD    Tyler Morgan is  78 yo male who presents to clinic today in consult for increasing peripheral neuropathy worsening numbness and tingling to bilateral lower extremities with a history of peripheral vascular disease.  Patient endorses today that his bilateral lower extremity numbness and tingling has worsened over the last couple of months.  He has seen his primary care physician who is concerned about his vascular status relating to this increase.  Patient endorses that he is in good control of his blood sugar and he has not had any recent infections or sickness that may have contributed to this increase in numbness and tingling.  He denies any open sores or wounds to his lower extremities.  He denies any trauma to his lower extremities today.  Patient did undergo bilateral lower extremity arterial duplex ultrasounds with ABIs on 12/26/2023.  Right ABI today is 1.22 and today's TBI is 1.31.  He has triphasic and normal waveforms throughout Left ABI today is 1.37 and today's TBI is 0.96.  He has triphasic and normal waveforms throughout.  Patient is noted to have normal vasculature at this time.    Review of Systems  Constitutional: Negative.   Musculoskeletal:  Positive for myalgias.  Skin:  Positive for color change.  Neurological:  Positive for numbness.       Long history of diabetes mellitus with peripheral neuropathy numbness and tingling.  All other systems reviewed and are negative.      Objective:   Physical Exam Vitals reviewed.  Constitutional:      Appearance: Normal appearance. He is normal weight.  HENT:     Head: Normocephalic.  Eyes:     Pupils: Pupils are equal, round, and reactive to light.  Cardiovascular:     Rate and Rhythm: Normal rate and regular rhythm.      Pulses: Normal pulses.     Heart sounds: Normal heart sounds.  Pulmonary:     Effort: Pulmonary effort is normal.     Breath sounds: Normal breath sounds.  Abdominal:     General: Abdomen is flat. Bowel sounds are normal.     Palpations: Abdomen is soft.  Musculoskeletal:        General: Normal range of motion.     Comments: History of numbness and tingling to his lower extremities.  Endorses worsening over the last 3 to 4 months.  Skin:    General: Skin is warm and dry.     Capillary Refill: Capillary refill takes 2 to 3 seconds.  Neurological:     General: No focal deficit present.     Mental Status: He is alert and oriented to person, place, and time. Mental status is at baseline.     Sensory: Sensory deficit present.     Comments: Noted sensory deficit to his feet today.  Psychiatric:        Mood and Affect: Mood normal.        Behavior: Behavior normal.        Thought Content: Thought content normal.        Judgment: Judgment normal.     BP 100/64   Pulse 73   Resp 18   Ht 6' 0.75 (1.848 m)   Wt 176 lb 3.2 oz (79.9 kg)  BMI 23.41 kg/m   Past Medical History:  Diagnosis Date   Diabetes mellitus without complication (HCC)    ED (erectile dysfunction)    Hypertension    Motion sickness    boats   Neuropathy    Prostate enlargement    Urinary retention     Social History   Socioeconomic History   Marital status: Married    Spouse name: Not on file   Number of children: Not on file   Years of education: Not on file   Highest education level: Bachelor's degree (e.g., BA, AB, BS)  Occupational History   Not on file  Tobacco Use   Smoking status: Former    Current packs/day: 0.00    Average packs/day: 1 pack/day for 13.0 years (13.0 ttl pk-yrs)    Types: Cigarettes    Start date: 02/21/1971    Quit date: 02/21/1984    Years since quitting: 39.8   Smokeless tobacco: Never  Vaping Use   Vaping status: Never Used  Substance and Sexual Activity   Alcohol  use: Yes    Alcohol/week: 2.0 standard drinks of alcohol    Types: 2 Cans of beer per week    Comment: wine occasionally    Drug use: No   Sexual activity: Not on file  Other Topics Concern   Not on file  Social History Narrative   Working fulltime    Social Drivers of Corporate Investment Banker Strain: Low Risk  (10/10/2023)   Overall Financial Resource Strain (CARDIA)    Difficulty of Paying Living Expenses: Not hard at all  Food Insecurity: No Food Insecurity (10/10/2023)   Hunger Vital Sign    Worried About Running Out of Food in the Last Year: Never true    Ran Out of Food in the Last Year: Never true  Transportation Needs: No Transportation Needs (10/10/2023)   PRAPARE - Administrator, Civil Service (Medical): No    Lack of Transportation (Non-Medical): No  Physical Activity: Insufficiently Active (10/10/2023)   Exercise Vital Sign    Days of Exercise per Week: 3 days    Minutes of Exercise per Session: 40 min  Stress: No Stress Concern Present (10/10/2023)   [employer] of Occupational Health - Occupational Stress Questionnaire    Feeling of Stress: Not at all  Social Connections: Moderately Isolated (10/10/2023)   Social Connection and Isolation Panel    Frequency of Communication with Friends and Family: More than three times a week    Frequency of Social Gatherings with Friends and Family: Twice a week    Attends Religious Services: Never    Database Administrator or Organizations: No    Attends Engineer, Structural: Not on file    Marital Status: Married  Catering Manager Violence: Not At Risk (12/04/2022)   Humiliation, Afraid, Rape, and Kick questionnaire    Fear of Current or Ex-Partner: No    Emotionally Abused: No    Physically Abused: No    Sexually Abused: No    Past Surgical History:  Procedure Laterality Date   BASAL CELL CARCINOMA EXCISION     nose    CATARACT EXTRACTION Bilateral 1986, 2014   COLONOSCOPY WITH PROPOFOL  N/A  03/27/2018   Procedure: COLONOSCOPY WITH BIOPSIES;  Surgeon: Unk Corinn Skiff, MD;  Location: [facility];  Service: Endoscopy;  Laterality: N/A;  Diabetic - oral meds   EYE SURGERY Bilateral [phone]   FRACTURE SURGERY Right    as  a child/ right arm   HERNIA REPAIR Right 01-13-14   inguinal hernia   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 N/A 02/07/2017   Procedure: 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;  Surgeon: Penne Knee, MD;  Location: [facility];  Service: Urology;  Laterality: N/A;   POLYPECTOMY N/A 03/27/2018   Procedure: POLYPECTOMY;  Surgeon: Unk Corinn Skiff, MD;  Location: [facility];  Service: Endoscopy;  Laterality: N/A;   RETINAL DETACHMENT SURGERY  Jan 2015   TONSILLECTOMY  1955    Family History  Problem Relation Age of Onset   Colon cancer Father    Angina Father    Macular degeneration Father    Alzheimer's disease Mother    Prostate cancer Neg Hx    Bladder Cancer Neg Hx    Kidney cancer Neg Hx     Allergies  Allergen Reactions   Acetaminophen Swelling and Other (See Comments)    Swelling of liver    Amlodipine Swelling       Latest Ref Rng & Units 12/04/2022    4:40 PM 11/30/2021    4:04 PM 11/28/2019    3:11 PM  CBC  WBC 3.4 - 10.8 x10E3/uL 5.8  5.5  5.5   Hemoglobin 13.0 - 17.7 g/dL 86.0  85.8  85.9   Hematocrit 37.5 - 51.0 % 42.3  41.6  40.9   Platelets 150 - 450 x10E3/uL 254  222  236       CMP     Component Value Date/Time   NA 140 10/10/2023 1326   NA 140 01/05/2014 0857   K 4.1 10/10/2023 1326   K 4.0 01/05/2014 0857   CL 101 10/10/2023 1326   CL 107 01/05/2014 0857   CO2 25 10/10/2023 1326   CO2 28 01/05/2014 0857   GLUCOSE 122 (H) 10/10/2023 1326   GLUCOSE 106 (H) 01/30/2017 0819   GLUCOSE 173 (H) 01/05/2014 0857   BUN 22 10/10/2023 1326   BUN 18 01/05/2014 0857   CREATININE 0.84 10/10/2023 1326   CREATININE 0.75 01/05/2014 0857   CALCIUM  9.3 10/10/2023 1326    CALCIUM  8.1 (L) 01/05/2014 0857   PROT 6.7 10/10/2023 1326   ALBUMIN 4.5 10/10/2023 1326   AST 19 10/10/2023 1326   AST 14 07/30/2018 1034   ALT 15 10/10/2023 1326   ALT 20 07/30/2018 1034   ALKPHOS 49 10/10/2023 1326   BILITOT 0.6 10/10/2023 1326   EGFR 90 10/10/2023 1326   GFRNONAA 89 03/04/2020 1110   GFRNONAA >60 01/05/2014 0857     VAS US  ABI WITH/WO TBI Result Date: 12/28/2023  LOWER EXTREMITY DOPPLER STUDY Patient Name:  ELISEO WITHERS  Date of Exam:   12/26/2023 Medical Rec #: [id number]           Accession #:    [id number] Date of Birth: 1945-09-28          Patient Gender: M Patient Age:   4 years Exam Location:  [facility] Procedure:      VAS US  ABI WITH/WO TBI Referring Phys: Cordella Shawl --------------------------------------------------------------------------------  Indications: Ischemia  Performing Technologist: Leafy Gibes RVS  Examination Guidelines: A complete evaluation includes at minimum, Doppler waveform signals and systolic blood pressure reading at the level of bilateral brachial, anterior tibial, and posterior tibial arteries, when vessel segments are accessible. Bilateral testing is considered an integral part of a complete examination. Photoelectric Plethysmograph (PPG) waveforms and toe systolic pressure readings are included as required and additional duplex  testing as needed. Limited examinations for reoccurring indications may be performed as noted.  ABI Findings: +---------+------------------+-----+---------+--------+ Right    Rt Pressure (mmHg)IndexWaveform Comment  +---------+------------------+-----+---------+--------+ Brachial 120                                      +---------+------------------+-----+---------+--------+ ATA      143               1.19 triphasic         +---------+------------------+-----+---------+--------+ PTA      146               1.22 triphasic          +---------+------------------+-----+---------+--------+ Great Toe157               1.31 Normal            +---------+------------------+-----+---------+--------+ +---------+------------------+-----+---------+-------+ Left     Lt Pressure (mmHg)IndexWaveform Comment +---------+------------------+-----+---------+-------+ Brachial 117                                     +---------+------------------+-----+---------+-------+ ATA      132               1.10 triphasic        +---------+------------------+-----+---------+-------+ PTA      164               1.37 triphasic        +---------+------------------+-----+---------+-------+ Great Toe115               0.96 Normal           +---------+------------------+-----+---------+-------+ +-------+-----------+-----------+------------+------------+ ABI/TBIToday's ABIToday's TBIPrevious ABIPrevious TBI +-------+-----------+-----------+------------+------------+ Right  1.22       1.31                                +-------+-----------+-----------+------------+------------+ Left   1.37       .96                                 +-------+-----------+-----------+------------+------------+  Summary: Right: Resting right ankle-brachial index is within normal range. The right toe-brachial index is normal.  Left: Resting left ankle-brachial index is within normal range. The left toe-brachial index is normal.  *See table(s) above for measurements and observations.  Electronically signed by Cordella Shawl MD on 12/28/2023 at 8:52:52 AM.    Final        Assessment & Plan:   1. Numbness and tingling of both lower extremities (Primary) Patient underwent bilateral lower extremity arterial duplex ultrasounds with ABIs today.  They were all considerably normal.  ABI is right is 1.22 and ABI on the left is 1.37.  He has triphasic normal waveforms throughout.  He has a normal vasculature going to his bilateral lower extremities.   Therefore I do not believe his vasculature is playing any part in his increase in numbness and tingling to his lower extremities.  His last hemoglobin A1c according to our records was on 10/10/2023 and it was elevated at 6.7.  All glucose measurements taken 420 or above except for 1 at 100.  He has been as high as 164 last October.  Vascular surgery recommends patient follow-up with his PCP for better control  of his blood glucose levels and his hemoglobin A1c.  Patient to follow-up with vein and vascular surgery for any issues as needed  2. Hypertension associated with diabetes (HCC) Continue antihypertensive medications as already ordered, these medications have been reviewed and there are no changes at this time.  3. Type 2 diabetes mellitus with diabetic neuropathy, without long-term current use of insulin (HCC) Continue hypoglycemic medications as already ordered, these medications have been reviewed and there are no changes at this time.  Hgb A1C to be monitored as already arranged by primary service  4. Hyperlipidemia associated with type 2 diabetes mellitus (HCC) Continue statin as ordered and reviewed, no changes at this time   Current Outpatient Medications on File Prior to Visit  Medication Sig Dispense Refill   Alpha-Lipoic Acid 50 MG TABS Take by mouth.     aspirin EC 81 MG tablet Take 81 mg by mouth daily. Swallow whole.     atenolol  (TENORMIN ) 50 MG tablet Take 1 tablet (50 mg total) by mouth daily. 90 tablet 4   benazepril  (LOTENSIN ) 10 MG tablet Take 1 tablet (10 mg total) by mouth daily. 60 tablet 2   Berberine Chloride (BERBERINE HCI PO) Take 600 mg by mouth daily at 2 PM.     hydrochlorothiazide  (HYDRODIURIL ) 25 MG tablet Take 1 tablet (25 mg total) by mouth daily. 90 tablet 3   metFORMIN  (GLUCOPHAGE ) 500 MG tablet TAKE 1 TABLET BY MOUTH THREE TIMES DAILY 270 tablet 1   RESVERATROL PO Take 600 mg by mouth daily.     rosuvastatin  (CRESTOR ) 10 MG tablet TAKE ONE TABLET BY  MOUTH 3 TIMES PER WEEK 90 tablet 0   vitamin B-12 (CYANOCOBALAMIN ) 1000 MCG tablet Take 1,000 mcg by mouth daily.     VITAMIN D PO Take 500 Units by mouth daily.     No current facility-administered medications on file prior to visit.    There are no Patient Instructions on file for this visit. No follow-ups on file.   Gwendlyn JONELLE Shank, NP

## 2024-01-03 NOTE — Telephone Encounter (Signed)
 Requested Prescriptions  Pending Prescriptions Disp Refills   metFORMIN  (GLUCOPHAGE ) 500 MG tablet [Pharmacy Med Name: METFORMIN  HCL 500 MG TAB] 270 tablet 1    Sig: TAKE 1 TABLET BY MOUTH THREE TIMES DAILY     Endocrinology:  Diabetes - Biguanides Failed - 01/03/2024  3:00 PM      Failed - B12 Level in normal range and within 720 days    Vitamin B-12  Date Value Ref Range Status  11/30/2021 852 232 - 1,245 pg/mL Final         Failed - CBC within normal limits and completed in the last 12 months    WBC  Date Value Ref Range Status  12/04/2022 5.8 3.4 - 10.8 x10E3/uL Final  01/30/2017 5.3 3.8 - 10.6 K/uL Final   RBC  Date Value Ref Range Status  12/04/2022 4.75 4.14 - 5.80 x10E6/uL Final  01/30/2017 5.16 4.40 - 5.90 MIL/uL Final   Hemoglobin  Date Value Ref Range Status  12/04/2022 13.9 13.0 - 17.7 g/dL Final   Hematocrit  Date Value Ref Range Status  12/04/2022 42.3 37.5 - 51.0 % Final   MCHC  Date Value Ref Range Status  12/04/2022 32.9 31.5 - 35.7 g/dL Final  87/88/7981 66.2 32.0 - 36.0 g/dL Final   [facility]  Date Value Ref Range Status  12/04/2022 29.3 26.6 - 33.0 pg Final  01/30/2017 29.0 26.0 - 34.0 pg Final   MCV  Date Value Ref Range Status  12/04/2022 89 79 - 97 fL Final  01/05/2014 86 80 - 100 fL Final   No results found for: PLTCOUNTKUC, LABPLAT, POCPLA RDW  Date Value Ref Range Status  12/04/2022 12.9 11.6 - 15.4 % Final  01/05/2014 13.4 11.5 - 14.5 % Final         Passed - Cr in normal range and within 360 days    Creatinine  Date Value Ref Range Status  01/05/2014 0.75 0.60 - 1.30 mg/dL Final   Creatinine, Ser  Date Value Ref Range Status  10/10/2023 0.84 0.76 - 1.27 mg/dL Final         Passed - HBA1C is between 0 and 7.9 and within 180 days    Hemoglobin A1C  Date Value Ref Range Status  07/21/2015 8.2  Final   HB A1C (BAYER DCA - WAIVED)  Date Value Ref Range Status  10/10/2023 6.7 (H) 4.8 - 5.6 % Final    Comment:              Prediabetes: 5.7 - 6.4          Diabetes: >6.4          Glycemic control for adults with diabetes: <7.0          Passed - eGFR in normal range and within 360 days    EGFR (African American)  Date Value Ref Range Status  01/05/2014 >60 >19mL/min Final   GFR calc Af Amer  Date Value Ref Range Status  03/04/2020 103 >59 mL/min/1.73 Final    Comment:    **In accordance with recommendations from the NKF-ASN Task force,**   [facility] is in the process of updating its eGFR calculation to the   2021 CKD-EPI creatinine equation that estimates kidney function   without a race variable.    EGFR (Non-African Amer.)  Date Value Ref Range Status  01/05/2014 >60 >8mL/min Final    Comment:    eGFR values <57mL/min/1.73 m2 may be an indication of chronic kidney disease (CKD). Calculated eGFR,  using the MRDR Study equation, is useful in  patients with stable renal function. The eGFR calculation will not be reliable in acutely ill patients when serum creatinine is changing rapidly. It is not useful in patients on dialysis. The eGFR calculation may not be applicable to patients at the low and high extremes of body sizes, pregnant women, and vegetarians.    GFR calc non Af Amer  Date Value Ref Range Status  03/04/2020 89 >59 mL/min/1.73 Final   eGFR  Date Value Ref Range Status  10/10/2023 90 >59 mL/min/1.73 Final         Passed - Valid encounter within last 6 months    Recent Outpatient Visits           1 month ago Hypertension associated with diabetes (HCC)   [place] [facility] [place], [place] T, NP   2 months ago Type 2 diabetes mellitus with diabetic neuropathy, without long-term current use of insulin (HCC)   [place] [facility] [place], [place] T, NP   6 months ago Acute non-recurrent maxillary sinusitis   [place] [facility] [place], [place] T, NP   7 months ago Type 2 diabetes mellitus with diabetic neuropathy, without  long-term current use of insulin (HCC)   [place] [facility] [place], Melanie T, NP       Future Appointments             In 1 week Georganne Penne SAUNDERS, MD [facility] [place]             atenolol  (TENORMIN ) 50 MG tablet [Pharmacy Med Name: ATENOLOL  50 MG TAB] 90 tablet 4    Sig: TAKE ONE TABLET BY MOUTH EVERY DAY     Cardiovascular: Beta Blockers 2 Passed - 01/03/2024  3:00 PM      Passed - Cr in normal range and within 360 days    Creatinine  Date Value Ref Range Status  01/05/2014 0.75 0.60 - 1.30 mg/dL Final   Creatinine, Ser  Date Value Ref Range Status  10/10/2023 0.84 0.76 - 1.27 mg/dL Final         Passed - Last BP in normal range    BP Readings from Last 1 Encounters:  12/26/23 100/64         Passed - Last Heart Rate in normal range    Pulse Readings from Last 1 Encounters:  12/26/23 73         Passed - Valid encounter within last 6 months    Recent Outpatient Visits           1 month ago Hypertension associated with diabetes (HCC)   [place] [facility] [place], Melanie T, NP   2 months ago Type 2 diabetes mellitus with diabetic neuropathy, without long-term current use of insulin (HCC)   [place] [facility] [place], Winifred T, NP   6 months ago Acute non-recurrent maxillary sinusitis   [place], [place] T, NP   7 months ago Type 2 diabetes mellitus with diabetic neuropathy, without long-term current use of insulin (HCC)   [place] [facility] [place], Melanie DASEN, NP       Future Appointments             In 1 week Georganne, Penne SAUNDERS, MD [facility] [place]

## 2024-01-04 DIAGNOSIS — C61 Malignant neoplasm of prostate: Secondary | ICD-10-CM | POA: Insufficient documentation

## 2024-01-04 DIAGNOSIS — N400 Enlarged prostate without lower urinary tract symptoms: Secondary | ICD-10-CM | POA: Insufficient documentation

## 2024-01-04 NOTE — Progress Notes (Signed)
   01/10/2024 9:56 AM   Tyler Morgan 1946-02-13 [id number]  Reason for visit: Follow up BPH, prostate Ca   HPI: 78 y.o. male, initial follow up with me today, previously seen by Dr. Penne in Nov 2024 First time meeting Very pleasant patient No interval changes in GU history, no new symptoms Remains very happy post HoLEP-urinating well  Prior HPI: Hx of prostate Ca  - T1a low-risk disease (at time of HoLEP in 2018)   - MRI 2019 - PIRADS 5 at left lateral mid/apex, 74g  - cognitive fusion bx (Oct 2020) - benign   Hx of BPH - s/p HoLEP in 2018  - 74g gland    Physical Exam: BP 114/71   Pulse 60   Ht 6' 1 (1.854 m)   Wt 177 lb 9.6 oz (80.6 kg)   SpO2 95%   BMI 23.43 kg/m    Constitutional:  Alert and oriented, No acute distress.  Laboratory Data: Component Ref Range & Units (hover) 5 mo ago (07/17/23) 11 mo ago (01/10/23) 1 yr ago (07/11/22) 2 yr ago (12/29/21) 2 yr ago (06/27/21) 3 yr ago (12/22/20) 3 yr ago (11/29/20)  Prostate Specific Ag, Serum 2.8 2.3 CM 1.9 CM 2.1 CM 2.8 CM 2.4 CM 10.5 High      Pertinent Imaging: N/A    Assessment & Plan:    Benign prostatic hyperplasia, unspecified whether lower urinary tract symptoms present Assessment & Plan: s/p HoLEP in 2018  - 74g gland  -No medical therapy  Overall doing very well, remains happy since HoLEP No changes in urinary habits, minimally bothersome symptoms  - Continue expectant management, return to clinic in 1 year for symptom check   Prostate cancer [facility]) Assessment & Plan: T1a low-risk disease (at time of HoLEP in 2018)   - MRI 2019 - PIRADS 5 at left lateral mid/apex, 74g  - cognitive fusion bx (2020) - benign   Last PSA 2.8 (May 2025) - stable  - May continue to get annual PSA for stable low-risk disease surveillance. He does not necessarily need interval MRI/biopsy        Penne JONELLE Skye, MD  [facility] [address], [address] [place], [place]  [address] [phone]

## 2024-01-04 NOTE — Assessment & Plan Note (Addendum)
 T1a low-risk disease (at time of HoLEP in 2018)   - MRI 2019 - PIRADS 5 at left lateral mid/apex, 74g  - cognitive fusion bx (2020) - benign   Last PSA 2.8 (May 2025) - stable  - May continue to get annual PSA for stable low-risk disease surveillance. He does not necessarily need interval MRI/biopsy

## 2024-01-04 NOTE — Assessment & Plan Note (Addendum)
 s/p HoLEP in 2018  - 74g gland  -No medical therapy  Overall doing very well, remains happy since HoLEP No changes in urinary habits, minimally bothersome symptoms  - Continue expectant management, return to clinic in 1 year for symptom check

## 2024-01-06 NOTE — Patient Instructions (Signed)

## 2024-01-07 ENCOUNTER — Other Ambulatory Visit: Payer: Self-pay

## 2024-01-07 DIAGNOSIS — C61 Malignant neoplasm of prostate: Secondary | ICD-10-CM

## 2024-01-08 ENCOUNTER — Other Ambulatory Visit: Payer: Self-pay

## 2024-01-08 ENCOUNTER — Other Ambulatory Visit

## 2024-01-08 DIAGNOSIS — C61 Malignant neoplasm of prostate: Secondary | ICD-10-CM

## 2024-01-09 LAB — PSA: Prostate Specific Ag, Serum: 2.4 ng/mL (ref 0.0–4.0)

## 2024-01-10 ENCOUNTER — Ambulatory Visit: Admitting: Urology

## 2024-01-10 ENCOUNTER — Encounter: Payer: Self-pay | Admitting: Urology

## 2024-01-10 ENCOUNTER — Other Ambulatory Visit: Payer: Self-pay | Admitting: Neurology

## 2024-01-10 VITALS — BP 114/71 | HR 60 | Ht 73.0 in | Wt 177.6 lb

## 2024-01-10 DIAGNOSIS — N400 Enlarged prostate without lower urinary tract symptoms: Secondary | ICD-10-CM

## 2024-01-10 DIAGNOSIS — R200 Anesthesia of skin: Secondary | ICD-10-CM

## 2024-01-10 DIAGNOSIS — M51369 Other intervertebral disc degeneration, lumbar region without mention of lumbar back pain or lower extremity pain: Secondary | ICD-10-CM

## 2024-01-10 DIAGNOSIS — G629 Polyneuropathy, unspecified: Secondary | ICD-10-CM

## 2024-01-10 DIAGNOSIS — R2689 Other abnormalities of gait and mobility: Secondary | ICD-10-CM

## 2024-01-10 DIAGNOSIS — C61 Malignant neoplasm of prostate: Secondary | ICD-10-CM | POA: Diagnosis not present

## 2024-01-10 NOTE — Addendum Note (Signed)
 Addended byBETHA CORIE PLATER on: 01/10/2024 09:57 AM   Modules accepted: Orders

## 2024-01-11 ENCOUNTER — Ambulatory Visit: Admitting: Nurse Practitioner

## 2024-01-11 ENCOUNTER — Encounter: Payer: Self-pay | Admitting: Nurse Practitioner

## 2024-01-11 VITALS — BP 114/69 | HR 65 | Temp 98.4°F | Resp 15 | Ht 72.99 in | Wt 177.4 lb

## 2024-01-11 DIAGNOSIS — E538 Deficiency of other specified B group vitamins: Secondary | ICD-10-CM

## 2024-01-11 DIAGNOSIS — E119 Type 2 diabetes mellitus without complications: Secondary | ICD-10-CM

## 2024-01-11 DIAGNOSIS — E1140 Type 2 diabetes mellitus with diabetic neuropathy, unspecified: Secondary | ICD-10-CM

## 2024-01-11 DIAGNOSIS — Z8546 Personal history of malignant neoplasm of prostate: Secondary | ICD-10-CM

## 2024-01-11 DIAGNOSIS — G629 Polyneuropathy, unspecified: Secondary | ICD-10-CM

## 2024-01-11 DIAGNOSIS — I152 Hypertension secondary to endocrine disorders: Secondary | ICD-10-CM

## 2024-01-11 DIAGNOSIS — E1159 Type 2 diabetes mellitus with other circulatory complications: Secondary | ICD-10-CM

## 2024-01-11 DIAGNOSIS — E785 Hyperlipidemia, unspecified: Secondary | ICD-10-CM

## 2024-01-11 DIAGNOSIS — E1169 Type 2 diabetes mellitus with other specified complication: Secondary | ICD-10-CM | POA: Diagnosis not present

## 2024-01-11 DIAGNOSIS — I739 Peripheral vascular disease, unspecified: Secondary | ICD-10-CM

## 2024-01-11 DIAGNOSIS — Z0000 Encounter for general adult medical examination without abnormal findings: Secondary | ICD-10-CM | POA: Diagnosis not present

## 2024-01-11 DIAGNOSIS — Z7984 Long term (current) use of oral hypoglycemic drugs: Secondary | ICD-10-CM

## 2024-01-11 LAB — BAYER DCA HB A1C WAIVED: HB A1C (BAYER DCA - WAIVED): 6.4 % — ABNORMAL HIGH (ref 4.8–5.6)

## 2024-01-11 MED ORDER — ROSUVASTATIN CALCIUM 10 MG PO TABS: ORAL_TABLET | ORAL | 3 refills | Status: AC

## 2024-01-11 MED ORDER — METFORMIN HCL 500 MG PO TABS: 500.0000 mg | ORAL_TABLET | Freq: Three times a day (TID) | ORAL | 3 refills | Status: AC

## 2024-01-11 MED ORDER — HYDROCHLOROTHIAZIDE 25 MG PO TABS: 25.0000 mg | ORAL_TABLET | Freq: Every day | ORAL | 3 refills | Status: AC

## 2024-01-11 MED ORDER — ATENOLOL 50 MG PO TABS: 50.0000 mg | ORAL_TABLET | Freq: Every day | ORAL | 3 refills | Status: AC

## 2024-01-11 MED ORDER — BENAZEPRIL HCL 10 MG PO TABS: 10.0000 mg | ORAL_TABLET | Freq: Every day | ORAL | 3 refills | Status: AC

## 2024-01-11 NOTE — Assessment & Plan Note (Signed)
 Ongoing, stable on recent check.  B12 level up to date and at goal, continue supplement daily.

## 2024-01-11 NOTE — Assessment & Plan Note (Signed)
 Chronic, stable. BP remains on lower side. Urine ALB 22 March 2023.  Continue current medication regimen and adjust as needed. Recommend checking BP a few mornings a week at home and documenting + focus on DASH diet.  Monitor for worsening edema and may provide PRN Lasix  if presents, suspect some underlying PVD based on exam.  Recommend avoid Ibuprofen products and high salt intake.  Return in 3 months.  Labs today: CBC, CMP, TSH.

## 2024-01-11 NOTE — Assessment & Plan Note (Signed)
 Suspect some underlying PVD based on exam with diminished lower extremity hair pattern, edema at ankles, and dry skin.  At this time monitor for wounds and continue frequent foot exams.  To notify provider if any wounds present or pain. Vascular assessment was overall reassuring.

## 2024-01-11 NOTE — Assessment & Plan Note (Signed)
 Chronic, ongoing.  Myalgia presenting with daily Atorvastatin  in past.  Continue Rosuvastatin  10 MG but can try weekly dosing due to his concern about muscle loss, educated him on this. Consider Zetia or injectable if any ongoing issues or labs not at goal.

## 2024-01-11 NOTE — Progress Notes (Signed)
 Chief Complaint  Patient presents with   Annual Exam   Medicare annual wellness     Subjective:   Tyler Morgan is a 78 y.o. male who presents for a Medicare Annual Wellness Visit.  Allergies (verified) Acetaminophen and Amlodipine   History: Past Medical History:  Diagnosis Date   Diabetes mellitus without complication [facility])    ED (erectile dysfunction)    Hypertension    Motion sickness    boats   Neuropathy    Prostate enlargement    Urinary retention    Past Surgical History:  Procedure Laterality Date   BASAL CELL CARCINOMA EXCISION     nose    CATARACT EXTRACTION Bilateral 1986, 2014   COLONOSCOPY WITH PROPOFOL  N/A 03/27/2018   Procedure: COLONOSCOPY WITH BIOPSIES;  Surgeon: Unk Corinn Skiff, MD;  Location: [facility];  Service: Endoscopy;  Laterality: N/A;  Diabetic - oral meds   EYE SURGERY Bilateral [phone]   FRACTURE SURGERY Right    as a child/ right arm   HERNIA REPAIR Right 01-13-14   inguinal hernia   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 N/A 02/07/2017   Procedure: 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;  Surgeon: Penne Knee, MD;  Location: [facility];  Service: Urology;  Laterality: N/A;   POLYPECTOMY N/A 03/27/2018   Procedure: POLYPECTOMY;  Surgeon: Unk Corinn Skiff, MD;  Location: [facility];  Service: Endoscopy;  Laterality: N/A;   RETINAL DETACHMENT SURGERY  Jan 2015   TONSILLECTOMY  1955   Family History  Problem Relation Age of Onset   Colon cancer Father    Angina Father    Macular degeneration Father    Alzheimer's disease Mother    Prostate cancer Neg Hx    Bladder Cancer Neg Hx    Kidney cancer Neg Hx    Social History   Occupational History   Not on file  Tobacco Use   Smoking status: Former    Current packs/day: 0.00    Average packs/day: 1 pack/day for 40.0 years (40.0 ttl pk-yrs)    Types: Cigarettes    Start date: 02/21/1971    Quit date: 02/21/1984     Years since quitting: 39.9   Smokeless tobacco: Never  Vaping Use   Vaping status: Never Used  Substance and Sexual Activity   Alcohol use: Yes    Alcohol/week: 1.0 standard drink of alcohol    Types: 1 Cans of beer per week    Comment: wine occasionally    Drug use: Not Currently    Types: Marijuana   Sexual activity: Not Currently   Tobacco Counseling Counseling given: Not Answered  SDOH Screenings   Food Insecurity: No Food Insecurity (10/10/2023)  Housing: Low Risk  (10/10/2023)  Transportation Needs: No Transportation Needs (10/10/2023)  Utilities: Not At Risk (01/11/2024)  Alcohol Screen: Low Risk  (10/10/2023)  Depression (PHQ2-9): Low Risk  (11/21/2023)  Financial Resource Strain: Low Risk  (10/10/2023)  Physical Activity: Insufficiently Active (10/10/2023)  Social Connections: Moderately Isolated (10/10/2023)  Stress: No Stress Concern Present (10/10/2023)  Tobacco Use: Medium Risk (01/11/2024)  Health Literacy: Adequate Health Literacy (01/11/2024)   See flowsheets for full screening details  Depression Screen PHQ 2 & 9 Depression Scale- Over the past 2 weeks, how often have you been bothered by any of the following problems? Little interest or pleasure in doing things: 0 Feeling down, depressed, or hopeless (PHQ Adolescent also includes...irritable): 0 PHQ-2 Total Score: 0 Trouble falling or staying asleep,  or sleeping too much: 0 Feeling tired or having little energy: 0 Poor appetite or overeating (PHQ Adolescent also includes...weight loss): 0 Feeling bad about yourself - or that you are a failure or have let yourself or your family down: 0 Trouble concentrating on things, such as reading the newspaper or watching television (PHQ Adolescent also includes...like school work): 0 Moving or speaking so slowly that other people could have noticed. Or the opposite - being so fidgety or restless that you have been moving around a lot more than usual: 0 Thoughts that you would  be better off dead, or of hurting yourself in some way: 0 PHQ-9 Total Score: 0 If you checked off any problems, how difficult have these problems made it for you to do your work, take care of things at home, or get along with other people?: Not difficult at all     Goals Addressed               This Visit's Progress     DIET - INCREASE WATER  INTAKE   On track     Recommend drinking at least 6-8 glasses of water  a day       Increase My Muscle Strength/Mass (pt-stated)        Struggles due to statin use and previous Jardiance  use.       Increase water  intake   On track     Recommend drinking at least 5-6 glasses of water  a day       Patient Stated   Not on track     11/10/2019, wants to walk more      COMPLETED: Patient Stated        11/12/2020, no goals       Visit info / Clinical Intake: Medicare Wellness Visit Type:: Subsequent Annual Wellness Visit Persons participating in visit:: patient Medicare Wellness Visit Mode:: In-person (required for WTM) Information given by:: patient Interpreter Needed?: No Pre-visit prep was completed: yes AWV questionnaire completed by patient prior to visit?: yes Living arrangements:: lives with spouse/significant other Patient's Overall Health Status Rating: good Typical amount of pain: (!) a lot (Latley in alot of pain with his hands.) Does pain affect daily life?: (!) yes Are you currently prescribed opioids?: no  Dietary Habits and Nutritional Risks How many meals a day?: 3 Eats fruit and vegetables daily?: yes Most meals are obtained by: preparing own meals In the last 2 weeks, have you had any of the following?: none Diabetic:: (!) yes Any non-healing wounds?: no How often do you check your BS?: as needed Would you like to be referred to a Nutritionist or for Diabetic Management? : no  Functional Status Activities of Daily Living (to include ambulation/medication): Independent Ambulation: Independent with device- listed  below Home Assistive Devices/Equipment: Cane Medication Administration: Independent Home Management: Independent Manage your own finances?: yes Primary transportation is: driving Concerns about vision?: (!) yes (Wears glasses. Dark spot in vision is starting to clear.) Concerns about hearing?: (!) yes (High frequency.) Uses hearing aids?: (!) yes Hear whispered voice?: yes  Fall Screening Falls in the past year?: 1 Number of falls in past year: 0 Was there an injury with Fall?: 1 Fall Risk Category Calculator: 2 Patient Fall Risk Level: Moderate Fall Risk  Fall Risk Patient at Risk for Falls Due to: History of fall(s); Impaired balance/gait; Impaired mobility Fall risk Follow up: Falls evaluation completed  Home and Transportation Safety: All rugs have non-skid backing?: yes All stairs or steps have railings?:  yes Grab bars in the bathtub or shower?: yes Have non-skid surface in bathtub or shower?: yes Good home lighting?: yes Regular seat belt use?: yes  Cognitive Assessment Difficulty concentrating, remembering, or making decisions? : no Will 6CIT or Mini Cog be Completed: yes What year is it?: 0 points What month is it?: 0 points Give patient an address phrase to remember (5 components): [address] [place] [phone] About what time is it?: 0 points Count backwards from 20 to 1: 0 points Say the months of the year in reverse: 0 points Repeat the address phrase from earlier: 2 points 6 CIT Score: 2 points  Advance Directives (For Healthcare) Does Patient Have a Medical Advance Directive?: No Would patient like information on creating a medical advance directive?: Yes (MAU/Ambulatory/Procedural Areas - Information given)  Reviewed/Updated  Reviewed/Updated: Reviewed All (Medical, Surgical, Family, Medications, Allergies, Care Teams, Patient Goals); Medical History; Surgical History; Family History; Medications; Allergies; Care Teams; Patient Goals         Objective:    Today's Vitals   01/11/24 1508  BP: 114/69  Pulse: 65  Resp: 15  Temp: 98.4 F (36.9 C)  TempSrc: Oral  SpO2: 98%  Weight: 177 lb 6.4 oz (80.5 kg)  Height: 6' 0.99 (1.854 m)  PainSc: 2   PainLoc: Hand   Body mass index is 23.41 kg/m.  Current Medications (verified) Outpatient Encounter Medications as of 01/11/2024  Medication Sig   Alpha-Lipoic Acid 50 MG TABS Take by mouth.   aspirin EC 81 MG tablet Take 81 mg by mouth daily. Swallow whole.   atenolol  (TENORMIN ) 50 MG tablet TAKE ONE TABLET BY MOUTH EVERY DAY   benazepril  (LOTENSIN ) 10 MG tablet Take 1 tablet (10 mg total) by mouth daily.   Berberine Chloride (BERBERINE HCI PO) Take 600 mg by mouth daily at 2 PM.   hydrochlorothiazide  (HYDRODIURIL ) 25 MG tablet Take 1 tablet (25 mg total) by mouth daily.   Magnesium Salicylate 325 MG TABS Take 1 each by mouth daily.   metFORMIN  (GLUCOPHAGE ) 500 MG tablet TAKE 1 TABLET BY MOUTH THREE TIMES DAILY   RESVERATROL PO Take 600 mg by mouth daily.   rosuvastatin  (CRESTOR ) 10 MG tablet TAKE ONE TABLET BY MOUTH 3 TIMES PER WEEK   vitamin B-12 (CYANOCOBALAMIN ) 1000 MCG tablet Take 1,000 mcg by mouth daily.   VITAMIN D PO Take 500 Units by mouth daily.   No facility-administered encounter medications on file as of 01/11/2024.   Hearing/Vision screen No results found. Immunizations and Health Maintenance Health Maintenance  Topic Date Due   FOOT EXAM  12/04/2023   Medicare Annual Wellness (AWV)  12/04/2023   Colonoscopy  06/04/2024 (Originally 03/28/2023)   Diabetic kidney evaluation - Urine ACR  03/05/2024   HEMOGLOBIN A1C  04/11/2024   COVID-19 Vaccine (9 - Pfizer risk 2025-26 season) 05/21/2024   Diabetic kidney evaluation - eGFR measurement  10/09/2024   OPHTHALMOLOGY EXAM  10/10/2024   DTaP/Tdap/Td (3 - Td or Tdap) 09/02/2030   Pneumococcal Vaccine: 50+ Years  Completed   Influenza Vaccine  Completed   Hepatitis C Screening  Completed   Zoster Vaccines-  Shingrix   Completed   Meningococcal B Vaccine  Aged Out        Assessment/Plan:  This is a routine wellness examination for Danis.  Patient Care Team: Cannady, Jolene T, NP as PCP - General (Nurse Practitioner) [place], [facility] (Optometry) Maree Jannett POUR, MD as Consulting Physician (Neurology)  I have personally reviewed and  noted the following in the patient's chart:   Medical and social history Use of alcohol, tobacco or illicit drugs  Current medications and supplements including opioid prescriptions. Functional ability and status Nutritional status Physical activity Advanced directives List of other physicians Hospitalizations, surgeries, and ER visits in previous 12 months Vitals Screenings to include cognitive, depression, and falls Referrals and appointments  Orders Placed This Encounter  Procedures   Bayer DCA Hb A1c Waived   CBC with Differential/Platelet   Comprehensive metabolic panel with GFR   TSH   Lipid Panel w/o Chol/HDL Ratio   In addition, I have reviewed and discussed with patient certain preventive protocols, quality metrics, and best practice recommendations. A written personalized care plan for preventive services as well as general preventive health recommendations were provided to patient.   Katie L., CMA   01/11/2024   Return in about 3 months (around 04/12/2024) for T2DM, HTN/HLD.  After Visit Summary: (In Person-Printed) AVS printed and given to the patient

## 2024-01-11 NOTE — Progress Notes (Signed)
 BP 114/69 (BP Location: Left Arm, Patient Position: Sitting, Cuff Size: Normal)   Pulse 65   Temp 98.4 F (36.9 C) (Oral)   Resp 15   Ht 6' 0.99 (1.854 m)   Wt 177 lb 6.4 oz (80.5 kg)   SpO2 98%   BMI 23.41 kg/m    Subjective:    Patient ID: Tyler Morgan, male    DOB: 12/19/1945, 78 y.o.   MRN: [id number]  HPI: Tyler Morgan is a 78 y.o. male presenting on 01/11/2024 for Medicare Wellness annual visit. Current medical complaints include:none  He currently lives with: wife Interim Problems from his last visit: no   DIABETES A1c 6.7% in August.  Continues Metformin  500 MG TID.  Jardiance  in past, on for less then a year, tolerated this.  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 Takes Alpha Lipoic Acid and B12 daily. Saw neurology last 01/09/24, MRI of lumber spine ordered and B12 level checked. Did have visit with vascular to ensure no circulation element involved, and testing was normal 12/31/23. Hypoglycemic episodes:no Polydipsia/polyuria: no Visual disturbance: no Chest pain: no Paresthesias: no Glucose Monitoring: no  Accucheck frequency: Not Checking  Fasting glucose:  Post prandial:  Evening:  Before meals: Taking Insulin?: no  Long acting insulin:  Short acting insulin: Blood Pressure Monitoring: rarely Retinal Examination: Up To Date - [facility]  Foot Exam: Up to Date Pneumovax: Up to Date Influenza: Up to Date Aspirin: no   HYPERTENSION / HYPERLIPIDEMIA Takes Benazepril , Atenolol , HCTZ + Crestor .   Satisfied with current treatment? yes Duration of hypertension: chronic BP monitoring frequency: not checking BP range:  BP medication side effects: no Duration of hyperlipidemia: chronic Cholesterol medication side effects: no Cholesterol supplements: none Medication compliance: good compliance Aspirin: no Recent stressors: no Recurrent headaches: no Visual changes: no Palpitations: no Dyspnea:  no Chest pain: no Lower extremity edema: no Dizzy/lightheaded: no   PROSTATE CANCER: Follows with urology, last saw 01/10/24. Was told he was cancer free in 2021.  Continues to have monitoring of PSA, every 6 months, recent May was 1.9. Denies any symptoms at this time.      Functional Status Survey: Is the patient deaf or have difficulty hearing?: No Does the patient have difficulty seeing, even when wearing glasses/contacts?: No Does the patient have difficulty concentrating, remembering, or making decisions?: No Does the patient have difficulty walking or climbing stairs?: Yes Does the patient have difficulty dressing or bathing?: No Does the patient have difficulty doing errands alone such as visiting a doctor's office or shopping?: No  FALL RISK:    01/11/2024    2:52 PM 11/21/2023    4:01 PM 10/10/2023    1:22 PM 03/06/2023    4:13 PM 09/01/2022    9:35 AM  Fall Risk   Falls in the past year? 1 1 0 0 1  Number falls in past yr: 0 1 0 0 0  Injury with Fall? 1 0 0 0 0  Risk for fall due to : History of fall(s);Impaired balance/gait;Impaired mobility History of fall(s);Impaired balance/gait;Impaired mobility No Fall Risks No Fall Risks Impaired balance/gait;History of fall(s);Impaired mobility  Follow up Falls evaluation completed Falls evaluation completed Falls evaluation completed Falls evaluation completed Falls evaluation completed   Depression Screen    11/21/2023    4:01 PM 10/10/2023    1:22 PM 03/06/2023    4:13 PM 12/04/2022    4:04 PM 09/01/2022    9:36 AM  Depression screen  PHQ 2/9  Decreased Interest 0 0 0 0 0  Down, Depressed, Hopeless 0 0 0 0 0  PHQ - 2 Score 0 0 0 0 0  Altered sleeping 0 0 0 0 0  Tired, decreased energy 0 0 0 0 0  Change in appetite 0 0 0 0 0  Feeling bad or failure about yourself  0 0 0 0 0  Trouble concentrating 0 0 0 0 0  Moving slowly or fidgety/restless 0 0 0 0 0  Suicidal thoughts 0 0 0 0 0  PHQ-9 Score 0  0  0  0  0   Difficult  doing work/chores  Not difficult at all Not difficult at all       Data saved with a previous flowsheet row definition      11/21/2023    4:01 PM 10/10/2023    1:22 PM 03/06/2023    4:13 PM 12/04/2022    4:04 PM  GAD 7 : Generalized Anxiety Score  Nervous, Anxious, on Edge 0 0 0 0  Control/stop worrying 0 0 0 0  Worry too much - different things 0 0 0 0  Trouble relaxing 0 0 0 0  Restless 0 0 0 0  Easily annoyed or irritable 0 0 0 0  Afraid - awful might happen 0 0 0 0  Total GAD 7 Score 0 0 0 0  Anxiety Difficulty  Not difficult at all Not difficult at all    Past Medical History:  Past Medical History:  Diagnosis Date   Diabetes mellitus without complication (HCC)    ED (erectile dysfunction)    Hypertension    Motion sickness    boats   Neuropathy    Prostate enlargement    Urinary retention     Surgical History:  Past Surgical History:  Procedure Laterality Date   BASAL CELL CARCINOMA EXCISION     nose    CATARACT EXTRACTION Bilateral 1986, 2014   COLONOSCOPY WITH PROPOFOL  N/A 03/27/2018   Procedure: COLONOSCOPY WITH BIOPSIES;  Surgeon: Unk Corinn Skiff, MD;  Location: [facility];  Service: Endoscopy;  Laterality: N/A;  Diabetic - oral meds   EYE SURGERY Bilateral [phone]   FRACTURE SURGERY Right    as a child/ right arm   HERNIA REPAIR Right 01-13-14   inguinal hernia   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 N/A 02/07/2017   Procedure: 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;  Surgeon: Penne Knee, MD;  Location: [facility];  Service: Urology;  Laterality: N/A;   POLYPECTOMY N/A 03/27/2018   Procedure: POLYPECTOMY;  Surgeon: Unk Corinn Skiff, MD;  Location: [facility];  Service: Endoscopy;  Laterality: N/A;   RETINAL DETACHMENT SURGERY  Jan 2015   TONSILLECTOMY  1955    Medications:  Current Outpatient Medications on File Prior to Visit  Medication Sig   Alpha-Lipoic Acid 50 MG TABS Take by mouth.    aspirin EC 81 MG tablet Take 81 mg by mouth daily. Swallow whole.   Berberine Chloride (BERBERINE HCI PO) Take 600 mg by mouth daily at 2 PM.   Magnesium Salicylate 325 MG TABS Take 1 each by mouth daily.   RESVERATROL PO Take 600 mg by mouth daily.   vitamin B-12 (CYANOCOBALAMIN ) 1000 MCG tablet Take 1,000 mcg by mouth daily.   VITAMIN D PO Take 500 Units by mouth daily.   No current facility-administered medications on file prior to visit.    Allergies:  Allergies  Allergen  Reactions   Acetaminophen Swelling and Other (See Comments)    Swelling of liver    Amlodipine Swelling    Social History:  Social History   Socioeconomic History   Marital status: Married    Spouse name: Not on file   Number of children: Not on file   Years of education: Not on file   Highest education level: Bachelor's degree (e.g., BA, AB, BS)  Occupational History   Not on file  Tobacco Use   Smoking status: Former    Current packs/day: 0.00    Average packs/day: 1 pack/day for 40.0 years (40.0 ttl pk-yrs)    Types: Cigarettes    Start date: 02/21/1971    Quit date: 02/21/1984    Years since quitting: 39.9   Smokeless tobacco: Never  Vaping Use   Vaping status: Never Used  Substance and Sexual Activity   Alcohol use: Yes    Alcohol/week: 1.0 standard drink of alcohol    Types: 1 Cans of beer per week    Comment: wine occasionally    Drug use: Not Currently    Types: Marijuana   Sexual activity: Not Currently  Other Topics Concern   Not on file  Social History Narrative   Working fulltime    Social Drivers of Corporate Investment Banker Strain: Low Risk  (10/10/2023)   Overall Financial Resource Strain (CARDIA)    Difficulty of Paying Living Expenses: Not hard at all  Food Insecurity: No Food Insecurity (10/10/2023)   Hunger Vital Sign    Worried About Running Out of Food in the Last Year: Never true    Ran Out of Food in the Last Year: Never true  Transportation Needs: No  Transportation Needs (10/10/2023)   PRAPARE - Administrator, Civil Service (Medical): No    Lack of Transportation (Non-Medical): No  Physical Activity: Insufficiently Active (10/10/2023)   Exercise Vital Sign    Days of Exercise per Week: 3 days    Minutes of Exercise per Session: 40 min  Stress: No Stress Concern Present (10/10/2023)   [employer] of Occupational Health - Occupational Stress Questionnaire    Feeling of Stress: Not at all  Social Connections: Moderately Isolated (10/10/2023)   Social Connection and Isolation Panel    Frequency of Communication with Friends and Family: More than three times a week    Frequency of Social Gatherings with Friends and Family: Twice a week    Attends Religious Services: Never    Database Administrator or Organizations: No    Attends Engineer, Structural: Not on file    Marital Status: Married  Catering Manager Violence: Not At Risk (01/11/2024)   Humiliation, Afraid, Rape, and Kick questionnaire    Fear of Current or Ex-Partner: No    Emotionally Abused: No    Physically Abused: No    Sexually Abused: No   Social History   Tobacco Use  Smoking Status Former   Current packs/day: 0.00   Average packs/day: 1 pack/day for 40.0 years (40.0 ttl pk-yrs)   Types: Cigarettes   Start date: 02/21/1971   Quit date: 02/21/1984   Years since quitting: 39.9  Smokeless Tobacco Never   Social History   Substance and Sexual Activity  Alcohol Use Yes   Alcohol/week: 1.0 standard drink of alcohol   Types: 1 Cans of beer per week   Comment: wine occasionally     Family History:  Family History  Problem Relation Age of  Onset   Colon cancer Father    Angina Father    Macular degeneration Father    Alzheimer's disease Mother    Prostate cancer Neg Hx    Bladder Cancer Neg Hx    Kidney cancer Neg Hx     Past medical history, surgical history, medications, allergies, family history and social history reviewed with  patient today and changes made to appropriate areas of the chart.   Review of Systems - negative All other ROS negative except what is listed above and in the HPI.      Objective:    BP 114/69 (BP Location: Left Arm, Patient Position: Sitting, Cuff Size: Normal)   Pulse 65   Temp 98.4 F (36.9 C) (Oral)   Resp 15   Ht 6' 0.99 (1.854 m)   Wt 177 lb 6.4 oz (80.5 kg)   SpO2 98%   BMI 23.41 kg/m   Wt Readings from Last 3 Encounters:  01/11/24 177 lb 6.4 oz (80.5 kg)  01/10/24 177 lb 9.6 oz (80.6 kg)  12/26/23 176 lb 3.2 oz (79.9 kg)    Physical Exam Vitals and nursing note reviewed.  Constitutional:      General: He is awake. He is not in acute distress.    Appearance: He is well-developed and well-groomed. He is not ill-appearing.  HENT:     Head: Normocephalic and atraumatic.     Right Ear: Hearing, ear canal and external ear normal. No drainage.     Left Ear: Hearing, ear canal and external ear normal. No drainage.     Nose: Nose normal.     Mouth/Throat:     Pharynx: Uvula midline.  Eyes:     General: Lids are normal.        Right eye: No discharge.        Left eye: No discharge.     Extraocular Movements: Extraocular movements intact.     Conjunctiva/sclera: Conjunctivae normal.     Pupils: Pupils are equal, round, and reactive to light.     Visual Fields: Right eye visual fields normal and left eye visual fields normal.  Neck:     Thyroid : No thyromegaly.     Vascular: No carotid bruit or JVD.     Trachea: Trachea normal.  Cardiovascular:     Rate and Rhythm: Normal rate and regular rhythm.     Heart sounds: Normal heart sounds, S1 normal and S2 normal. No murmur heard.    No gallop.  Pulmonary:     Effort: Pulmonary effort is normal. No accessory muscle usage or respiratory distress.     Breath sounds: Normal breath sounds.  Abdominal:     General: Bowel sounds are normal.     Palpations: Abdomen is soft. There is no hepatomegaly or splenomegaly.      Tenderness: There is no abdominal tenderness.  Musculoskeletal:        General: Normal range of motion.     Cervical back: Normal range of motion and neck supple.     Right lower leg: Edema (trace) present.     Left lower leg: Edema (trace) present.  Lymphadenopathy:     Head:     Right side of head: No submental, submandibular, tonsillar, preauricular or posterior auricular adenopathy.     Left side of head: No submental, submandibular, tonsillar, preauricular or posterior auricular adenopathy.     Cervical: No cervical adenopathy.  Skin:    General: Skin is warm and dry.  Capillary Refill: Capillary refill takes less than 2 seconds.     Findings: No rash.  Neurological:     Mental Status: He is alert and oriented to person, place, and time.     Gait: Gait is intact.     Deep Tendon Reflexes: Reflexes are normal and symmetric.     Reflex Scores:      Brachioradialis reflexes are 2+ on the right side and 2+ on the left side.      Patellar reflexes are 2+ on the right side and 2+ on the left side. Psychiatric:        Attention and Perception: Attention normal.        Mood and Affect: Mood normal.        Speech: Speech normal.        Behavior: Behavior normal. Behavior is cooperative.        Thought Content: Thought content normal.        Cognition and Memory: Cognition normal.        Judgment: Judgment normal.    Diabetic Foot Exam - Simple   Simple Foot Form Visual Inspection No deformities, no ulcerations, no other skin breakdown bilaterally: Yes Sensation Testing See comments: Yes Pulse Check Posterior Tibialis and Dorsalis pulse intact bilaterally: Yes Comments Sensation reduced both feet.       01/11/2024    2:54 PM 12/04/2022    4:31 PM 11/14/2021    9:46 AM 11/12/2020   10:39 AM 11/10/2019   10:36 AM  6CIT Screen  What Year? 0 points 0 points 0 points 0 points 0 points  What month? 0 points 0 points 0 points 0 points 0 points  What time? 0 points 0 points 0  points 0 points 0 points  Count back from 20 0 points 0 points 0 points 0 points 0 points  Months in reverse 0 points 0 points 0 points 0 points 0 points  Repeat phrase 2 points 0 points 0 points 2 points 2 points  Total Score 2 points 0 points 0 points 2 points 2 points   Results for orders placed or performed in visit on 01/08/24  PSA   Collection Time: 01/08/24  3:03 PM  Result Value Ref Range   Prostate Specific Ag, Serum 2.4 0.0 - 4.0 ng/mL      Assessment & Plan:   Problem List Items Addressed This Visit       Cardiovascular and Mediastinum   PVD (peripheral vascular disease)   Suspect some underlying PVD based on exam with diminished lower extremity hair pattern, edema at ankles, and dry skin.  At this time monitor for wounds and continue frequent foot exams.  To notify provider if any wounds present or pain. Vascular assessment was overall reassuring.      Relevant Medications   Magnesium Salicylate 325 MG TABS   rosuvastatin  (CRESTOR ) 10 MG tablet   hydrochlorothiazide  (HYDRODIURIL ) 25 MG tablet   benazepril  (LOTENSIN ) 10 MG tablet   atenolol  (TENORMIN ) 50 MG tablet   Hypertension associated with diabetes (HCC)   Chronic, stable. BP remains on lower side. Urine ALB 22 March 2023.  Continue current medication regimen and adjust as needed. Recommend checking BP a few mornings a week at home and documenting + focus on DASH diet.  Monitor for worsening edema and may provide PRN Lasix  if presents, suspect some underlying PVD based on exam.  Recommend avoid Ibuprofen products and high salt intake.  Return in 3 months.  Labs today: CBC,  CMP, TSH.      Relevant Medications   Magnesium Salicylate 325 MG TABS   rosuvastatin  (CRESTOR ) 10 MG tablet   metFORMIN  (GLUCOPHAGE ) 500 MG tablet   hydrochlorothiazide  (HYDRODIURIL ) 25 MG tablet   benazepril  (LOTENSIN ) 10 MG tablet   atenolol  (TENORMIN ) 50 MG tablet   Other Relevant Orders   Bayer DCA Hb A1c Waived   CBC with  Differential/Platelet   TSH     Endocrine   Type 2 diabetes mellitus with diabetic neuropathy (HCC)   Chronic, ongoing with A1c 6.4% today, trend down from 6.7%.  Urine ALB 30 in January 2025.  Continue Metformin  TID.  Recommend he check BS at least three mornings a week and document.  If elevation above goal A1c next visit then may need to restart Jardiance .  Continue collaboration with neurology, recent notes reviewed.  Return in 3 months. - ACE and statin on board - Eye and foot exams up to date - Vaccinations up to date      Relevant Medications   Magnesium Salicylate 325 MG TABS   rosuvastatin  (CRESTOR ) 10 MG tablet   metFORMIN  (GLUCOPHAGE ) 500 MG tablet   benazepril  (LOTENSIN ) 10 MG tablet   Other Relevant Orders   Bayer DCA Hb A1c Waived   Hyperlipidemia associated with type 2 diabetes mellitus (HCC)   Chronic, ongoing.  Myalgia presenting with daily Atorvastatin  in past.  Continue Rosuvastatin  10 MG but can try weekly dosing due to his concern about muscle loss, educated him on this. Consider Zetia or injectable if any ongoing issues or labs not at goal.      Relevant Medications   Magnesium Salicylate 325 MG TABS   rosuvastatin  (CRESTOR ) 10 MG tablet   metFORMIN  (GLUCOPHAGE ) 500 MG tablet   hydrochlorothiazide  (HYDRODIURIL ) 25 MG tablet   benazepril  (LOTENSIN ) 10 MG tablet   atenolol  (TENORMIN ) 50 MG tablet   Other Relevant Orders   Bayer DCA Hb A1c Waived   Comprehensive metabolic panel with GFR   Lipid Panel w/o Chol/HDL Ratio   Diabetes mellitus treated with oral medication (HCC)   Refer to diabetes with neuropathy plan of care.      Relevant Medications   Magnesium Salicylate 325 MG TABS   rosuvastatin  (CRESTOR ) 10 MG tablet   metFORMIN  (GLUCOPHAGE ) 500 MG tablet   benazepril  (LOTENSIN ) 10 MG tablet   Other Relevant Orders   Bayer DCA Hb A1c Waived     Other   History of prostate cancer   Stable.  Recent visit with urology and is cancer free. Continue  collaboration with urology and PSA with them.      B12 deficiency   Ongoing, stable on recent check.  B12 level up to date and at goal, continue supplement daily.      Relevant Orders   CBC with Differential/Platelet   Other Visit Diagnoses       Encounter for Medicare annual wellness exam    -  Primary   Annual Wellness today.     Encounter for annual physical exam       Annual physical today with labs and health maintenance reviewed, discussed with patient.       Discussed aspirin prophylaxis for myocardial infarction prevention and decision was that we recommended ASA, and patient refused  LABORATORY TESTING:  Health maintenance labs ordered today as discussed above.   The natural history of prostate cancer and ongoing controversy regarding screening and potential treatment outcomes of prostate cancer has been discussed with the patient. The  meaning of a false positive PSA and a false negative PSA has been discussed. He indicates understanding of the limitations of this screening test and wishes to proceed with screening PSA testing -- has performed with urology.  IMMUNIZATIONS:   - Tdap: Tetanus vaccination status reviewed: last tetanus booster within 10 years. - Influenza: Up to date  - Pneumovax: Up to date - Prevnar: Up to date - Zostavax vaccine: Up to date - Covid vaccines = Up To Date  SCREENING: - Colonoscopy: Up to date -- over 75 no longer applicable Discussed with patient purpose of the colonoscopy is to detect colon cancer at curable precancerous or early stages   - AAA Screening: Not applicable  -Hearing Test: Not applicable  -Spirometry: Not applicable   PATIENT COUNSELING:    Sexuality: Discussed sexually transmitted diseases, partner selection, use of condoms, avoidance of unintended pregnancy  and contraceptive alternatives.   Advised to avoid cigarette smoking.  I discussed with the patient that most people either abstain from alcohol or drink  within safe limits (<=14/week and <=4 drinks/occasion for males, <=7/weeks and <= 3 drinks/occasion for females) and that the risk for alcohol disorders and other health effects rises proportionally with the number of drinks per week and how often a drinker exceeds daily limits.  Discussed cessation/primary prevention of drug use and availability of treatment for abuse.   Diet: Encouraged to adjust caloric intake to maintain  or achieve ideal body weight, to reduce intake of dietary saturated fat and total fat, to limit sodium intake by avoiding high sodium foods and not adding table salt, and to maintain adequate dietary potassium and calcium  preferably from fresh fruits, vegetables, and low-fat dairy products.    Stressed the importance of regular exercise  Injury prevention: Discussed safety belts, safety helmets, smoke detector, smoking near bedding or upholstery.   Dental health: Discussed importance of regular tooth brushing, flossing, and dental visits.   Follow up plan: NEXT PREVENTATIVE PHYSICAL DUE IN 1 YEAR. Return in 6 months (on 07/10/2024) for T2DM, HTN/HLD.

## 2024-01-11 NOTE — Assessment & Plan Note (Signed)
 Chronic, ongoing with A1c 6.4% today, trend down from 6.7%.  Urine ALB 30 in January 2025.  Continue Metformin  TID.  Recommend he check BS at least three mornings a week and document.  If elevation above goal A1c next visit then may need to restart Jardiance .  Continue collaboration with neurology, recent notes reviewed.  Return in 3 months. - ACE and statin on board - Eye and foot exams up to date - Vaccinations up to date

## 2024-01-11 NOTE — Assessment & Plan Note (Signed)
 Stable.  Recent visit with urology and is cancer free. Continue collaboration with urology and PSA with them.

## 2024-01-11 NOTE — Assessment & Plan Note (Signed)
 Refer to diabetes with neuropathy plan of care

## 2024-01-12 ENCOUNTER — Ambulatory Visit: Payer: Self-pay | Admitting: Nurse Practitioner

## 2024-01-12 LAB — CBC WITH DIFFERENTIAL/PLATELET
Basophils Absolute: 0 x10E3/uL (ref 0.0–0.2)
Basos: 1 %
EOS (ABSOLUTE): 0.4 x10E3/uL (ref 0.0–0.4)
Eos: 6 %
Hematocrit: 44.8 % (ref 37.5–51.0)
Hemoglobin: 14.2 g/dL (ref 13.0–17.7)
Immature Grans (Abs): 0 x10E3/uL (ref 0.0–0.1)
Immature Granulocytes: 0 %
Lymphocytes Absolute: 1.5 x10E3/uL (ref 0.7–3.1)
Lymphs: 20 %
MCH: 28.7 pg (ref 26.6–33.0)
MCHC: 31.7 g/dL (ref 31.5–35.7)
MCV: 91 fL (ref 79–97)
Monocytes Absolute: 0.5 x10E3/uL (ref 0.1–0.9)
Monocytes: 6 %
Neutrophils Absolute: 5 x10E3/uL (ref 1.4–7.0)
Neutrophils: 67 %
Platelets: 204 x10E3/uL (ref 150–450)
RBC: 4.95 x10E6/uL (ref 4.14–5.80)
RDW: 12.7 % (ref 11.6–15.4)
WBC: 7.5 x10E3/uL (ref 3.4–10.8)

## 2024-01-12 LAB — TSH: TSH: 1.2 u[IU]/mL (ref 0.450–4.500)

## 2024-01-12 LAB — COMPREHENSIVE METABOLIC PANEL WITH GFR
ALT: 18 IU/L (ref 0–44)
AST: 21 IU/L (ref 0–40)
Albumin: 4.4 g/dL (ref 3.8–4.8)
Alkaline Phosphatase: 52 IU/L (ref 47–123)
BUN/Creatinine Ratio: 32 — ABNORMAL HIGH (ref 10–24)
BUN: 23 mg/dL (ref 8–27)
Bilirubin Total: 0.4 mg/dL (ref 0.0–1.2)
CO2: 23 mmol/L (ref 20–29)
Calcium: 9.9 mg/dL (ref 8.6–10.2)
Chloride: 101 mmol/L (ref 96–106)
Creatinine, Ser: 0.72 mg/dL — ABNORMAL LOW (ref 0.76–1.27)
Globulin, Total: 2.4 g/dL (ref 1.5–4.5)
Glucose: 140 mg/dL — ABNORMAL HIGH (ref 70–99)
Potassium: 3.9 mmol/L (ref 3.5–5.2)
Sodium: 141 mmol/L (ref 134–144)
Total Protein: 6.8 g/dL (ref 6.0–8.5)
eGFR: 94 mL/min/1.73 (ref >59)

## 2024-01-12 LAB — LIPID PANEL W/O CHOL/HDL RATIO
Cholesterol, Total: 159 mg/dL (ref 100–199)
HDL: 51 mg/dL (ref >39)
LDL Chol Calc (NIH): 87 mg/dL (ref 0–99)
Triglycerides: 116 mg/dL (ref 0–149)
VLDL Cholesterol Cal: 21 mg/dL (ref 5–40)

## 2024-01-12 NOTE — Progress Notes (Signed)
 Contacted via MyChart  Good evening Tyler Morgan, your labs have returned: - Kidney function, creatinine and eGFR, remains stable, as is liver function, AST and ALT.  - Lipid panel continues to show LDL above goal, would like to see <70. I would recommend adding on Zetia daily with your weekly Rosuvastatin . Do you want to try this? - Remainder of labs stable. Any questions? Keep being amazing!!  Thank you for allowing me to participate in your care.  I appreciate you. Kindest regards, Malissie Musgrave

## 2024-01-15 ENCOUNTER — Ambulatory Visit: Payer: Self-pay | Admitting: Urology

## 2024-03-10 ENCOUNTER — Ambulatory Visit
Admission: RE | Admit: 2024-03-10 | Discharge: 2024-03-10 | Disposition: A | Discharge status: Home / Self Care | Source: Ambulatory Visit | Attending: Neurology | Admitting: Neurology

## 2024-03-10 DIAGNOSIS — R2689 Other abnormalities of gait and mobility: Secondary | ICD-10-CM | POA: Insufficient documentation

## 2024-03-10 DIAGNOSIS — R200 Anesthesia of skin: Secondary | ICD-10-CM | POA: Diagnosis present

## 2024-03-10 DIAGNOSIS — M51369 Other intervertebral disc degeneration, lumbar region without mention of lumbar back pain or lower extremity pain: Secondary | ICD-10-CM | POA: Diagnosis present

## 2024-03-10 DIAGNOSIS — G629 Polyneuropathy, unspecified: Secondary | ICD-10-CM | POA: Diagnosis present

## 2024-03-21 ENCOUNTER — Encounter: Payer: Self-pay | Admitting: Nurse Practitioner

## 2024-07-11 ENCOUNTER — Ambulatory Visit: Admitting: Nurse Practitioner

## 2025-01-01 ENCOUNTER — Other Ambulatory Visit

## 2025-01-08 ENCOUNTER — Ambulatory Visit: Admitting: Urology

## 2025-01-13 ENCOUNTER — Ambulatory Visit
